# Patient Record
Sex: Female | Born: 1950
Health system: Southern US, Community
[De-identification: ages and names within clinical notes are randomized; demographics above are authoritative.]

## PROBLEM LIST (undated history)

## (undated) DIAGNOSIS — I10 Essential (primary) hypertension: Secondary | ICD-10-CM

## (undated) DIAGNOSIS — E785 Hyperlipidemia, unspecified: Secondary | ICD-10-CM

## (undated) DIAGNOSIS — E119 Type 2 diabetes mellitus without complications: Secondary | ICD-10-CM

## (undated) DIAGNOSIS — K219 Gastro-esophageal reflux disease without esophagitis: Secondary | ICD-10-CM

## (undated) DIAGNOSIS — T7840XA Allergy, unspecified, initial encounter: Secondary | ICD-10-CM

## (undated) DIAGNOSIS — J302 Other seasonal allergic rhinitis: Secondary | ICD-10-CM

## (undated) DIAGNOSIS — M1990 Unspecified osteoarthritis, unspecified site: Secondary | ICD-10-CM

## (undated) DIAGNOSIS — G589 Mononeuropathy, unspecified: Secondary | ICD-10-CM

## (undated) DIAGNOSIS — E669 Obesity, unspecified: Secondary | ICD-10-CM

## (undated) DIAGNOSIS — R29898 Other symptoms and signs involving the musculoskeletal system: Secondary | ICD-10-CM

## (undated) DIAGNOSIS — J189 Pneumonia, unspecified organism: Secondary | ICD-10-CM

## (undated) DIAGNOSIS — G4730 Sleep apnea, unspecified: Secondary | ICD-10-CM

## (undated) HISTORY — PX: BREAST SURGERY: SHX581

## (undated) HISTORY — PX: ABDOMINAL HYSTERECTOMY: SHX81

## (undated) HISTORY — DX: Essential (primary) hypertension: I10

## (undated) HISTORY — DX: Hyperlipidemia, unspecified: E78.5

## (undated) HISTORY — DX: Type 2 diabetes mellitus without complications: E11.9

## (undated) HISTORY — PX: OTHER SURGICAL HISTORY: SHX169

## (undated) HISTORY — DX: Obesity, unspecified: E66.9

## (undated) HISTORY — PX: TUBAL LIGATION: SHX77

## (undated) HISTORY — DX: Allergy, unspecified, initial encounter: T78.40XA

## (undated) HISTORY — PX: GANGLION CYST EXCISION: SHX1691

---

## 1995-12-28 DIAGNOSIS — T7840XA Allergy, unspecified, initial encounter: Secondary | ICD-10-CM

## 1995-12-28 HISTORY — DX: Allergy, unspecified, initial encounter: T78.40XA

## 2000-08-16 ENCOUNTER — Other Ambulatory Visit: Admission: RE | Admit: 2000-08-16 | Discharge: 2000-08-16 | Payer: Self-pay | Admitting: Family Medicine

## 2001-03-14 ENCOUNTER — Ambulatory Visit (HOSPITAL_COMMUNITY): Admission: RE | Admit: 2001-03-14 | Discharge: 2001-03-14 | Payer: Self-pay | Admitting: Cardiology

## 2001-03-14 ENCOUNTER — Encounter: Payer: Self-pay | Admitting: Cardiology

## 2001-11-21 ENCOUNTER — Other Ambulatory Visit: Admission: RE | Admit: 2001-11-21 | Discharge: 2001-11-21 | Payer: Self-pay | Admitting: Family Medicine

## 2003-03-26 ENCOUNTER — Other Ambulatory Visit: Admission: RE | Admit: 2003-03-26 | Discharge: 2003-03-26 | Payer: Self-pay | Admitting: Family Medicine

## 2011-04-20 ENCOUNTER — Other Ambulatory Visit: Payer: Self-pay | Admitting: Otolaryngology

## 2011-04-20 DIAGNOSIS — H547 Unspecified visual loss: Secondary | ICD-10-CM

## 2011-04-20 DIAGNOSIS — H9201 Otalgia, right ear: Secondary | ICD-10-CM

## 2011-04-20 DIAGNOSIS — R42 Dizziness and giddiness: Secondary | ICD-10-CM

## 2011-04-29 ENCOUNTER — Ambulatory Visit
Admission: RE | Admit: 2011-04-29 | Discharge: 2011-04-29 | Disposition: A | Discharge status: Home / Self Care | Payer: PRIVATE HEALTH INSURANCE | Source: Ambulatory Visit | Attending: Otolaryngology | Admitting: Otolaryngology

## 2011-04-29 DIAGNOSIS — H547 Unspecified visual loss: Secondary | ICD-10-CM

## 2011-04-29 DIAGNOSIS — R42 Dizziness and giddiness: Secondary | ICD-10-CM

## 2011-04-29 DIAGNOSIS — H9201 Otalgia, right ear: Secondary | ICD-10-CM

## 2011-04-29 MED ORDER — GADOBENATE DIMEGLUMINE 529 MG/ML IV SOLN
20.0000 mL | Freq: Once | INTRAVENOUS | Status: AC | PRN
  Administered 2011-04-29: 20 mL via INTRAVENOUS

## 2013-03-12 ENCOUNTER — Other Ambulatory Visit: Payer: Self-pay | Admitting: *Deleted

## 2013-03-12 DIAGNOSIS — Z780 Asymptomatic menopausal state: Secondary | ICD-10-CM

## 2013-04-05 ENCOUNTER — Encounter: Payer: Self-pay | Admitting: *Deleted

## 2013-05-04 ENCOUNTER — Ambulatory Visit (INDEPENDENT_AMBULATORY_CARE_PROVIDER_SITE_OTHER): Payer: 59 | Admitting: Nurse Practitioner

## 2013-05-04 ENCOUNTER — Encounter: Payer: Self-pay | Admitting: Nurse Practitioner

## 2013-05-04 VITALS — BP 113/76 | HR 74 | Temp 98.3°F | Ht 65.0 in | Wt 232.0 lb

## 2013-05-04 DIAGNOSIS — I10 Essential (primary) hypertension: Secondary | ICD-10-CM | POA: Insufficient documentation

## 2013-05-04 DIAGNOSIS — E785 Hyperlipidemia, unspecified: Secondary | ICD-10-CM

## 2013-05-04 DIAGNOSIS — L909 Atrophic disorder of skin, unspecified: Secondary | ICD-10-CM

## 2013-05-04 MED ORDER — DICLOFENAC SODIUM 75 MG PO TBEC: 75.0000 mg | DELAYED_RELEASE_TABLET | Freq: Two times a day (BID) | ORAL | Status: DC

## 2013-05-04 NOTE — Progress Notes (Signed)
  Subjective:    Patient ID: Bethany [name], female    DOB: 1951-12-07, 62 y.o.   MRN: [id number]  HPI- Patient here today for skin tag removal.One is in private parts and another on her breast. Patient noticed tags in December- One in groin area has gotten bigger an dgets caught in panty line.     Review of Systems  All other systems reviewed and are negative.       Objective:   Physical Exam  Genitourinary:     Skin tag left groin area   Skin:     Skin tag right aerolla    Procedure LIdo 1% with epi 1cc each area Skin tags shaved off Silver nitrate sticks for cauterization      Assessment & Plan:  removal of skin tag X2-- Right breat and Left groin  Keep areas clean and dry  Watch for signs of infection.   Arthritis  diclofenaxc as Rx  Mary-Margaret Daphine Deutscher, FNP

## 2013-05-23 ENCOUNTER — Other Ambulatory Visit: Payer: Self-pay

## 2013-05-23 ENCOUNTER — Ambulatory Visit: Payer: Self-pay

## 2013-06-09 ENCOUNTER — Other Ambulatory Visit: Payer: Self-pay | Admitting: *Deleted

## 2013-06-09 MED ORDER — MECLIZINE HCL 25 MG PO TABS: 25.0000 mg | ORAL_TABLET | Freq: Four times a day (QID) | ORAL | Status: DC | PRN

## 2013-06-20 ENCOUNTER — Ambulatory Visit (INDEPENDENT_AMBULATORY_CARE_PROVIDER_SITE_OTHER): Payer: 59

## 2013-06-20 ENCOUNTER — Ambulatory Visit (INDEPENDENT_AMBULATORY_CARE_PROVIDER_SITE_OTHER): Payer: 59 | Admitting: Pharmacist

## 2013-06-20 VITALS — Ht 65.0 in | Wt 234.0 lb

## 2013-06-20 DIAGNOSIS — Z13820 Encounter for screening for osteoporosis: Secondary | ICD-10-CM

## 2013-06-20 DIAGNOSIS — Z780 Asymptomatic menopausal state: Secondary | ICD-10-CM

## 2013-06-20 NOTE — Patient Instructions (Addendum)

## 2013-06-20 NOTE — Progress Notes (Signed)
Patient ID: Bethany [name], female   DOB: 15-Aug-1951, 62 y.o.   MRN: [id number] [facility] Current Height: Height: 5\' 5"  (165.1 cm)      Max Lifetime Height:  5\' 5"  Current Weight: Weight: 234 lb (106.142 kg)       Ethnicity:African American     HPI: Does pt already have a diagnosis of:  Osteopenia?  No Osteoporosis?  No  Back Pain?  Yes  - after accident 53.  Currently see chiropractor with good results    Kyphosis?  No Prior fracture?  No Med(s) for Osteoporosis/Osteopenia:  none Med(s) previously tried for Osteoporosis/Osteopenia:  none                                                             PMH: Age at menopause:  21's Hysterectomy?  Yes Oophorectomy?  No HRT? Yes - Former.  Type/duration: premarin - several years Steroid Use?  No Thyroid med?  No History of cancer?  No History of digestive disorders (ie Crohn's)?  No Current or previous eating disorders?  No Last Vitamin D Result:  24 (01/2013) Last GFR Result:  78 (01/2013)   FH/SH: Family history of osteoporosis?  No Parent with history of hip fracture?  No Family history of breast cancer?  No Exercise?  No Smoking?  No Alcohol?  No    Calcium Assessment Calcium Intake  # of servings/day  Calcium mg  Milk (8 oz) QOD  x  300  = 150  Yogurt (4 oz) 0 x  200 = 0  Cheese (1 oz) 1 x  200 = 200  Other Calcium sources   250mg   Ca supplement 0 = 0   Estimated calcium intake per day 600mg     DEXA Results Date of Test T-Score for AP Spine L1-L4 T-Score for Total Left Hip T-Score for Total Right Hip  06/20/2013 1.5 0.8 0.9  05/30/2006 0.6 1.2 1.1              Assessment: Normal BMD   Recommendations: 1.  Start  OTC calcium + vitamin D 600mg /800IU daily and continue current dietary calcium intake 2.  recommend weight bearing exercise - as able or after approved by PCP.  Patient to be evaluated for knee pain/osteoarthritis by Dr Modesto Charon 07/02/2013 4.  Counseled and educated about fall risk and  prevention.  Recheck DEXA:  3 years  Time spent counseling patient:  15 minutes

## 2013-07-02 ENCOUNTER — Encounter: Payer: Self-pay | Admitting: Family Medicine

## 2013-07-02 ENCOUNTER — Other Ambulatory Visit: Payer: 59

## 2013-07-02 ENCOUNTER — Ambulatory Visit (INDEPENDENT_AMBULATORY_CARE_PROVIDER_SITE_OTHER): Payer: 59 | Admitting: Family Medicine

## 2013-07-02 ENCOUNTER — Ambulatory Visit (INDEPENDENT_AMBULATORY_CARE_PROVIDER_SITE_OTHER): Payer: 59

## 2013-07-02 VITALS — BP 132/81 | HR 69 | Temp 98.1°F | Wt 236.4 lb

## 2013-07-02 DIAGNOSIS — M1990 Unspecified osteoarthritis, unspecified site: Secondary | ICD-10-CM

## 2013-07-02 DIAGNOSIS — I10 Essential (primary) hypertension: Secondary | ICD-10-CM

## 2013-07-02 DIAGNOSIS — M21169 Varus deformity, not elsewhere classified, unspecified knee: Secondary | ICD-10-CM

## 2013-07-02 DIAGNOSIS — E559 Vitamin D deficiency, unspecified: Secondary | ICD-10-CM

## 2013-07-02 DIAGNOSIS — M129 Arthropathy, unspecified: Secondary | ICD-10-CM

## 2013-07-02 DIAGNOSIS — E785 Hyperlipidemia, unspecified: Secondary | ICD-10-CM

## 2013-07-02 DIAGNOSIS — R682 Dry mouth, unspecified: Secondary | ICD-10-CM

## 2013-07-02 DIAGNOSIS — K117 Disturbances of salivary secretion: Secondary | ICD-10-CM

## 2013-07-02 DIAGNOSIS — R635 Abnormal weight gain: Secondary | ICD-10-CM

## 2013-07-02 DIAGNOSIS — H8110 Benign paroxysmal vertigo, unspecified ear: Secondary | ICD-10-CM

## 2013-07-02 DIAGNOSIS — E669 Obesity, unspecified: Secondary | ICD-10-CM | POA: Insufficient documentation

## 2013-07-02 LAB — COMPLETE METABOLIC PANEL WITH GFR
ALT: 20 U/L (ref 0–35)
AST: 23 U/L (ref 0–37)
Albumin: 4 g/dL (ref 3.5–5.2)
Alkaline Phosphatase: 73 U/L (ref 39–117)
BUN: 10 mg/dL (ref 6–23)
CO2: 27 mEq/L (ref 19–32)
Calcium: 9.2 mg/dL (ref 8.4–10.5)
Chloride: 108 mEq/L (ref 96–112)
Creat: 0.94 mg/dL (ref 0.50–1.10)
GFR, Est African American: 75 mL/min
GFR, Est Non African American: 65 mL/min
Glucose, Bld: 105 mg/dL — ABNORMAL HIGH (ref 70–99)
Potassium: 4.4 mEq/L (ref 3.5–5.3)
Sodium: 143 mEq/L (ref 135–145)
Total Bilirubin: 0.6 mg/dL (ref 0.3–1.2)
Total Protein: 6.9 g/dL (ref 6.0–8.3)

## 2013-07-02 MED ORDER — CELECOXIB 200 MG PO CAPS: 200.0000 mg | ORAL_CAPSULE | Freq: Every day | ORAL | Status: DC

## 2013-07-02 NOTE — Progress Notes (Signed)
Patient ID: Bethany [name], female   DOB: July 10, 1951, 62 y.o.   MRN: [id number] SUBJECTIVE: CC: Chief Complaint  Patient presents with  . Knee Pain    bilateral knee pain. states has osteoarthritis. can't tolerate diclofencac causes heartburn  . Medication Problem    pravastatitn causes dry mouth and corners in mouth crack open     HPI: Arthritits of the knees has been for years and causes indigestion. Having less smell and taste. More difficult.Dry mouth. Recurrent vertigo can frequentl. Last attack of vertigo was a month ago. No neuro deficit.  Patient is here for follow up of hyperlipidemia: denies Headache;denies Chest Pain;denies weakness;denies Shortness of Breath and orthopnea;denies Visual changes;denies palpitations;denies cough;denies pedal edema;denies symptoms of TIA or stroke;deniesClaudication symptoms. admits to Compliance with medications;  Problems with medications.Thinks the Pravastatin causes dry mouth.  Past Medical History  Diagnosis Date  . Hypertension   . Hyperlipidemia   . Allergy 1997    knees  . Obesity    Past Surgical History  Procedure Laterality Date  . Abdominal hysterectomy    . Tubal ligation    . Phlebectomy     History   Social History  . Marital Status: Married    Spouse Name: N/A    Number of Children: N/A  . Years of Education: N/A   Occupational History  . Not on file.   Social History Main Topics  . Smoking status: Never Smoker   . Smokeless tobacco: Not on file  . Alcohol Use: No  . Drug Use: No  . Sexually Active: Not Currently     Comment: husband not able to have  sex   Other Topics Concern  . Not on file   Social History Narrative  . No narrative on file   Family History  Problem Relation Age of Onset  . Heart disease Mother   . Diabetes Sister   . Arthritis Brother     knees  . Heart disease Sister     tumor in the heart   Current Outpatient Prescriptions on File Prior to Visit  Medication Sig Dispense  Refill  . acyclovir (ZOVIRAX) 200 MG capsule Take 200 mg by mouth daily as needed.      Marland Kitchen lisinopril-hydrochlorothiazide (PRINZIDE,ZESTORETIC) 20-25 MG per tablet Take 1 tablet by mouth daily.      . meclizine (ANTIVERT) 25 MG tablet Take 1 tablet (25 mg total) by mouth 4 (four) times daily as needed.  90 tablet  0  . pravastatin (PRAVACHOL) 20 MG tablet Take 20 mg by mouth daily.      . diclofenac (VOLTAREN) 75 MG EC tablet Take 1 tablet (75 mg total) by mouth 2 (two) times daily.  60 tablet  0   No current facility-administered medications on file prior to visit.   Allergies  Allergen Reactions  . Diclofenac     Causes heartburn     There is no immunization history on file for this patient. Prior to Admission medications   Medication Sig Start Date End Date Taking? Authorizing Provider  acyclovir (ZOVIRAX) 200 MG capsule Take 200 mg by mouth daily as needed.   Yes Historical Provider, MD  lisinopril-hydrochlorothiazide (PRINZIDE,ZESTORETIC) 20-25 MG per tablet Take 1 tablet by mouth daily.   Yes Historical Provider, MD  meclizine (ANTIVERT) 25 MG tablet Take 1 tablet (25 mg total) by mouth 4 (four) times daily as needed. 06/09/13  Yes Mary-Margaret Daphine Deutscher, FNP  pravastatin (PRAVACHOL) 20 MG tablet Take 20 mg by mouth daily.  Yes Historical Provider, MD  diclofenac (VOLTAREN) 75 MG EC tablet Take 1 tablet (75 mg total) by mouth 2 (two) times daily. 05/04/13   Mary-Margaret Daphine Deutscher, FNP     ROS: As above in the HPI. All other systems are stable or negative.  OBJECTIVE: APPEARANCE:  Patient in no acute distress.The patient appeared well nourished and normally developed. Acyanotic. Waist:46 inches VITAL SIGNS:BP 132/81  Pulse 69  Temp(Src) 98.1 F (36.7 C) (Oral)  Wt 236 lb 6.4 oz (107.23 kg)  BMI 39.34 kg/m2 Obese AAF  SKIN: warm and  Dry without overt rashes, tattoos and scars  HEAD and Neck: without JVD, Head and scalp: normal Eyes:No scleral icterus. Fundi normal, eye  movements normal. Ears: Auricle normal, canal normal, Tympanic membranes normal, insufflation normal. Nose: normal Throat: normal Neck & thyroid: normal  CHEST & LUNGS: Chest wall: normal Lungs: Clear  CVS: Reveals the PMI to be normally located. Regular rhythm, First and Second Heart sounds are normal,  absence of murmurs, rubs or gallops. Peripheral vasculature: Radial pulses: normal Dorsal pedis pulses: normal Posterior pulses: normal  ABDOMEN:  Appearance: normal Benign, no organomegaly, no masses, no Abdominal Aortic enlargement. No Guarding , no rebound. No Bruits. Bowel sounds: normal  RECTAL: N/A GU: N/A  EXTREMETIES: nonedematous.   MUSCULOSKELETAL:  Spine: normal Joints:Bilateral crepitus of knees with mild pain on ROM. Pain on weight bearing Genu Varus/Bow legs.  NEUROLOGIC: oriented to time,place and person; nonfocal. Strength is normal Sensory is normal Reflexes are normal Cranial Nerves are normal.  ASSESSMENT: Arthritis - Knees - Plan: Arthritis Panel, celecoxib (CELEBREX) 200 MG capsule, Ambulatory referral to Orthopedic Surgery, DG Knee 1-2 Views Left, DG Knee 1-2 Views Right  Obesity, unspecified  Hypertension - Plan: COMPLETE METABOLIC PANEL WITH GFR  Hyperlipidemia - Plan: COMPLETE METABOLIC PANEL WITH GFR, NMR Lipoprofile with Lipids  Mouth dryness  Genu varus, unspecified laterality  Unspecified vitamin D deficiency - Plan: Vitamin D 25 hydroxy  Benign paroxysmal positional vertigo - Plan: Ambulatory referral to ENT  Suspect the dry mouth is due to the meclizine. Will hold off changing the statin for now. The Genu Varus and the obesity has played a major input on the arthritis.   PLAN:      Dr Woodroe Mode Recommendations  Diet and Exercise discussed with patient.  For nutrition information, I recommend books:  1).Eat to Live by Dr Monico Hoar. 2).Prevent and Reverse Heart Disease by Dr Suzzette Righter. 3) Dr Katherina Right Book:  Program to Reverse Diabetes  Exercise recommendations are:  If unable to walk, then the patient can exercise in a chair 3 times a day. By flapping arms like a bird gently and raising legs outwards to the front.  If ambulatory, the patient can go for walks for 30 minutes 3 times a week. Then increase the intensity and duration as tolerated.  Goal is to try to attain exercise frequency to 5 times a week.  If applicable: Best to perform resistance exercises (machines or weights) 2 days a week and cardio type exercises 3 days per week.   Weight reduction and exercise ina  Chair discussed.   [facility] reading (PRIMARY) by  Dr. Modesto Charon: Bilateral knee Xrays:severe degenerative  Arthritis with loss of the medial compartments bilaterally. Await official overrread.  Orders Placed This Encounter  Procedures  . DG Knee 1-2 Views Left    Standing Status: Future     Number of Occurrences:      Standing Expiration Date: 09/01/2014  Order Specific Question:  Reason for Exam (SYMPTOM  OR DIAGNOSIS REQUIRED)    Answer:  knee pains    Order Specific Question:  Preferred imaging location?    Answer:  Internal  . DG Knee 1-2 Views Right    Standing Status: Future     Number of Occurrences:      Standing Expiration Date: 09/01/2014    Order Specific Question:  Reason for Exam (SYMPTOM  OR DIAGNOSIS REQUIRED)    Answer:  knee pains    Order Specific Question:  Preferred imaging location?    Answer:  Internal  . Arthritis Panel  . COMPLETE METABOLIC PANEL WITH GFR  . NMR Lipoprofile with Lipids  . Vitamin D 25 hydroxy  . Ambulatory referral to Orthopedic Surgery    Referral Priority:  Routine    Referral Type:  Surgical    Referral Reason:  Specialty Services Required    Requested Specialty:  Orthopedic Surgery    Number of Visits Requested:  1  . Ambulatory referral to ENT    Referral Priority:  Routine    Referral Type:  Consultation    Referral Reason:  Specialty Services Required     Requested Specialty:  Otolaryngology    Number of Visits Requested:  1    Meds ordered this encounter  Medications  . celecoxib (CELEBREX) 200 MG capsule    Sig: Take 1 capsule (200 mg total) by mouth daily.    Dispense:  30 capsule    Refill:  2   Return in about 6 weeks (around 08/13/2013) for recheck BP, Recheck medical problems.  Marcellus Pulliam P. Modesto Charon, M.D.

## 2013-07-02 NOTE — Patient Instructions (Signed)
      Dr Julieta Rogalski's Recommendations  Diet and Exercise discussed with patient.  For nutrition information, I recommend books:  1).Eat to Live by Dr Joel Fuhrman. 2).Prevent and Reverse Heart Disease by Dr Caldwell Esselstyn. 3) Dr Neal Barnard's Book:  Program to Reverse Diabetes  Exercise recommendations are:  If unable to walk, then the patient can exercise in a chair 3 times a day. By flapping arms like a bird gently and raising legs outwards to the front.  If ambulatory, the patient can go for walks for 30 minutes 3 times a week. Then increase the intensity and duration as tolerated.  Goal is to try to attain exercise frequency to 5 times a week.  If applicable: Best to perform resistance exercises (machines or weights) 2 days a week and cardio type exercises 3 days per week.  

## 2013-07-03 ENCOUNTER — Other Ambulatory Visit: Payer: Self-pay | Admitting: Family Medicine

## 2013-07-03 DIAGNOSIS — E785 Hyperlipidemia, unspecified: Secondary | ICD-10-CM

## 2013-07-03 LAB — NMR LIPOPROFILE WITH LIPIDS
Cholesterol, Total: 180 mg/dL (ref <200)
HDL Particle Number: 30.9 umol/L (ref >=30.5)
HDL Size: 8.9 nm — ABNORMAL LOW (ref >=9.2)
HDL-C: 40 mg/dL (ref >=40)
LDL (calc): 119 mg/dL — ABNORMAL HIGH (ref <100)
LDL Particle Number: 1601 nmol/L — ABNORMAL HIGH (ref <1000)
LDL Size: 20 nm — ABNORMAL LOW (ref >20.5)
LP-IR Score: 69 — ABNORMAL HIGH (ref <=45)
Large HDL-P: 3 umol/L — ABNORMAL LOW (ref >=4.8)
Large VLDL-P: 3.8 nmol/L — ABNORMAL HIGH (ref <=2.7)
Small LDL Particle Number: 1027 nmol/L — ABNORMAL HIGH (ref <=527)
Triglycerides: 105 mg/dL (ref <150)
VLDL Size: 52.9 nm — ABNORMAL HIGH (ref <=46.6)

## 2013-07-03 LAB — ARTHRITIS PANEL
Anti Nuclear Antibody(ANA): POSITIVE — AB
Rhuematoid fact SerPl-aCnc: <10 IU/mL (ref <=14)
Sed Rate: 4 mm/hr (ref 0–22)
Uric Acid, Serum: 4.6 mg/dL (ref 2.4–6.0)

## 2013-07-03 LAB — ANTI-NUCLEAR AB-TITER (ANA TITER): ANA Titer 1: NEGATIVE

## 2013-07-03 LAB — VITAMIN D 25 HYDROXY (VIT D DEFICIENCY, FRACTURES): Vit D, 25-Hydroxy: 47 ng/mL (ref 30–89)

## 2013-07-03 MED ORDER — PRAVASTATIN SODIUM 40 MG PO TABS: 40.0000 mg | ORAL_TABLET | Freq: Every day | ORAL | Status: DC

## 2013-07-03 NOTE — Progress Notes (Signed)
Quick Note:  Labs abnormal. The Xrays of the knees are showing very advanced arthritis. See the orthopedist as we had planned and the referral was made. Thanks.  ______

## 2013-07-04 ENCOUNTER — Encounter: Payer: Self-pay | Admitting: *Deleted

## 2013-07-25 ENCOUNTER — Ambulatory Visit: Payer: 59 | Admitting: Family Medicine

## 2013-08-15 ENCOUNTER — Ambulatory Visit: Payer: 59 | Admitting: Family Medicine

## 2013-08-17 ENCOUNTER — Ambulatory Visit (INDEPENDENT_AMBULATORY_CARE_PROVIDER_SITE_OTHER): Payer: 59 | Admitting: Family Medicine

## 2013-08-17 ENCOUNTER — Encounter: Payer: Self-pay | Admitting: Family Medicine

## 2013-08-17 VITALS — BP 126/89 | HR 70 | Temp 98.4°F | Wt 236.4 lb

## 2013-08-17 DIAGNOSIS — I10 Essential (primary) hypertension: Secondary | ICD-10-CM

## 2013-08-17 DIAGNOSIS — M1990 Unspecified osteoarthritis, unspecified site: Secondary | ICD-10-CM

## 2013-08-17 DIAGNOSIS — R635 Abnormal weight gain: Secondary | ICD-10-CM

## 2013-08-17 DIAGNOSIS — E785 Hyperlipidemia, unspecified: Secondary | ICD-10-CM

## 2013-08-17 DIAGNOSIS — E669 Obesity, unspecified: Secondary | ICD-10-CM

## 2013-08-17 DIAGNOSIS — M129 Arthropathy, unspecified: Secondary | ICD-10-CM

## 2013-08-17 NOTE — Progress Notes (Signed)
Patient ID: Bethany [name], female   DOB: September 25, 1951, 62 y.o.   MRN: [id number] SUBJECTIVE: CC: Chief Complaint  Patient presents with  . Follow-up    follow up bilateral knee pain to habe surgery bilaterl knee on oct 27. by dr Despina Hick    HPI:  Patient is here for follow up of hyperlipidemia/hypertension/ arthritis of the knees. denies Headache;denies Chest Pain;denies weakness;denies Shortness of Breath and orthopnea;denies Visual changes;denies palpitations;denies cough;denies pedal edema;denies symptoms of TIA or stroke;deniesClaudication symptoms. admits to Compliance with medications; denies Problems with medications.  celebrex helps a lot.   Scheduled for TKRs in October.  Past Medical History  Diagnosis Date  . Hypertension   . Hyperlipidemia   . Allergy 1997    knees  . Obesity    Past Surgical History  Procedure Laterality Date  . Abdominal hysterectomy    . Tubal ligation    . Phlebectomy     History   Social History  . Marital Status: Married    Spouse Name: N/A    Number of Children: N/A  . Years of Education: N/A   Occupational History  . Not on file.   Social History Main Topics  . Smoking status: Never Smoker   . Smokeless tobacco: Not on file  . Alcohol Use: No  . Drug Use: No  . Sexual Activity: Not Currently     Comment: husband not able to have  sex   Other Topics Concern  . Not on file   Social History Narrative  . No narrative on file   Family History  Problem Relation Age of Onset  . Heart disease Mother   . Diabetes Sister   . Arthritis Brother     knees  . Heart disease Sister     tumor in the heart   Current Outpatient Prescriptions on File Prior to Visit  Medication Sig Dispense Refill  . acyclovir (ZOVIRAX) 200 MG capsule Take 200 mg by mouth daily as needed.      . celecoxib (CELEBREX) 200 MG capsule Take 1 capsule (200 mg total) by mouth daily.  30 capsule  2  . lisinopril-hydrochlorothiazide (PRINZIDE,ZESTORETIC) 20-25  MG per tablet Take 1 tablet by mouth daily.      . meclizine (ANTIVERT) 25 MG tablet Take 1 tablet (25 mg total) by mouth 4 (four) times daily as needed.  90 tablet  0  . pravastatin (PRAVACHOL) 40 MG tablet Take 1 tablet (40 mg total) by mouth daily.  90 tablet  3   No current facility-administered medications on file prior to visit.   Allergies  Allergen Reactions  . Diclofenac     Causes heartburn     There is no immunization history on file for this patient. Prior to Admission medications   Medication Sig Start Date End Date Taking? Authorizing Provider  acyclovir (ZOVIRAX) 200 MG capsule Take 200 mg by mouth daily as needed.    Historical Provider, MD  celecoxib (CELEBREX) 200 MG capsule Take 1 capsule (200 mg total) by mouth daily. 07/02/13   Ileana Ladd, MD  lisinopril-hydrochlorothiazide (PRINZIDE,ZESTORETIC) 20-25 MG per tablet Take 1 tablet by mouth daily.    Historical Provider, MD  meclizine (ANTIVERT) 25 MG tablet Take 1 tablet (25 mg total) by mouth 4 (four) times daily as needed. 06/09/13   Mary-Margaret Daphine Deutscher, FNP  pravastatin (PRAVACHOL) 40 MG tablet Take 1 tablet (40 mg total) by mouth daily. 07/03/13   Ileana Ladd, MD  ROS: As above in the HPI. All other systems are stable or negative.  OBJECTIVE: APPEARANCE:  Patient in no acute distress.The patient appeared well nourished and normally developed. Acyanotic. Waist: VITAL SIGNS:BP 126/89  Pulse 70  Temp(Src) 98.4 F (36.9 C) (Oral)  Wt 236 lb 6.4 oz (107.23 kg)  BMI 39.34 kg/m2 AAF obese  SKIN: warm and  Dry without overt rashes, tattoos and scars  HEAD and Neck: without JVD, Head and scalp: normal Eyes:No scleral icterus. Fundi normal, eye movements normal. Ears: Auricle normal, canal normal, Tympanic membranes normal, insufflation normal. Nose: normal Throat: normal Neck & thyroid: normal  CHEST & LUNGS: Chest wall: normal Lungs: Clear  CVS: Reveals the PMI to be normally  located. Regular rhythm, First and Second Heart sounds are normal,  absence of murmurs, rubs or gallops. Peripheral vasculature: Radial pulses: normal Dorsal pedis pulses: normal Posterior pulses: normal  ABDOMEN:  Appearance: Obese Benign, no organomegaly, no masses, no Abdominal Aortic enlargement. No Guarding , no rebound. No Bruits. Bowel sounds: normal  RECTAL: N/A GU: N/A  EXTREMETIES: nonedematous.  MUSCULOSKELETAL:  Spine: normal Joints: bilateral knee crepitus and reduced ROM. Patient limps to walk  NEUROLOGIC: oriented to time,place and person; nonfocal. Strength is normal Sensory is normal Reflexes are normal Cranial Nerves are normal.  Results for orders placed in visit on 07/02/13  ARTHRITIS PANEL      Result Value Range   Sed Rate 4  0 - 22 mm/hr   Uric Acid, Serum 4.6  2.4 - 6.0 mg/dL   Rheumatoid Factor <45  <=14 IU/mL   ANA POS (*) NEGATIVE  COMPLETE METABOLIC PANEL WITH GFR      Result Value Range   Sodium 143  135 - 145 mEq/L   Potassium 4.4  3.5 - 5.3 mEq/L   Chloride 108  96 - 112 mEq/L   CO2 27  19 - 32 mEq/L   Glucose, Bld 105 (*) 70 - 99 mg/dL   BUN 10  6 - 23 mg/dL   Creat 4.09  8.11 - 9.14 mg/dL   Total Bilirubin 0.6  0.3 - 1.2 mg/dL   Alkaline Phosphatase 73  39 - 117 U/L   AST 23  0 - 37 U/L   ALT 20  0 - 35 U/L   Total Protein 6.9  6.0 - 8.3 g/dL   Albumin 4.0  3.5 - 5.2 g/dL   Calcium 9.2  8.4 - 78.2 mg/dL   GFR, Est African American 75     GFR, Est Non African American 65    NMR LIPOPROFILE WITH LIPIDS      Result Value Range   LDL Particle Number [id number] (*) <1000 nmol/L   LDL (calc) 119 (*) <100 mg/dL   HDL-C 40  >=95 mg/dL   Triglycerides 621  <308 mg/dL   Cholesterol, Total 657  <200 mg/dL   HDL Particle Number 84.6  >=96.2 umol/L   Large HDL-P 3.0 (*) >=4.8 umol/L   Large VLDL-P 3.8 (*) <=2.7 nmol/L   Small LDL Particle Number [id number] (*) <=527 nmol/L   LDL Size 20.0 (*) >20.5 nm   HDL Size 8.9 (*) >=9.2 nm   VLDL Size  52.9 (*) <=46.6 nm   LP-IR Score 69 (*) <=45  VITAMIN D 25 HYDROXY      Result Value Range   Vit D, 25-Hydroxy 47  30 - 89 ng/mL  ANTI-NUCLEAR AB-TITER (ANA TITER)      Result Value Range   ANA Titer  1 NEG  <1:40     ANA Pattern 1        ASSESSMENT: Hyperlipidemia  Hypertension - Plan: CMP14+EGFR  Obesity, unspecified  Arthritis - Plan: CMP14+EGFR  Better with celebrex. Tolerating the statin without problems.  PLAN: Orders Placed This Encounter  Procedures  . CMP14+EGFR   Patient doing better.  Patient to start on the Eat to Live program.  Return in about 4 weeks (around 09/14/2013) for pre - op clearance exam.  Anvita Hirata P. Modesto Charon, M.D.

## 2013-08-18 LAB — CMP14+EGFR
ALT: 12 IU/L (ref 0–32)
AST: 18 IU/L (ref 0–40)
Albumin/Globulin Ratio: 1.5 (ref 1.1–2.5)
Albumin: 4.1 g/dL (ref 3.6–4.8)
Alkaline Phosphatase: 76 IU/L (ref 39–117)
BUN/Creatinine Ratio: 11 (ref 11–26)
BUN: 9 mg/dL (ref 8–27)
CO2: 24 mmol/L (ref 18–29)
Calcium: 9.9 mg/dL (ref 8.6–10.2)
Chloride: 104 mmol/L (ref 97–108)
Creatinine, Ser: 0.81 mg/dL (ref 0.57–1.00)
GFR calc Af Amer: 89 mL/min/{1.73_m2} (ref >59)
GFR calc non Af Amer: 78 mL/min/{1.73_m2} (ref >59)
Globulin, Total: 2.7 g/dL (ref 1.5–4.5)
Glucose: 79 mg/dL (ref 65–99)
Potassium: 4.3 mmol/L (ref 3.5–5.2)
Sodium: 141 mmol/L (ref 134–144)
Total Bilirubin: 0.6 mg/dL (ref 0.0–1.2)
Total Protein: 6.8 g/dL (ref 6.0–8.5)

## 2013-08-20 NOTE — Progress Notes (Signed)
Quick Note:  Call patient. Labs normal. No change in plan. ______ 

## 2013-09-11 ENCOUNTER — Other Ambulatory Visit: Payer: Self-pay

## 2013-09-11 NOTE — Telephone Encounter (Signed)
Last seen 08/17/13  [facility]  Diclofenac not on EPIC list

## 2013-09-12 NOTE — Telephone Encounter (Signed)
Was Rx celebrex. Not on our med list in EPIC. Where does she get this? Who Rx it?

## 2013-09-13 NOTE — Telephone Encounter (Signed)
Pt notified and has appt tomorrow  09-14-13

## 2013-09-14 ENCOUNTER — Ambulatory Visit (INDEPENDENT_AMBULATORY_CARE_PROVIDER_SITE_OTHER): Payer: 59 | Admitting: Family Medicine

## 2013-09-14 ENCOUNTER — Ambulatory Visit (INDEPENDENT_AMBULATORY_CARE_PROVIDER_SITE_OTHER): Payer: 59

## 2013-09-14 ENCOUNTER — Encounter: Payer: Self-pay | Admitting: Family Medicine

## 2013-09-14 VITALS — BP 137/85 | HR 71 | Temp 97.6°F | Wt 235.8 lb

## 2013-09-14 DIAGNOSIS — I10 Essential (primary) hypertension: Secondary | ICD-10-CM

## 2013-09-14 DIAGNOSIS — M25579 Pain in unspecified ankle and joints of unspecified foot: Secondary | ICD-10-CM

## 2013-09-14 DIAGNOSIS — M25572 Pain in left ankle and joints of left foot: Secondary | ICD-10-CM

## 2013-09-14 DIAGNOSIS — M1990 Unspecified osteoarthritis, unspecified site: Secondary | ICD-10-CM

## 2013-09-14 DIAGNOSIS — R635 Abnormal weight gain: Secondary | ICD-10-CM

## 2013-09-14 DIAGNOSIS — R42 Dizziness and giddiness: Secondary | ICD-10-CM

## 2013-09-14 DIAGNOSIS — E785 Hyperlipidemia, unspecified: Secondary | ICD-10-CM

## 2013-09-14 DIAGNOSIS — M129 Arthropathy, unspecified: Secondary | ICD-10-CM

## 2013-09-14 DIAGNOSIS — E669 Obesity, unspecified: Secondary | ICD-10-CM

## 2013-09-14 DIAGNOSIS — M21169 Varus deformity, not elsewhere classified, unspecified knee: Secondary | ICD-10-CM

## 2013-09-14 DIAGNOSIS — H8110 Benign paroxysmal vertigo, unspecified ear: Secondary | ICD-10-CM

## 2013-09-14 MED ORDER — MECLIZINE HCL 25 MG PO TABS: 25.0000 mg | ORAL_TABLET | Freq: Four times a day (QID) | ORAL | Status: DC | PRN

## 2013-09-14 NOTE — Progress Notes (Signed)
Patient ID: Bethany [name], female   DOB: [birth year], 62 y.o.   MRN: [id number] SUBJECTIVE: CC: Chief Complaint  Patient presents with  . Follow-up    inner ear trouble . pulling in rt groin area top left foot has knot that is painful  . Medication Refill    can't afford celebrex     HPI: Patient is here for follow up of hyperlipidemia/htn/: denies Headache;denies Chest Pain;denies weakness;denies Shortness of Breath and orthopnea;denies Visual changes;denies palpitations;denies cough;denies pedal edema;denies symptoms of TIA or stroke;deniesClaudication symptoms. admits to Compliance with medications; denies Problems with medications.  Tinnitus and dizziness.flare up of her vertigo  Left Foot flared up of arthritis with a swelling over the fourth Tarso metatarsal joint.  Past Medical History  Diagnosis Date  . Hypertension   . Hyperlipidemia   . Allergy 1997    knees  . Obesity    Past Surgical History  Procedure Laterality Date  . Abdominal hysterectomy    . Tubal ligation    . Phlebectomy     History   Social History  . Marital Status: Married    Spouse Name: N/A    Number of Children: N/A  . Years of Education: N/A   Occupational History  . Not on file.   Social History Main Topics  . Smoking status: Never Smoker   . Smokeless tobacco: Not on file  . Alcohol Use: No  . Drug Use: No  . Sexual Activity: Not Currently     Comment: husband not able to have  sex   Other Topics Concern  . Not on file   Social History Narrative  . No narrative on file   Family History  Problem Relation Age of Onset  . Heart disease Mother   . Diabetes Sister   . Arthritis Brother     knees  . Heart disease Sister     tumor in the heart   Current Outpatient Prescriptions on File Prior to Visit  Medication Sig Dispense Refill  . acyclovir (ZOVIRAX) 200 MG capsule Take 200 mg by mouth daily as needed.      . celecoxib (CELEBREX) 200 MG capsule Take 1 capsule (200 mg  total) by mouth daily.  30 capsule  2  . lisinopril-hydrochlorothiazide (PRINZIDE,ZESTORETIC) 20-25 MG per tablet Take 1 tablet by mouth daily.      . meclizine (ANTIVERT) 25 MG tablet Take 1 tablet (25 mg total) by mouth 4 (four) times daily as needed.  90 tablet  0  . pravastatin (PRAVACHOL) 40 MG tablet Take 1 tablet (40 mg total) by mouth daily.  90 tablet  3   No current facility-administered medications on file prior to visit.   Allergies  Allergen Reactions  . Diclofenac     Causes heartburn     There is no immunization history on file for this patient. Prior to Admission medications   Medication Sig Start Date End Date Taking? Authorizing Provider  acyclovir (ZOVIRAX) 200 MG capsule Take 200 mg by mouth daily as needed.   Yes Historical Provider, MD  celecoxib (CELEBREX) 200 MG capsule Take 1 capsule (200 mg total) by mouth daily. 07/02/13  Yes Ileana Ladd, MD  lisinopril-hydrochlorothiazide (PRINZIDE,ZESTORETIC) 20-25 MG per tablet Take 1 tablet by mouth daily.   Yes Historical Provider, MD  meclizine (ANTIVERT) 25 MG tablet Take 1 tablet (25 mg total) by mouth 4 (four) times daily as needed. 06/09/13  Yes Mary-Margaret Daphine Deutscher, FNP  pravastatin (PRAVACHOL) 40 MG tablet Take 1  tablet (40 mg total) by mouth daily. 07/03/13  Yes Ileana Ladd, MD    ROS: As above in the HPI. All other systems are stable or negative.  OBJECTIVE: APPEARANCE:  Patient in no acute distress.The patient appeared well nourished and normally developed. Acyanotic. Waist: VITAL SIGNS:BP 137/85  Pulse 71  Temp(Src) 97.6 F (36.4 C) (Oral)  Wt 235 lb 12.8 oz (106.958 kg)  BMI 39.24 kg/m2 AAF obese  SKIN: warm and  Dry without overt rashes, tattoos and scars  HEAD and Neck: without JVD, Head and scalp: normal Eyes:No scleral icterus. Fundi normal, eye movements normal. Ears: Auricle normal, canal normal, Tympanic membranes normal, insufflation normal. Nose: normal Throat: normal Neck &  thyroid: normal  CHEST & LUNGS: Chest wall: normal Lungs: Clear  CVS: Reveals the PMI to be normally located. Regular rhythm, First and Second Heart sounds are normal,  absence of murmurs, rubs or gallops. Peripheral vasculature: Radial pulses: normal Dorsal pedis pulses: normal Posterior pulses: normal  ABDOMEN:  Appearance: obese Benign, no organomegaly, no masses, no Abdominal Aortic enlargement. No Guarding , no rebound. No Bruits. Bowel sounds: normal  RECTAL: N/A GU: N/A  EXTREMETIES: nonedematous.  MUSCULOSKELETAL:  Spine: normal Joints: 4th left tarso-metatarsal joint tender and swollen. Knees crepitus and reduced ROM due to arthritis advanced clinically.  NEUROLOGIC: oriented to time,place and person; nonfocal. Strength is normal Sensory is normal Reflexes are normal Cranial Nerves are normal.  ASSESSMENT:  PLAN:  Orders Placed This Encounter  Procedures  . DG Foot Complete Left    Standing Status: Future     Number of Occurrences: 1     Standing Expiration Date: 11/14/2014    Order Specific Question:  Reason for Exam (SYMPTOM  OR DIAGNOSIS REQUIRED)    Answer:  swelling and pain left foot    Order Specific Question:  Preferred imaging location?    Answer:  Internal  . EKG 12-Lead    Standing Status: Standing     Number of Occurrences: 1     Standing Expiration Date:     Order Specific Question:  Reason for Exam    Answer:  hypertension   EKG WNL, no change from previous.  [facility] reading (PRIMARY) by  Dr. Modesto Charon: no acute findings. Chronic changes.                           Meds ordered this encounter  Medications  . meclizine (ANTIVERT) 25 MG tablet    Sig: Take 1 tablet (25 mg total) by mouth 4 (four) times daily as needed.    Dispense:  90 tablet    Refill:  0   Results for orders placed in visit on 08/17/13  CMP14+EGFR      Result Value Range   Glucose 79  65 - 99 mg/dL   BUN 9  8 - 27 mg/dL   Creatinine, Ser 1.61  0.57 - 1.00 mg/dL    GFR calc non Af Amer 78  >59 mL/min/1.73   GFR calc Af Amer 89  >59 mL/min/1.73   BUN/Creatinine Ratio 11  11 - 26   Sodium 141  134 - 144 mmol/L   Potassium 4.3  3.5 - 5.2 mmol/L   Chloride 104  97 - 108 mmol/L   CO2 24  18 - 29 mmol/L   Calcium 9.9  8.6 - 10.2 mg/dL   Total Protein 6.8  6.0 - 8.5 g/dL   Albumin 4.1  3.6 -  4.8 g/dL   Globulin, Total 2.7  1.5 - 4.5 g/dL   Albumin/Globulin Ratio 1.5  1.1 - 2.5   Total Bilirubin 0.6  0.0 - 1.2 mg/dL   Alkaline Phosphatase 76  39 - 117 IU/L   AST 18  0 - 40 IU/L   ALT 12  0 - 32 IU/L   Filled form to clear for surgery. Continue present regimen.  Return in about 3 months (around 12/14/2013) for Recheck medical problems.  Goldia Ligman P. Modesto Charon, M.D.

## 2013-09-27 ENCOUNTER — Other Ambulatory Visit: Payer: Self-pay | Admitting: Orthopedic Surgery

## 2013-10-04 ENCOUNTER — Other Ambulatory Visit: Payer: Self-pay | Admitting: Orthopedic Surgery

## 2013-10-11 ENCOUNTER — Encounter (HOSPITAL_COMMUNITY): Payer: Self-pay | Admitting: Pharmacy Technician

## 2013-10-15 NOTE — Patient Instructions (Signed)
20 Bethany [name]  10/15/2013   Your procedure is scheduled on: 10/22/13  Report to [facility] at 8:35 AM.  Call this number if you have problems the morning of surgery 336-: [phone]   Remember:   Do not eat food or drink liquids After Midnight.     Take these medicines the morning of surgery with A SIP OF WATER: pravastatin   Do not wear jewelry, make-up or nail polish.  Do not wear lotions, powders, or perfumes. You may wear deodorant.  Do not shave 48 hours prior to surgery. Men may shave face and neck.  Do not bring valuables to the hospital.  Contacts, dentures or bridgework may not be worn into surgery.  Leave suitcase in the car. After surgery it may be brought to your room.  For patients admitted to the hospital, checkout time is 11:00 AM the day of discharge.   Please read over the following fact sheets that you were given: MRSA Information, incentive spirometry fact sheet, blood fact sheet Birdie Sons, RN  pre op nurse call if needed [phone]    FAILURE TO FOLLOW THESE INSTRUCTIONS MAY RESULT IN CANCELLATION OF YOUR SURGERY   Patient Signature: ___________________________________________

## 2013-10-15 NOTE — Progress Notes (Signed)
Surgery clearance note 09/14/13 Dr. Modesto Charon on chart, EKG 09/14/13 on chart

## 2013-10-16 ENCOUNTER — Other Ambulatory Visit: Payer: Self-pay | Admitting: *Deleted

## 2013-10-16 ENCOUNTER — Encounter (HOSPITAL_COMMUNITY)
Admission: RE | Admit: 2013-10-16 | Discharge: 2013-10-16 | Disposition: A | Discharge status: Home / Self Care | Payer: PRIVATE HEALTH INSURANCE | Source: Ambulatory Visit | Attending: Orthopedic Surgery | Admitting: Orthopedic Surgery

## 2013-10-16 ENCOUNTER — Ambulatory Visit (HOSPITAL_COMMUNITY)
Admission: RE | Admit: 2013-10-16 | Discharge: 2013-10-16 | Disposition: A | Discharge status: Home / Self Care | Payer: PRIVATE HEALTH INSURANCE | Source: Ambulatory Visit | Attending: Orthopedic Surgery | Admitting: Orthopedic Surgery

## 2013-10-16 ENCOUNTER — Encounter (HOSPITAL_COMMUNITY): Payer: Self-pay

## 2013-10-16 DIAGNOSIS — G4733 Obstructive sleep apnea (adult) (pediatric): Secondary | ICD-10-CM

## 2013-10-16 DIAGNOSIS — Z01810 Encounter for preprocedural cardiovascular examination: Secondary | ICD-10-CM | POA: Insufficient documentation

## 2013-10-16 DIAGNOSIS — R9431 Abnormal electrocardiogram [ECG] [EKG]: Secondary | ICD-10-CM | POA: Insufficient documentation

## 2013-10-16 DIAGNOSIS — M1710 Unilateral primary osteoarthritis, unspecified knee: Secondary | ICD-10-CM | POA: Insufficient documentation

## 2013-10-16 DIAGNOSIS — Z01812 Encounter for preprocedural laboratory examination: Secondary | ICD-10-CM | POA: Insufficient documentation

## 2013-10-16 DIAGNOSIS — I771 Stricture of artery: Secondary | ICD-10-CM | POA: Insufficient documentation

## 2013-10-16 DIAGNOSIS — Z01818 Encounter for other preprocedural examination: Secondary | ICD-10-CM | POA: Insufficient documentation

## 2013-10-16 HISTORY — DX: Unspecified osteoarthritis, unspecified site: M19.90

## 2013-10-16 HISTORY — DX: Other seasonal allergic rhinitis: J30.2

## 2013-10-16 HISTORY — DX: Gastro-esophageal reflux disease without esophagitis: K21.9

## 2013-10-16 HISTORY — DX: Pneumonia, unspecified organism: J18.9

## 2013-10-16 LAB — APTT: aPTT: 38 seconds — ABNORMAL HIGH (ref 24–37)

## 2013-10-16 LAB — COMPREHENSIVE METABOLIC PANEL
AST: 27 U/L (ref 0–37)
Albumin: 3.8 g/dL (ref 3.5–5.2)
Alkaline Phosphatase: 82 U/L (ref 39–117)
BUN: 11 mg/dL (ref 6–23)
Chloride: 102 mEq/L (ref 96–112)
GFR calc Af Amer: 79 mL/min — ABNORMAL LOW (ref >90)
GFR calc non Af Amer: 68 mL/min — ABNORMAL LOW (ref >90)
Potassium: 3.5 mEq/L (ref 3.5–5.1)
Total Bilirubin: 0.9 mg/dL (ref 0.3–1.2)
Total Protein: 7.7 g/dL (ref 6.0–8.3)

## 2013-10-16 LAB — CBC
HCT: 36.1 % (ref 36.0–46.0)
MCH: 28.1 pg (ref 26.0–34.0)
MCHC: 34.1 g/dL (ref 30.0–36.0)
Platelets: 287 10*3/uL (ref 150–400)
RDW: 14.2 % (ref 11.5–15.5)
WBC: 11.2 10*3/uL — ABNORMAL HIGH (ref 4.0–10.5)

## 2013-10-16 LAB — URINALYSIS, ROUTINE W REFLEX MICROSCOPIC
Bilirubin Urine: NEGATIVE
Hgb urine dipstick: NEGATIVE
Ketones, ur: NEGATIVE mg/dL
Leukocytes, UA: NEGATIVE
Nitrite: NEGATIVE
Specific Gravity, Urine: 1.014 (ref 1.005–1.030)
Urobilinogen, UA: 1 mg/dL (ref 0.0–1.0)

## 2013-10-16 LAB — SURGICAL PCR SCREEN
MRSA, PCR: NEGATIVE
Staphylococcus aureus: NEGATIVE

## 2013-10-16 LAB — PROTIME-INR
INR: 0.98 (ref 0.00–1.49)
Prothrombin Time: 12.8 seconds (ref 11.6–15.2)

## 2013-10-16 NOTE — Progress Notes (Signed)
10/16/13 0834  OBSTRUCTIVE SLEEP APNEA  Have you ever been diagnosed with sleep apnea through a sleep study? No  Do you snore loudly (loud enough to be heard through closed doors)?  1  Do you often feel tired, fatigued, or sleepy during the daytime? 1  Has anyone observed you stop breathing during your sleep? 1  Do you have, or are you being treated for high blood pressure? 1  BMI more than 35 kg/m2? 1  Age over 63 years old? 1  Neck circumference greater than 40 cm/18 inches? 0  Gender: 0  Obstructive Sleep Apnea Score 6  Score 4 or greater  Results sent to PCP

## 2013-10-21 ENCOUNTER — Other Ambulatory Visit: Payer: Self-pay | Admitting: Orthopedic Surgery

## 2013-10-21 NOTE — H&P (Signed)
Bethany [name]  DOB: [birth year] Married / Language: Undefined / Race: Refused to Report/Unreported Female  Date of Admission:  10/22/2013  Chief Complaint:  Right Knee Pain  History of Present Illness The patient is a 62 year old female who comes in for a preoperative History and Physical. The patient is scheduled for a right total knee arthroplasty to be performed by Dr. Gus Rankin. Aluisio, MD at [facility] on 10/22/2013. Ms. Bethany [name] is a 62 year old female with long history of pain involving both knees. She states that historically the left knee was worse, but now for the past year or so the right knee has been worse than the left. 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 Pain is now occurring at all times. It is limiting what she can and cannot do. It will frequently keep her awake at night. She is unable to walk or do things that she desires. The knees do not give out on her. The knees do swell on her. She is at a stage now where she feels like she needs to get something done with them. She has had cortisone injections on two occasions in the past. In 2008 she had injections that lasted for about three months. A few years ago she had injections that only lasted a few days and the pain got much worse after that. Even worse than the pain, however, are her functional difficulties now. She is now ready to proceed with surgery on the right knee They have been treated conservatively in the past for the above stated problem and despite conservative measures, they continue to have progressive pain and severe functional limitations and dysfunction. They have failed non-operative management including home exercise, medications, and injections. It is felt that they would benefit from undergoing total joint replacement. Risks and benefits of the procedure have been discussed with the patient and they elect to proceed with surgery. There are no  active contraindications to surgery such as ongoing infection or rapidly progressive neurological disease.  Problem List Primary osteoarthritis of both knees (715.16)   Allergies No Known Drug Allergies    Family History Father. Deceased. age 64, Gunshot Mother. Deceased, Essential hypertension. age 30, Elevated Cholesterol    Social History Tobacco use. Never smoker. Alcohol use. Never consumed alcohol. Children. 4 Current occupation. CNA, Retired Merchant [employer] officer. Living Will Post-Surgical Plans. Home with family    Medication History Acyclovir ( Oral) Specific dose unknown - Active. Lisinopril-HCTZ ( Oral) Active. Pravastatin Sodium ( Oral) Specific dose unknown - Active. CeleBREX ( Oral) Specific dose unknown - Active. Vitamin B Complex ( Oral) Active. Centrum Silver ( Oral) Active.    Past Surgical History Tubal Ligation. Date: 61. Vein Stripping. Date: 68. Right Breast Tumor Excision. Date: 1966. Nonmalignant Left Wrist Tumor Excision. Date: 2001. Hysterectomy (not due to cancer) - Partial. Date: 1997.  Medical History Migraine Headache Vertigo Tinnitus Cataract Hypertension Hypercholesterolemia Varicose veins Menopause   Review of Systems General:Present- Chills and Night Sweats. Not Present- Fever, Fatigue, Weight Gain, Weight Loss and Memory Loss. Skin:Not Present- Hives, Itching, Rash, Eczema and Lesions. HEENT:Present- Tinnitus, Headache and Blurred Vision. Not Present- Double Vision, Visual Loss, Hearing Loss and Dentures. Respiratory:Present- Shortness of breath with exertion. Not Present- Shortness of breath at rest, Allergies, Coughing up blood and Chronic Cough. Cardiovascular:Not Present- Chest Pain, Racing/skipping heartbeats, Difficulty Breathing Lying Down, Murmur, Swelling and Palpitations. Gastrointestinal:Not Present- Bloody Stool, Heartburn, Abdominal Pain, Vomiting, Nausea, Constipation,  Diarrhea, Difficulty Swallowing, Jaundice and Loss of appetitie. Female Genitourinary:Not Present- Blood in Urine, Urinary frequency, Weak urinary stream, Discharge, Flank Pain, Incontinence, Painful Urination, Urgency, Urinary Retention and Urinating at Night. Musculoskeletal:Present- Muscle Weakness, Muscle Pain, Joint Swelling, Joint Pain, Back Pain, Morning Stiffness and Spasms. Neurological:Not Present- Tremor, Dizziness, Blackout spells, Paralysis, Difficulty with balance and Weakness. Psychiatric:Not Present- Insomnia.    Vitals Weight: 235 lb Height: 65 in Body Surface Area: 2.21 m Body Mass Index: 39.11 kg/m Pulse: 84 (Regular) Resp.: 16 (Unlabored) BP: 142/84 (Sitting, Right Arm, Standard)     Physical Exam The physical exam findings are as follows:   General Mental Status - Alert, cooperative and good historian. General Appearance- pleasant. Not in acute distress. Orientation- Oriented X3. Build & Nutrition- Well nourished and Well developed.   Head and Neck Head- normocephalic, atraumatic . Neck Global Assessment- supple. no bruit auscultated on the right and no bruit auscultated on the left.   Eye Vision- Wears corrective lenses. Pupil- Bilateral- Regular and Round. Motion- Bilateral- EOMI.   Chest and Lung Exam Auscultation: Breath sounds:- clear at anterior chest wall and - clear at posterior chest wall. Adventitious sounds:- No Adventitious sounds.   Cardiovascular Auscultation:Rhythm- Regular rate and rhythm. Heart Sounds- S1 WNL and S2 WNL. Murmurs & Other Heart Sounds:Auscultation of the heart reveals - No Murmurs.   Abdomen Inspection:Contour- Generalized mild distention. Palpation/Percussion:Tenderness- Abdomen is non-tender to palpation. Rigidity (guarding)- Abdomen is soft. Auscultation:Auscultation of the abdomen reveals - Bowel sounds normal.   Female Genitourinary Not done, not  pertinent to present illness  Musculoskeletal  On physical exam very pleasant, well developed female. Alert and oriented in no apparent distress. Evaluation of her hips show normal range of motion with no discomfort. The left knee shows slight effusion. She has slight varus deformity. Range 5 to 125 with marked crepitus on range of motion, tenderness medial greater than lateral with no instability. The right knee varus deformity. No effusion. Range 5 to 125. Tender medial greater than lateral. Marked crepitus on range of motion, no instability noted. Pulses sensation motor intact both lower extremities. She is not having any peripheral edema. She walks with a significantly antalgic gait.  RADIOGRAPHS: Radiographs that were taken at the [facility] office demonstrate bone on bone arthritis in the medial and patellofemoral compartments of both knees. The left knee is slightly worse radiographically.   Assessment & Plan Primary osteoarthritis of both knees (715.16) Impression: Right Knee  Note: Plan is for a Right Total Knee Replacement by Dr. Lequita Halt.  Plan is to go home with family.  PCP - Dr. Modesto Charon - Patient has been seen preoperatively and felt to be stable for surgery.  The patient does not have any contraindications and will receive TXA (tranexamic acid) prior to surgery.  Signed electronically by Lauraine Rinne, III PA-C

## 2013-10-22 ENCOUNTER — Encounter (HOSPITAL_COMMUNITY): Payer: PRIVATE HEALTH INSURANCE | Admitting: Anesthesiology

## 2013-10-22 ENCOUNTER — Inpatient Hospital Stay (HOSPITAL_COMMUNITY)
Admission: RE | Admit: 2013-10-22 | Discharge: 2013-10-24 | DRG: 470 | Disposition: A | Discharge status: Home Health | Payer: PRIVATE HEALTH INSURANCE | Source: Ambulatory Visit | Attending: Orthopedic Surgery | Admitting: Orthopedic Surgery

## 2013-10-22 ENCOUNTER — Encounter (HOSPITAL_COMMUNITY)
Admission: RE | Disposition: A | Discharge status: Home Health | Payer: Self-pay | Source: Ambulatory Visit | Attending: Orthopedic Surgery

## 2013-10-22 ENCOUNTER — Inpatient Hospital Stay (HOSPITAL_COMMUNITY): Payer: PRIVATE HEALTH INSURANCE | Admitting: Anesthesiology

## 2013-10-22 ENCOUNTER — Encounter (HOSPITAL_COMMUNITY): Payer: Self-pay | Admitting: *Deleted

## 2013-10-22 DIAGNOSIS — Z96651 Presence of right artificial knee joint: Secondary | ICD-10-CM

## 2013-10-22 DIAGNOSIS — E7800 Pure hypercholesterolemia, unspecified: Secondary | ICD-10-CM | POA: Diagnosis present

## 2013-10-22 DIAGNOSIS — M179 Osteoarthritis of knee, unspecified: Secondary | ICD-10-CM | POA: Diagnosis present

## 2013-10-22 DIAGNOSIS — K219 Gastro-esophageal reflux disease without esophagitis: Secondary | ICD-10-CM | POA: Diagnosis present

## 2013-10-22 DIAGNOSIS — I10 Essential (primary) hypertension: Secondary | ICD-10-CM | POA: Diagnosis present

## 2013-10-22 DIAGNOSIS — E785 Hyperlipidemia, unspecified: Secondary | ICD-10-CM

## 2013-10-22 DIAGNOSIS — Z6839 Body mass index (BMI) 39.0-39.9, adult: Secondary | ICD-10-CM

## 2013-10-22 DIAGNOSIS — M1710 Unilateral primary osteoarthritis, unspecified knee: Principal | ICD-10-CM | POA: Diagnosis present

## 2013-10-22 HISTORY — PX: TOTAL KNEE ARTHROPLASTY: SHX125

## 2013-10-22 LAB — TYPE AND SCREEN: Antibody Screen: NEGATIVE

## 2013-10-22 SURGERY — ARTHROPLASTY, KNEE, TOTAL
Anesthesia: General | Site: Knee | Laterality: Right | Wound class: Clean

## 2013-10-22 MED ORDER — ACETAMINOPHEN 325 MG PO TABS: 650.0000 mg | ORAL_TABLET | Freq: Four times a day (QID) | ORAL | Status: DC | PRN

## 2013-10-22 MED ORDER — PROPOFOL 10 MG/ML IV BOLUS
INTRAVENOUS | Status: DC | PRN
  Administered 2013-10-22: 100 mg via INTRAVENOUS
  Administered 2013-10-22: 40 mg via INTRAVENOUS

## 2013-10-22 MED ORDER — GLYCOPYRROLATE 0.2 MG/ML IJ SOLN
INTRAMUSCULAR | Status: DC | PRN
  Administered 2013-10-22: .5 mg via INTRAVENOUS

## 2013-10-22 MED ORDER — CEFAZOLIN SODIUM-DEXTROSE 2-3 GM-% IV SOLR
2.0000 g | INTRAVENOUS | Status: AC
  Administered 2013-10-22: 2 g via INTRAVENOUS

## 2013-10-22 MED ORDER — DOCUSATE SODIUM 100 MG PO CAPS
100.0000 mg | ORAL_CAPSULE | Freq: Two times a day (BID) | ORAL | Status: DC
  Administered 2013-10-22 – 2013-10-24 (×4): 100 mg via ORAL

## 2013-10-22 MED ORDER — ACETAMINOPHEN 650 MG RE SUPP: 650.0000 mg | Freq: Four times a day (QID) | RECTAL | Status: DC | PRN

## 2013-10-22 MED ORDER — ONDANSETRON HCL 4 MG/2ML IJ SOLN: 4.0000 mg | Freq: Four times a day (QID) | INTRAMUSCULAR | Status: DC | PRN

## 2013-10-22 MED ORDER — KETAMINE HCL 10 MG/ML IJ SOLN
INTRAMUSCULAR | Status: DC | PRN
  Administered 2013-10-22: 20 mg via INTRAVENOUS

## 2013-10-22 MED ORDER — METHOCARBAMOL 500 MG PO TABS
500.0000 mg | ORAL_TABLET | Freq: Four times a day (QID) | ORAL | Status: DC | PRN
  Administered 2013-10-22 – 2013-10-24 (×6): 500 mg via ORAL
  Filled 2013-10-22 (×6): qty 1

## 2013-10-22 MED ORDER — TRAMADOL HCL 50 MG PO TABS
50.0000 mg | ORAL_TABLET | Freq: Four times a day (QID) | ORAL | Status: DC | PRN
  Administered 2013-10-23 – 2013-10-24 (×3): 100 mg via ORAL
  Filled 2013-10-22 (×3): qty 2

## 2013-10-22 MED ORDER — MENTHOL 3 MG MT LOZG: 1.0000 | LOZENGE | OROMUCOSAL | Status: DC | PRN

## 2013-10-22 MED ORDER — DIPHENHYDRAMINE HCL 12.5 MG/5ML PO ELIX: 12.5000 mg | ORAL_SOLUTION | ORAL | Status: DC | PRN

## 2013-10-22 MED ORDER — PROMETHAZINE HCL 25 MG/ML IJ SOLN: 6.2500 mg | INTRAMUSCULAR | Status: DC | PRN

## 2013-10-22 MED ORDER — POLYETHYLENE GLYCOL 3350 17 G PO PACK: 17.0000 g | PACK | Freq: Every day | ORAL | Status: DC | PRN

## 2013-10-22 MED ORDER — METOCLOPRAMIDE HCL 10 MG PO TABS: 5.0000 mg | ORAL_TABLET | Freq: Three times a day (TID) | ORAL | Status: DC | PRN

## 2013-10-22 MED ORDER — CEFAZOLIN SODIUM-DEXTROSE 2-3 GM-% IV SOLR
INTRAVENOUS | Status: AC
  Filled 2013-10-22: qty 50

## 2013-10-22 MED ORDER — LACTATED RINGERS IV SOLN
INTRAVENOUS | Status: DC
  Administered 2013-10-22: 12:00:00 via INTRAVENOUS
  Administered 2013-10-22: 1000 mL via INTRAVENOUS

## 2013-10-22 MED ORDER — RIVAROXABAN 10 MG PO TABS
10.0000 mg | ORAL_TABLET | Freq: Every day | ORAL | Status: DC
  Administered 2013-10-23 – 2013-10-24 (×2): 10 mg via ORAL
  Filled 2013-10-22 (×3): qty 1

## 2013-10-22 MED ORDER — OXYCODONE HCL 5 MG PO TABS
5.0000 mg | ORAL_TABLET | ORAL | Status: DC | PRN
  Administered 2013-10-22 – 2013-10-23 (×5): 10 mg via ORAL
  Administered 2013-10-23 – 2013-10-24 (×3): 5 mg via ORAL
  Filled 2013-10-22: qty 2
  Filled 2013-10-22: qty 1
  Filled 2013-10-22: qty 2
  Filled 2013-10-22: qty 1
  Filled 2013-10-22 (×2): qty 2
  Filled 2013-10-22 (×2): qty 1
  Filled 2013-10-22: qty 2

## 2013-10-22 MED ORDER — SUCCINYLCHOLINE CHLORIDE 20 MG/ML IJ SOLN
INTRAMUSCULAR | Status: DC | PRN
  Administered 2013-10-22: 100 mg via INTRAVENOUS

## 2013-10-22 MED ORDER — HYDROMORPHONE HCL PF 1 MG/ML IJ SOLN
INTRAMUSCULAR | Status: AC
  Filled 2013-10-22: qty 1

## 2013-10-22 MED ORDER — METOCLOPRAMIDE HCL 5 MG/ML IJ SOLN: 5.0000 mg | Freq: Three times a day (TID) | INTRAMUSCULAR | Status: DC | PRN

## 2013-10-22 MED ORDER — SODIUM CHLORIDE 0.9 % IJ SOLN
INTRAMUSCULAR | Status: AC
  Filled 2013-10-22: qty 50

## 2013-10-22 MED ORDER — DEXAMETHASONE 6 MG PO TABS
10.0000 mg | ORAL_TABLET | Freq: Every day | ORAL | Status: AC
  Administered 2013-10-23: 09:00:00 10 mg via ORAL
  Filled 2013-10-22: qty 1

## 2013-10-22 MED ORDER — PHENOL 1.4 % MT LIQD: 1.0000 | OROMUCOSAL | Status: DC | PRN

## 2013-10-22 MED ORDER — TRANEXAMIC ACID 100 MG/ML IV SOLN
1000.0000 mg | INTRAVENOUS | Status: AC
  Administered 2013-10-22: 1000 mg via INTRAVENOUS
  Filled 2013-10-22: qty 10

## 2013-10-22 MED ORDER — DEXAMETHASONE SODIUM PHOSPHATE 10 MG/ML IJ SOLN
10.0000 mg | Freq: Once | INTRAMUSCULAR | Status: AC
  Administered 2013-10-22: 10 mg via INTRAVENOUS

## 2013-10-22 MED ORDER — BUPIVACAINE HCL (PF) 0.25 % IJ SOLN
INTRAMUSCULAR | Status: AC
  Filled 2013-10-22: qty 30

## 2013-10-22 MED ORDER — HYDROMORPHONE HCL PF 1 MG/ML IJ SOLN
0.2500 mg | INTRAMUSCULAR | Status: DC | PRN
  Administered 2013-10-22: 0.5 mg via INTRAVENOUS
  Administered 2013-10-22: 0.25 mg via INTRAVENOUS
  Administered 2013-10-22: 0.5 mg via INTRAVENOUS
  Administered 2013-10-22 (×2): 0.25 mg via INTRAVENOUS

## 2013-10-22 MED ORDER — BISACODYL 10 MG RE SUPP: 10.0000 mg | Freq: Every day | RECTAL | Status: DC | PRN

## 2013-10-22 MED ORDER — DEXAMETHASONE SODIUM PHOSPHATE 10 MG/ML IJ SOLN
10.0000 mg | Freq: Every day | INTRAMUSCULAR | Status: AC
  Filled 2013-10-22: qty 1

## 2013-10-22 MED ORDER — ACYCLOVIR 200 MG PO CAPS
200.0000 mg | ORAL_CAPSULE | Freq: Every day | ORAL | Status: DC | PRN
  Filled 2013-10-22: qty 1

## 2013-10-22 MED ORDER — CEFAZOLIN SODIUM 1-5 GM-% IV SOLN
1.0000 g | Freq: Four times a day (QID) | INTRAVENOUS | Status: AC
  Administered 2013-10-22 (×2): 1 g via INTRAVENOUS
  Filled 2013-10-22 (×3): qty 50

## 2013-10-22 MED ORDER — NEOSTIGMINE METHYLSULFATE 1 MG/ML IJ SOLN
INTRAMUSCULAR | Status: DC | PRN
  Administered 2013-10-22: 3 mg via INTRAVENOUS

## 2013-10-22 MED ORDER — STERILE WATER FOR IRRIGATION IR SOLN
Status: DC | PRN
  Administered 2013-10-22: 3000 mL

## 2013-10-22 MED ORDER — KETOROLAC TROMETHAMINE 15 MG/ML IJ SOLN
7.5000 mg | Freq: Four times a day (QID) | INTRAMUSCULAR | Status: AC | PRN
  Administered 2013-10-22: 16:00:00 7.5 mg via INTRAVENOUS
  Filled 2013-10-22: qty 1

## 2013-10-22 MED ORDER — ONDANSETRON HCL 4 MG PO TABS: 4.0000 mg | ORAL_TABLET | Freq: Four times a day (QID) | ORAL | Status: DC | PRN

## 2013-10-22 MED ORDER — METHOCARBAMOL 100 MG/ML IJ SOLN
500.0000 mg | Freq: Four times a day (QID) | INTRAVENOUS | Status: DC | PRN
  Filled 2013-10-22: qty 5

## 2013-10-22 MED ORDER — ACETAMINOPHEN 500 MG PO TABS
1000.0000 mg | ORAL_TABLET | Freq: Four times a day (QID) | ORAL | Status: AC
  Administered 2013-10-22 – 2013-10-23 (×3): 1000 mg via ORAL
  Filled 2013-10-22 (×4): qty 2

## 2013-10-22 MED ORDER — SODIUM CHLORIDE 0.9 % IR SOLN
Status: DC | PRN
  Administered 2013-10-22: 1000 mL

## 2013-10-22 MED ORDER — FENTANYL CITRATE 0.05 MG/ML IJ SOLN
INTRAMUSCULAR | Status: DC | PRN
  Administered 2013-10-22 (×2): 50 ug via INTRAVENOUS
  Administered 2013-10-22: 100 ug via INTRAVENOUS
  Administered 2013-10-22: 50 ug via INTRAVENOUS

## 2013-10-22 MED ORDER — ACETAMINOPHEN 500 MG PO TABS
1000.0000 mg | ORAL_TABLET | Freq: Once | ORAL | Status: AC
  Administered 2013-10-22: 1000 mg via ORAL
  Filled 2013-10-22: qty 2

## 2013-10-22 MED ORDER — MORPHINE SULFATE 2 MG/ML IJ SOLN: 1.0000 mg | INTRAMUSCULAR | Status: DC | PRN

## 2013-10-22 MED ORDER — 0.9 % SODIUM CHLORIDE (POUR BTL) OPTIME
TOPICAL | Status: DC | PRN
  Administered 2013-10-22: 1000 mL

## 2013-10-22 MED ORDER — BUPIVACAINE LIPOSOME 1.3 % IJ SUSP
20.0000 mL | Freq: Once | INTRAMUSCULAR | Status: DC
  Filled 2013-10-22: qty 20

## 2013-10-22 MED ORDER — ONDANSETRON HCL 4 MG/2ML IJ SOLN
INTRAMUSCULAR | Status: DC | PRN
  Administered 2013-10-22: 4 mg via INTRAVENOUS

## 2013-10-22 MED ORDER — SODIUM CHLORIDE 0.9 % IV SOLN: INTRAVENOUS | Status: DC

## 2013-10-22 MED ORDER — KCL IN DEXTROSE-NACL 20-5-0.9 MEQ/L-%-% IV SOLN
INTRAVENOUS | Status: DC
  Administered 2013-10-22 – 2013-10-23 (×2): via INTRAVENOUS
  Filled 2013-10-22 (×2): qty 1000

## 2013-10-22 MED ORDER — BUPIVACAINE HCL 0.25 % IJ SOLN
INTRAMUSCULAR | Status: DC | PRN
  Administered 2013-10-22: 20 mL

## 2013-10-22 MED ORDER — SODIUM CHLORIDE 0.9 % IJ SOLN
INTRAMUSCULAR | Status: DC | PRN
  Administered 2013-10-22: 13:00:00

## 2013-10-22 MED ORDER — FLEET ENEMA 7-19 GM/118ML RE ENEM: 1.0000 | ENEMA | Freq: Once | RECTAL | Status: AC | PRN

## 2013-10-22 MED ORDER — ROCURONIUM BROMIDE 100 MG/10ML IV SOLN
INTRAVENOUS | Status: DC | PRN
  Administered 2013-10-22: 10 mg via INTRAVENOUS

## 2013-10-22 SURGICAL SUPPLY — 56 items
BAG ZIPLOCK 12X15 (MISCELLANEOUS) ×2 IMPLANT
BANDAGE ELASTIC 6 VELCRO ST LF (GAUZE/BANDAGES/DRESSINGS) ×2 IMPLANT
BANDAGE ESMARK 6X9 LF (GAUZE/BANDAGES/DRESSINGS) ×1 IMPLANT
BLADE SAG 18X100X1.27 (BLADE) ×2 IMPLANT
BLADE SAW SGTL 11.0X1.19X90.0M (BLADE) ×2 IMPLANT
BNDG ESMARK 6X9 LF (GAUZE/BANDAGES/DRESSINGS) ×2
BOWL SMART MIX CTS (DISPOSABLE) ×2 IMPLANT
CAPT RP KNEE ×2 IMPLANT
CEMENT HV SMART SET (Cement) ×4 IMPLANT
CLOSURE STERI-STRIP 1/4X4 (GAUZE/BANDAGES/DRESSINGS) ×2 IMPLANT
CLOTH BEACON ORANGE TIMEOUT ST (SAFETY) ×2 IMPLANT
CUFF TOURN SGL QUICK 34 (TOURNIQUET CUFF) ×1
CUFF TRNQT CYL 34X4X40X1 (TOURNIQUET CUFF) ×1 IMPLANT
DECANTER SPIKE VIAL GLASS SM (MISCELLANEOUS) ×2 IMPLANT
DRAPE EXTREMITY T 121X128X90 (DRAPE) ×2 IMPLANT
DRAPE POUCH INSTRU U-SHP 10X18 (DRAPES) ×2 IMPLANT
DRAPE U-SHAPE 47X51 STRL (DRAPES) ×2 IMPLANT
DRSG ADAPTIC 3X8 NADH LF (GAUZE/BANDAGES/DRESSINGS) ×2 IMPLANT
DRSG PAD ABDOMINAL 8X10 ST (GAUZE/BANDAGES/DRESSINGS) ×2 IMPLANT
DURAPREP 26ML APPLICATOR (WOUND CARE) ×2 IMPLANT
ELECT REM PT RETURN 9FT ADLT (ELECTROSURGICAL) ×2
ELECTRODE REM PT RTRN 9FT ADLT (ELECTROSURGICAL) ×1 IMPLANT
EVACUATOR 1/8 PVC DRAIN (DRAIN) ×2 IMPLANT
FACESHIELD LNG OPTICON STERILE (SAFETY) ×10 IMPLANT
GLOVE BIO SURGEON STRL SZ7.5 (GLOVE) IMPLANT
GLOVE BIO SURGEON STRL SZ8 (GLOVE) ×2 IMPLANT
GLOVE BIOGEL PI IND STRL 8 (GLOVE) ×2 IMPLANT
GLOVE BIOGEL PI INDICATOR 8 (GLOVE) ×2
GLOVE SURG SS PI 6.5 STRL IVOR (GLOVE) IMPLANT
GOWN PREVENTION PLUS LG XLONG (DISPOSABLE) ×2 IMPLANT
GOWN STRL REIN XL XLG (GOWN DISPOSABLE) IMPLANT
HANDPIECE INTERPULSE COAX TIP (DISPOSABLE) ×1
IMMOBILIZER KNEE 20 (SOFTGOODS) ×2
IMMOBILIZER KNEE 20 THIGH 36 (SOFTGOODS) ×1 IMPLANT
KIT BASIN OR (CUSTOM PROCEDURE TRAY) ×2 IMPLANT
MANIFOLD NEPTUNE II (INSTRUMENTS) ×2 IMPLANT
NDL SAFETY ECLIPSE 18X1.5 (NEEDLE) ×2 IMPLANT
NEEDLE HYPO 18GX1.5 SHARP (NEEDLE) ×2
NS IRRIG 1000ML POUR BTL (IV SOLUTION) ×2 IMPLANT
PACK TOTAL JOINT (CUSTOM PROCEDURE TRAY) ×2 IMPLANT
PADDING CAST COTTON 6X4 STRL (CAST SUPPLIES) ×4 IMPLANT
POSITIONER SURGICAL ARM (MISCELLANEOUS) ×2 IMPLANT
SET HNDPC FAN SPRY TIP SCT (DISPOSABLE) ×1 IMPLANT
SPONGE GAUZE 4X4 12PLY (GAUZE/BANDAGES/DRESSINGS) ×2 IMPLANT
STRIP CLOSURE SKIN 1/2X4 (GAUZE/BANDAGES/DRESSINGS) ×4 IMPLANT
SUCTION FRAZIER 12FR DISP (SUCTIONS) ×2 IMPLANT
SUT MNCRL AB 4-0 PS2 18 (SUTURE) ×2 IMPLANT
SUT VIC AB 2-0 CT1 27 (SUTURE) ×3
SUT VIC AB 2-0 CT1 TAPERPNT 27 (SUTURE) ×3 IMPLANT
SUT VLOC 180 0 24IN GS25 (SUTURE) ×2 IMPLANT
SYR 20CC LL (SYRINGE) ×2 IMPLANT
SYR 50ML LL SCALE MARK (SYRINGE) ×2 IMPLANT
TOWEL OR 17X26 10 PK STRL BLUE (TOWEL DISPOSABLE) ×4 IMPLANT
TRAY FOLEY CATH 14FRSI W/METER (CATHETERS) ×2 IMPLANT
WATER STERILE IRR 1500ML POUR (IV SOLUTION) ×2 IMPLANT
WRAP KNEE MAXI GEL POST OP (GAUZE/BANDAGES/DRESSINGS) ×2 IMPLANT

## 2013-10-22 NOTE — H&P (View-Only) (Signed)
Bethany [name]  DOB: 04/06/1951 Married / Language: Undefined / Race: Refused to Report/Unreported Female  Date of Admission:  10/22/2013  Chief Complaint:  Right Knee Pain  History of Present Illness The patient is a 62 year old female who comes in for a preoperative History and Physical. The patient is scheduled for a right total knee arthroplasty to be performed by Dr. Frank V. Aluisio, MD at [facility] on 10/22/2013. Bethany [name] is a 62 year old female with long history of pain involving both knees. She states that historically the left knee was worse, but now for the past year or so the right knee has been worse than the left. 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 Pain is now occurring at all times. It is limiting what she can and cannot do. It will frequently keep her awake at night. She is unable to walk or do things that she desires. The knees do not give out on her. The knees do swell on her. She is at a stage now where she feels like she needs to get something done with them. She has had cortisone injections on two occasions in the past. In 2008 she had injections that lasted for about three months. A few years ago she had injections that only lasted a few days and the pain got much worse after that. Even worse than the pain, however, are her functional difficulties now. She is now ready to proceed with surgery on the right knee They have been treated conservatively in the past for the above stated problem and despite conservative measures, they continue to have progressive pain and severe functional limitations and dysfunction. They have failed non-operative management including home exercise, medications, and injections. It is felt that they would benefit from undergoing total joint replacement. Risks and benefits of the procedure have been discussed with the patient and they elect to proceed with surgery. There are no  active contraindications to surgery such as ongoing infection or rapidly progressive neurological disease.  Problem List Primary osteoarthritis of both knees (715.16)   Allergies No Known Drug Allergies    Family History Father. Deceased. age 45, Gunshot Mother. Deceased, Essential hypertension. age 78, Elevated Cholesterol    Social History Tobacco use. Never smoker. Alcohol use. Never consumed alcohol. Children. 4 Current occupation. CNA, Retired [employer]. Living Will Post-Surgical Plans. Home with family    Medication History Acyclovir ( Oral) Specific dose unknown - Active. Lisinopril-HCTZ ( Oral) Active. Pravastatin Sodium ( Oral) Specific dose unknown - Active. CeleBREX ( Oral) Specific dose unknown - Active. Vitamin B Complex ( Oral) Active. Centrum Silver ( Oral) Active.    Past Surgical History Tubal Ligation. Date: 1972. Vein Stripping. Date: 1976. Right Breast Tumor Excision. Date: 1966. Nonmalignant Left Wrist Tumor Excision. Date: 2001. Hysterectomy (not due to cancer) - Partial. Date: 1997.  Medical History Migraine Headache Vertigo Tinnitus Cataract Hypertension Hypercholesterolemia Varicose veins Menopause   Review of Systems General:Present- Chills and Night Sweats. Not Present- Fever, Fatigue, Weight Gain, Weight Loss and Memory Loss. Skin:Not Present- Hives, Itching, Rash, Eczema and Lesions. HEENT:Present- Tinnitus, Headache and Blurred Vision. Not Present- Double Vision, Visual Loss, Hearing Loss and Dentures. Respiratory:Present- Shortness of breath with exertion. Not Present- Shortness of breath at rest, Allergies, Coughing up blood and Chronic Cough. Cardiovascular:Not Present- Chest Pain, Racing/skipping heartbeats, Difficulty Breathing Lying Down, Murmur, Swelling and Palpitations. Gastrointestinal:Not Present- Bloody Stool, Heartburn, Abdominal Pain, Vomiting, Nausea, Constipation,    Diarrhea, Difficulty Swallowing, Jaundice and Loss of appetitie. Female Genitourinary:Not Present- Blood in Urine, Urinary frequency, Weak urinary stream, Discharge, Flank Pain, Incontinence, Painful Urination, Urgency, Urinary Retention and Urinating at Night. Musculoskeletal:Present- Muscle Weakness, Muscle Pain, Joint Swelling, Joint Pain, Back Pain, Morning Stiffness and Spasms. Neurological:Not Present- Tremor, Dizziness, Blackout spells, Paralysis, Difficulty with balance and Weakness. Psychiatric:Not Present- Insomnia.    Vitals Weight: 235 lb Height: 65 in Body Surface Area: 2.21 m Body Mass Index: 39.11 kg/m Pulse: 84 (Regular) Resp.: 16 (Unlabored) BP: 142/84 (Sitting, Right Arm, Standard)     Physical Exam The physical exam findings are as follows:   General Mental Status - Alert, cooperative and good historian. General Appearance- pleasant. Not in acute distress. Orientation- Oriented X3. Build & Nutrition- Well nourished and Well developed.   Head and Neck Head- normocephalic, atraumatic . Neck Global Assessment- supple. no bruit auscultated on the right and no bruit auscultated on the left.   Eye Vision- Wears corrective lenses. Pupil- Bilateral- Regular and Round. Motion- Bilateral- EOMI.   Chest and Lung Exam Auscultation: Breath sounds:- clear at anterior chest wall and - clear at posterior chest wall. Adventitious sounds:- No Adventitious sounds.   Cardiovascular Auscultation:Rhythm- Regular rate and rhythm. Heart Sounds- S1 WNL and S2 WNL. Murmurs & Other Heart Sounds:Auscultation of the heart reveals - No Murmurs.   Abdomen Inspection:Contour- Generalized mild distention. Palpation/Percussion:Tenderness- Abdomen is non-tender to palpation. Rigidity (guarding)- Abdomen is soft. Auscultation:Auscultation of the abdomen reveals - Bowel sounds normal.   Female Genitourinary Not done, not  pertinent to present illness  Musculoskeletal  On physical exam very pleasant, well developed female. Alert and oriented in no apparent distress. Evaluation of her hips show normal range of motion with no discomfort. The left knee shows slight effusion. She has slight varus deformity. Range 5 to 125 with marked crepitus on range of motion, tenderness medial greater than lateral with no instability. The right knee varus deformity. No effusion. Range 5 to 125. Tender medial greater than lateral. Marked crepitus on range of motion, no instability noted. Pulses sensation motor intact both lower extremities. She is not having any peripheral edema. She walks with a significantly antalgic gait.  RADIOGRAPHS: Radiographs that were taken at the [place] office demonstrate bone on bone arthritis in the medial and patellofemoral compartments of both knees. The left knee is slightly worse radiographically.   Assessment & Plan Primary osteoarthritis of both knees (715.16) Impression: Right Knee  Note: Plan is for a Right Total Knee Replacement by Dr. Aluisio.  Plan is to go home with family.  PCP - Dr. Wong - Patient has been seen preoperatively and felt to be stable for surgery.  The patient does not have any contraindications and will receive TXA (tranexamic acid) prior to surgery.  Signed electronically by Andoni Busch L Kwamaine Cuppett, III PA-C  

## 2013-10-22 NOTE — Transfer of Care (Signed)
Immediate Anesthesia Transfer of Care Note  Patient: Bethany [name]  Procedure(s) Performed: Procedure(s): RIGHT TOTAL KNEE ARTHROPLASTY (Right)  Patient Location: PACU  Anesthesia Type:General  Level of Consciousness: awake, alert  and patient cooperative  Airway & Oxygen Therapy: Patient Spontanous Breathing and Patient connected to face mask oxygen  Post-op Assessment: Report given to PACU RN and Post -op Vital signs reviewed and stable  Post vital signs: Reviewed and stable  Complications: No apparent anesthesia complications

## 2013-10-22 NOTE — Anesthesia Preprocedure Evaluation (Addendum)
Anesthesia Evaluation  Patient identified by MRN, date of birth, ID band Patient awake    Reviewed: Allergy & Precautions, H&P , NPO status , Patient's Chart, lab work & pertinent test results  Airway Mallampati: II TM Distance: >3 FB Neck ROM: Full    Dental no notable dental hx.    Pulmonary pneumonia -, resolved,  breath sounds clear to auscultation  Pulmonary exam normal       Cardiovascular Exercise Tolerance: Good hypertension, Pt. on medications Rhythm:Regular Rate:Normal  CXR and ECG from 10-16-13 reviewed.   Neuro/Psych  Headaches, negative psych ROS   GI/Hepatic Neg liver ROS, GERD-  ,  Endo/Other  Morbid obesity  Renal/GU negative Renal ROS  negative genitourinary   Musculoskeletal negative musculoskeletal ROS (+)   Abdominal (+) + obese,   Peds negative pediatric ROS (+)  Hematology negative hematology ROS (+)   Anesthesia Other Findings   Reproductive/Obstetrics negative OB ROS                           Anesthesia Physical Anesthesia Plan  ASA: III  Anesthesia Plan: General   Post-op Pain Management:    Induction: Intravenous  Airway Management Planned: Oral ETT  Additional Equipment:   Intra-op Plan:   Post-operative Plan: Extubation in OR  Informed Consent: I have reviewed the patients History and Physical, chart, labs and discussed the procedure including the risks, benefits and alternatives for the proposed anesthesia with the patient or authorized representative who has indicated his/her understanding and acceptance.   Dental advisory given  Plan Discussed with: CRNA  Anesthesia Plan Comments: (Discussed r/b general versus spinal. Patient has had a bad experience with a previous spinal and prefers general today.)       Anesthesia Quick Evaluation

## 2013-10-22 NOTE — Op Note (Signed)
Pre-operative diagnosis- Osteoarthritis  Right knee(s)  Post-operative diagnosis- Osteoarthritis Right knee(s)  Procedure-  Right  Total Knee Arthroplasty  Surgeon- Bethany Rankin. Harlene Petralia, MD  Assistant- Dimitri Ped, PA-C   Anesthesia-  General EBL-* No blood loss amount entered *  Drains Hemovac  Tourniquet time- 34 minutes @ 300 mm Hg  Complications- None  Condition-PACU - hemodynamically stable.   Brief Clinical Note  Bethany [name] is a 62 y.o. year old female with end stage OA of her right knee with progressively worsening pain and dysfunction. She has constant pain, with activity and at rest and significant functional deficits with difficulties even with ADLs. She has had extensive non-op management including analgesics, injections of cortisone and viscosupplements, and home exercise program, but remains in significant pain with significant dysfunction.Radiographs show bone on bone arthritis medial and patellofemoral. She presents now for right Total Knee Arthroplasty.    Procedure in detail---   The patient is brought into the operating room and positioned supine on the operating table. After successful administration of  General,   a tourniquet is placed high on the  Right thigh(s) and the lower extremity is prepped and draped in the usual sterile fashion. Time out is performed by the operating team and then the  Right lower extremity is wrapped in Esmarch, knee flexed and the tourniquet inflated to 300 mmHg.       A midline incision is made with a ten blade through the subcutaneous tissue to the level of the extensor mechanism. A fresh blade is used to make a medial parapatellar arthrotomy. Soft tissue over the proximal medial tibia is subperiosteally elevated to the joint line with a knife and into the semimembranosus bursa with a Cobb elevator. Soft tissue over the proximal lateral tibia is elevated with attention being paid to avoiding the patellar tendon on the tibial tubercle.  The patella is everted, knee flexed 90 degrees and the ACL and PCL are removed. Findings are bone on bone medial and patellofemoral with large medial osteophytes.        The drill is used to create a starting hole in the distal femur and the canal is thoroughly irrigated with sterile saline to remove the fatty contents. The 5 degree Right  valgus alignment guide is placed into the femoral canal and the distal femoral cutting block is pinned to remove 10 mm off the distal femur. Resection is made with an oscillating saw.      The tibia is subluxed forward and the menisci are removed. The extramedullary alignment guide is placed referencing proximally at the medial aspect of the tibial tubercle and distally along the second metatarsal axis and tibial crest. The block is pinned to remove 2mm off the more deficient medial  side. Resection is made with an oscillating saw. Size 2.5is the most appropriate size for the tibia and the proximal tibia is prepared with the modular drill and keel punch for that size.      The femoral sizing guide is placed and size 3 is most appropriate. Rotation is marked off the epicondylar axis and confirmed by creating a rectangular flexion gap at 90 degrees. The size 3 cutting block is pinned in this rotation and the anterior, posterior and chamfer cuts are made with the oscillating saw. The intercondylar block is then placed and that cut is made.      Trial size 2.5 tibial component, trial size 3 posterior stabilized femur and a 10  mm posterior stabilized rotating platform insert trial  is placed. Full extension is achieved with excellent varus/valgus and anterior/posterior balance throughout full range of motion. The patella is everted and thickness measured to be 22  mm. Free hand resection is taken to 12 mm, a 38 template is placed, lug holes are drilled, trial patella is placed, and it tracks normally. Osteophytes are removed off the posterior femur with the trial in place. All  trials are removed and the cut bone surfaces prepared with pulsatile lavage. Cement is mixed and once ready for implantation, the size 2.5 tibial implant, size  3 posterior stabilized femoral component, and the size 38 patella are cemented in place and the patella is held with the clamp. The trial insert is placed and the knee held in full extension. The Exparel (20 ml mixed with 30 ml saline) and .25% Bupivicaine, are injected into the extensor mechanism, posterior capsule, medial and lateral gutters and subcutaneous tissues.  All extruded cement is removed and once the cement is hard the permanent 10 mm posterior stabilized rotating platform insert is placed into the tibial tray.      The wound is copiously irrigated with saline solution and the extensor mechanism closed over a hemovac drain with #1 PDS suture. The tourniquet is released for a total tourniquet time of 34  minutes. Flexion against gravity is 140 degrees and the patella tracks normally. Subcutaneous tissue is closed with 2.0 vicryl and subcuticular with running 4.0 Monocryl. The incision is cleaned and dried and steri-strips and a bulky sterile dressing are applied. The limb is placed into a knee immobilizer and the patient is awakened and transported to recovery in stable condition.      Please note that a surgical assistant was a medical necessity for this procedure in order to perform it in a safe and expeditious manner. Surgical assistant was necessary to retract the ligaments and vital neurovascular structures to prevent injury to them and also necessary for proper positioning of the limb to allow for anatomic placement of the prosthesis.   Bethany Rankin Ibn Stief, MD    10/22/2013, 12:46 PM

## 2013-10-22 NOTE — Interval H&P Note (Signed)
History and Physical Interval Note:  10/22/2013 8:38 AM  Bethany [name]  has presented today for surgery, with the diagnosis of Osteoarthritis of the Right Knee  The various methods of treatment have been discussed with the patient and family. After consideration of risks, benefits and other options for treatment, the patient has consented to  Procedure(s): RIGHT TOTAL KNEE ARTHROPLASTY (Right) as a surgical intervention .  The patient's history has been reviewed, patient examined, no change in status, stable for surgery.  I have reviewed the patient's chart and labs.  Questions were answered to the patient's satisfaction.     Loanne Drilling

## 2013-10-22 NOTE — Anesthesia Postprocedure Evaluation (Signed)
  Anesthesia Post-op Note  Patient: Bethany [name]  Procedure(s) Performed: Procedure(s) (LRB): RIGHT TOTAL KNEE ARTHROPLASTY (Right)  Patient Location: PACU  Anesthesia Type: General  Level of Consciousness: awake and alert   Airway and Oxygen Therapy: Patient Spontanous Breathing  Post-op Pain: mild  Post-op Assessment: Post-op Vital signs reviewed, Patient's Cardiovascular Status Stable, Respiratory Function Stable, Patent Airway and No signs of Nausea or vomiting  Last Vitals:  Filed Vitals:   10/22/13 1609  BP: 118/72  Pulse: 52  Temp: 36.7 C  Resp: 16    Post-op Vital Signs: stable   Complications: No apparent anesthesia complications

## 2013-10-22 NOTE — Preoperative (Signed)
Beta Blockers   Reason not to administer Beta Blockers:Not Applicable 

## 2013-10-23 LAB — BASIC METABOLIC PANEL
Calcium: 9.5 mg/dL (ref 8.4–10.5)
GFR calc non Af Amer: 64 mL/min — ABNORMAL LOW (ref >90)
Glucose, Bld: 142 mg/dL — ABNORMAL HIGH (ref 70–99)
Potassium: 3.8 mEq/L (ref 3.5–5.1)
Sodium: 135 mEq/L (ref 135–145)

## 2013-10-23 LAB — CBC
Hemoglobin: 11.1 g/dL — ABNORMAL LOW (ref 12.0–15.0)
MCH: 28.1 pg (ref 26.0–34.0)
MCHC: 34.3 g/dL (ref 30.0–36.0)
RBC: 3.95 MIL/uL (ref 3.87–5.11)

## 2013-10-23 NOTE — Progress Notes (Signed)
[facility]  Patient Status: New  [facility] is providing the following services: PT  If patient discharges after hours, please call [phone].   Lanae Crumbly 10/23/2013, 2:33 PM

## 2013-10-23 NOTE — Evaluation (Signed)
Physical Therapy Evaluation Patient Details Name: Bethany [name] MRN: [id number] DOB: Sep 13, 1951 Today's Date: 10/23/2013 Time: 1191-4782 PT Time Calculation (min): 28 min  PT Assessment / Plan / Recommendation History of Present Illness  s/p R TKA  Clinical Impression  Pt admitted with R TKA. Pt currently with functional limitations due to the deficits listed below (see PT Problem List).  Pt will benefit from skilled PT to increase their independence and safety with mobility to allow discharge to the venue listed below.       PT Assessment  Patient needs continued PT services    Follow Up Recommendations  Home health PT    Does the patient have the potential to tolerate intense rehabilitation      Barriers to Discharge        Equipment Recommendations  None recommended by PT    Recommendations for Other Services     Frequency 7X/week    Precautions / Restrictions Precautions Required Braces or Orthoses: Knee Immobilizer - Right Knee Immobilizer - Right: Discontinue once straight leg raise with < 10 degree lag   Pertinent Vitals/Pain VSS      Mobility  Bed Mobility Bed Mobility: Supine to Sit Supine to Sit: 4: Min guard Details for Bed Mobility Assistance: cues for safety Transfers Transfers: Sit to Stand;Stand to Sit Sit to Stand: 4: Min guard;4: Min assist Stand to Sit: 4: Min assist;4: Min guard Details for Transfer Assistance: cues for hand placement and RLE position Ambulation/Gait Ambulation/Gait Assistance: 4: Min assist;4: Min Government social research officer (Feet): 120 Feet Ambulation/Gait Assistance Details: verbal cues for RW safety Gait Pattern: Step-to pattern    Exercises Total Joint Exercises Ankle Circles/Pumps: AROM;Both;10 reps Quad Sets: AROM;10 reps;Both Heel Slides: AROM;10 reps;Right   PT Diagnosis: Difficulty walking  PT Problem List: Decreased strength;Decreased range of motion;Decreased activity tolerance;Decreased mobility;Decreased  knowledge of use of DME PT Treatment Interventions: DME instruction;Gait training;Functional mobility training;Therapeutic activities;Therapeutic exercise;Patient/family education     PT Goals(Current goals can be found in the care plan section) Acute Rehab PT Goals Patient Stated Goal: home, return to I  PT Goal Formulation: With patient Time For Goal Achievement: 10/30/13  Visit Information  Last PT Received On: 10/23/13 Assistance Needed: +1 History of Present Illness: s/p R TKA       Prior Functioning  Home Living Family/patient expects to be discharged to:: Private residence Living Arrangements: Spouse/significant other Available Help at Discharge: Family;Available PRN/intermittently Type of Home: House Home Layout: One level Home Equipment: Walker - 2 wheels;Bedside commode;Cane - single point Prior Function Level of Independence: Independent Communication Communication: No difficulties    Cognition  Cognition Arousal/Alertness: Awake/alert Behavior During Therapy: WFL for tasks assessed/performed Overall Cognitive Status: Within Functional Limits for tasks assessed    Extremity/Trunk Assessment Lower Extremity Assessment Lower Extremity Assessment: RLE deficits/detail RLE Deficits / Details: ankle WFL; able to perform I SLR; knee flexion~75 degrees actively   Balance    End of Session PT - End of Session Activity Tolerance: Patient tolerated treatment well Patient left: in chair;with call bell/phone within reach  GP     [facility] 10/23/2013, 9:44 AM

## 2013-10-23 NOTE — Progress Notes (Signed)
10/23/13 1249  PT Visit Information  Last PT Received On 10/23/13  Assistance Needed +1  History of Present Illness s/p R TKA  PT Time Calculation  PT Start Time 1231  PT Stop Time 1249  PT Time Calculation (min) 18 min  Subjective Data  Patient Stated Goal home, return to I   Precautions  Precautions Knee  Precaution Comments I SLR, KI not used  Restrictions  Other Position/Activity Restrictions WBAT  Cognition  Arousal/Alertness Awake/alert  Behavior During Therapy WFL for tasks assessed/performed  Overall Cognitive Status Within Functional Limits for tasks assessed  Bed Mobility  Bed Mobility Sit to Supine  Sit to Supine 4: Min guard  Details for Bed Mobility Assistance cues for safety  Transfers  Transfers Sit to Stand;Stand to Sit  Sit to Stand 5: Supervision  Stand to Sit 5: Supervision  Details for Transfer Assistance cues for hand placement and RLE position  Ambulation/Gait  Ambulation/Gait Assistance 5: Supervision  Ambulation Distance (Feet) 140 Feet  Assistive device Rolling walker  Ambulation/Gait Assistance Details cues for step through, RW safety with turns  Gait Pattern Step-to pattern;Step-through pattern  Total Joint Exercises  Ankle Circles/Pumps AROM;Both;10 reps  Quad Sets AROM;10 reps;Both  Straight Leg Raises AROM;Right;10 reps  PT - End of Session  Activity Tolerance Patient tolerated treatment well  Patient left in bed;with call bell/phone within reach;with family/visitor present  Nurse Communication Mobility status  PT - Assessment/Plan  PT Plan Current plan remains appropriate  PT Frequency 7X/week  Follow Up Recommendations Home health PT  PT equipment None recommended by PT  PT Goal Progression  Progress towards PT goals Progressing toward goals  Acute Rehab PT Goals  Time For Goal Achievement 10/30/13  PT General Charges  $$ ACUTE PT VISIT 1 Procedure  PT Treatments  $Gait Training 8-22 mins

## 2013-10-23 NOTE — Evaluation (Signed)
Occupational Therapy Evaluation and Discharge Summary Patient Details Name: Kidada Ging MRN: [id number] DOB: 10-Sep-1951 Today's Date: 10/23/2013 Time: 6578-4696 OT Time Calculation (min): 15 min  OT Assessment / Plan / Recommendation History of present illness s/p R TKA   Clinical Impression   Pt admitted for above diagnosis and overall is min assist to S with all adls.  Husband will be there to assist.  All ADL ed completed.  No further OT needs.  Pt has all needed adaptive equipment.    OT Assessment  Patient does not need any further OT services    Follow Up Recommendations  No OT follow up;Supervision/Assistance - 24 hour    Barriers to Discharge      Equipment Recommendations  None recommended by OT    Recommendations for Other Services    Frequency       Precautions / Restrictions Precautions Precautions: Knee Required Braces or Orthoses: Knee Immobilizer - Right Knee Immobilizer - Right: Discontinue once straight leg raise with < 10 degree lag Restrictions Weight Bearing Restrictions: No   Pertinent Vitals/Pain Pt c/o 3/10 pain.  Nurse gave meds during OT treatment.  Vitals stable.    ADL  Eating/Feeding: Performed;Independent Where Assessed - Eating/Feeding: Chair Grooming: Performed;Wash/dry hands;Supervision/safety Where Assessed - Grooming: Unsupported standing Upper Body Bathing: Simulated;Set up Where Assessed - Upper Body Bathing: Unsupported sitting Lower Body Bathing: Simulated;Minimal assistance Where Assessed - Lower Body Bathing: Supported sit to stand Upper Body Dressing: Performed;Set up Where Assessed - Upper Body Dressing: Unsupported sitting Lower Body Dressing: Performed;Minimal assistance Where Assessed - Lower Body Dressing: Supported sit to stand Toilet Transfer: Research scientist (life sciences) Method: Other (comment) (walked to bathroom) Acupuncturist: Comfort height toilet;Grab bars Toileting - Clothing  Manipulation and Hygiene: Performed;Supervision/safety Where Assessed - Engineer, mining and Hygiene: Sit to stand from 3-in-1 or toilet Equipment Used: Rolling walker Transfers/Ambulation Related to ADLs: Pt walked in room with S and walker ADL Comments: Pt did well with all adls.  husband there to assist as needed. Pt only needed assist with R sock and shoe.    OT Diagnosis:    OT Problem List:   OT Treatment Interventions:     OT Goals(Current goals can be found in the care plan section) Acute Rehab OT Goals Patient Stated Goal: home, return to I   Visit Information  Last OT Received On: 10/23/13 Assistance Needed: +1 History of Present Illness: s/p R TKA       Prior Functioning     Home Living Family/patient expects to be discharged to:: Private residence Living Arrangements: Spouse/significant other Available Help at Discharge: Family;Available PRN/intermittently Type of Home: House Home Access: Level entry Home Layout: One level Home Equipment: Walker - 2 wheels;Bedside commode;Cane - single point Prior Function Level of Independence: Independent Communication Communication: No difficulties Dominant Hand: Right         Vision/Perception Vision - History Baseline Vision: No visual deficits Patient Visual Report: No change from baseline Vision - Assessment Vision Assessment: Vision not tested   Cognition  Cognition Arousal/Alertness: Awake/alert Behavior During Therapy: WFL for tasks assessed/performed Overall Cognitive Status: Within Functional Limits for tasks assessed    Extremity/Trunk Assessment Upper Extremity Assessment Upper Extremity Assessment: Overall WFL for tasks assessed Lower Extremity Assessment Lower Extremity Assessment: Defer to PT evaluation RLE Deficits / Details: ankle WFL; able to perform I SLR; knee flexion~75 degrees actively     Mobility Bed Mobility Bed Mobility: Not assessed Supine to Sit: 4: Min  guard  Details for Bed Mobility Assistance: cues for safety Transfers Transfers: Sit to Stand;Stand to Sit Sit to Stand: 5: Supervision Stand to Sit: 5: Supervision Details for Transfer Assistance: cues for hand placement and RLE position     Exercise Total Joint Exercises Ankle Circles/Pumps: AROM;Both;10 reps Quad Sets: AROM;10 reps;Both Heel Slides: AROM;10 reps;Right   Balance Balance Balance Assessed: Yes Dynamic Standing Balance Dynamic Standing - Balance Support: Bilateral upper extremity supported;During functional activity Dynamic Standing - Level of Assistance: 5: Stand by assistance   End of Session OT - End of Session Equipment Utilized During Treatment: Rolling walker Activity Tolerance: Patient tolerated treatment well Patient left: Other (comment) (w PT getting ready to walk in hallway) Nurse Communication: Mobility status;Patient requests pain meds CPM Right Knee CPM Right Knee: Off  GO     Hope Budds 10/23/2013, 12:37 PM [phone]

## 2013-10-23 NOTE — Progress Notes (Signed)
   Subjective: 1 Day Post-Op Procedure(s) (LRB): RIGHT TOTAL KNEE ARTHROPLASTY (Right) Patient reports pain as mild.   Patient seen in rounds with Dr. Lequita Halt. Patient is well, and has had no acute complaints or problems.  She had a good night. We will start therapy today.  Plan is to go Home after hospital stay.  Objective: Vital signs in last 24 hours: Temp:  [98.1 F (36.7 C)-99.5 F (37.5 C)] 98.7 F (37.1 C) (10/28 0513) Pulse Rate:  [43-83] 47 (10/28 0513) Resp:  [9-18] 16 (10/28 0513) BP: (99-134)/(62-89) 102/68 mmHg (10/28 0513) SpO2:  [93 %-100 %] 100 % (10/28 0513) Weight:  [106.595 kg (235 lb)] 106.595 kg (235 lb) (10/27 1609)  Intake/Output from previous day:  Intake/Output Summary (Last 24 hours) at 10/23/13 0743 Last data filed at 10/23/13 0548  Gross per 24 hour  Intake   2940 ml  Output   2236 ml  Net    704 ml    Intake/Output this shift:    Labs:  Recent Labs  10/23/13 0452  HGB 11.1*    Recent Labs  10/23/13 0452  WBC 19.0*  RBC 3.95  HCT 32.4*  PLT 270    Recent Labs  10/23/13 0452  NA 135  K 3.8  CL 100  CO2 28  BUN 12  CREATININE 0.93  GLUCOSE 142*  CALCIUM 9.5   No results found for this basename: LABPT, INR,  in the last 72 hours  EXAM General - Patient is Alert, Appropriate and Oriented Extremity - Neurovascular intact Sensation intact distally Dorsiflexion/Plantar flexion intact Dressing - dressing C/D/I Motor Function - intact, moving foot and toes well on exam.  Hemovac pulled without difficulty.  Past Medical History  Diagnosis Date  . Hypertension   . Hyperlipidemia   . Allergy 1997    knees  . Obesity   . GERD (gastroesophageal reflux disease)   . Seasonal allergies   . Pneumonia     hx of 10 years ago  . Headache(784.0)   . Arthritis     Assessment/Plan: 1 Day Post-Op Procedure(s) (LRB): RIGHT TOTAL KNEE ARTHROPLASTY (Right) Principal Problem:   OA (osteoarthritis) of knee  Estimated body  mass index is 39.11 kg/(m^2) as calculated from the following:   Height as of this encounter: 5\' 5"  (1.651 m).   Weight as of this encounter: 106.595 kg (235 lb). Advance diet Up with therapy Plan for discharge tomorrow Discharge home with home health  DVT Prophylaxis - Xarelto Weight-Bearing as tolerated to right leg D/C O2 and Pulse OX and try on Room Air  Patrica Duel 10/23/2013, 7:43 AM

## 2013-10-23 NOTE — Care Management Note (Addendum)
    Page 1 of 2   10/24/2013     5:36:07 PM   CARE MANAGEMENT NOTE 10/24/2013  Patient:  Bethany [name], Bethany [name]   Account Number:  [id number]  Date Initiated:  10/23/2013  Documentation initiated by:  Colleen Can  Subjective/Objective Assessment:   DX TOTAL KNEE REPLACEMNT -RT     Action/Plan:   CM spoke with patient and spouse. Plans are for patient to return to her home in [facility] where spouse will be caregiver. She already has RW. Wants network agency for [facility].   Anticipated DC Date:  10/24/2013   Anticipated DC Plan:  HOME W HOME HEALTH SERVICES      DC Planning Services  CM consult      [facility] Choice  HOME HEALTH   Choice offered to / List presented to:  C-1 Patient        HH arranged  HH-2 PT      [facility] agency  [facility]   Status of service:  Completed, signed off Medicare Important Message given?   (If response is "NO", the following Medicare IM given date fields will be blank) Date Medicare IM given:   Date Additional Medicare IM given:    Discharge Disposition:  HOME W HOME HEALTH SERVICES  Per UR Regulation:  Reviewed for med. necessity/level of care/duration of stay  If discussed at Long Length of Stay Meetings, dates discussed:    Comments:  10/24/2013 Colleen Can BSN RN CCM [phone] Pt discharged today. [facility] notified and will start services tomorrow.  10/23/2013 Trish Mancinelli BSN RN CCM [phone] [facility] rep advised that they would be able to service patient with HHpt upon discharge.

## 2013-10-24 LAB — CBC
Hemoglobin: 10.6 g/dL — ABNORMAL LOW (ref 12.0–15.0)
MCH: 28.4 pg (ref 26.0–34.0)
MCHC: 34.6 g/dL (ref 30.0–36.0)
MCV: 82 fL (ref 78.0–100.0)
RBC: 3.73 MIL/uL — ABNORMAL LOW (ref 3.87–5.11)

## 2013-10-24 LAB — BASIC METABOLIC PANEL
CO2: 28 mEq/L (ref 19–32)
Calcium: 9.7 mg/dL (ref 8.4–10.5)
Creatinine, Ser: 0.93 mg/dL (ref 0.50–1.10)
Glucose, Bld: 106 mg/dL — ABNORMAL HIGH (ref 70–99)

## 2013-10-24 MED ORDER — METHOCARBAMOL 500 MG PO TABS: 500.0000 mg | ORAL_TABLET | Freq: Four times a day (QID) | ORAL | Status: DC | PRN

## 2013-10-24 MED ORDER — RIVAROXABAN 10 MG PO TABS: 10.0000 mg | ORAL_TABLET | Freq: Every day | ORAL | Status: DC

## 2013-10-24 MED ORDER — OXYCODONE HCL 5 MG PO TABS: 5.0000 mg | ORAL_TABLET | ORAL | Status: DC | PRN

## 2013-10-24 MED ORDER — TRAMADOL HCL 50 MG PO TABS: 50.0000 mg | ORAL_TABLET | Freq: Four times a day (QID) | ORAL | Status: DC | PRN

## 2013-10-24 NOTE — Progress Notes (Signed)
   Subjective: 2 Days Post-Op Procedure(s) (LRB): RIGHT TOTAL KNEE ARTHROPLASTY (Right) Patient reports pain as mild.   Patient seen in rounds with Dr. Lequita Halt. Patient is well, and has had no acute complaints or problems Patient is ready to go home  Objective: Vital signs in last 24 hours: Temp:  [98.7 F (37.1 C)-99.4 F (37.4 C)] 99.4 F (37.4 C) (10/29 0501) Pulse Rate:  [57-74] 66 (10/29 0501) Resp:  [14-16] 14 (10/29 0501) BP: (123-138)/(67-79) 136/76 mmHg (10/29 0501) SpO2:  [93 %-97 %] 93 % (10/29 0501)  Intake/Output from previous day:  Intake/Output Summary (Last 24 hours) at 10/24/13 1036 Last data filed at 10/23/13 1842  Gross per 24 hour  Intake    240 ml  Output   1300 ml  Net  -1060 ml    Intake/Output this shift:    Labs:  Recent Labs  10/23/13 0452 10/24/13 0530  HGB 11.1* 10.6*    Recent Labs  10/23/13 0452 10/24/13 0530  WBC 19.0* 20.3*  RBC 3.95 3.73*  HCT 32.4* 30.6*  PLT 270 279    Recent Labs  10/23/13 0452 10/24/13 0530  NA 135 138  K 3.8 3.4*  CL 100 104  CO2 28 28  BUN 12 14  CREATININE 0.93 0.93  GLUCOSE 142* 106*  CALCIUM 9.5 9.7   No results found for this basename: LABPT, INR,  in the last 72 hours  EXAM: General - Patient is Alert, Appropriate and Oriented Extremity - Neurovascular intact Sensation intact distally Dorsiflexion/Plantar flexion intact Incision - clean, dry, no drainage, healing Motor Function - intact, moving foot and toes well on exam.   Assessment/Plan: 2 Days Post-Op Procedure(s) (LRB): RIGHT TOTAL KNEE ARTHROPLASTY (Right) Procedure(s) (LRB): RIGHT TOTAL KNEE ARTHROPLASTY (Right) Past Medical History  Diagnosis Date  . Hypertension   . Hyperlipidemia   . Allergy 1997    knees  . Obesity   . GERD (gastroesophageal reflux disease)   . Seasonal allergies   . Pneumonia     hx of 10 years ago  . Headache(784.0)   . Arthritis    Principal Problem:   OA (osteoarthritis) of  knee  Estimated body mass index is 39.11 kg/(m^2) as calculated from the following:   Height as of this encounter: 5\' 5"  (1.651 m).   Weight as of this encounter: 106.595 kg (235 lb). Up with therapy Discharge home with home health Diet - Cardiac diet Follow up - in 2 weeks Activity - WBAT Disposition - Home Condition Upon Discharge - Good D/C Meds - See DC Summary DVT Prophylaxis - Xarelto  PERKINS, ALEXZANDREW 10/24/2013, 10:36 AM

## 2013-10-24 NOTE — Progress Notes (Signed)
Physical Therapy Treatment Patient Details Name: Bethany [name] MRN: [id number] DOB: 02-Mar-1951 Today's Date: 10/24/2013 Time: 1036-1100 PT Time Calculation (min): 24 min  PT Assessment / Plan / Recommendation  History of Present Illness s/p R TKA   PT Comments   Pt doing well this am  Follow Up Recommendations  Home health PT     Does the patient have the potential to tolerate intense rehabilitation     Barriers to Discharge        Equipment Recommendations  None recommended by PT    Recommendations for Other Services    Frequency 7X/week   Progress towards PT Goals Progress towards PT goals: Progressing toward goals  Plan Current plan remains appropriate    Precautions / Restrictions Precautions Precautions: Knee Precaution Comments: I SLR, KI not used Restrictions Weight Bearing Restrictions: No Other Position/Activity Restrictions: WBAT   Pertinent Vitals/Pain VSS    Mobility  Bed Mobility Bed Mobility: Supine to Sit;Sit to Supine Supine to Sit: 4: Min guard Sit to Supine: 4: Min guard Details for Bed Mobility Assistance: cues for safety Transfers Transfers: Sit to Stand;Stand to Sit Sit to Stand: 5: Supervision;From bed;From toilet Stand to Sit: 5: Supervision;To bed;To toilet Details for Transfer Assistance: cues for hand placement and RLE position Ambulation/Gait Ambulation/Gait Assistance: 5: Supervision Ambulation Distance (Feet): 80 Feet Assistive device: Rolling walker Ambulation/Gait Assistance Details: cues for step through Gait Pattern: Step-through pattern    Exercises Total Joint Exercises Ankle Circles/Pumps: AROM;Both;10 reps Quad Sets: AROM;10 reps;Both Heel Slides: AROM;10 reps;Right Hip ABduction/ADduction: AROM;Right;10 reps Straight Leg Raises: AROM;Right;10 reps Goniometric ROM: 78   PT Diagnosis:    PT Problem List:   PT Treatment Interventions:     PT Goals (current goals can now be found in the care plan section) Acute  Rehab PT Goals Patient Stated Goal: home, return to I  Time For Goal Achievement: 10/30/13  Visit Information  Last PT Received On: 10/24/13 Assistance Needed: +1 History of Present Illness: s/p R TKA    Subjective Data  Patient Stated Goal: home, return to I    Cognition  Cognition Arousal/Alertness: Awake/alert Behavior During Therapy: WFL for tasks assessed/performed Overall Cognitive Status: Within Functional Limits for tasks assessed    Balance     End of Session PT - End of Session Activity Tolerance: Patient tolerated treatment well Patient left: in bed;with call bell/phone within reach;with family/visitor present Nurse Communication: Mobility status CPM Right Knee CPM Right Knee: Off   GP     [facility] 10/24/2013, 11:18 AM

## 2013-10-24 NOTE — Discharge Summary (Signed)
Physician Discharge Summary   Patient ID: Bethany [name] MRN: [id number] DOB/AGE: December 30, 1950 62 y.o.  Admit date: 10/22/2013 Discharge date: 10/24/2013  Primary Diagnosis:  Osteoarthritis Right knee(s)  Admission Diagnoses:  Past Medical History  Diagnosis Date  . Hypertension   . Hyperlipidemia   . Allergy 1997    knees  . Obesity   . GERD (gastroesophageal reflux disease)   . Seasonal allergies   . Pneumonia     hx of 10 years ago  . Headache(784.0)   . Arthritis    Discharge Diagnoses:   Principal Problem:   OA (osteoarthritis) of knee  Estimated body mass index is 39.11 kg/(m^2) as calculated from the following:   Height as of this encounter: 5\' 5"  (1.651 m).   Weight as of this encounter: 106.595 kg (235 lb).  Procedure:  Procedure(s) (LRB): RIGHT TOTAL KNEE ARTHROPLASTY (Right)   Consults: None  HPI: Bethany [name] is a 63 y.o. year old female with end stage OA of her right knee with progressively worsening pain and dysfunction. She has constant pain, with activity and at rest and significant functional deficits with difficulties even with ADLs. She has had extensive non-op management including analgesics, injections of cortisone and viscosupplements, and home exercise program, but remains in significant pain with significant dysfunction.Radiographs show bone on bone arthritis medial and patellofemoral. She presents now for right Total Knee Arthroplasty.   Laboratory Data: Admission on 10/22/2013, Discharged on 10/24/2013  Component Date Value Range Status  . WBC 10/23/2013 19.0* 4.0 - 10.5 K/uL Final  . RBC 10/23/2013 3.95  3.87 - 5.11 MIL/uL Final  . Hemoglobin 10/23/2013 11.1* 12.0 - 15.0 g/dL Final  . HCT 40/98/1191 32.4* 36.0 - 46.0 % Final  . MCV 10/23/2013 82.0  78.0 - 100.0 fL Final  . MCH 10/23/2013 28.1  26.0 - 34.0 pg Final  . MCHC 10/23/2013 34.3  30.0 - 36.0 g/dL Final  . RDW 47/82/9562 13.9  11.5 - 15.5 % Final  . Platelets 10/23/2013 270  150  - 400 K/uL Final  . Sodium 10/23/2013 135  135 - 145 mEq/L Final  . Potassium 10/23/2013 3.8  3.5 - 5.1 mEq/L Final  . Chloride 10/23/2013 100  96 - 112 mEq/L Final  . CO2 10/23/2013 28  19 - 32 mEq/L Final  . Glucose, Bld 10/23/2013 142* 70 - 99 mg/dL Final  . BUN 13/07/6577 12  6 - 23 mg/dL Final  . Creatinine, Ser 10/23/2013 0.93  0.50 - 1.10 mg/dL Final  . Calcium 46/96/2952 9.5  8.4 - 10.5 mg/dL Final  . GFR calc non Af Amer 10/23/2013 64* >90 mL/min Final  . GFR calc Af Amer 10/23/2013 74* >90 mL/min Final   Comment: (NOTE)                          The eGFR has been calculated using the CKD EPI equation.                          This calculation has not been validated in all clinical situations.                          eGFR's persistently <90 mL/min signify possible Chronic Kidney                          Disease.  . WBC 10/24/2013  20.3* 4.0 - 10.5 K/uL Final  . RBC 10/24/2013 3.73* 3.87 - 5.11 MIL/uL Final  . Hemoglobin 10/24/2013 10.6* 12.0 - 15.0 g/dL Final  . HCT 16/09/9603 30.6* 36.0 - 46.0 % Final  . MCV 10/24/2013 82.0  78.0 - 100.0 fL Final  . MCH 10/24/2013 28.4  26.0 - 34.0 pg Final  . MCHC 10/24/2013 34.6  30.0 - 36.0 g/dL Final  . RDW 54/08/8118 14.2  11.5 - 15.5 % Final  . Platelets 10/24/2013 279  150 - 400 K/uL Final  . Sodium 10/24/2013 138  135 - 145 mEq/L Final  . Potassium 10/24/2013 3.4* 3.5 - 5.1 mEq/L Final  . Chloride 10/24/2013 104  96 - 112 mEq/L Final  . CO2 10/24/2013 28  19 - 32 mEq/L Final  . Glucose, Bld 10/24/2013 106* 70 - 99 mg/dL Final  . BUN 14/78/2956 14  6 - 23 mg/dL Final  . Creatinine, Ser 10/24/2013 0.93  0.50 - 1.10 mg/dL Final  . Calcium 21/30/8657 9.7  8.4 - 10.5 mg/dL Final  . GFR calc non Af Amer 10/24/2013 64* >90 mL/min Final  . GFR calc Af Amer 10/24/2013 74* >90 mL/min Final   Comment: (NOTE)                          The eGFR has been calculated using the CKD EPI equation.                          This calculation has not  been validated in all clinical situations.                          eGFR's persistently <90 mL/min signify possible Chronic Kidney                          Disease.  Hospital Outpatient Visit on 10/16/2013  Component Date Value Range Status  . MRSA, PCR 10/16/2013 NEGATIVE  NEGATIVE Final  . Staphylococcus aureus 10/16/2013 NEGATIVE  NEGATIVE Final   Comment:                                 The Xpert SA Assay (FDA                          approved for NASAL specimens                          in patients over 21 years of age),                          is one component of                          a comprehensive surveillance                          program.  Test performance has                          been validated by [employer] [facility] for patients greater                          than or equal to 44 year old.                          It is not intended                          to diagnose infection nor to                          guide or monitor treatment.  Marland Kitchen aPTT 10/16/2013 38* 24 - 37 seconds Final   Comment:                                 IF BASELINE aPTT IS ELEVATED,                          SUGGEST PATIENT RISK ASSESSMENT                          BE USED TO DETERMINE APPROPRIATE                          ANTICOAGULANT THERAPY.  . WBC 10/16/2013 11.2* 4.0 - 10.5 K/uL Final  . RBC 10/16/2013 4.38  3.87 - 5.11 MIL/uL Final  . Hemoglobin 10/16/2013 12.3  12.0 - 15.0 g/dL Final  . HCT 78/29/5621 36.1  36.0 - 46.0 % Final  . MCV 10/16/2013 82.4  78.0 - 100.0 fL Final  . MCH 10/16/2013 28.1  26.0 - 34.0 pg Final  . MCHC 10/16/2013 34.1  30.0 - 36.0 g/dL Final  . RDW 30/86/5784 14.2  11.5 - 15.5 % Final  . Platelets 10/16/2013 287  150 - 400 K/uL Final  . Sodium 10/16/2013 139  135 - 145 mEq/L Final  . Potassium 10/16/2013 3.5  3.5 - 5.1 mEq/L Final  . Chloride 10/16/2013 102  96 - 112 mEq/L Final  . CO2 10/16/2013 26  19 - 32 mEq/L Final  . Glucose, Bld  10/16/2013 101* 70 - 99 mg/dL Final  . BUN 69/62/9528 11  6 - 23 mg/dL Final  . Creatinine, Ser 10/16/2013 0.88  0.50 - 1.10 mg/dL Final  . Calcium 41/32/4401 10.2  8.4 - 10.5 mg/dL Final  . Total Protein 10/16/2013 7.7  6.0 - 8.3 g/dL Final  . Albumin 02/72/5366 3.8  3.5 - 5.2 g/dL Final  . AST 44/02/4741 27  0 - 37 U/L Final  . ALT 10/16/2013 14  0 - 35 U/L Final  . Alkaline Phosphatase 10/16/2013 82  39 - 117 U/L Final  . Total Bilirubin 10/16/2013 0.9  0.3 - 1.2 mg/dL Final  . GFR calc non Af Amer 10/16/2013 68* >90 mL/min Final  . GFR calc Af Amer 10/16/2013 79* >90 mL/min Final   Comment: (NOTE)                          The eGFR has been calculated using the CKD EPI equation.  This calculation has not been validated in all clinical situations.                          eGFR's persistently <90 mL/min signify possible Chronic Kidney                          Disease.  Marland Kitchen Prothrombin Time 10/16/2013 12.8  11.6 - 15.2 seconds Final  . INR 10/16/2013 0.98  0.00 - 1.49 Final  . ABO/RH(D) 10/16/2013 O NEG   Final  . Antibody Screen 10/16/2013 NEG   Final  . Sample Expiration 10/16/2013 10/25/2013   Final  . Color, Urine 10/16/2013 YELLOW  YELLOW Final  . APPearance 10/16/2013 CLEAR  CLEAR Final  . Specific Gravity, Urine 10/16/2013 1.014  1.005 - 1.030 Final  . pH 10/16/2013 6.5  5.0 - 8.0 Final  . Glucose, UA 10/16/2013 NEGATIVE  NEGATIVE mg/dL Final  . Hgb urine dipstick 10/16/2013 NEGATIVE  NEGATIVE Final  . Bilirubin Urine 10/16/2013 NEGATIVE  NEGATIVE Final  . Ketones, ur 10/16/2013 NEGATIVE  NEGATIVE mg/dL Final  . Protein, ur 16/09/9603 NEGATIVE  NEGATIVE mg/dL Final  . Urobilinogen, UA 10/16/2013 1.0  0.0 - 1.0 mg/dL Final  . Nitrite 54/08/8118 NEGATIVE  NEGATIVE Final  . Leukocytes, UA 10/16/2013 NEGATIVE  NEGATIVE Final   MICROSCOPIC NOT DONE ON URINES WITH NEGATIVE PROTEIN, BLOOD, LEUKOCYTES, NITRITE, OR GLUCOSE <1000 mg/dL.  . ABO/RH(D) 10/16/2013 O  NEG   Final     X-Rays:Dg Chest 2 View  10/16/2013   CLINICAL DATA:  Right total knee arthroplasty 10/22/2013.  EXAM: CHEST  2 VIEW  COMPARISON:  None.  FINDINGS: Cardiac silhouette is within normal limits. Tortuous thoracic aorta is noted. No focal consolidation or edema. No pleural effusion or pneumothorax. No acute osseous abnormality.  IMPRESSION: No active cardiopulmonary disease.   Electronically Signed   By: Jerene Dilling M.D.   On: 10/16/2013 11:10    EKG: Orders placed during the hospital encounter of 10/22/13  . EKG     Hospital Course: Bethany [name] is a 62 y.o. who was admitted to [facility]. They were brought to the operating room on 10/22/2013 and underwent Procedure(s): RIGHT TOTAL KNEE ARTHROPLASTY.  Patient tolerated the procedure well and was later transferred to the recovery room and then to the orthopaedic floor for postoperative care.  They were given PO and IV analgesics for pain control following their surgery.  They were given 24 hours of postoperative antibiotics of  Anti-infectives   Start     Dose/Rate Route Frequency Ordered Stop   10/22/13 1730  ceFAZolin (ANCEF) IVPB 1 g/50 mL premix     1 g 100 mL/hr over 30 Minutes Intravenous Every 6 hours 10/22/13 1519 10/22/13 2245   10/22/13 1519  acyclovir (ZOVIRAX) 200 MG capsule 200 mg  Status:  Discontinued     200 mg Oral Daily PRN 10/22/13 1519 10/24/13 1614   10/22/13 0800  ceFAZolin (ANCEF) IVPB 2 g/50 mL premix     2 g 100 mL/hr over 30 Minutes Intravenous On call to O.R. 10/22/13 1478 10/22/13 1156     and started on DVT prophylaxis in the form of Xarelto.   PT and OT were ordered for total joint protocol.  Discharge planning consulted to help with postop disposition and equipment needs.  Patient had a good night on the evening of surgery.  They started to get up OOB with  therapy on day one walking over 100 feet. Hemovac drain was pulled without difficulty.  Continued to work with therapy into  day two.  Dressing was changed on day two and the incision was healing well.  Patient was seen in rounds and was ready to go home later that afternoon.   Discharge Medications: Prior to Admission medications   Medication Sig Start Date End Date Taking? Authorizing Provider  acyclovir (ZOVIRAX) 200 MG capsule Take 200 mg by mouth daily as needed (for outbreaks).    Yes Historical Provider, MD  celecoxib (CELEBREX) 200 MG capsule Take 1 capsule (200 mg total) by mouth daily. 07/02/13  Yes Ileana Ladd, MD  lisinopril-hydrochlorothiazide (PRINZIDE,ZESTORETIC) 20-25 MG per tablet Take 1 tablet by mouth every evening.    Yes Historical Provider, MD  pravastatin (PRAVACHOL) 40 MG tablet Take 40 mg by mouth every morning.   Yes Historical Provider, MD  methocarbamol (ROBAXIN) 500 MG tablet Take 1 tablet (500 mg total) by mouth every 6 (six) hours as needed. 10/24/13   Marlyce Mcdougald, PA-C  oxyCODONE (OXY IR/ROXICODONE) 5 MG immediate release tablet Take 1-2 tablets (5-10 mg total) by mouth every 3 (three) hours as needed. 10/24/13   Valari Taylor Julien Girt, PA-C  rivaroxaban (XARELTO) 10 MG TABS tablet Take 1 tablet (10 mg total) by mouth daily with breakfast. Take Xarelto for two and a half more weeks, then discontinue Xarelto. Once the patient has completed the blood thinner regimen, then take a Baby 81 mg Aspirin daily for four more weeks. 10/24/13   Nakiya Rallis, PA-C  traMADol (ULTRAM) 50 MG tablet Take 1-2 tablets (50-100 mg total) by mouth every 6 (six) hours as needed (mild pain). 10/24/13   Monica Zahler Julien Girt, PA-C   Discharge home with home health  Diet - Cardiac diet  Follow up - in 2 weeks  Activity - WBAT  Disposition - Home  Condition Upon Discharge - Good  D/C Meds - See DC Summary  DVT Prophylaxis - Xarelto       Discharge Orders   Future Appointments Provider Department Dept Phone   12/03/2013 11:00 AM Barbaraann Share, MD [facility] [phone]   Future  Orders Complete By Expires   Call MD / Call 911  As directed    Comments:     If you experience chest pain or shortness of breath, CALL 911 and be transported to the hospital emergency room.  If you develope a fever above 101 F, pus (white drainage) or increased drainage or redness at the wound, or calf pain, call your surgeon's office.   Change dressing  As directed    Comments:     Change dressing daily with sterile 4 x 4 inch gauze dressing and apply TED hose. Do not submerge the incision under water.   Constipation Prevention  As directed    Comments:     Drink plenty of fluids.  Prune juice may be helpful.  You may use a stool softener, such as Colace (over the counter) 100 mg twice a day.  Use MiraLax (over the counter) for constipation as needed.   Diet - low sodium heart healthy  As directed    Discharge instructions  As directed    Comments:     Pick up stool softner and laxative for home. Do not submerge incision under water. May shower. Continue to use ice for pain and swelling from surgery.  Take Xarelto for two and a half more weeks, then discontinue Xarelto. Once the  patient has completed the blood thinner regimen, then take a Baby 81 mg Aspirin daily for four more weeks.   Do not put a pillow under the knee. Place it under the heel.  As directed    Do not sit on low chairs, stoools or toilet seats, as it may be difficult to get up from low surfaces  As directed    Driving restrictions  As directed    Comments:     No driving until released by the physician.   Increase activity slowly as tolerated  As directed    Lifting restrictions  As directed    Comments:     No lifting until released by the physician.   Patient may shower  As directed    Comments:     You may shower without a dressing once there is no drainage.  Do not wash over the wound.  If drainage remains, do not shower until drainage stops.   TED hose  As directed    Comments:     Use stockings (TED hose)  for 3 weeks on both leg(s).  You may remove them at night for sleeping.   Weight bearing as tolerated  As directed    Questions:     Laterality:     Extremity:         Medication List    STOP taking these medications       acidophilus Caps capsule     OSTEO BI-FLEX ADV TRIPLE ST PO     vitamin B-12 100 MCG tablet  Commonly known as:  CYANOCOBALAMIN      TAKE these medications       acyclovir 200 MG capsule  Commonly known as:  ZOVIRAX  Take 200 mg by mouth daily as needed (for outbreaks).     celecoxib 200 MG capsule  Commonly known as:  CELEBREX  Take 1 capsule (200 mg total) by mouth daily.     lisinopril-hydrochlorothiazide 20-25 MG per tablet  Commonly known as:  PRINZIDE,ZESTORETIC  Take 1 tablet by mouth every evening.     methocarbamol 500 MG tablet  Commonly known as:  ROBAXIN  Take 1 tablet (500 mg total) by mouth every 6 (six) hours as needed.     oxyCODONE 5 MG immediate release tablet  Commonly known as:  Oxy IR/ROXICODONE  Take 1-2 tablets (5-10 mg total) by mouth every 3 (three) hours as needed.     pravastatin 40 MG tablet  Commonly known as:  PRAVACHOL  Take 40 mg by mouth every morning.     rivaroxaban 10 MG Tabs tablet  Commonly known as:  XARELTO  - Take 1 tablet (10 mg total) by mouth daily with breakfast. Take Xarelto for two and a half more weeks, then discontinue Xarelto.  - Once the patient has completed the blood thinner regimen, then take a Baby 81 mg Aspirin daily for four more weeks.     traMADol 50 MG tablet  Commonly known as:  ULTRAM  Take 1-2 tablets (50-100 mg total) by mouth every 6 (six) hours as needed (mild pain).       Follow-up Information   Follow up with Loanne Drilling, MD. Schedule an appointment as soon as possible for a visit in 2 weeks.   Specialty:  Orthopedic Surgery   Contact information:   [address] [place] [address] [phone]       Signed: Patrica Duel 11/01/2013, 9:48 AM

## 2013-10-25 NOTE — Progress Notes (Signed)
Utilization review completed.  

## 2013-11-19 ENCOUNTER — Ambulatory Visit: Payer: PRIVATE HEALTH INSURANCE | Attending: Orthopedic Surgery | Admitting: Physical Therapy

## 2013-11-19 DIAGNOSIS — M25569 Pain in unspecified knee: Secondary | ICD-10-CM | POA: Insufficient documentation

## 2013-11-19 DIAGNOSIS — R262 Difficulty in walking, not elsewhere classified: Secondary | ICD-10-CM | POA: Insufficient documentation

## 2013-11-19 DIAGNOSIS — R5381 Other malaise: Secondary | ICD-10-CM | POA: Insufficient documentation

## 2013-11-19 DIAGNOSIS — IMO0001 Reserved for inherently not codable concepts without codable children: Secondary | ICD-10-CM | POA: Insufficient documentation

## 2013-11-19 DIAGNOSIS — M25669 Stiffness of unspecified knee, not elsewhere classified: Secondary | ICD-10-CM | POA: Insufficient documentation

## 2013-11-20 ENCOUNTER — Ambulatory Visit: Payer: PRIVATE HEALTH INSURANCE | Admitting: Physical Therapy

## 2013-11-26 ENCOUNTER — Ambulatory Visit: Payer: PRIVATE HEALTH INSURANCE | Attending: Orthopedic Surgery | Admitting: Physical Therapy

## 2013-11-26 DIAGNOSIS — M25569 Pain in unspecified knee: Secondary | ICD-10-CM | POA: Insufficient documentation

## 2013-11-26 DIAGNOSIS — R5381 Other malaise: Secondary | ICD-10-CM | POA: Insufficient documentation

## 2013-11-26 DIAGNOSIS — R262 Difficulty in walking, not elsewhere classified: Secondary | ICD-10-CM | POA: Insufficient documentation

## 2013-11-26 DIAGNOSIS — M25669 Stiffness of unspecified knee, not elsewhere classified: Secondary | ICD-10-CM | POA: Insufficient documentation

## 2013-11-26 DIAGNOSIS — IMO0001 Reserved for inherently not codable concepts without codable children: Secondary | ICD-10-CM | POA: Insufficient documentation

## 2013-11-28 ENCOUNTER — Ambulatory Visit: Payer: PRIVATE HEALTH INSURANCE | Admitting: Physical Therapy

## 2013-11-30 ENCOUNTER — Encounter: Payer: PRIVATE HEALTH INSURANCE | Admitting: *Deleted

## 2013-12-03 ENCOUNTER — Ambulatory Visit: Payer: PRIVATE HEALTH INSURANCE | Admitting: *Deleted

## 2013-12-03 ENCOUNTER — Institutional Professional Consult (permissible substitution): Payer: PRIVATE HEALTH INSURANCE | Admitting: Pulmonary Disease

## 2013-12-05 ENCOUNTER — Ambulatory Visit: Payer: PRIVATE HEALTH INSURANCE | Admitting: Physical Therapy

## 2013-12-07 ENCOUNTER — Ambulatory Visit: Payer: PRIVATE HEALTH INSURANCE | Admitting: Physical Therapy

## 2013-12-10 ENCOUNTER — Ambulatory Visit: Payer: PRIVATE HEALTH INSURANCE | Admitting: Physical Therapy

## 2013-12-12 ENCOUNTER — Ambulatory Visit: Payer: PRIVATE HEALTH INSURANCE | Admitting: Physical Therapy

## 2013-12-14 ENCOUNTER — Ambulatory Visit: Payer: PRIVATE HEALTH INSURANCE | Admitting: Physical Therapy

## 2013-12-18 ENCOUNTER — Ambulatory Visit: Payer: PRIVATE HEALTH INSURANCE | Admitting: Physical Therapy

## 2013-12-19 ENCOUNTER — Ambulatory Visit: Payer: PRIVATE HEALTH INSURANCE | Admitting: Physical Therapy

## 2013-12-24 ENCOUNTER — Ambulatory Visit: Payer: PRIVATE HEALTH INSURANCE | Admitting: Physical Therapy

## 2013-12-26 ENCOUNTER — Ambulatory Visit: Payer: PRIVATE HEALTH INSURANCE | Admitting: Physical Therapy

## 2013-12-28 ENCOUNTER — Ambulatory Visit: Payer: PRIVATE HEALTH INSURANCE | Attending: Orthopedic Surgery | Admitting: Physical Therapy

## 2013-12-28 DIAGNOSIS — IMO0001 Reserved for inherently not codable concepts without codable children: Secondary | ICD-10-CM | POA: Insufficient documentation

## 2013-12-28 DIAGNOSIS — M25669 Stiffness of unspecified knee, not elsewhere classified: Secondary | ICD-10-CM | POA: Insufficient documentation

## 2013-12-28 DIAGNOSIS — R262 Difficulty in walking, not elsewhere classified: Secondary | ICD-10-CM | POA: Insufficient documentation

## 2013-12-28 DIAGNOSIS — R5381 Other malaise: Secondary | ICD-10-CM | POA: Insufficient documentation

## 2013-12-28 DIAGNOSIS — M25569 Pain in unspecified knee: Secondary | ICD-10-CM | POA: Insufficient documentation

## 2013-12-31 ENCOUNTER — Ambulatory Visit: Payer: PRIVATE HEALTH INSURANCE | Admitting: Physical Therapy

## 2014-01-02 ENCOUNTER — Ambulatory Visit: Payer: PRIVATE HEALTH INSURANCE | Admitting: Physical Therapy

## 2014-01-04 ENCOUNTER — Ambulatory Visit: Payer: PRIVATE HEALTH INSURANCE | Admitting: *Deleted

## 2014-01-07 ENCOUNTER — Ambulatory Visit: Payer: PRIVATE HEALTH INSURANCE | Admitting: Physical Therapy

## 2014-01-09 ENCOUNTER — Ambulatory Visit: Payer: PRIVATE HEALTH INSURANCE | Admitting: Physical Therapy

## 2014-01-11 ENCOUNTER — Ambulatory Visit: Payer: PRIVATE HEALTH INSURANCE | Admitting: *Deleted

## 2014-01-14 ENCOUNTER — Ambulatory Visit: Payer: PRIVATE HEALTH INSURANCE | Admitting: Physical Therapy

## 2014-01-16 ENCOUNTER — Encounter: Payer: PRIVATE HEALTH INSURANCE | Admitting: Physical Therapy

## 2014-01-18 ENCOUNTER — Encounter: Payer: PRIVATE HEALTH INSURANCE | Admitting: Physical Therapy

## 2014-01-31 ENCOUNTER — Other Ambulatory Visit: Payer: Self-pay | Admitting: *Deleted

## 2014-01-31 MED ORDER — LISINOPRIL-HYDROCHLOROTHIAZIDE 20-25 MG PO TABS: 1.0000 | ORAL_TABLET | Freq: Every evening | ORAL | Status: DC

## 2014-01-31 MED ORDER — ACYCLOVIR 200 MG PO CAPS: 400.0000 mg | ORAL_CAPSULE | Freq: Three times a day (TID) | ORAL | Status: DC

## 2014-01-31 NOTE — Telephone Encounter (Signed)
Call patient : Prescription refilled & sent to pharmacy in [facility]. 

## 2014-03-12 ENCOUNTER — Emergency Department (HOSPITAL_COMMUNITY)
Admission: EM | Admit: 2014-03-12 | Discharge: 2014-03-12 | Disposition: A | Discharge status: Home / Self Care | Payer: 59 | Attending: Emergency Medicine | Admitting: Emergency Medicine

## 2014-03-12 ENCOUNTER — Encounter (HOSPITAL_COMMUNITY): Payer: Self-pay | Admitting: Emergency Medicine

## 2014-03-12 ENCOUNTER — Ambulatory Visit (INDEPENDENT_AMBULATORY_CARE_PROVIDER_SITE_OTHER): Payer: 59 | Admitting: Family Medicine

## 2014-03-12 ENCOUNTER — Encounter: Payer: Self-pay | Admitting: Family Medicine

## 2014-03-12 ENCOUNTER — Emergency Department (HOSPITAL_COMMUNITY): Payer: 59

## 2014-03-12 VITALS — BP 116/82 | HR 90 | Temp 99.2°F | Ht 65.0 in | Wt 233.8 lb

## 2014-03-12 DIAGNOSIS — E785 Hyperlipidemia, unspecified: Secondary | ICD-10-CM | POA: Insufficient documentation

## 2014-03-12 DIAGNOSIS — R079 Chest pain, unspecified: Secondary | ICD-10-CM

## 2014-03-12 DIAGNOSIS — Z79899 Other long term (current) drug therapy: Secondary | ICD-10-CM | POA: Insufficient documentation

## 2014-03-12 DIAGNOSIS — M129 Arthropathy, unspecified: Secondary | ICD-10-CM | POA: Insufficient documentation

## 2014-03-12 DIAGNOSIS — R0789 Other chest pain: Secondary | ICD-10-CM | POA: Insufficient documentation

## 2014-03-12 DIAGNOSIS — I10 Essential (primary) hypertension: Secondary | ICD-10-CM | POA: Insufficient documentation

## 2014-03-12 DIAGNOSIS — E669 Obesity, unspecified: Secondary | ICD-10-CM | POA: Insufficient documentation

## 2014-03-12 DIAGNOSIS — R9431 Abnormal electrocardiogram [ECG] [EKG]: Secondary | ICD-10-CM | POA: Insufficient documentation

## 2014-03-12 DIAGNOSIS — Z8719 Personal history of other diseases of the digestive system: Secondary | ICD-10-CM | POA: Insufficient documentation

## 2014-03-12 DIAGNOSIS — Z8701 Personal history of pneumonia (recurrent): Secondary | ICD-10-CM | POA: Insufficient documentation

## 2014-03-12 LAB — TROPONIN I: Troponin I: <0.3 ng/mL (ref <0.30)

## 2014-03-12 LAB — COMPREHENSIVE METABOLIC PANEL
ALBUMIN: 3.7 g/dL (ref 3.5–5.2)
ALT: 11 U/L (ref 0–35)
AST: 22 U/L (ref 0–37)
Alkaline Phosphatase: 89 U/L (ref 39–117)
BUN: 10 mg/dL (ref 6–23)
CALCIUM: 9.9 mg/dL (ref 8.4–10.5)
CO2: 27 mEq/L (ref 19–32)
Chloride: 102 mEq/L (ref 96–112)
Creatinine, Ser: 0.83 mg/dL (ref 0.50–1.10)
GFR calc Af Amer: 85 mL/min — ABNORMAL LOW (ref >90)
GFR calc non Af Amer: 73 mL/min — ABNORMAL LOW (ref >90)
Glucose, Bld: 97 mg/dL (ref 70–99)
POTASSIUM: 4.1 meq/L (ref 3.7–5.3)
Sodium: 142 mEq/L (ref 137–147)
TOTAL PROTEIN: 7.3 g/dL (ref 6.0–8.3)
Total Bilirubin: 0.8 mg/dL (ref 0.3–1.2)

## 2014-03-12 LAB — CBC WITH DIFFERENTIAL/PLATELET
BASOS PCT: 0 % (ref 0–1)
Basophils Absolute: 0 10*3/uL (ref 0.0–0.1)
EOS ABS: 0.1 10*3/uL (ref 0.0–0.7)
Eosinophils Relative: 1 % (ref 0–5)
HCT: 35.9 % — ABNORMAL LOW (ref 36.0–46.0)
Hemoglobin: 12.4 g/dL (ref 12.0–15.0)
Lymphocytes Relative: 21 % (ref 12–46)
Lymphs Abs: 2.6 10*3/uL (ref 0.7–4.0)
MCH: 28.7 pg (ref 26.0–34.0)
MCHC: 34.5 g/dL (ref 30.0–36.0)
MCV: 83.1 fL (ref 78.0–100.0)
MONOS PCT: 7 % (ref 3–12)
Monocytes Absolute: 0.9 10*3/uL (ref 0.1–1.0)
NEUTROS PCT: 72 % (ref 43–77)
Neutro Abs: 8.9 10*3/uL — ABNORMAL HIGH (ref 1.7–7.7)
Platelets: 277 10*3/uL (ref 150–400)
RBC: 4.32 MIL/uL (ref 3.87–5.11)
RDW: 14.4 % (ref 11.5–15.5)
WBC: 12.5 10*3/uL — ABNORMAL HIGH (ref 4.0–10.5)

## 2014-03-12 MED ORDER — ASPIRIN 81 MG PO CHEW: 81.0000 mg | CHEWABLE_TABLET | Freq: Every day | ORAL | Status: DC

## 2014-03-12 MED ORDER — OMEPRAZOLE 20 MG PO CPDR: 20.0000 mg | DELAYED_RELEASE_CAPSULE | Freq: Every day | ORAL | Status: DC

## 2014-03-12 MED ORDER — ASPIRIN 81 MG PO CHEW: 324.0000 mg | CHEWABLE_TABLET | Freq: Once | ORAL | Status: DC

## 2014-03-12 NOTE — ED Provider Notes (Signed)
CSN: [id number]     Arrival date & time 03/12/14  1539 History   First MD Initiated Contact with Patient 03/12/14 1542     Chief Complaint  Patient presents with  . Chest Pain     (Consider location/radiation/quality/duration/timing/severity/associated sxs/prior Treatment) HPI Comments: From PCPs office with chest pain past 3 days, the pain is intermittent, left-sided and radiates to her back and jaw area is not improved with Tums or rolaids. She's not had any pain for the past 2 days. She's had this pain on and off for the past several years, but more frequently for the past several days. She denies any shortness of breath, cough, fever, dizziness or lightheadedness. No leg pain or leg swelling. Reports having a stress test many years ago. Denies having an MI. He has a history of hypertension, hyperlipidemia and does not smoke. She has had knee surgery last October.  The history is provided by the patient and the EMS personnel.    Past Medical History  Diagnosis Date  . Hypertension   . Hyperlipidemia   . Allergy 1997    knees  . Obesity   . GERD (gastroesophageal reflux disease)   . Seasonal allergies   . Pneumonia     hx of 10 years ago  . Headache(784.0)   . Arthritis    Past Surgical History  Procedure Laterality Date  . Abdominal hysterectomy    . Tubal ligation    . Phlebectomy    . Total knee arthroplasty Right 10/22/2013    Procedure: RIGHT TOTAL KNEE ARTHROPLASTY;  Surgeon: Gearlean Alf, MD;  Location: [facility];  Service: Orthopedics;  Laterality: Right;   Family History  Problem Relation Age of Onset  . Heart disease Mother   . Diabetes Sister   . Arthritis Brother     knees  . Heart disease Sister     tumor in the heart   History  Substance Use Topics  . Smoking status: Never Smoker   . Smokeless tobacco: Never Used  . Alcohol Use: No   OB History   Grav Para Term Preterm Abortions TAB SAB Ect Mult Living                 Review of Systems   Constitutional: Negative for fever, activity change and appetite change.  HENT: Negative for congestion and rhinorrhea.   Respiratory: Positive for chest tightness. Negative for cough and shortness of breath.   Cardiovascular: Positive for chest pain.  Gastrointestinal: Negative for nausea, vomiting and abdominal pain.  Genitourinary: Negative for dysuria, hematuria, vaginal bleeding and vaginal discharge.  Musculoskeletal: Negative for back pain.  Skin: Negative for rash.  Neurological: Negative for dizziness, weakness and headaches.  A complete 10 system review of systems was obtained and all systems are negative except as noted in the HPI and PMH.      Allergies  Review of patient's allergies indicates no known allergies.  Home Medications   Current Outpatient Rx  Name  Route  Sig  Dispense  Refill  . acyclovir (ZOVIRAX) 200 MG capsule   Oral   Take 200 mg by mouth daily as needed (takes once daily as needed for flareups).         . B Complex-Biotin-FA (B COMPLEX 100 TR) TBCR   Oral   Take 1 tablet by mouth every morning.         . celecoxib (CELEBREX) 200 MG capsule   Oral   Take 200 mg by mouth  at bedtime.         Marland Kitchen lisinopril-hydrochlorothiazide (PRINZIDE,ZESTORETIC) 20-25 MG per tablet   Oral   Take 1 tablet by mouth every evening.   90 tablet   0     Must be seen before next refill   . Olopatadine HCl (PATADAY) 0.2 % SOLN   Right Eye   Place 1 drop into the right eye daily as needed (for wetness).         Marland Kitchen OVER THE COUNTER MEDICATION   Oral   Take 1 each by mouth daily. "it works" supplement         . pravastatin (PRAVACHOL) 40 MG tablet   Oral   Take 40 mg by mouth every morning.          BP 140/94  Pulse 67  Temp(Src) 98.2 F (36.8 C) (Oral)  Resp 16  SpO2 99% Physical Exam  Constitutional: She is oriented to person, place, and time. She appears well-developed and well-nourished. No distress.  HENT:  Head: Normocephalic and  atraumatic.  Mouth/Throat: Oropharynx is clear and moist. No oropharyngeal exudate.  Eyes: Conjunctivae and EOM are normal. Pupils are equal, round, and reactive to light.  Neck: Normal range of motion. Neck supple.  Cardiovascular: Normal rate, regular rhythm and normal heart sounds.   Pulmonary/Chest: Effort normal and breath sounds normal. No respiratory distress. She exhibits no tenderness.  Abdominal: Soft. There is no tenderness. There is no rebound and no guarding.  Musculoskeletal: Normal range of motion. She exhibits no edema and no tenderness.  Neurological: She is alert and oriented to person, place, and time. No cranial nerve deficit. She exhibits normal muscle tone. Coordination normal.  Skin: Skin is warm.    ED Course  Procedures (including critical care time) Labs Review Labs Reviewed  CBC WITH DIFFERENTIAL - Abnormal; Notable for the following:    WBC 12.5 (*)    HCT 35.9 (*)    Neutro Abs 8.9 (*)    All other components within normal limits  COMPREHENSIVE METABOLIC PANEL - Abnormal; Notable for the following:    GFR calc non Af Amer 73 (*)    GFR calc Af Amer 85 (*)    All other components within normal limits  TROPONIN I   Imaging Review Dg Chest 2 View  03/12/2014   CLINICAL DATA:  Chest pain  EXAM: CHEST  2 VIEW  COMPARISON:  10/16/2013  FINDINGS: Lungs are clear. No pleural effusion or pneumothorax.  The heart is normal in size.  Mild degenerative changes of the visualized thoracolumbar spine disease.  IMPRESSION: No evidence of acute cardiopulmonary disease.   Electronically Signed   By: Julian Hy M.D.   On: 03/12/2014 17:30     EKG Interpretation   Date/Time:  Tuesday March 12 2014 15:51:23 EDT Ventricular Rate:  76 PR Interval:  172 QRS Duration: 81 QT Interval:  468 QTC Calculation: 526 R Axis:   16 Text Interpretation:  Sinus rhythm Low voltage, precordial leads  Borderline T abnormalities, diffuse leads Prolonged QT interval Baseline   wander in lead(s) V2 No significant change was found Confirmed by Wyvonnia Dusky   MD, Torion Hulgan [phone]) on 03/12/2014 3:59:45 PM      MDM   Final diagnoses:  Chest pain  Abnormal ECG   episode of chest pain has been ongoing for the past several days radiating to her arm and her back. No chest pain currently.  Her symptoms are concerning for angina but she has some atypical  features as well. Her EKG is unchanged. Troponin is negative.  CXR negative.  Patient's last stress test in 2002 showed area of reversible ischemia that was not thought to be significant.   Patient has been seen by Dr. Haroldine Laws of cardiology. Inpatient stress testing versus catheterization was offered but patient prefers to go home. Cardiology will arrange outpatient followup. She'll return with worsening symptoms. Will start aspirin and PPI.  Ezequiel Essex, MD 03/12/14 [phone]

## 2014-03-12 NOTE — Progress Notes (Signed)
   Subjective:    Patient ID: Bethany [name], female    DOB: November 04, 1951, 63 y.o.   MRN: [id number]  HPI This 63 y.o. female presents for evaluation of has been having substernal chest pain which Radiates up into the bilateral neck.  She has had 3 episodes over the past 2 weeks. She states she has mild substernal chest discomfort at present.   Review of Systems C/o chest pain No  SOB, HA, dizziness, vision change, N/V, diarrhea, constipation, dysuria, urinary urgency or frequency, myalgias, arthralgias or rash.     Objective:   Physical Exam  Vital signs noted  Well developed well nourished obese AA female.  HEENT - Head atraumatic Normocephalic                Eyes - PERRLA, Conjuctiva - clear Sclera- Clear EOMI                Ears - EAC's Wnl TM's Wnl Gross Hearing WNL                Throat - oropharanx wnl Respiratory - Lungs CTA bilateral Cardiac - RRR S1 and S2 without murmur GI - Abdomen soft Nontender and bowel sounds active x 4  Ekg - NSR with flipped ST segments in anterior lateral leads     Assessment & Plan:  Chest pain - Plan: EKG 12-Lead Oxygen 2 liters [place], ASA 81mg  4 po now Call 911 and transport to ED.  Lysbeth Penner FNP

## 2014-03-12 NOTE — H&P (Addendum)
Cardiologist: New PCP: Yaakov Guthrie, MD  HPI:  63 y/o woman with obesity, HTN, HL presents to ER with CP.   Had stress test many years ago which was normal. Has never had a cath. Was at church Sunday and ate a hot dog. Shortly thereafter developed severe central CP which lasted for several hours. Thought it was indigestion but didn't improve with antacids. Pain resolved spontaneously. Pain not worse with exertion. Yesterday had a very slight episode after eating. No exertional sx. Today went to see her PCP and told him about sx. ECG done which showed chronic TW abnormalities. She was sent to ER. Now pain free. Troponin normal.   Denies melena, BRBPR, N/V or known GB problems. Hard to walk due to OA and R TKA in 10/14  ECG showed NSR 76 with diffuse nonspecific TWI no change from previous in 10/16/13.   Review of Systems:     Cardiac Review of Systems: {Y] = yes [ ]  = no  Chest Pain [  y  ]  Resting SOB [   ] Exertional SOB  [  ]  Orthopnea [  ]   Pedal Edema [   ]    Palpitations [  ] Syncope  [  ]   Presyncope [   ]  General Review of Systems: [Y] = yes [  ]=no Constitional: recent weight change [  ]; anorexia [  ]; fatigue [  ]; nausea [  ]; night sweats [  ]; fever [  ]; or chills [  ];                                                                                                                                          Dental: poor dentition[  ];   Eye : blurred vision [  ]; diplopia [   ]; vision changes [  ];  Amaurosis fugax[  ]; Resp: cough [  ];  wheezing[  ];  hemoptysis[  ]; shortness of breath[  ]; paroxysmal nocturnal dyspnea[  ]; dyspnea on exertion[  ]; or orthopnea[  ];  GI:  gallstones[  ], vomiting[  ];  dysphagia[  ]; melena[  ];  hematochezia [  ]; heartburn[  ];   Hx of  Colonoscopy[  ]; GU: kidney stones [  ]; hematuria[  ];   dysuria [  ];  nocturia[  ];  history of     obstruction [  ];                 Skin: rash, swelling[  ];, hair loss[  ];  peripheral  edema[  ];  or itching[  ]; Musculosketetal: myalgias[  ];  joint swelling[y  ];  joint erythema[  ];  joint pain[y  ];  back pain[  ];  Heme/Lymph: bruising[  ];  bleeding[  ];  anemia[  ];  Neuro: TIA[  ];  headaches[  ];  stroke[  ];  vertigo[  ];  seizures[  ];   paresthesias[  ];  difficulty walking[  ];  Psych:depression[  ]; anxiety[  ];  Endocrine: diabetes[  ];  thyroid dysfunction[  ];  Other:  Past Medical History  Diagnosis Date  . Hypertension   . Hyperlipidemia   . Allergy 1997    knees  . Obesity   . GERD (gastroesophageal reflux disease)   . Seasonal allergies   . Pneumonia     hx of 10 years ago  . Headache(784.0)   . Arthritis    No current facility-administered medications on file prior to encounter.   Current Outpatient Prescriptions on File Prior to Encounter  Medication Sig Dispense Refill  . lisinopril-hydrochlorothiazide (PRINZIDE,ZESTORETIC) 20-25 MG per tablet Take 1 tablet by mouth every evening.  90 tablet  0  . pravastatin (PRAVACHOL) 40 MG tablet Take 40 mg by mouth every morning.          (Not in a hospital admission)   No Known Allergies  History   Social History  . Marital Status: Married    Spouse Name: N/A    Number of Children: N/A  . Years of Education: N/A   Occupational History  . Not on file.   Social History Main Topics  . Smoking status: Never Smoker   . Smokeless tobacco: Never Used  . Alcohol Use: No  . Drug Use: No  . Sexual Activity: Not Currently     Comment: husband not able to have  sex   Other Topics Concern  . Not on file   Social History Narrative  . No narrative on file    Family History  Problem Relation Age of Onset  . Heart disease Mother   . Diabetes Sister   . Arthritis Brother     knees  . Heart disease Sister     tumor in the heart    PHYSICAL EXAM: Filed Vitals:   03/12/14 1645  BP:   Pulse: 75  Temp:   Resp: 17  BP 140/90 General:  Well appearing. No respiratory  difficulty HEENT: normal Neck: supple. no JVD. Carotids 2+ bilat; no bruits. No lymphadenopathy or thryomegaly appreciated. Cor: PMI nondisplaced. Regular rate & rhythm. No rubs, gallops or murmurs. Lungs: clear Abdomen: obese, soft, nontender, nondistended. No hepatosplenomegaly. No bruits or masses. Good bowel sounds. Extremities: no cyanosis, clubbing, rash, edema Neuro: alert & oriented x 3, cranial nerves grossly intact. moves all 4 extremities w/o difficulty. Affect pleasant.  ECG: ECG showed NSR 76 with diffuse nonspecific TWI no change from previous in 10/16/13.   Results for orders placed during the hospital encounter of 03/12/14 (from the past 24 hour(s))  CBC WITH DIFFERENTIAL     Status: Abnormal   Collection Time    03/12/14  4:09 PM      Result Value Ref Range   WBC 12.5 (*) 4.0 - 10.5 K/uL   RBC 4.32  3.87 - 5.11 MIL/uL   Hemoglobin 12.4  12.0 - 15.0 g/dL   HCT 35.9 (*) 36.0 - 46.0 %   MCV 83.1  78.0 - 100.0 fL   MCH 28.7  26.0 - 34.0 pg   MCHC 34.5  30.0 - 36.0 g/dL   RDW 14.4  11.5 - 15.5 %   Platelets 277  150 - 400 K/uL   Neutrophils Relative % 72  43 - 77 %   Neutro Abs 8.9 (*)  1.7 - 7.7 K/uL   Lymphocytes Relative 21  12 - 46 %   Lymphs Abs 2.6  0.7 - 4.0 K/uL   Monocytes Relative 7  3 - 12 %   Monocytes Absolute 0.9  0.1 - 1.0 K/uL   Eosinophils Relative 1  0 - 5 %   Eosinophils Absolute 0.1  0.0 - 0.7 K/uL   Basophils Relative 0  0 - 1 %   Basophils Absolute 0.0  0.0 - 0.1 K/uL  COMPREHENSIVE METABOLIC PANEL     Status: Abnormal   Collection Time    03/12/14  4:09 PM      Result Value Ref Range   Sodium 142  137 - 147 mEq/L   Potassium 4.1  3.7 - 5.3 mEq/L   Chloride 102  96 - 112 mEq/L   CO2 27  19 - 32 mEq/L   Glucose, Bld 97  70 - 99 mg/dL   BUN 10  6 - 23 mg/dL   Creatinine, Ser 0.83  0.50 - 1.10 mg/dL   Calcium 9.9  8.4 - 10.5 mg/dL   Total Protein 7.3  6.0 - 8.3 g/dL   Albumin 3.7  3.5 - 5.2 g/dL   AST 22  0 - 37 U/L   ALT 11  0 - 35 U/L    Alkaline Phosphatase 89  39 - 117 U/L   Total Bilirubin 0.8  0.3 - 1.2 mg/dL   GFR calc non Af Amer 73 (*) >90 mL/min   GFR calc Af Amer 85 (*) >90 mL/min  TROPONIN I     Status: None   Collection Time    03/12/14  4:09 PM      Result Value Ref Range   Troponin I <0.30  <0.30 ng/mL   Dg Chest 2 View  03/12/2014   CLINICAL DATA:  Chest pain  EXAM: CHEST  2 VIEW  COMPARISON:  10/16/2013  FINDINGS: Lungs are clear. No pleural effusion or pneumothorax.  The heart is normal in size.  Mild degenerative changes of the visualized thoracolumbar spine disease.  IMPRESSION: No evidence of acute cardiopulmonary disease.   Electronically Signed   By: Julian Hy M.D.   On: 03/12/2014 17:30    ASSESSMENT: 1. CP 2. HTN 3. HL 4. Morbid obesity 5. Abnormal ECG  PLAN/DISCUSSION:  CP with typical and atypical features. No objective evidence ischemia despite prolonged symptoms. I discussed cath vs stress testing with her and her daughters. She prefers to go home and have outpatient stress test. I feel this is safe approach. Knows to return to ER for recurrent sx. Will start ASA 81 and PPI. Left message on office line to contact patient for Lexiscan Myoview this week.   Deshea Pooley,MD 6:20 PM

## 2014-03-12 NOTE — ED Notes (Addendum)
Pt to ED via EMS with c/o epigastric/midsternal chest pain, described as "twenching feeling," onset Sunday. Pt reports at arrival intermittent chest pain, rate pain 1/10, states chest pain increases with activity. Per EMS, EKG unremarkable, BP-168/104,170/103,143/90, HR-72 sinus, O2-99% on 2L [place]. EMS given 325mg  ASA and sublingual nitro x1.

## 2014-03-12 NOTE — Discharge Instructions (Signed)
Chest Pain (Nonspecific) Followup with the cardiologist for stress test. Return to the ED if you develop worsening chest pain, shortness of breath or any other symptoms. It is often hard to give a specific diagnosis for the cause of chest pain. There is always a chance that your pain could be related to something serious, such as a heart attack or a blood clot in the lungs. You need to follow up with your caregiver for further evaluation. CAUSES   Heartburn.  Pneumonia or bronchitis.  Anxiety or stress.  Inflammation around your heart (pericarditis) or lung (pleuritis or pleurisy).  A blood clot in the lung.  A collapsed lung (pneumothorax). It can develop suddenly on its own (spontaneous pneumothorax) or from injury (trauma) to the chest.  Shingles infection (herpes zoster virus). The chest wall is composed of bones, muscles, and cartilage. Any of these can be the source of the pain.  The bones can be bruised by injury.  The muscles or cartilage can be strained by coughing or overwork.  The cartilage can be affected by inflammation and become sore (costochondritis). DIAGNOSIS  Lab tests or other studies, such as X-rays, electrocardiography, stress testing, or cardiac imaging, may be needed to find the cause of your pain.  TREATMENT   Treatment depends on what may be causing your chest pain. Treatment may include:  Acid blockers for heartburn.  Anti-inflammatory medicine.  Pain medicine for inflammatory conditions.  Antibiotics if an infection is present.  You may be advised to change lifestyle habits. This includes stopping smoking and avoiding alcohol, caffeine, and chocolate.  You may be advised to keep your head raised (elevated) when sleeping. This reduces the chance of acid going backward from your stomach into your esophagus.  Most of the time, nonspecific chest pain will improve within 2 to 3 days with rest and mild pain medicine. HOME CARE INSTRUCTIONS   If  antibiotics were prescribed, take your antibiotics as directed. Finish them even if you start to feel better.  For the next few days, avoid physical activities that bring on chest pain. Continue physical activities as directed.  Do not smoke.  Avoid drinking alcohol.  Only take over-the-counter or prescription medicine for pain, discomfort, or fever as directed by your caregiver.  Follow your caregiver's suggestions for further testing if your chest pain does not go away.  Keep any follow-up appointments you made. If you do not go to an appointment, you could develop lasting (chronic) problems with pain. If there is any problem keeping an appointment, you must call to reschedule. SEEK MEDICAL CARE IF:   You think you are having problems from the medicine you are taking. Read your medicine instructions carefully.  Your chest pain does not go away, even after treatment.  You develop a rash with blisters on your chest. SEEK IMMEDIATE MEDICAL CARE IF:   You have increased chest pain or pain that spreads to your arm, neck, jaw, back, or abdomen.  You develop shortness of breath, an increasing cough, or you are coughing up blood.  You have severe back or abdominal pain, feel nauseous, or vomit.  You develop severe weakness, fainting, or chills.  You have a fever. THIS IS AN EMERGENCY. Do not wait to see if the pain will go away. Get medical help at once. Call your local emergency services (911 in [place] Do not drive yourself to the hospital. MAKE SURE YOU:   Understand these instructions.  Will watch your condition.  Will get help right  away if you are not doing well or get worse. Document Released: 09/22/2005 Document Revised: 03/06/2012 Document Reviewed: 07/18/2008 [facility] Patient Information 2014 [place].

## 2014-03-25 ENCOUNTER — Encounter: Payer: Self-pay | Admitting: Pulmonary Disease

## 2014-03-25 ENCOUNTER — Ambulatory Visit (INDEPENDENT_AMBULATORY_CARE_PROVIDER_SITE_OTHER): Payer: Managed Care, Other (non HMO) | Admitting: Pulmonary Disease

## 2014-03-25 VITALS — BP 128/80 | HR 80 | Temp 98.5°F | Ht 65.0 in | Wt 237.4 lb

## 2014-03-25 DIAGNOSIS — G4733 Obstructive sleep apnea (adult) (pediatric): Secondary | ICD-10-CM

## 2014-03-25 NOTE — Patient Instructions (Signed)
Will schedule for home sleep testing, and will arrange followup once the results are available.  Work on weight loss  

## 2014-03-25 NOTE — Assessment & Plan Note (Signed)
The patient's history is very suggestive of clinically significant sleep apnea. I have had a long discussion with her about the pathophysiology of sleep disordered breathing, including its impact to her quality of life and cardiovascular health. I think she needs to have a sleep study for diagnosis, and she is an excellent candidate for home sleep testing. The patient is agreeable to this approach.

## 2014-03-25 NOTE — Progress Notes (Signed)
Subjective:    Patient ID: Bethany [name], female    DOB: 17-Oct-1951, 63 y.o.   MRN: [id number]  HPI The patient is a 63 year old female who I've been asked to see for possible obstructive sleep apnea. She was recently in the hospital for surgery, and was noted to have episodes of bradycardia during sleep. She has been noted to have very loud snoring, and also describes choking arousals during sleep. She has frequent awakenings at night, and complains of early morning headaches. She is not rested in the mornings upon arising. She notes definite sleep pressure during the day with inactivity, and will doze in the evenings watching television. She also gets sleepy driving longer distances. The patient states that her weight is stable over the last 2 years, and her Epworth score today is 10.   Sleep Questionnaire What time do you typically go to bed?( Between what hours) 11pm 11pm at 1543 on 03/25/14 by Virl Cagey, CMA How long does it take you to fall asleep? 5hr 5hr at 1543 on 03/25/14 by Virl Cagey, CMA How many times during the night do you wake up? 4 4 at 1543 on 03/25/14 by Virl Cagey, CMA What time do you get out of bed to start your day? 0700 0700 at 1543 on 03/25/14 by Virl Cagey, CMA Do you drive or operate heavy machinery in your occupation? No No at 1543 on 03/25/14 by Virl Cagey, CMA How much has your weight changed (up or down) over the past two years? (In pounds) 0 oz (0 kg) 0 oz (0 kg) at 1543 on 03/25/14 by Virl Cagey, CMA Have you ever had a sleep study before? No No at 1543 on 03/25/14 by Virl Cagey, CMA Do you currently use CPAP? No No at 1543 on 03/25/14 by Virl Cagey, CMA Do you wear oxygen at any time? No No at 1543 on 03/25/14 by Virl Cagey, CMA   Review of Systems  Constitutional: Negative for fever and unexpected weight change.  HENT: Positive for ear pain. Negative for congestion, dental problem, nosebleeds,  postnasal drip, rhinorrhea, sinus pressure, sneezing, sore throat and trouble swallowing.        Allergies  Eyes: Negative for redness and itching.  Respiratory: Positive for shortness of breath. Negative for cough, chest tightness and wheezing.   Cardiovascular: Positive for chest pain. Negative for palpitations and leg swelling.  Gastrointestinal: Negative for nausea and vomiting.  Genitourinary: Negative for dysuria.  Musculoskeletal: Positive for arthralgias. Negative for joint swelling.  Skin: Negative for rash.  Neurological: Positive for headaches.  Hematological: Does not bruise/bleed easily.  Psychiatric/Behavioral: Negative for dysphoric mood. The patient is not nervous/anxious.        Objective:   Physical Exam Constitutional: obese female, no acute distress  HENT:  Nares patent without discharge, enlarged turbinates.  Oropharynx without exudate, palate and uvula are moderately elongated.  Eyes:  Perrla, eomi, no scleral icterus  Neck:  No JVD, no TMG  Cardiovascular:  Normal rate, regular rhythm, no rubs or gallops.  No murmurs        Intact distal pulses  Pulmonary :  Normal breath sounds, no stridor or respiratory distress   No rales, rhonchi, or wheezing  Abdominal:  Soft, nondistended, bowel sounds present.  No tenderness noted.   Musculoskeletal:  mild lower extremity edema noted.  Lymph Nodes:  No cervical lymphadenopathy noted  Skin:  No cyanosis noted  Neurologic:  Alert, appropriate,  moves all 4 extremities without obvious deficit.         Assessment & Plan:

## 2014-04-01 ENCOUNTER — Encounter: Payer: Self-pay | Admitting: Cardiovascular Disease

## 2014-04-03 ENCOUNTER — Ambulatory Visit (HOSPITAL_COMMUNITY): Payer: Managed Care, Other (non HMO) | Attending: Cardiovascular Disease | Admitting: Radiology

## 2014-04-03 ENCOUNTER — Encounter: Payer: Self-pay | Admitting: Cardiovascular Disease

## 2014-04-03 ENCOUNTER — Encounter (HOSPITAL_COMMUNITY): Payer: Managed Care, Other (non HMO)

## 2014-04-03 ENCOUNTER — Encounter: Payer: Managed Care, Other (non HMO) | Admitting: Cardiovascular Disease

## 2014-04-03 VITALS — BP 135/79 | Ht 65.0 in | Wt 233.0 lb

## 2014-04-03 DIAGNOSIS — E785 Hyperlipidemia, unspecified: Secondary | ICD-10-CM | POA: Insufficient documentation

## 2014-04-03 DIAGNOSIS — I10 Essential (primary) hypertension: Secondary | ICD-10-CM | POA: Insufficient documentation

## 2014-04-03 DIAGNOSIS — R002 Palpitations: Secondary | ICD-10-CM | POA: Insufficient documentation

## 2014-04-03 DIAGNOSIS — R079 Chest pain, unspecified: Secondary | ICD-10-CM | POA: Insufficient documentation

## 2014-04-03 DIAGNOSIS — R9431 Abnormal electrocardiogram [ECG] [EKG]: Secondary | ICD-10-CM

## 2014-04-03 DIAGNOSIS — Z8249 Family history of ischemic heart disease and other diseases of the circulatory system: Secondary | ICD-10-CM | POA: Insufficient documentation

## 2014-04-03 DIAGNOSIS — I4949 Other premature depolarization: Secondary | ICD-10-CM

## 2014-04-03 MED ORDER — TECHNETIUM TC 99M SESTAMIBI GENERIC - CARDIOLITE
30.0000 | Freq: Once | INTRAVENOUS | Status: AC | PRN
  Administered 2014-04-03: 30 via INTRAVENOUS

## 2014-04-03 MED ORDER — REGADENOSON 0.4 MG/5ML IV SOLN
0.4000 mg | Freq: Once | INTRAVENOUS | Status: AC
  Administered 2014-04-03: 0.4 mg via INTRAVENOUS

## 2014-04-03 NOTE — Progress Notes (Signed)
[facility] [address] [place], [place] [address] [phone]    Cardiology Nuclear Med Study  Bethany [name] is a 63 y.o. female     MRN : [id number]     DOB: 06-09-1951  Procedure Date: 04/03/2014  Nuclear Med Background Indication for Stress Test:  Evaluation for Ischemia, and Patient seen in hospital on 03-12-2014 for Chest Pain, enzymes negative History:  MPI: Previous here ?'06  Cardiac Risk Factors: Family History - CAD, Hypertension and Lipids  Symptoms:  Chest Pain and Palpitations   Nuclear Pre-Procedure Caffeine/Decaff Intake:  None > 12 hrs NPO After: 7:30pm   Lungs:  clear O2 Sat: 98% on room air. IV 0.9% NS with Angio Cath:  22g  IV Site: R Antecubital x 1, tolerated well IV Started by:  Irven Baltimore, RN  Chest Size (in):  46 Cup Size: DD  Height: 5\' 5"  (1.651 m)  Weight:  233 lb (105.688 kg)  BMI:  Body mass index is 38.77 kg/(m^2). Tech Comments:  Took Lisinopril this am    Nuclear Med Study 1 or 2 day study: 2 day  Stress Test Type:  Lexiscan  Reading MD: N/A  Order Authorizing Provider:  Jenkins Rouge, MD  Resting Radionuclide: Technetium 68m Sestamibi  Resting Radionuclide Dose: 33.0 mCi on 04/04/14   Stress Radionuclide:  Technetium 56m Sestamibi  Stress Radionuclide Dose: 33.0 mCi on 04/03/14           Stress Protocol Rest HR: 64 Stress HR: 91  Rest BP: 135/79 Stress BP: 142/100  Exercise Time (min): n/a METS: n/a   Predicted Max HR: 156 bpm % Max HR: 58.33 bpm Rate Pressure Product: 12922   Dose of Adenosine (mg):  n/a Dose of Lexiscan: 0.4 mg  Dose of Atropine (mg): n/a Dose of Dobutamine: n/a mcg/kg/min (at max HR)  Stress Test Technologist: Perrin Maltese, EMT-P  Nuclear Technologist:  Charlton Amor, CNMT     Rest Procedure:  Myocardial perfusion imaging was performed at rest 45 minutes following the intravenous administration of Technetium 2m Sestamibi. Rest ECG: NSR PVC nonspecific ST/T wave  changes  Stress Procedure:  The patient received IV Lexiscan 0.4 mg over 15-seconds.  Technetium 97m Sestamibi injected at 30-seconds. This patient had sob, nausea, abdominal pain, headache, and was dizzy with the Lexiscan injection. Quantitative spect images were obtained after a 45 minute delay. Stress ECG: No significant change from baseline ECG  QPS Raw Data Images:  Normal; no motion artifact; normal heart/lung ratio. Stress Images:  Normal homogeneous uptake in all areas of the myocardium. Rest Images:  Normal homogeneous uptake in all areas of the myocardium. Subtraction (SDS):  No evidence of ischemia. Transient Ischemic Dilatation (Normal <1.22):  1.12 Lung/Heart Ratio (Normal <0.45):  0.41  Quantitative Gated Spect Images QGS EDV:  88 ml QGS ESV:  34 ml  Impression Exercise Capacity:  Lexiscan with no exercise. BP Response:  Normal blood pressure response. Clinical Symptoms:  There is dyspnea. ECG Impression:  No significant ST segment change suggestive of ischemia. Comparison with Prior Nuclear Study: No images to compare  Overall Impression:  Normal stress nuclear study.  LV Ejection Fraction: 61%.  LV Wall Motion:  NL LV Function; NL Wall Motion   [name]

## 2014-04-04 ENCOUNTER — Ambulatory Visit (HOSPITAL_COMMUNITY): Payer: Managed Care, Other (non HMO) | Attending: Cardiology

## 2014-04-04 DIAGNOSIS — R0989 Other specified symptoms and signs involving the circulatory and respiratory systems: Secondary | ICD-10-CM

## 2014-04-04 MED ORDER — TECHNETIUM TC 99M SESTAMIBI GENERIC - CARDIOLITE
33.0000 | Freq: Once | INTRAVENOUS | Status: AC | PRN
  Administered 2014-04-04: 33 via INTRAVENOUS

## 2014-04-18 ENCOUNTER — Encounter: Payer: Self-pay | Admitting: Cardiovascular Disease

## 2014-04-18 ENCOUNTER — Ambulatory Visit (INDEPENDENT_AMBULATORY_CARE_PROVIDER_SITE_OTHER): Payer: Managed Care, Other (non HMO) | Admitting: Cardiovascular Disease

## 2014-04-18 VITALS — BP 140/98 | HR 72 | Ht 65.0 in | Wt 236.0 lb

## 2014-04-18 DIAGNOSIS — R079 Chest pain, unspecified: Secondary | ICD-10-CM

## 2014-04-18 DIAGNOSIS — I10 Essential (primary) hypertension: Secondary | ICD-10-CM

## 2014-04-18 DIAGNOSIS — R9431 Abnormal electrocardiogram [ECG] [EKG]: Secondary | ICD-10-CM

## 2014-04-18 MED ORDER — LOSARTAN POTASSIUM-HCTZ 100-25 MG PO TABS: 1.0000 | ORAL_TABLET | Freq: Every day | ORAL | Status: DC

## 2014-04-18 NOTE — Assessment & Plan Note (Signed)
Change to hyzaar 100/25  F/U with primary Low sodium diet

## 2014-04-18 NOTE — Assessment & Plan Note (Signed)
Atypical with normal f/u myovue  Likely GI in nature Continue prilosec

## 2014-04-18 NOTE — Progress Notes (Signed)
Patient ID: Bethany [name], female   DOB: [birth year], 63 y.o.   MRN: [id number]  63 yo new to me.  Seen in ER 3/17 for chest pain.  Had nonspecific ECG changes.  R/O CXR NAD  Pain relieved by GI meds not nitro.  Seen by Dr Jeffie Pollock And told to have outpatient myovue Reviiewed this done 04/05/14 normal with EF 61%.  Pain initially ppt by [place] [employer].  No recurrence since d/c now on prilosec CRF;s HTN and elevated lipids on Rx  BP seems suboptimally controlled.  Salt in diet.  Sedentary. No dyspnea, palpitations or syncope.  Takes BP med at night No excess ETOH  Pain initially epigastric with radiation to jaw.      ROS: Denies fever, malais, weight loss, blurry vision, decreased visual acuity, cough, sputum, SOB, hemoptysis, pleuritic pain, palpitaitons, heartburn, abdominal pain, melena, lower extremity edema, claudication, or rash.  All other systems reviewed and negative   General: Affect appropriate Obese black female  HEENT: normal Neck supple with no adenopathy JVP normal no bruits no thyromegaly Lungs clear with no wheezing and good diaphragmatic motion Heart:  S1/S2 no murmur,rub, gallop or click PMI normal Abdomen: benighn, BS positve, no tenderness, no AAA no bruit.  No HSM or HJR Distal pulses intact with no bruits No edema Neuro non-focal Skin warm and dry No muscular weakness  Medications Current Outpatient Prescriptions  Medication Sig Dispense Refill  . acyclovir (ZOVIRAX) 200 MG capsule Take 200 mg by mouth daily as needed (takes once daily as needed for flareups).      Marland Kitchen aspirin 81 MG chewable tablet Chew 1 tablet (81 mg total) by mouth daily.  30 tablet  0  . B Complex-Biotin-FA (B COMPLEX 100 TR) TBCR Take 1 tablet by mouth every morning.      . celecoxib (CELEBREX) 200 MG capsule Take 200 mg by mouth at bedtime.      Marland Kitchen lisinopril-hydrochlorothiazide (PRINZIDE,ZESTORETIC) 20-25 MG per tablet Take 1 tablet by mouth every evening.  90 tablet  0  . Olopatadine HCl  (PATADAY) 0.2 % SOLN Place 1 drop into the right eye daily as needed (for wetness).      Marland Kitchen omeprazole (PRILOSEC) 20 MG capsule Take 1 capsule (20 mg total) by mouth daily.  30 capsule  0  . OVER THE COUNTER MEDICATION Take 1 each by mouth daily. "it works" supplement      . pravastatin (PRAVACHOL) 40 MG tablet Take 40 mg by mouth every morning.       No current facility-administered medications for this visit.    Allergies Review of patient's allergies indicates no known allergies.  Family History: Family History  Problem Relation Age of Onset  . Heart disease Mother   . Diabetes Sister   . Arthritis Brother     knees  . Heart disease Sister     tumor in the heart    Social History: History   Social History  . Marital Status: Married    Spouse Name: N/A    Number of Children: N/A  . Years of Education: N/A   Occupational History  . house wife    Social History Main Topics  . Smoking status: Never Smoker   . Smokeless tobacco: Never Used  . Alcohol Use: No  . Drug Use: No  . Sexual Activity: Not Currently     Comment: husband not able to have  sex   Other Topics Concern  . Not on file   Social History  Narrative  . No narrative on file    Electrocardiogram:  NSR nonspecific ST/T wave changes   Assessment and Plan

## 2014-04-18 NOTE — Patient Instructions (Addendum)
Medication change:   Stop:  Lisinopril/hctz                                       Start:   Losartan/hctz  100/25 mg daily  Follow-up as needed with Dr. Johnsie Cancel

## 2014-04-18 NOTE — Assessment & Plan Note (Signed)
Nonspecific T wave changes likely related to HTN  Yearly ECG  With normal myovue not likely to represent ischemic changes

## 2014-05-06 DIAGNOSIS — G4733 Obstructive sleep apnea (adult) (pediatric): Secondary | ICD-10-CM

## 2014-05-15 ENCOUNTER — Telehealth: Payer: Self-pay | Admitting: Pulmonary Disease

## 2014-05-15 DIAGNOSIS — G4710 Hypersomnia, unspecified: Secondary | ICD-10-CM

## 2014-05-15 DIAGNOSIS — G4730 Sleep apnea, unspecified: Secondary | ICD-10-CM

## 2014-05-15 NOTE — Telephone Encounter (Signed)
Pt needs ov for review of sleep study

## 2014-05-16 ENCOUNTER — Encounter: Payer: Self-pay | Admitting: Pulmonary Disease

## 2014-05-16 NOTE — Telephone Encounter (Signed)
[email or web address].Myalee Stengel, CMA  

## 2014-05-22 NOTE — Telephone Encounter (Signed)
Appt set for 06-05-14. Maddock Bing, CMA

## 2014-06-05 ENCOUNTER — Encounter: Payer: Self-pay | Admitting: Pulmonary Disease

## 2014-06-05 ENCOUNTER — Ambulatory Visit (INDEPENDENT_AMBULATORY_CARE_PROVIDER_SITE_OTHER): Payer: Managed Care, Other (non HMO) | Admitting: Pulmonary Disease

## 2014-06-05 VITALS — BP 122/80 | HR 81 | Temp 98.0°F | Ht 65.0 in | Wt 238.2 lb

## 2014-06-05 DIAGNOSIS — G4733 Obstructive sleep apnea (adult) (pediatric): Secondary | ICD-10-CM

## 2014-06-05 NOTE — Assessment & Plan Note (Signed)
The patient only has mild obstructive sleep apnea by her recent sleep study, but she feels that her sleep is being significantly impacted as well as her quality of life during the day. She is also concerned about her snoring disrupting her husband sleep. I have outlined a conservative course with a trial of weight loss alone, but also more aggressive therapy with a dental appliance or CPAP. After a long discussion, the patient would like to try CPAP while she is losing weight.

## 2014-06-05 NOTE — Progress Notes (Signed)
   Subjective:    Patient ID: Bethany [name], female    DOB: [birth year], 63 y.o.   MRN: [id number]  HPI Patient comes in today for followup after her recent home sleep testing. She was found to have mild OSA, with an AHI of 7 events per hour and oxygen desaturation as low as 80%. I have reviewed the study with her in detail, and answered all of her questions.   Review of Systems  Constitutional: Negative for fever and unexpected weight change.  HENT: Negative for congestion, dental problem, ear pain, nosebleeds, postnasal drip, rhinorrhea, sinus pressure, sneezing, sore throat and trouble swallowing.   Eyes: Negative for redness and itching.  Respiratory: Negative for cough, chest tightness, shortness of breath and wheezing.   Cardiovascular: Negative for palpitations and leg swelling.  Gastrointestinal: Negative for nausea and vomiting.  Genitourinary: Negative for dysuria.  Musculoskeletal: Negative for joint swelling.  Skin: Negative for rash.  Neurological: Negative for headaches.  Hematological: Does not bruise/bleed easily.  Psychiatric/Behavioral: Negative for dysphoric mood. The patient is not nervous/anxious.        Objective:   Physical Exam Obese female in no acute distress Nose without purulence or discharge noted Neck without lymphadenopathy or thyromegaly Lower extremities with mild edema, no cyanosis Alert and oriented, moves all 4 extremities.       Assessment & Plan:

## 2014-06-05 NOTE — Patient Instructions (Signed)
Will start on cpap at a moderate pressure level.  Please call if you are having issues with tolerance. Work on weight reduction.

## 2014-06-19 ENCOUNTER — Other Ambulatory Visit: Payer: Self-pay | Admitting: *Deleted

## 2014-06-19 MED ORDER — OMEPRAZOLE 20 MG PO CPDR: 20.0000 mg | DELAYED_RELEASE_CAPSULE | Freq: Every day | ORAL | Status: DC

## 2014-07-31 ENCOUNTER — Ambulatory Visit (INDEPENDENT_AMBULATORY_CARE_PROVIDER_SITE_OTHER): Payer: Managed Care, Other (non HMO) | Admitting: Pulmonary Disease

## 2014-07-31 ENCOUNTER — Encounter (INDEPENDENT_AMBULATORY_CARE_PROVIDER_SITE_OTHER): Payer: Self-pay

## 2014-07-31 ENCOUNTER — Encounter: Payer: Self-pay | Admitting: Pulmonary Disease

## 2014-07-31 VITALS — BP 120/78 | HR 64 | Temp 98.1°F | Ht 65.0 in | Wt 234.8 lb

## 2014-07-31 DIAGNOSIS — G4733 Obstructive sleep apnea (adult) (pediatric): Secondary | ICD-10-CM

## 2014-07-31 NOTE — Assessment & Plan Note (Signed)
The patient is doing extremely well with CPAP, and has seen a significant improvement in her sleep, resolution of her headaches, and much improved daytime alertness. I have asked her to continue on her CPAP device, and to keep up with mask changes and supplies. I've also encouraged her to work aggressively on weight loss.

## 2014-07-31 NOTE — Patient Instructions (Signed)
Continue with cpap.  You are doing well Work on weight loss followup with me again in 33mos.

## 2014-07-31 NOTE — Progress Notes (Signed)
   Subjective:    Patient ID: Bethany [name], female    DOB: 1951-01-26, 63 y.o.   MRN: [id number]  HPI The patient comes in today for followup of her known obstructive sleep apnea. She is wearing CPAP compliantly by her download, and has excellent control of her AHI. She feels that her mask fits well, and has no significant leak by the download. She has seen improvement in her sleep and daytime alertness. She is also had resolution of her morning headaches.   Review of Systems  Constitutional: Negative for fever and unexpected weight change.  HENT: Negative for congestion, dental problem, ear pain, nosebleeds, postnasal drip, rhinorrhea, sinus pressure, sneezing, sore throat and trouble swallowing.   Eyes: Negative for redness and itching.  Respiratory: Negative for cough, chest tightness, shortness of breath and wheezing.   Cardiovascular: Negative for palpitations and leg swelling.  Gastrointestinal: Negative for nausea and vomiting.  Genitourinary: Negative for dysuria.  Musculoskeletal: Negative for joint swelling.  Skin: Negative for rash.  Neurological: Negative for headaches.  Hematological: Does not bruise/bleed easily.  Psychiatric/Behavioral: Negative for dysphoric mood. The patient is not nervous/anxious.        Objective:   Physical Exam Obese female in no acute distress Nose without purulence or discharge noted Neck without lymphadenopathy or thyromegaly No skin breakdown or pressure necrosis from the CPAP mask Lower extremities with mild edema, no cyanosis Alert and oriented, moves all 4 extremities.       Assessment & Plan:

## 2014-08-06 ENCOUNTER — Encounter: Payer: Self-pay | Admitting: Family Medicine

## 2014-08-06 ENCOUNTER — Ambulatory Visit (INDEPENDENT_AMBULATORY_CARE_PROVIDER_SITE_OTHER): Payer: 59 | Admitting: Family Medicine

## 2014-08-06 VITALS — BP 113/70 | HR 78 | Temp 98.9°F | Ht 65.0 in | Wt 236.0 lb

## 2014-08-06 DIAGNOSIS — J069 Acute upper respiratory infection, unspecified: Secondary | ICD-10-CM

## 2014-08-06 MED ORDER — METHYLPREDNISOLONE ACETATE 80 MG/ML IJ SUSP
80.0000 mg | Freq: Once | INTRAMUSCULAR | Status: AC
  Administered 2014-08-06: 80 mg via INTRAMUSCULAR

## 2014-08-06 MED ORDER — AZITHROMYCIN 250 MG PO TABS: ORAL_TABLET | ORAL | Status: DC

## 2014-08-06 MED ORDER — MECLIZINE HCL 25 MG PO TABS: 25.0000 mg | ORAL_TABLET | Freq: Three times a day (TID) | ORAL | Status: DC | PRN

## 2014-08-06 NOTE — Progress Notes (Signed)
   Subjective:    Patient ID: Soua Lenk, female    DOB: [birth year], 63 y.o.   MRN: [id number]  HPI  This 63 y.o. female presents for evaluation of URI sx's and vertigo.  Review of Systems C/o uri sx's and dizziness   No chest pain, SOB, HA, vision change, N/V, diarrhea, constipation, dysuria, urinary urgency or frequency, myalgias, arthralgias or rash.  Objective:   Physical Exam  Vital signs noted  Well developed well nourished female.  HEENT - Head atraumatic Normocephalic                Eyes - PERRLA, Conjuctiva - clear Sclera- Clear EOMI                Ears - EAC's Wnl TM's Wnl Gross Hearing WNL                Throat - oropharanx wnl Respiratory - Lungs CTA bilateral Cardiac - RRR S1 and S2 without murmur GI - Abdomen soft Nontender and bowel sounds active x 4 Extremities - No edema. Neuro - Grossly intact.      Assessment & Plan:  URI (upper respiratory infection) - Plan: azithromycin (ZITHROMAX) 250 MG tablet, meclizine (ANTIVERT) 25 MG tablet, methylPREDNISolone acetate (DEPO-MEDROL) injection 80 mg  Push po fluids, rest, tylenol and motrin otc prn as directed for fever, arthralgias, and myalgias.  Follow up prn if sx's continue or persist.  Lysbeth Penner FNP

## 2014-09-16 ENCOUNTER — Other Ambulatory Visit: Payer: Self-pay | Admitting: *Deleted

## 2014-09-16 ENCOUNTER — Telehealth: Payer: Self-pay | Admitting: *Deleted

## 2014-09-16 MED ORDER — PRAVASTATIN SODIUM 40 MG PO TABS: 40.0000 mg | ORAL_TABLET | Freq: Every morning | ORAL | Status: DC

## 2014-09-16 NOTE — Telephone Encounter (Signed)
Patient is leaving Saturday for a trip and she is flying. She is still having a little dizziness from the inner ear do you think she will be ok to fly and is there any other recommendations?

## 2014-09-16 NOTE — Telephone Encounter (Signed)
Last lipids 7/14. ntbs.

## 2014-09-16 NOTE — Telephone Encounter (Signed)
Patient advised. Also advised that it may make her drowsy.

## 2014-09-16 NOTE — Telephone Encounter (Signed)
Tell her to take meclizine or antivert and she should be okay

## 2014-10-10 ENCOUNTER — Other Ambulatory Visit: Payer: Self-pay | Admitting: Orthopedic Surgery

## 2014-11-11 ENCOUNTER — Encounter (HOSPITAL_COMMUNITY): Admission: RE | Payer: Self-pay | Source: Ambulatory Visit

## 2014-11-11 ENCOUNTER — Inpatient Hospital Stay (HOSPITAL_COMMUNITY)
Admission: RE | Admit: 2014-11-11 | Payer: Managed Care, Other (non HMO) | Source: Ambulatory Visit | Admitting: Orthopedic Surgery

## 2014-11-11 SURGERY — ARTHROPLASTY, KNEE, TOTAL
Anesthesia: Choice | Site: Knee | Laterality: Left

## 2014-12-13 ENCOUNTER — Other Ambulatory Visit: Payer: Self-pay | Admitting: Family Medicine

## 2015-01-30 ENCOUNTER — Ambulatory Visit: Payer: Managed Care, Other (non HMO) | Admitting: Pulmonary Disease

## 2015-03-04 ENCOUNTER — Ambulatory Visit (INDEPENDENT_AMBULATORY_CARE_PROVIDER_SITE_OTHER): Payer: Medicare Other | Admitting: Pulmonary Disease

## 2015-03-04 ENCOUNTER — Encounter: Payer: Self-pay | Admitting: Pulmonary Disease

## 2015-03-04 VITALS — BP 122/66 | HR 79 | Temp 98.7°F | Ht 65.0 in | Wt 239.4 lb

## 2015-03-04 DIAGNOSIS — G4733 Obstructive sleep apnea (adult) (pediatric): Secondary | ICD-10-CM | POA: Diagnosis not present

## 2015-03-04 NOTE — Progress Notes (Signed)
   Subjective:    Patient ID: Bethany [name], female    DOB: 08-Jul-1951, 64 y.o.   MRN: [id number]  HPI The patient comes in today for follow-up of her obstructive sleep apnea. She is wearing C Pap compliantly, and is having no issues with her mask fit or pressure. Her download shows excellent compliance with no significant mask leak, and she feels that she is sleeping much better with the device. She is having some dryness issues, and was unaware of how to adjust her heated humidifier.   Review of Systems  Constitutional: Negative for fever and unexpected weight change.  HENT: Negative for congestion, dental problem, ear pain, nosebleeds, postnasal drip, rhinorrhea, sinus pressure, sneezing, sore throat and trouble swallowing.   Eyes: Negative for redness and itching.  Respiratory: Positive for cough. Negative for chest tightness, shortness of breath and wheezing.   Cardiovascular: Negative for palpitations and leg swelling.  Gastrointestinal: Negative for nausea and vomiting.  Genitourinary: Negative for dysuria.  Musculoskeletal: Negative for joint swelling.  Skin: Negative for rash.  Neurological: Negative for headaches.  Hematological: Does not bruise/bleed easily.  Psychiatric/Behavioral: Negative for dysphoric mood. The patient is not nervous/anxious.        Objective:   Physical Exam Obese female in no acute distress Nose without purulence or discharge noted No skin breakdown or pressure necrosis from the C Pap mask Neck without lymphadenopathy or thyromegaly Lower extremities with mild edema, no cyanosis Alert and oriented, moves all 4 extremities.        Assessment & Plan:

## 2015-03-04 NOTE — Patient Instructions (Signed)
Continue with C Pap, and keep up with mask changes and supplies Work on weight reduction Follow-up with me again in one year, but call if having issues with your device.

## 2015-03-04 NOTE — Assessment & Plan Note (Signed)
The patient is doing much better with her C-peptide device, and is having no issues with pressure or mask fit. She is having some dryness issues, and I have instructed her on how to make adjustments to the heated humidification system. I have encouraged her to keep up with her supplies, and to work aggressively on weight loss.

## 2015-03-07 ENCOUNTER — Ambulatory Visit (INDEPENDENT_AMBULATORY_CARE_PROVIDER_SITE_OTHER): Payer: Medicare Other | Admitting: Family Medicine

## 2015-03-07 ENCOUNTER — Ambulatory Visit (INDEPENDENT_AMBULATORY_CARE_PROVIDER_SITE_OTHER): Payer: Medicare Other

## 2015-03-07 ENCOUNTER — Encounter: Payer: Self-pay | Admitting: Family Medicine

## 2015-03-07 VITALS — BP 130/83 | HR 83 | Temp 98.3°F | Ht 65.0 in | Wt 242.0 lb

## 2015-03-07 DIAGNOSIS — J189 Pneumonia, unspecified organism: Secondary | ICD-10-CM

## 2015-03-07 DIAGNOSIS — E785 Hyperlipidemia, unspecified: Secondary | ICD-10-CM | POA: Diagnosis not present

## 2015-03-07 DIAGNOSIS — I10 Essential (primary) hypertension: Secondary | ICD-10-CM

## 2015-03-07 DIAGNOSIS — R05 Cough: Secondary | ICD-10-CM

## 2015-03-07 DIAGNOSIS — R059 Cough, unspecified: Secondary | ICD-10-CM

## 2015-03-07 LAB — POCT CBC
Granulocyte percent: 70.1 %G (ref 37–80)
HCT, POC: 39.5 % (ref 37.7–47.9)
HEMOGLOBIN: 12.4 g/dL (ref 12.2–16.2)
Lymph, poc: 3.1 (ref 0.6–3.4)
MCH: 26.6 pg — AB (ref 27–31.2)
MCHC: 31.4 g/dL — AB (ref 31.8–35.4)
MCV: 84.5 fL (ref 80–97)
MPV: 7.3 fL (ref 0–99.8)
PLATELET COUNT, POC: 327 10*3/uL (ref 142–424)
POC GRANULOCYTE: 9.3 — AB (ref 2–6.9)
POC LYMPH %: 23.4 % (ref 10–50)
RBC: 4.67 M/uL (ref 4.04–5.48)
RDW, POC: 13.8 %
WBC: 13.3 10*3/uL — AB (ref 4.6–10.2)

## 2015-03-07 MED ORDER — HYDROCODONE-HOMATROPINE 5-1.5 MG/5ML PO SYRP: 5.0000 mL | ORAL_SOLUTION | Freq: Three times a day (TID) | ORAL | Status: DC | PRN

## 2015-03-07 MED ORDER — ALBUTEROL SULFATE (2.5 MG/3ML) 0.083% IN NEBU
2.5000 mg | INHALATION_SOLUTION | Freq: Once | RESPIRATORY_TRACT | Status: AC
  Administered 2015-03-07: 2.5 mg via RESPIRATORY_TRACT

## 2015-03-07 MED ORDER — LEVOFLOXACIN 500 MG PO TABS: 500.0000 mg | ORAL_TABLET | Freq: Every day | ORAL | Status: DC

## 2015-03-07 MED ORDER — BETAMETHASONE SOD PHOS & ACET 6 (3-3) MG/ML IJ SUSP
6.0000 mg | Freq: Once | INTRAMUSCULAR | Status: AC
  Administered 2015-03-07: 6 mg via INTRAMUSCULAR

## 2015-03-07 NOTE — Patient Instructions (Signed)

## 2015-03-07 NOTE — Progress Notes (Signed)
Subjective:    Patient ID: Bethany [name], female    DOB: 1951/04/06, 64 y.o.   MRN: [id number]  HPI  Pt here for follow up and management of chronic medical problems which include hypertension and hyperlipidemia. She is also complaining of a cough that started about a week ago. Using Mucinex DM, Tessalon. Patient states that she did get short of breath at couple of days ago to the point where she thought she was to seek immediate assistance. She has had fever and shortness of breath intermittently since that time.    follow-up of hypertension. Patient has no history of headache chest pain or shortness of breath or recent cough. Patient also denies symptoms of TIA such as numbness weakness lateralizing. Patient checks  blood pressure at home and has not had any elevated readings recently. Patient denies side effects from his medication. States taking it regularly.   Patient in for follow-up of elevated cholesterol. Doing well without complaints on current medication. Denies side effects of statin including myalgia and arthralgia and nausea. Also in today for liver function testing. Currently no chest pain, shortness of breath or other cardiovascular related symptoms noted.     No Known Allergies  Outpatient Encounter Prescriptions as of 03/07/2015  Medication Sig  . acyclovir (ZOVIRAX) 200 MG capsule Take 200 mg by mouth daily as needed (takes once daily as needed for flareups).  Marland Kitchen aspirin 81 MG chewable tablet Chew 1 tablet (81 mg total) by mouth daily.  . B Complex-Biotin-FA (B COMPLEX 100 TR) TBCR Take 1 tablet by mouth every morning.  . celecoxib (CELEBREX) 200 MG capsule Take 200 mg by mouth at bedtime.  Marland Kitchen losartan-hydrochlorothiazide (HYZAAR) 100-25 MG per tablet Take 1 tablet by mouth daily.  . meclizine (ANTIVERT) 25 MG tablet Take 1 tablet (25 mg total) by mouth 3 (three) times daily as needed for dizziness.  . Olopatadine HCl (PATADAY) 0.2 % SOLN Place 1 drop into the right eye  daily as needed (for wetness).  Marland Kitchen omeprazole (PRILOSEC) 20 MG capsule Take 1 capsule (20 mg total) by mouth daily.  . pravastatin (PRAVACHOL) 40 MG tablet Take 1 tablet (40 mg total) by mouth every morning.    Past Medical History  Diagnosis Date  . Hypertension   . Hyperlipidemia   . Allergy 1997    knees  . Obesity   . GERD (gastroesophageal reflux disease)   . Seasonal allergies   . Pneumonia     hx of 10 years ago  . Headache(784.0)   . Arthritis     Past Surgical History  Procedure Laterality Date  . Abdominal hysterectomy    . Tubal ligation    . Phlebectomy    . Total knee arthroplasty Right 10/22/2013    Procedure: RIGHT TOTAL KNEE ARTHROPLASTY;  Surgeon: Gearlean Alf, MD;  Location: [facility];  Service: Orthopedics;  Laterality: Right;    History   Social History  . Marital Status: Married    Spouse Name: N/A  . Number of Children: N/A  . Years of Education: N/A   Occupational History  . house wife    Social History Main Topics  . Smoking status: Never Smoker   . Smokeless tobacco: Never Used  . Alcohol Use: No  . Drug Use: No  . Sexual Activity: Not Currently     Comment: husband not able to have  sex   Other Topics Concern  . Not on file   Social History Narrative  Review of Systems  Constitutional: Negative for fever, chills, diaphoresis, appetite change, fatigue and unexpected weight change.  HENT: Negative for congestion, ear pain, hearing loss, postnasal drip, rhinorrhea, sneezing, sore throat and trouble swallowing.   Eyes: Negative for pain.  Respiratory: Negative for cough, chest tightness and shortness of breath.   Cardiovascular: Negative for chest pain and palpitations.  Gastrointestinal: Negative for nausea, vomiting, abdominal pain, diarrhea and constipation.  Genitourinary: Negative for dysuria, frequency and menstrual problem.  Musculoskeletal: Negative for joint swelling and arthralgias.  Skin: Negative for rash.    Neurological: Negative for dizziness, weakness, numbness and headaches.  Psychiatric/Behavioral: Negative for dysphoric mood and agitation.       Objective:   Physical Exam  Constitutional: She is oriented to person, place, and time. She appears well-developed and well-nourished. No distress.  HENT:  Head: Normocephalic and atraumatic.  Right Ear: External ear normal.  Left Ear: External ear normal.  Nose: Nose normal.  Mouth/Throat: Oropharynx is clear and moist.  Eyes: Conjunctivae and EOM are normal. Pupils are equal, round, and reactive to light.  Neck: Normal range of motion. Neck supple. No thyromegaly present.  Cardiovascular: Normal rate, regular rhythm and normal heart sounds.   No murmur heard. Pulmonary/Chest: Effort normal. No respiratory distress. She has wheezes. She has rales.  Abdominal: Soft. Bowel sounds are normal. She exhibits no distension. There is no tenderness.  Lymphadenopathy:    She has no cervical adenopathy.  Neurological: She is alert and oriented to person, place, and time. She has normal reflexes.  Skin: Skin is warm and dry.  Psychiatric: She has a normal mood and affect. Her behavior is normal. Judgment and thought content normal.   BP 130/83 mmHg  Pulse 83  Temp(Src) 98.3 F (36.8 C) (Oral)  Ht 5' 5"  (1.651 m)  Wt 242 lb (109.77 kg)  BMI 40.27 kg/m2        Assessment & Plan:   1. Hyperlipidemia   2. Essential hypertension   3. Cough   4. CAP (community acquired pneumonia)     Meds ordered this encounter  Medications  . betamethasone acetate-betamethasone sodium phosphate (CELESTONE) injection 6 mg    Sig:   . albuterol (PROVENTIL) (2.5 MG/3ML) 0.083% nebulizer solution 2.5 mg    Sig:   . levofloxacin (LEVAQUIN) 500 MG tablet    Sig: Take 1 tablet (500 mg total) by mouth daily.    Dispense:  7 tablet    Refill:  0  . HYDROcodone-homatropine (HYCODAN) 5-1.5 MG/5ML syrup    Sig: Take 5 mLs by mouth every 8 (eight) hours as  needed for cough.    Dispense:  120 mL    Refill:  0    Orders Placed This Encounter  Procedures  . DG Chest 2 View    Standing Status: Future     Number of Occurrences: 1     Standing Expiration Date: 05/06/2016    Order Specific Question:  Reason for Exam (SYMPTOM  OR DIAGNOSIS REQUIRED)    Answer:  cough & rhonchi    Order Specific Question:  Preferred imaging location?    Answer:  Internal  . Lipid panel  . CMP14+EGFR  . POCT CBC   Labs pending  Follow up in 3 mos  Claretta Fraise, MD

## 2015-03-08 LAB — LIPID PANEL
Chol/HDL Ratio: 4.2 ratio units (ref 0.0–4.4)
Cholesterol, Total: 173 mg/dL (ref 100–199)
HDL: 41 mg/dL (ref >39)
LDL Calculated: 109 mg/dL — ABNORMAL HIGH (ref 0–99)
TRIGLYCERIDES: 113 mg/dL (ref 0–149)
VLDL Cholesterol Cal: 23 mg/dL (ref 5–40)

## 2015-03-08 LAB — CMP14+EGFR
ALK PHOS: 95 IU/L (ref 39–117)
ALT: 14 IU/L (ref 0–32)
AST: 22 IU/L (ref 0–40)
Albumin/Globulin Ratio: 1.5 (ref 1.1–2.5)
Albumin: 4.1 g/dL (ref 3.6–4.8)
BUN/Creatinine Ratio: 16 (ref 11–26)
BUN: 14 mg/dL (ref 8–27)
Bilirubin Total: 0.6 mg/dL (ref 0.0–1.2)
CO2: 26 mmol/L (ref 18–29)
CREATININE: 0.87 mg/dL (ref 0.57–1.00)
Calcium: 9.9 mg/dL (ref 8.7–10.3)
Chloride: 102 mmol/L (ref 97–108)
GFR calc Af Amer: 81 mL/min/{1.73_m2} (ref >59)
GFR calc non Af Amer: 71 mL/min/{1.73_m2} (ref >59)
Globulin, Total: 2.8 g/dL (ref 1.5–4.5)
Glucose: 117 mg/dL — ABNORMAL HIGH (ref 65–99)
POTASSIUM: 4 mmol/L (ref 3.5–5.2)
Sodium: 143 mmol/L (ref 134–144)
Total Protein: 6.9 g/dL (ref 6.0–8.5)

## 2015-03-10 ENCOUNTER — Encounter: Payer: Self-pay | Admitting: Family Medicine

## 2015-03-13 ENCOUNTER — Telehealth: Payer: Self-pay | Admitting: Family Medicine

## 2015-03-14 MED ORDER — LEVOFLOXACIN 500 MG PO TABS: 500.0000 mg | ORAL_TABLET | Freq: Every day | ORAL | Status: DC

## 2015-03-14 NOTE — Telephone Encounter (Signed)
Patient aware rx sent to pharmacy.  

## 2015-03-14 NOTE — Telephone Encounter (Signed)
Levaquin scrip sent

## 2015-03-17 ENCOUNTER — Telehealth: Payer: Self-pay | Admitting: Family Medicine

## 2015-03-17 MED ORDER — HYDROCODONE-HOMATROPINE 5-1.5 MG/5ML PO SYRP: 5.0000 mL | ORAL_SOLUTION | Freq: Three times a day (TID) | ORAL | Status: DC | PRN

## 2015-03-17 NOTE — Telephone Encounter (Signed)
Pt notified RX is ready for pick up 

## 2015-03-17 NOTE — Telephone Encounter (Signed)
Prescription printed. Patient will need to pick that up thanks, WS.

## 2015-03-28 ENCOUNTER — Other Ambulatory Visit: Payer: Self-pay | Admitting: *Deleted

## 2015-03-28 DIAGNOSIS — J069 Acute upper respiratory infection, unspecified: Secondary | ICD-10-CM

## 2015-03-28 MED ORDER — OMEPRAZOLE 20 MG PO CPDR: 20.0000 mg | DELAYED_RELEASE_CAPSULE | Freq: Every day | ORAL | Status: DC

## 2015-03-28 MED ORDER — LOSARTAN POTASSIUM-HCTZ 100-25 MG PO TABS: 1.0000 | ORAL_TABLET | Freq: Every day | ORAL | Status: DC

## 2015-03-28 MED ORDER — PRAVASTATIN SODIUM 40 MG PO TABS: 40.0000 mg | ORAL_TABLET | Freq: Every morning | ORAL | Status: DC

## 2015-03-28 MED ORDER — ACYCLOVIR 200 MG PO CAPS: 200.0000 mg | ORAL_CAPSULE | Freq: Every day | ORAL | Status: DC | PRN

## 2015-03-28 MED ORDER — MECLIZINE HCL 25 MG PO TABS
25.0000 mg | ORAL_TABLET | Freq: Three times a day (TID) | ORAL | Status: DC | PRN
Start: 2015-03-28 — End: 2015-03-31

## 2015-03-31 ENCOUNTER — Other Ambulatory Visit: Payer: Self-pay

## 2015-03-31 DIAGNOSIS — J069 Acute upper respiratory infection, unspecified: Secondary | ICD-10-CM

## 2015-03-31 MED ORDER — PRAVASTATIN SODIUM 40 MG PO TABS
40.0000 mg | ORAL_TABLET | Freq: Every morning | ORAL | Status: DC
Start: 2015-03-31 — End: 2015-09-02

## 2015-03-31 MED ORDER — MECLIZINE HCL 25 MG PO TABS: 25.0000 mg | ORAL_TABLET | Freq: Three times a day (TID) | ORAL | Status: DC | PRN

## 2015-04-17 DIAGNOSIS — H16142 Punctate keratitis, left eye: Secondary | ICD-10-CM | POA: Diagnosis not present

## 2015-04-17 DIAGNOSIS — H04121 Dry eye syndrome of right lacrimal gland: Secondary | ICD-10-CM | POA: Diagnosis not present

## 2015-04-17 DIAGNOSIS — H11422 Conjunctival edema, left eye: Secondary | ICD-10-CM | POA: Diagnosis not present

## 2015-04-17 DIAGNOSIS — H2513 Age-related nuclear cataract, bilateral: Secondary | ICD-10-CM | POA: Diagnosis not present

## 2015-04-22 ENCOUNTER — Other Ambulatory Visit: Payer: Self-pay | Admitting: Family Medicine

## 2015-04-22 DIAGNOSIS — H04123 Dry eye syndrome of bilateral lacrimal glands: Secondary | ICD-10-CM | POA: Diagnosis not present

## 2015-04-22 DIAGNOSIS — H11422 Conjunctival edema, left eye: Secondary | ICD-10-CM | POA: Diagnosis not present

## 2015-04-22 DIAGNOSIS — H16142 Punctate keratitis, left eye: Secondary | ICD-10-CM | POA: Diagnosis not present

## 2015-04-22 DIAGNOSIS — H2513 Age-related nuclear cataract, bilateral: Secondary | ICD-10-CM | POA: Diagnosis not present

## 2015-04-23 MED ORDER — ACYCLOVIR 200 MG PO CAPS: 200.0000 mg | ORAL_CAPSULE | Freq: Every day | ORAL | Status: DC | PRN

## 2015-04-23 NOTE — Telephone Encounter (Signed)
done

## 2015-04-29 ENCOUNTER — Ambulatory Visit: Payer: Self-pay | Admitting: Orthopedic Surgery

## 2015-04-29 NOTE — H&P (Signed)
Bethany [name] DOB: [birth year] Married / Language: English / Race: Black or African American Female Date of Admission:  05/19/2015 CC: Left knee pain History of Present Illness The patient is a 64 year old female who comes in for a preoperative History and Physical. The patient is scheduled for a left total knee arthroplasty to be performed by Dr. Dione Plover. Aluisio, MD at [facility] on 05-19-2015. The patient is a 64 year old female and comes in over a year out from right total knee arthroplasty. The patient states that she is doing well at this time. control at this time and describe their pain as mild (having more issues with the "tendons" in the knee). They are currently on no medication for their pain. The patient is currently doing home exercise program. The patient feels that they are progressing well at this time. She is really pleased with how she is doing with her right knee. She said she has minimal discomfort. She is getting around and doing a lot more. The only thing holding her back is her left knee. She has documented bone on bone arthritis, medial and patellofemoral left knee. She has had injections without benefit. She is ready to go ahead and proceed with the left knee replacement as she is very happy and very satisfied with her right knee. They have been treated conservatively in the past for the above stated problem and despite conservative measures, they continue to have progressive pain and severe functional limitations and dysfunction. They have failed non-operative management including home exercise, medications, and injections. It is felt that they would benefit from undergoing total joint replacement. Risks and benefits of the procedure have been discussed with the patient and they elect to proceed with surgery. There are no active contraindications to surgery such as ongoing infection or rapidly progressive neurological disease.  Problem List/Past  Medical Menopause Hypertension Hypercholesterolemia Varicose veins Primary osteoarthritis of knee, right Status post total knee replacement, right Vertigo Tinnitus Cataract Migraine Headache Pneumonia Past Recent History Sleep Apnea uses CPAP Hiatal Hernia  Allergies No Known Drug Allergies  Family History  Father Deceased. age 9, [place] Mother Deceased, Essential hypertension. age 60, Elevated Cholesterol  Social History Alcohol use Never consumed alcohol. Children 4 Current occupation CNA, Retired Tobacco use Never smoker. Advance Directives Living [place] with family  Medication History CeleBREX (200MG  Capsule, Oral) Active. Vitamin B Complex (Oral) Active. Centrum Silver (Oral) Active. Aspirin EC (325MG  Tablet DR, Oral two times daily) Active. Acyclovir (200MG  Tablet, Oral) Active. Lisinopril-HCTZ (100-25 Oral) Specific dose unknown - Active. Pravastatin Sodium (40MG  Tablet, Oral) Active. Meclizine HCl (25MG  Tablet, Oral as needed) Active. Omeprazole (20MG  Capsule DR, Oral as needed) Active.  Past Surgical History  Tubal Ligation Date: 18. Vein Stripping Date: 65. Right Breast Tumor Excision Date: 1966. Nonmalignant Left Wrist Tumor Excision Date: 2001. Hysterectomy (not due to cancer) - Partial Date: 1997. Knee Replacement, Total10/27/2014 Right.   Review of Systems General Not Present- Chills, Fatigue, Fever, Memory Loss, Night Sweats, Weight Gain and Weight Loss. Skin Not Present- Eczema, Hives, Itching, Lesions and Rash. HEENT Present- Tinnitus. Not Present- Dentures, Double Vision, Headache, Hearing Loss and Visual Loss. Respiratory Not Present- Allergies, Chronic Cough, Coughing up blood, Shortness of breath at rest and Shortness of breath with exertion. Cardiovascular Not Present- Chest Pain, Difficulty Breathing Lying Down, Murmur, Palpitations, Racing/skipping heartbeats and  Swelling. Gastrointestinal Not Present- Abdominal Pain, Bloody Stool, Constipation, Diarrhea, Difficulty Swallowing, Heartburn, Jaundice, Loss of appetitie, Nausea  and Vomiting. Female Genitourinary Not Present- Blood in Urine, Discharge, Flank Pain, Incontinence, Painful Urination, Urgency, Urinary frequency, Urinary Retention, Urinating at Night and Weak urinary stream. Musculoskeletal Present- Joint Pain, Joint Swelling and Morning Stiffness. Not Present- Back Pain, Muscle Pain, Muscle Weakness and Spasms. Neurological Not Present- Blackout spells, Difficulty with balance, Dizziness, Paralysis, Tremor and Weakness. Psychiatric Not Present- Insomnia.  Vitals Weight: 235.5 lb Height: 65.5in Body Surface Area: 2.13 m Body Mass Index: 38.59 kg/m  BP: 132/84 (Sitting, Right Arm, Standard)   Physical Exam General Mental Status -Alert, cooperative and good historian. General Appearance-pleasant, Not in acute distress. Orientation-Oriented X3. Build & Nutrition-Well nourished and Well developed.  Head and Neck Head-normocephalic, atraumatic . Neck Global Assessment - supple, no bruit auscultated on the right, no bruit auscultated on the left.  Eye Vision-Wears corrective lenses. Pupil - Bilateral-Regular and Round. Motion - Bilateral-EOMI.  Chest and Lung Exam Auscultation Breath sounds - clear at anterior chest wall and clear at posterior chest wall. Adventitious sounds - No Adventitious sounds.  Cardiovascular Auscultation Rhythm - Regular rate and rhythm. Heart Sounds - S1 WNL and S2 WNL. Murmurs & Other Heart Sounds - Auscultation of the heart reveals - No Murmurs.  Abdomen Inspection Contour - Generalized mild distention. Palpation/Percussion Tenderness - Abdomen is non-tender to palpation. Rigidity (guarding) - Abdomen is soft. Auscultation Auscultation of the abdomen reveals - Bowel sounds normal.  Female Genitourinary Note: Not done, not  pertinent to present illness   Musculoskeletal Note: On physical exam very pleasant, well developed female. Alert and oriented in no apparent distress. Evaluation of her hips show normal range of motion with no discomfort. The right knee looks excellent. No swelling. Range is 0-120 degrees. No tenderness or instability. The left knee shows a varus deformity. Range is 5-120. There is marked crepitus on range of motion. Tenderness medial greater than lateral with no instability.  RADIOGRAPHS: Radiographs that were taken at the [facility] office demonstrate bone on bone arthritis in the medial and patellofemoral compartments of both knees. The left knee is slightly worse radiographically. (Please note that the right knee has been replaced since these radiographs were taken)  Assessment & Plan Status post total knee replacement, right Primary localized osteoarthritis of left knee (M17.12) Note:Surgical Plans: Left Total Knee Replacement  Disposition: Home  PCP: [facility] - Patient has been seen preoperatively and felt to be stable for surgery.  IV TXA  Anesthesia Issues: NONE  Signed electronically by Joelene Millin, III PA-C

## 2015-05-01 DIAGNOSIS — E6609 Other obesity due to excess calories: Secondary | ICD-10-CM | POA: Diagnosis not present

## 2015-05-01 DIAGNOSIS — M9902 Segmental and somatic dysfunction of thoracic region: Secondary | ICD-10-CM | POA: Diagnosis not present

## 2015-05-01 DIAGNOSIS — M9905 Segmental and somatic dysfunction of pelvic region: Secondary | ICD-10-CM | POA: Diagnosis not present

## 2015-05-01 DIAGNOSIS — M9901 Segmental and somatic dysfunction of cervical region: Secondary | ICD-10-CM | POA: Diagnosis not present

## 2015-05-01 DIAGNOSIS — M5030 Other cervical disc degeneration, unspecified cervical region: Secondary | ICD-10-CM | POA: Diagnosis not present

## 2015-05-01 DIAGNOSIS — M9903 Segmental and somatic dysfunction of lumbar region: Secondary | ICD-10-CM | POA: Diagnosis not present

## 2015-05-01 DIAGNOSIS — M9904 Segmental and somatic dysfunction of sacral region: Secondary | ICD-10-CM | POA: Diagnosis not present

## 2015-05-01 DIAGNOSIS — I973 Postprocedural hypertension: Secondary | ICD-10-CM | POA: Diagnosis not present

## 2015-05-06 ENCOUNTER — Encounter: Payer: Self-pay | Admitting: Family Medicine

## 2015-05-06 ENCOUNTER — Encounter (INDEPENDENT_AMBULATORY_CARE_PROVIDER_SITE_OTHER): Payer: Self-pay

## 2015-05-06 ENCOUNTER — Ambulatory Visit (INDEPENDENT_AMBULATORY_CARE_PROVIDER_SITE_OTHER): Payer: Medicare Other | Admitting: Family Medicine

## 2015-05-06 ENCOUNTER — Ambulatory Visit: Payer: Self-pay | Admitting: Orthopedic Surgery

## 2015-05-06 VITALS — BP 132/79 | HR 91 | Temp 98.0°F | Ht 65.0 in | Wt 237.0 lb

## 2015-05-06 DIAGNOSIS — M129 Arthropathy, unspecified: Secondary | ICD-10-CM | POA: Diagnosis not present

## 2015-05-06 DIAGNOSIS — M19041 Primary osteoarthritis, right hand: Secondary | ICD-10-CM | POA: Diagnosis not present

## 2015-05-06 DIAGNOSIS — M1712 Unilateral primary osteoarthritis, left knee: Secondary | ICD-10-CM

## 2015-05-06 DIAGNOSIS — I10 Essential (primary) hypertension: Secondary | ICD-10-CM | POA: Diagnosis not present

## 2015-05-06 DIAGNOSIS — M1811 Unilateral primary osteoarthritis of first carpometacarpal joint, right hand: Secondary | ICD-10-CM

## 2015-05-06 MED ORDER — METHYLPREDNISOLONE 4 MG PO TBPK: ORAL_TABLET | ORAL | Status: DC

## 2015-05-06 NOTE — Progress Notes (Signed)
Subjective:  Patient ID: Bethany [name], female    DOB: 06-26-1951  Age: 64 y.o. MRN: [id number]  CC: surgical clearance   HPI Bethany [name] presents for upcoming LTKA. Pain right thumb. Using heat & purchased a TENS. Not relieving pain. Pain is moderate in severity. Near constant over the last several weeks. It has been increasing in intensity.  History Bethany [name] has a past medical history of Hypertension; Hyperlipidemia; Allergy (1997); Obesity; GERD (gastroesophageal reflux disease); Seasonal allergies; Pneumonia; Headache(784.0); and Arthritis.   She has past surgical history that includes Abdominal hysterectomy; Tubal ligation; phlebectomy; and Total knee arthroplasty (Right, 10/22/2013).   Her family history includes Arthritis in her brother; Diabetes in her sister; Heart disease in her mother and sister.She reports that she has never smoked. She has never used smokeless tobacco. She reports that she does not drink alcohol or use illicit drugs.  Current Outpatient Prescriptions on File Prior to Visit  Medication Sig Dispense Refill  . acyclovir (ZOVIRAX) 200 MG capsule Take 1 capsule (200 mg total) by mouth daily as needed (takes once daily as needed for flareups). 90 capsule 0  . aspirin 81 MG chewable tablet Chew 1 tablet (81 mg total) by mouth daily. 30 tablet 0  . B Complex-Biotin-FA (B COMPLEX 100 TR) TBCR Take 1 tablet by mouth every morning.    . celecoxib (CELEBREX) 200 MG capsule Take 200 mg by mouth at bedtime.    Marland Kitchen losartan-hydrochlorothiazide (HYZAAR) 100-25 MG per tablet Take 1 tablet by mouth daily. 90 tablet 1  . meclizine (ANTIVERT) 25 MG tablet Take 1 tablet (25 mg total) by mouth 3 (three) times daily as needed for dizziness. 90 tablet 0  . omeprazole (PRILOSEC) 20 MG capsule Take 1 capsule (20 mg total) by mouth daily. 90 capsule 1  . pravastatin (PRAVACHOL) 40 MG tablet Take 1 tablet (40 mg total) by mouth every morning. 90 tablet 1   No current  facility-administered medications on file prior to visit.    ROS Review of Systems  Constitutional: Negative for fever, chills, diaphoresis, appetite change, fatigue and unexpected weight change.  HENT: Negative for congestion, ear pain, hearing loss, postnasal drip, rhinorrhea, sneezing, sore throat and trouble swallowing.   Eyes: Negative for pain.  Respiratory: Negative for cough, chest tightness and shortness of breath.   Cardiovascular: Negative for chest pain and palpitations.  Gastrointestinal: Negative for nausea, vomiting, abdominal pain, diarrhea and constipation.  Genitourinary: Negative for dysuria, frequency and menstrual problem.  Musculoskeletal: Negative for joint swelling and arthralgias.  Skin: Negative for rash.  Neurological: Negative for dizziness, weakness, numbness and headaches.  Psychiatric/Behavioral: Negative for dysphoric mood and agitation.    Objective:  BP 132/79 mmHg  Pulse 91  Temp(Src) 98 F (36.7 C) (Oral)  Ht 5\' 5"  (1.651 m)  Wt 237 lb (107.502 kg)  BMI 39.44 kg/m2  BP Readings from Last 3 Encounters:  05/06/15 132/79  03/07/15 130/83  03/04/15 122/66    Wt Readings from Last 3 Encounters:  05/06/15 237 lb (107.502 kg)  03/07/15 242 lb (109.77 kg)  03/04/15 239 lb 6.4 oz (108.591 kg)     Physical Exam  Constitutional: She is oriented to person, place, and time. She appears well-developed and well-nourished. No distress.  HENT:  Head: Normocephalic and atraumatic.  Right Ear: External ear normal.  Left Ear: External ear normal.  Nose: Nose normal.  Mouth/Throat: Oropharynx is clear and moist.  Eyes: Conjunctivae and EOM are normal. Pupils are equal, round, and reactive  to light.  Neck: Normal range of motion. Neck supple. No thyromegaly present.  Cardiovascular: Normal rate, regular rhythm and normal heart sounds.   No murmur heard. Pulmonary/Chest: Effort normal and breath sounds normal. No respiratory distress. She has no  wheezes. She has no rales.  Abdominal: Soft. Bowel sounds are normal. She exhibits no distension. There is no tenderness.  Musculoskeletal: She exhibits tenderness (at base of rightThumb. Full range of motion with moderate tenderness. tHE JOINT IS NEUROVASCULARLY INTACT).  Lymphadenopathy:    She has no cervical adenopathy.  Neurological: She is alert and oriented to person, place, and time. She has normal reflexes.  Skin: Skin is warm and dry.  Psychiatric: She has a normal mood and affect. Her behavior is normal. Judgment and thought content normal.    No results found for: HGBA1C  Lab Results  Component Value Date   WBC 13.3* 03/07/2015   HGB 12.4 03/07/2015   HCT 39.5 03/07/2015   PLT 277 03/12/2014   GLUCOSE 117* 03/07/2015   CHOL 173 03/07/2015   TRIG 113 03/07/2015   HDL 41 03/07/2015   LDLCALC 109* 03/07/2015   ALT 14 03/07/2015   AST 22 03/07/2015   NA 143 03/07/2015   K 4.0 03/07/2015   CL 102 03/07/2015   CREATININE 0.87 03/07/2015   BUN 14 03/07/2015   CO2 26 03/07/2015   INR 0.98 10/16/2013    Dg Chest 2 View  03/12/2014   CLINICAL DATA:  Chest pain  EXAM: CHEST  2 VIEW  COMPARISON:  10/16/2013  FINDINGS: Lungs are clear. No pleural effusion or pneumothorax.  The heart is normal in size.  Mild degenerative changes of the visualized thoracolumbar spine disease.  IMPRESSION: No evidence of acute cardiopulmonary disease.   Electronically Signed   By: Julian Hy M.D.   On: 03/12/2014 17:30    Assessment & Plan:   Reginae was seen today for surgical clearance.  Diagnoses and all orders for this visit:  Arthritis of knee, left  Degenerative arthritis of thumb, right  Essential hypertension  Other orders -     methylPREDNISolone (MEDROL DOSEPAK) 4 MG TBPK tablet; Start with 6 on the first day and take one less each day until finished  I have discontinued Ms. Gores's Olopatadine HCl, levofloxacin, and HYDROcodone-homatropine. I am also having her  start on methylPREDNISolone. Additionally, I am having her maintain her celecoxib, B COMPLEX 100 TR, aspirin, omeprazole, losartan-hydrochlorothiazide, meclizine, pravastatin, and acyclovir.  Meds ordered this encounter  Medications  . methylPREDNISolone (MEDROL DOSEPAK) 4 MG TBPK tablet    Sig: Start with 6 on the first day and take one less each day until finished    Dispense:  21 tablet    Refill:  0     Follow-up: Return in about 3 months (around 08/06/2015).  Claretta Fraise, M.D.

## 2015-05-06 NOTE — Progress Notes (Signed)
Preoperative surgical orders have been place into the Epic hospital system for Bethany [name] on 05/06/2015, 8:33 AM  by Mickel Crow for surgery on 05-19-2015.  Preop Total Knee orders including Experal, IV Tylenol, and IV Decadron as long as there are no contraindications to the above medications. Arlee Muslim, PA-C

## 2015-05-06 NOTE — Patient Instructions (Signed)
Wear a thumb spica splint at all times except you may take it off briefly to shower. Also take it off 4 times daily to do range of motion exercises (as demonstrated) followed by about 10 minutes of ice pack before reapplying the splint

## 2015-05-13 ENCOUNTER — Encounter (HOSPITAL_COMMUNITY): Payer: Self-pay | Admitting: *Deleted

## 2015-05-13 NOTE — Patient Instructions (Addendum)
[place]  05/13/2015   Your procedure is scheduled on:   05-19-2015 Monday  Enter through [facility]  Entrance and follow signs to [facility]. Arrive at       0515 AM .  (Limit 1 person with you).  Call this number if you have problems the morning of surgery: [phone]  Or Presurgical Testing [phone].   For Living Will and/or Health Care Power Attorney Forms: please provide copy for your medical record,may bring AM of surgery(Forms should be already notarized -we do not provide this service).(Yes/ No information preferred today).  For Cpap use: Bring mask and tubing only.   Do not eat food/ or drink: After Midnight.     Take these medicines the morning of surgery with A SIP OF WATER: Pravastatin.Omeprazole.    Do not wear jewelry, make-up or nail polish.  Do not wear deodorant, lotions, powders, or perfumes.   Do not shave legs and under arms- 48 hours(2 days) prior to first CHG shower.(Shaving face and neck okay.)  Do not bring valuables to the hospital.(Hospital is not responsible for lost valuables).  Contacts, dentures or removable bridgework, body piercing, hair pins may not be worn into surgery.  Leave suitcase in the car. After surgery it may be brought to your room.  For patients admitted to the hospital, checkout time is 11:00 AM the day of discharge.(Restricted visitors-Any Persons displaying flu-like symptoms or illness).    Patients discharged the day of surgery will not be allowed to drive home. Must have responsible person with you x 24 hours once discharged.  Name and phone number of your driver:      Please read over the following fact sheets that you were given:  CHG(Chlorhexidine Gluconate 4% Surgical Soap) use, MRSA Information, Blood Transfusion fact sheet, Incentive Spirometry Instruction.  Remember : Type/Screen "Blue armbands" - may not be removed once applied(would result in being retested AM of surgery, if removed).          [place] - Preparing for Surgery Before surgery, you can play an important role.  Because skin is not sterile, your skin needs to be as free of germs as possible.  You can reduce the number of germs on your skin by washing with CHG (chlorahexidine gluconate) soap before surgery.  CHG is an antiseptic cleaner which kills germs and bonds with the skin to continue killing germs even after washing. Please DO NOT use if you have an allergy to CHG or antibacterial soaps.  If your skin becomes reddened/irritated stop using the CHG and inform your nurse when you arrive at Short Stay. Do not shave (including legs and underarms) for at least 48 hours prior to the first CHG shower.  You may shave your face/neck. Please follow these instructions carefully:  1.  Shower with CHG Soap the night before surgery and the  morning of Surgery.  2.  If you choose to wash your hair, wash your hair first as usual with your  normal  shampoo.  3.  After you shampoo, rinse your hair and body thoroughly to remove the  shampoo.                           4.  Use CHG as you would any other liquid soap.  You can apply chg directly  to the skin and wash  Gently with a scrungie or clean washcloth.  5.  Apply the CHG Soap to your body ONLY FROM THE NECK DOWN.   Do not use on face/ open                           Wound or open sores. Avoid contact with eyes, ears mouth and genitals (private parts).                       Wash face,  Genitals (private parts) with your normal soap.             6.  Wash thoroughly, paying special attention to the area where your surgery  will be performed.  7.  Thoroughly rinse your body with warm water from the neck down.  8.  DO NOT shower/wash with your normal soap after using and rinsing off  the CHG Soap.                9.  Pat yourself dry with a clean towel.            10.  Wear clean pajamas.            11.  Place clean sheets on your bed the night of your first shower and  do not  sleep with pets. Day of Surgery : Do not apply any lotions/deodorants the morning of surgery.  Please wear clean clothes to the hospital/surgery center.  FAILURE TO FOLLOW THESE INSTRUCTIONS MAY RESULT IN THE CANCELLATION OF YOUR SURGERY PATIENT SIGNATURE_________________________________  NURSE SIGNATURE__________________________________  ________________________________________________________________________   Adam Phenix  An incentive spirometer is a tool that can help keep your lungs clear and active. This tool measures how well you are filling your lungs with each breath. Taking long deep breaths may help reverse or decrease the chance of developing breathing (pulmonary) problems (especially infection) following:  A long period of time when you are unable to move or be active. BEFORE THE PROCEDURE   If the spirometer includes an indicator to show your best effort, your nurse or respiratory therapist will set it to a desired goal.  If possible, sit up straight or lean slightly forward. Try not to slouch.  Hold the incentive spirometer in an upright position. INSTRUCTIONS FOR USE   Sit on the edge of your bed if possible, or sit up as far as you can in bed or on a chair.  Hold the incentive spirometer in an upright position.  Breathe out normally.  Place the mouthpiece in your mouth and seal your lips tightly around it.  Breathe in slowly and as deeply as possible, raising the piston or the ball toward the top of the column.  Hold your breath for 3-5 seconds or for as long as possible. Allow the piston or ball to fall to the bottom of the column.  Remove the mouthpiece from your mouth and breathe out normally.  Rest for a few seconds and repeat Steps 1 through 7 at least 10 times every 1-2 hours when you are awake. Take your time and take a few normal breaths between deep breaths.  The spirometer may include an indicator to show your best effort. Use the  indicator as a goal to work toward during each repetition.  After each set of 10 deep breaths, practice coughing to be sure your lungs are clear. If you have an incision (the cut made at the time of  surgery), support your incision when coughing by placing a pillow or rolled up towels firmly against it. Once you are able to get out of bed, walk around indoors and cough well. You may stop using the incentive spirometer when instructed by your caregiver.  RISKS AND COMPLICATIONS  Take your time so you do not get dizzy or light-headed.  If you are in pain, you may need to take or ask for pain medication before doing incentive spirometry. It is harder to take a deep breath if you are having pain. AFTER USE  Rest and breathe slowly and easily.  It can be helpful to keep track of a log of your progress. Your caregiver can provide you with a simple table to help with this. If you are using the spirometer at home, follow these instructions: [place] IF:   You are having difficultly using the spirometer.  You have trouble using the spirometer as often as instructed.  Your pain medication is not giving enough relief while using the spirometer.  You develop fever of 100.5 F (38.1 C) or higher. SEEK IMMEDIATE MEDICAL CARE IF:   You cough up bloody sputum that had not been present before.  You develop fever of 102 F (38.9 C) or greater.  You develop worsening pain at or near the incision site. MAKE SURE YOU:   Understand these instructions.  Will watch your condition.  Will get help right away if you are not doing well or get worse. Document Released: 04/25/2007 Document Revised: 03/06/2012 Document Reviewed: 06/26/2007 ExitCare Patient Information 2014 ExitCare, [place].   ________________________________________________________________________  WHAT IS A BLOOD TRANSFUSION? Blood Transfusion Information  A transfusion is the replacement of blood or some of its parts. Blood  is made up of multiple cells which provide different functions.  Red blood cells carry oxygen and are used for blood loss replacement.  White blood cells fight against infection.  Platelets control bleeding.  Plasma helps clot blood.  Other blood products are available for specialized needs, such as hemophilia or other clotting disorders. BEFORE THE TRANSFUSION  Who gives blood for transfusions?   Healthy volunteers who are fully evaluated to make sure their blood is safe. This is blood bank blood. Transfusion therapy is the safest it has ever been in the practice of medicine. Before blood is taken from a donor, a complete history is taken to make sure that person has no history of diseases nor engages in risky social behavior (examples are intravenous drug use or sexual activity with multiple partners). The donor's travel history is screened to minimize risk of transmitting infections, such as malaria. The donated blood is tested for signs of infectious diseases, such as HIV and hepatitis. The blood is then tested to be sure it is compatible with you in order to minimize the chance of a transfusion reaction. If you or a relative donates blood, this is often done in anticipation of surgery and is not appropriate for emergency situations. It takes many days to process the donated blood. RISKS AND COMPLICATIONS Although transfusion therapy is very safe and saves many lives, the main dangers of transfusion include:   Getting an infectious disease.  Developing a transfusion reaction. This is an allergic reaction to something in the blood you were given. Every precaution is taken to prevent this. The decision to have a blood transfusion has been considered carefully by your caregiver before blood is given. Blood is not given unless the benefits outweigh the risks. AFTER THE TRANSFUSION  Right after receiving a blood transfusion, you will usually feel much better and more energetic. This is  especially true if your red blood cells have gotten low (anemic). The transfusion raises the level of the red blood cells which carry oxygen, and this usually causes an energy increase.  The nurse administering the transfusion will monitor you carefully for complications. HOME CARE INSTRUCTIONS  No special instructions are needed after a transfusion. You may find your energy is better. Speak with your caregiver about any limitations on activity for underlying diseases you may have. SEEK MEDICAL CARE IF:   Your condition is not improving after your transfusion.  You develop redness or irritation at the intravenous (IV) site. SEEK IMMEDIATE MEDICAL CARE IF:  Any of the following symptoms occur over the next 12 hours:  Shaking chills.  You have a temperature by mouth above 102 F (38.9 C), not controlled by medicine.  Chest, back, or muscle pain.  People around you feel you are not acting correctly or are confused.  Shortness of breath or difficulty breathing.  Dizziness and fainting.  You get a rash or develop hives.  You have a decrease in urine output.  Your urine turns a dark color or changes to pink, red, or brown. Any of the following symptoms occur over the next 10 days:  You have a temperature by mouth above 102 F (38.9 C), not controlled by medicine.  Shortness of breath.  Weakness after normal activity.  The white part of the eye turns yellow (jaundice).  You have a decrease in the amount of urine or are urinating less often.  Your urine turns a dark color or changes to pink, red, or brown. Document Released: 12/10/2000 Document Revised: 03/06/2012 Document Reviewed: 07/29/2008 [facility] Patient Information 2014 [place], [place].  _______________________________________________________________________

## 2015-05-14 ENCOUNTER — Encounter (HOSPITAL_COMMUNITY)
Admission: RE | Admit: 2015-05-14 | Discharge: 2015-05-14 | Disposition: A | Discharge status: Home / Self Care | Payer: Medicare Other | Source: Ambulatory Visit | Attending: Orthopedic Surgery | Admitting: Orthopedic Surgery

## 2015-05-14 ENCOUNTER — Encounter (HOSPITAL_COMMUNITY): Payer: Self-pay

## 2015-05-14 DIAGNOSIS — I10 Essential (primary) hypertension: Secondary | ICD-10-CM | POA: Insufficient documentation

## 2015-05-14 DIAGNOSIS — Z01812 Encounter for preprocedural laboratory examination: Secondary | ICD-10-CM | POA: Diagnosis not present

## 2015-05-14 DIAGNOSIS — Z01810 Encounter for preprocedural cardiovascular examination: Secondary | ICD-10-CM | POA: Diagnosis not present

## 2015-05-14 LAB — SURGICAL PCR SCREEN
MRSA, PCR: NEGATIVE
STAPHYLOCOCCUS AUREUS: NEGATIVE

## 2015-05-14 LAB — URINALYSIS, ROUTINE W REFLEX MICROSCOPIC
Bilirubin Urine: NEGATIVE
Glucose, UA: NEGATIVE mg/dL
HGB URINE DIPSTICK: NEGATIVE
Ketones, ur: NEGATIVE mg/dL
Leukocytes, UA: NEGATIVE
Nitrite: NEGATIVE
PH: 6 (ref 5.0–8.0)
Protein, ur: NEGATIVE mg/dL
SPECIFIC GRAVITY, URINE: 1.015 (ref 1.005–1.030)
UROBILINOGEN UA: 0.2 mg/dL (ref 0.0–1.0)

## 2015-05-14 LAB — CBC
HCT: 37.4 % (ref 36.0–46.0)
Hemoglobin: 12.5 g/dL (ref 12.0–15.0)
MCH: 28 pg (ref 26.0–34.0)
MCHC: 33.4 g/dL (ref 30.0–36.0)
MCV: 83.9 fL (ref 78.0–100.0)
PLATELETS: 268 10*3/uL (ref 150–400)
RBC: 4.46 MIL/uL (ref 3.87–5.11)
RDW: 14.1 % (ref 11.5–15.5)
WBC: 9.4 10*3/uL (ref 4.0–10.5)

## 2015-05-14 LAB — COMPREHENSIVE METABOLIC PANEL
ALBUMIN: 3.9 g/dL (ref 3.5–5.0)
ALK PHOS: 84 U/L (ref 38–126)
ALT: 19 U/L (ref 14–54)
AST: 28 U/L (ref 15–41)
Anion gap: 10 (ref 5–15)
BUN: 13 mg/dL (ref 6–20)
CO2: 28 mmol/L (ref 22–32)
Calcium: 9.6 mg/dL (ref 8.9–10.3)
Chloride: 103 mmol/L (ref 101–111)
Creatinine, Ser: 0.8 mg/dL (ref 0.44–1.00)
GFR calc Af Amer: >60 mL/min (ref >60)
GFR calc non Af Amer: >60 mL/min (ref >60)
Glucose, Bld: 118 mg/dL — ABNORMAL HIGH (ref 65–99)
POTASSIUM: 3.3 mmol/L — AB (ref 3.5–5.1)
Sodium: 141 mmol/L (ref 135–145)
TOTAL PROTEIN: 7.7 g/dL (ref 6.5–8.1)
Total Bilirubin: 1.4 mg/dL — ABNORMAL HIGH (ref 0.3–1.2)

## 2015-05-14 LAB — PROTIME-INR
INR: 0.99 (ref 0.00–1.49)
Prothrombin Time: 13.2 seconds (ref 11.6–15.2)

## 2015-05-14 LAB — APTT: APTT: 32 s (ref 24–37)

## 2015-05-16 NOTE — Progress Notes (Signed)
Pt notified of surgery day and time change to 06/11/15 / instructed to arrive at short stay at 6:30 am / npo after midnight.

## 2015-05-19 LAB — TYPE AND SCREEN
ABO/RH(D): O NEG
Antibody Screen: NEGATIVE

## 2015-05-27 ENCOUNTER — Ambulatory Visit: Payer: Self-pay | Admitting: Orthopedic Surgery

## 2015-05-27 NOTE — Progress Notes (Signed)
Preoperative surgical orders have been place into the Epic hospital system for Bethany [name] on 05/27/2015, 5:16 PM  by Mickel Crow for surgery on 06-11-2015.  Preop Total Knee orders including Experal, IV Tylenol, and IV Decadron as long as there are no contraindications to the above medications. Arlee Muslim, PA-C

## 2015-06-02 ENCOUNTER — Telehealth: Payer: Self-pay | Admitting: Pulmonary Disease

## 2015-06-02 DIAGNOSIS — G4733 Obstructive sleep apnea (adult) (pediatric): Secondary | ICD-10-CM

## 2015-06-02 NOTE — Telephone Encounter (Signed)
Called spoke with pt. She reports she needs an order sent to [facility] for new supplies/mask. i have placed this. Nothing further needed

## 2015-06-05 NOTE — H&P (Signed)
Bethany [name] DOB: [birth year] Married / Language: English / Race: Black or African American Female Date of Admission: 06/11/2015 CC: Left knee pain History of Present Illness The patient is a 64 year old female who comes in for a preoperative History and Physical. The patient is scheduled for a left total knee arthroplasty to be performed by Dr. Dione Plover. Aluisio, MD at [facility] on 06-11-2015. The patient is a 64 year old female and comes in over a year out from right total knee arthroplasty. The patient states that she is doing well at this time. control at this time and describe their pain as mild (having more issues with the "tendons" in the knee). They are currently on no medication for their pain. The patient is currently doing home exercise program. The patient feels that they are progressing well at this time. She is really pleased with how she is doing with her right knee. She said she has minimal discomfort. She is getting around and doing a lot more. The only thing holding her back is her left knee. She has documented bone on bone arthritis, medial and patellofemoral left knee. She has had injections without benefit. She is ready to go ahead and proceed with the left knee replacement as she is very happy and very satisfied with her right knee. They have been treated conservatively in the past for the above stated problem and despite conservative measures, they continue to have progressive pain and severe functional limitations and dysfunction. They have failed non-operative management including home exercise, medications, and injections. It is felt that they would benefit from undergoing total joint replacement. Risks and benefits of the procedure have been discussed with the patient and they elect to proceed with surgery. There are no active contraindications to surgery such as ongoing infection or rapidly progressive neurological disease.  Problem List/Past  Medical Menopause Hypertension Hypercholesterolemia Varicose veins Primary osteoarthritis of knee, right Status post total knee replacement, right Vertigo Tinnitus Cataract Migraine Headache Pneumonia Past Recent History Sleep Apnea uses CPAP Hiatal Hernia  Allergies No Known Drug Allergies  Family History  Father Deceased. age 69, [place] Mother Deceased, Essential hypertension. age 74, Elevated Cholesterol  Social History Alcohol use Never consumed alcohol. Children 4 Current occupation CNA, Retired Tobacco use Never smoker. Advance Directives Living [place] [employer] with family  Medication History CeleBREX (200MG  Capsule, Oral) Active. Vitamin B Complex (Oral) Active. Centrum Silver (Oral) Active. Aspirin EC (325MG  Tablet DR, Oral two times daily) Active. Acyclovir (200MG  Tablet, Oral) Active. Lisinopril-HCTZ (100-25 Oral) Specific dose unknown - Active. Pravastatin Sodium (40MG  Tablet, Oral) Active. Meclizine HCl (25MG  Tablet, Oral as needed) Active. Omeprazole (20MG  Capsule DR, Oral as needed) Active.  Past Surgical History  Tubal Ligation Date: 84. Vein Stripping Date: 42. Right Breast Tumor Excision Date: 1966. Nonmalignant Left Wrist Tumor Excision Date: 2001. Hysterectomy (not due to cancer) - Partial Date: 1997. Knee Replacement, Total10/27/2014 Right.   Review of Systems General Not Present- Chills, Fatigue, Fever, Memory Loss, Night Sweats, Weight Gain and Weight Loss. Skin Not Present- Eczema, Hives, Itching, Lesions and Rash. HEENT Present- Tinnitus. Not Present- Dentures, Double Vision, Headache, Hearing Loss and Visual Loss. Respiratory Not Present- Allergies, Chronic Cough, Coughing up blood, Shortness of breath at rest and Shortness of breath with exertion. Cardiovascular Not Present- Chest Pain, Difficulty Breathing Lying Down, Murmur, Palpitations, Racing/skipping heartbeats and  Swelling. Gastrointestinal Not Present- Abdominal Pain, Bloody Stool, Constipation, Diarrhea, Difficulty Swallowing, Heartburn, Jaundice, Loss of appetitie, Nausea and  Vomiting. Female Genitourinary Not Present- Blood in Urine, Discharge, Flank Pain, Incontinence, Painful Urination, Urgency, Urinary frequency, Urinary Retention, Urinating at Night and Weak urinary stream. Musculoskeletal Present- Joint Pain, Joint Swelling and Morning Stiffness. Not Present- Back Pain, Muscle Pain, Muscle Weakness and Spasms. Neurological Not Present- Blackout spells, Difficulty with balance, Dizziness, Paralysis, Tremor and Weakness. Psychiatric Not Present- Insomnia.  Vitals Weight: 235.5 lb Height: 65.5in Body Surface Area: 2.13 m Body Mass Index: 38.59 kg/m  BP: 132/84 (Sitting, Right Arm, Standard)   Physical Exam General Mental Status -Alert, cooperative and good historian. General Appearance-pleasant, Not in acute distress. Orientation-Oriented X3. Build & Nutrition-Well nourished and Well developed.  Head and Neck Head-normocephalic, atraumatic . Neck Global Assessment - supple, no bruit auscultated on the right, no bruit auscultated on the left.  Eye Vision-Wears corrective lenses. Pupil - Bilateral-Regular and Round. Motion - Bilateral-EOMI.  Chest and Lung Exam Auscultation Breath sounds - clear at anterior chest wall and clear at posterior chest wall. Adventitious sounds - No Adventitious sounds.  Cardiovascular Auscultation Rhythm - Regular rate and rhythm. Heart Sounds - S1 WNL and S2 WNL. Murmurs & Other Heart Sounds - Auscultation of the heart reveals - No Murmurs.  Abdomen Inspection Contour - Generalized mild distention. Palpation/Percussion Tenderness - Abdomen is non-tender to palpation. Rigidity (guarding) - Abdomen is soft. Auscultation Auscultation of the abdomen reveals - Bowel sounds normal.  Female Genitourinary Note: Not done, not  pertinent to present illness   Musculoskeletal Note: On physical exam very pleasant, well developed female. Alert and oriented in no apparent distress. Evaluation of her hips show normal range of motion with no discomfort. The right knee looks excellent. No swelling. Range is 0-120 degrees. No tenderness or instability. The left knee shows a varus deformity. Range is 5-120. There is marked crepitus on range of motion. Tenderness medial greater than lateral with no instability.  RADIOGRAPHS: Radiographs that were taken at the [facility] office demonstrate bone on bone arthritis in the medial and patellofemoral compartments of both knees. The left knee is slightly worse radiographically. (Please note that the right knee has been replaced since these radiographs were taken)  Assessment & Plan Status post total knee replacement, right Primary localized osteoarthritis of left knee (M17.12) Note:Surgical Plans: Left Total Knee Replacement  Disposition: Home  PCP: [place] - Patient has been seen preoperatively and felt to be stable for surgery.  IV TXA  Anesthesia Issues: NONE  Signed electronically by Joelene Millin, III PA-C

## 2015-06-11 ENCOUNTER — Inpatient Hospital Stay (HOSPITAL_COMMUNITY)
Admission: RE | Admit: 2015-06-11 | Discharge: 2015-06-13 | DRG: 470 | Disposition: A | Discharge status: Home Health | Payer: Medicare Other | Source: Ambulatory Visit | Attending: Orthopedic Surgery | Admitting: Orthopedic Surgery

## 2015-06-11 ENCOUNTER — Encounter (HOSPITAL_COMMUNITY): Payer: Self-pay | Admitting: Certified Registered Nurse Anesthetist

## 2015-06-11 ENCOUNTER — Encounter (HOSPITAL_COMMUNITY)
Admission: RE | Disposition: A | Discharge status: Home Health | Payer: Self-pay | Source: Ambulatory Visit | Attending: Orthopedic Surgery

## 2015-06-11 ENCOUNTER — Inpatient Hospital Stay (HOSPITAL_COMMUNITY): Payer: Medicare Other | Admitting: Certified Registered Nurse Anesthetist

## 2015-06-11 DIAGNOSIS — M179 Osteoarthritis of knee, unspecified: Secondary | ICD-10-CM | POA: Diagnosis present

## 2015-06-11 DIAGNOSIS — Z96651 Presence of right artificial knee joint: Secondary | ICD-10-CM | POA: Diagnosis not present

## 2015-06-11 DIAGNOSIS — Z01812 Encounter for preprocedural laboratory examination: Secondary | ICD-10-CM | POA: Diagnosis not present

## 2015-06-11 DIAGNOSIS — G4730 Sleep apnea, unspecified: Secondary | ICD-10-CM | POA: Diagnosis present

## 2015-06-11 DIAGNOSIS — K219 Gastro-esophageal reflux disease without esophagitis: Secondary | ICD-10-CM | POA: Diagnosis present

## 2015-06-11 DIAGNOSIS — M25562 Pain in left knee: Secondary | ICD-10-CM | POA: Diagnosis not present

## 2015-06-11 DIAGNOSIS — Z6839 Body mass index (BMI) 39.0-39.9, adult: Secondary | ICD-10-CM

## 2015-06-11 DIAGNOSIS — Z7982 Long term (current) use of aspirin: Secondary | ICD-10-CM

## 2015-06-11 DIAGNOSIS — E780 Pure hypercholesterolemia: Secondary | ICD-10-CM | POA: Diagnosis present

## 2015-06-11 DIAGNOSIS — I10 Essential (primary) hypertension: Secondary | ICD-10-CM | POA: Diagnosis present

## 2015-06-11 DIAGNOSIS — E669 Obesity, unspecified: Secondary | ICD-10-CM | POA: Diagnosis present

## 2015-06-11 DIAGNOSIS — M1712 Unilateral primary osteoarthritis, left knee: Secondary | ICD-10-CM | POA: Diagnosis not present

## 2015-06-11 DIAGNOSIS — M1710 Unilateral primary osteoarthritis, unspecified knee: Secondary | ICD-10-CM | POA: Diagnosis present

## 2015-06-11 HISTORY — PX: TOTAL KNEE ARTHROPLASTY: SHX125

## 2015-06-11 HISTORY — DX: Sleep apnea, unspecified: G47.30

## 2015-06-11 LAB — TYPE AND SCREEN
ABO/RH(D): O NEG
ANTIBODY SCREEN: NEGATIVE

## 2015-06-11 LAB — URINALYSIS, ROUTINE W REFLEX MICROSCOPIC
Bilirubin Urine: NEGATIVE
GLUCOSE, UA: NEGATIVE mg/dL
Hgb urine dipstick: NEGATIVE
KETONES UR: NEGATIVE mg/dL
Leukocytes, UA: NEGATIVE
Nitrite: NEGATIVE
PH: 5.5 (ref 5.0–8.0)
Protein, ur: NEGATIVE mg/dL
SPECIFIC GRAVITY, URINE: 1.017 (ref 1.005–1.030)
Urobilinogen, UA: 0.2 mg/dL (ref 0.0–1.0)

## 2015-06-11 SURGERY — ARTHROPLASTY, KNEE, TOTAL
Anesthesia: General | Site: Knee | Laterality: Left

## 2015-06-11 MED ORDER — FENTANYL CITRATE (PF) 250 MCG/5ML IJ SOLN
INTRAMUSCULAR | Status: AC
  Filled 2015-06-11: qty 5

## 2015-06-11 MED ORDER — SODIUM CHLORIDE 0.9 % IV SOLN: INTRAVENOUS | Status: DC

## 2015-06-11 MED ORDER — POTASSIUM CHLORIDE IN NACL 20-0.9 MEQ/L-% IV SOLN
INTRAVENOUS | Status: DC
  Administered 2015-06-11: 16:00:00 via INTRAVENOUS
  Filled 2015-06-11 (×5): qty 1000

## 2015-06-11 MED ORDER — HYDROMORPHONE HCL 1 MG/ML IJ SOLN
INTRAMUSCULAR | Status: DC | PRN
  Administered 2015-06-11 (×2): 1 mg via INTRAVENOUS

## 2015-06-11 MED ORDER — METOCLOPRAMIDE HCL 5 MG/ML IJ SOLN: 5.0000 mg | Freq: Three times a day (TID) | INTRAMUSCULAR | Status: DC | PRN

## 2015-06-11 MED ORDER — CHLORHEXIDINE GLUCONATE 4 % EX LIQD: 60.0000 mL | Freq: Once | CUTANEOUS | Status: DC

## 2015-06-11 MED ORDER — BUPIVACAINE HCL (PF) 0.25 % IJ SOLN
INTRAMUSCULAR | Status: AC
  Filled 2015-06-11: qty 30

## 2015-06-11 MED ORDER — ACETAMINOPHEN 10 MG/ML IV SOLN
INTRAVENOUS | Status: AC
  Filled 2015-06-11: qty 100

## 2015-06-11 MED ORDER — FENTANYL CITRATE (PF) 100 MCG/2ML IJ SOLN
25.0000 ug | INTRAMUSCULAR | Status: AC | PRN
  Administered 2015-06-11 (×2): 25 ug via INTRAVENOUS
  Administered 2015-06-11: 50 ug via INTRAVENOUS
  Administered 2015-06-11: 25 ug via INTRAVENOUS
  Administered 2015-06-11: 50 ug via INTRAVENOUS
  Administered 2015-06-11: 25 ug via INTRAVENOUS

## 2015-06-11 MED ORDER — SODIUM CHLORIDE 0.9 % IR SOLN
Status: DC | PRN
  Administered 2015-06-11: 1000 mL

## 2015-06-11 MED ORDER — MORPHINE SULFATE 2 MG/ML IJ SOLN: 1.0000 mg | INTRAMUSCULAR | Status: DC | PRN

## 2015-06-11 MED ORDER — BISACODYL 10 MG RE SUPP: 10.0000 mg | Freq: Every day | RECTAL | Status: DC | PRN

## 2015-06-11 MED ORDER — EPHEDRINE SULFATE 50 MG/ML IJ SOLN
INTRAMUSCULAR | Status: AC
  Filled 2015-06-11: qty 1

## 2015-06-11 MED ORDER — ROCURONIUM BROMIDE 100 MG/10ML IV SOLN
INTRAVENOUS | Status: DC | PRN
  Administered 2015-06-11: 30 mg via INTRAVENOUS

## 2015-06-11 MED ORDER — METOCLOPRAMIDE HCL 10 MG PO TABS: 5.0000 mg | ORAL_TABLET | Freq: Three times a day (TID) | ORAL | Status: DC | PRN

## 2015-06-11 MED ORDER — LOSARTAN POTASSIUM 50 MG PO TABS
100.0000 mg | ORAL_TABLET | Freq: Every day | ORAL | Status: DC
  Administered 2015-06-13: 100 mg via ORAL
  Filled 2015-06-11 (×2): qty 2

## 2015-06-11 MED ORDER — MECLIZINE HCL 25 MG PO TABS
25.0000 mg | ORAL_TABLET | Freq: Three times a day (TID) | ORAL | Status: DC | PRN
  Filled 2015-06-11: qty 1

## 2015-06-11 MED ORDER — CEFAZOLIN SODIUM-DEXTROSE 2-3 GM-% IV SOLR
INTRAVENOUS | Status: AC
  Filled 2015-06-11: qty 50

## 2015-06-11 MED ORDER — CEFAZOLIN SODIUM-DEXTROSE 2-3 GM-% IV SOLR
2.0000 g | Freq: Four times a day (QID) | INTRAVENOUS | Status: AC
  Administered 2015-06-11 (×2): 2 g via INTRAVENOUS
  Filled 2015-06-11 (×2): qty 50

## 2015-06-11 MED ORDER — MIDAZOLAM HCL 2 MG/2ML IJ SOLN
INTRAMUSCULAR | Status: AC
  Filled 2015-06-11: qty 2

## 2015-06-11 MED ORDER — ONDANSETRON HCL 4 MG/2ML IJ SOLN
INTRAMUSCULAR | Status: AC
  Filled 2015-06-11: qty 2

## 2015-06-11 MED ORDER — SUCCINYLCHOLINE CHLORIDE 20 MG/ML IJ SOLN
INTRAMUSCULAR | Status: DC | PRN
  Administered 2015-06-11: 100 mg via INTRAVENOUS

## 2015-06-11 MED ORDER — CEFAZOLIN SODIUM-DEXTROSE 2-3 GM-% IV SOLR
2.0000 g | INTRAVENOUS | Status: AC
  Administered 2015-06-11: 2 g via INTRAVENOUS

## 2015-06-11 MED ORDER — SODIUM CHLORIDE 0.9 % IJ SOLN
INTRAMUSCULAR | Status: DC | PRN
  Administered 2015-06-11: 30 mL

## 2015-06-11 MED ORDER — LACTATED RINGERS IV SOLN: INTRAVENOUS | Status: DC

## 2015-06-11 MED ORDER — LIDOCAINE HCL (CARDIAC) 20 MG/ML IV SOLN
INTRAVENOUS | Status: AC
  Filled 2015-06-11: qty 5

## 2015-06-11 MED ORDER — SODIUM CHLORIDE 0.9 % IJ SOLN
INTRAMUSCULAR | Status: AC
  Filled 2015-06-11: qty 50

## 2015-06-11 MED ORDER — SODIUM CHLORIDE 0.9 % IV SOLN
1000.0000 mg | INTRAVENOUS | Status: AC
  Administered 2015-06-11: 1000 mg via INTRAVENOUS
  Filled 2015-06-11: qty 10

## 2015-06-11 MED ORDER — METHOCARBAMOL 1000 MG/10ML IJ SOLN
500.0000 mg | Freq: Four times a day (QID) | INTRAVENOUS | Status: DC | PRN
  Administered 2015-06-11: 500 mg via INTRAVENOUS
  Filled 2015-06-11 (×2): qty 5

## 2015-06-11 MED ORDER — LOSARTAN POTASSIUM-HCTZ 100-25 MG PO TABS: 1.0000 | ORAL_TABLET | Freq: Every day | ORAL | Status: DC

## 2015-06-11 MED ORDER — BUPIVACAINE LIPOSOME 1.3 % IJ SUSP
20.0000 mL | Freq: Once | INTRAMUSCULAR | Status: DC
  Filled 2015-06-11: qty 20

## 2015-06-11 MED ORDER — PROPOFOL 10 MG/ML IV BOLUS
INTRAVENOUS | Status: AC
  Filled 2015-06-11: qty 20

## 2015-06-11 MED ORDER — RIVAROXABAN 10 MG PO TABS
10.0000 mg | ORAL_TABLET | Freq: Every day | ORAL | Status: DC
  Administered 2015-06-12 – 2015-06-13 (×2): 10 mg via ORAL
  Filled 2015-06-11 (×3): qty 1

## 2015-06-11 MED ORDER — DIPHENHYDRAMINE HCL 12.5 MG/5ML PO ELIX: 12.5000 mg | ORAL_SOLUTION | ORAL | Status: DC | PRN

## 2015-06-11 MED ORDER — ACETAMINOPHEN 10 MG/ML IV SOLN
1000.0000 mg | Freq: Once | INTRAVENOUS | Status: AC
  Administered 2015-06-11: 1000 mg via INTRAVENOUS
  Filled 2015-06-11: qty 100

## 2015-06-11 MED ORDER — DEXAMETHASONE SODIUM PHOSPHATE 10 MG/ML IJ SOLN
INTRAMUSCULAR | Status: AC
Start: 2015-06-11 — End: 2015-06-11
  Filled 2015-06-11: qty 1

## 2015-06-11 MED ORDER — ACETAMINOPHEN 325 MG PO TABS
650.0000 mg | ORAL_TABLET | Freq: Four times a day (QID) | ORAL | Status: DC | PRN
Start: 2015-06-12 — End: 2015-06-13

## 2015-06-11 MED ORDER — ONDANSETRON HCL 4 MG/2ML IJ SOLN: 4.0000 mg | Freq: Four times a day (QID) | INTRAMUSCULAR | Status: DC | PRN

## 2015-06-11 MED ORDER — ONDANSETRON HCL 4 MG/2ML IJ SOLN
INTRAMUSCULAR | Status: DC | PRN
  Administered 2015-06-11: 4 mg via INTRAVENOUS

## 2015-06-11 MED ORDER — NEOSTIGMINE METHYLSULFATE 10 MG/10ML IV SOLN
INTRAVENOUS | Status: DC | PRN
  Administered 2015-06-11: 3 mg via INTRAVENOUS

## 2015-06-11 MED ORDER — DEXAMETHASONE SODIUM PHOSPHATE 10 MG/ML IJ SOLN
10.0000 mg | Freq: Once | INTRAMUSCULAR | Status: AC
  Administered 2015-06-11: 10 mg via INTRAVENOUS

## 2015-06-11 MED ORDER — BUPIVACAINE LIPOSOME 1.3 % IJ SUSP
INTRAMUSCULAR | Status: DC | PRN
  Administered 2015-06-11: 20 mL

## 2015-06-11 MED ORDER — FENTANYL CITRATE (PF) 100 MCG/2ML IJ SOLN
INTRAMUSCULAR | Status: AC
  Filled 2015-06-11: qty 2

## 2015-06-11 MED ORDER — MENTHOL 3 MG MT LOZG: 1.0000 | LOZENGE | OROMUCOSAL | Status: DC | PRN

## 2015-06-11 MED ORDER — DEXAMETHASONE SODIUM PHOSPHATE 10 MG/ML IJ SOLN
10.0000 mg | Freq: Once | INTRAMUSCULAR | Status: AC
  Administered 2015-06-12: 10 mg via INTRAVENOUS
  Filled 2015-06-11: qty 1

## 2015-06-11 MED ORDER — METHOCARBAMOL 500 MG PO TABS
500.0000 mg | ORAL_TABLET | Freq: Four times a day (QID) | ORAL | Status: DC | PRN
  Administered 2015-06-11 – 2015-06-13 (×5): 500 mg via ORAL
  Filled 2015-06-11 (×5): qty 1

## 2015-06-11 MED ORDER — ONDANSETRON HCL 4 MG/2ML IJ SOLN: 4.0000 mg | Freq: Once | INTRAMUSCULAR | Status: DC | PRN

## 2015-06-11 MED ORDER — ONDANSETRON HCL 4 MG PO TABS: 4.0000 mg | ORAL_TABLET | Freq: Four times a day (QID) | ORAL | Status: DC | PRN

## 2015-06-11 MED ORDER — PROPOFOL 10 MG/ML IV BOLUS
INTRAVENOUS | Status: DC | PRN
  Administered 2015-06-11: 150 mg via INTRAVENOUS

## 2015-06-11 MED ORDER — POLYETHYLENE GLYCOL 3350 17 G PO PACK
17.0000 g | PACK | Freq: Every day | ORAL | Status: DC | PRN
  Administered 2015-06-13: 17 g via ORAL
  Filled 2015-06-11: qty 1

## 2015-06-11 MED ORDER — SODIUM CHLORIDE 0.9 % IJ SOLN
INTRAMUSCULAR | Status: AC
  Filled 2015-06-11: qty 10

## 2015-06-11 MED ORDER — PHENOL 1.4 % MT LIQD: 1.0000 | OROMUCOSAL | Status: DC | PRN

## 2015-06-11 MED ORDER — TRAMADOL HCL 50 MG PO TABS
50.0000 mg | ORAL_TABLET | Freq: Four times a day (QID) | ORAL | Status: DC | PRN
  Administered 2015-06-13: 100 mg via ORAL
  Filled 2015-06-11: qty 2

## 2015-06-11 MED ORDER — ACETAMINOPHEN 500 MG PO TABS
1000.0000 mg | ORAL_TABLET | Freq: Four times a day (QID) | ORAL | Status: AC
  Administered 2015-06-11 – 2015-06-12 (×4): 1000 mg via ORAL
  Filled 2015-06-11 (×4): qty 2

## 2015-06-11 MED ORDER — GLYCOPYRROLATE 0.2 MG/ML IJ SOLN
INTRAMUSCULAR | Status: DC | PRN
  Administered 2015-06-11: 0.4 mg via INTRAVENOUS

## 2015-06-11 MED ORDER — ROCURONIUM BROMIDE 100 MG/10ML IV SOLN
INTRAVENOUS | Status: AC
  Filled 2015-06-11: qty 1

## 2015-06-11 MED ORDER — DOCUSATE SODIUM 100 MG PO CAPS
100.0000 mg | ORAL_CAPSULE | Freq: Two times a day (BID) | ORAL | Status: DC
  Administered 2015-06-11 – 2015-06-13 (×4): 100 mg via ORAL

## 2015-06-11 MED ORDER — GLYCOPYRROLATE 0.2 MG/ML IJ SOLN
INTRAMUSCULAR | Status: AC
  Filled 2015-06-11: qty 2

## 2015-06-11 MED ORDER — MIDAZOLAM HCL 5 MG/5ML IJ SOLN
INTRAMUSCULAR | Status: DC | PRN
  Administered 2015-06-11: 2 mg via INTRAVENOUS

## 2015-06-11 MED ORDER — LACTATED RINGERS IV SOLN
INTRAVENOUS | Status: DC | PRN
  Administered 2015-06-11 (×2): via INTRAVENOUS

## 2015-06-11 MED ORDER — HYDROMORPHONE HCL 2 MG/ML IJ SOLN
INTRAMUSCULAR | Status: AC
  Filled 2015-06-11: qty 1

## 2015-06-11 MED ORDER — KETOROLAC TROMETHAMINE 15 MG/ML IJ SOLN
7.5000 mg | Freq: Four times a day (QID) | INTRAMUSCULAR | Status: AC | PRN
  Administered 2015-06-11 (×2): 7.5 mg via INTRAVENOUS
  Filled 2015-06-11 (×2): qty 1

## 2015-06-11 MED ORDER — ACETAMINOPHEN 650 MG RE SUPP: 650.0000 mg | Freq: Four times a day (QID) | RECTAL | Status: DC | PRN

## 2015-06-11 MED ORDER — HYDROCHLOROTHIAZIDE 25 MG PO TABS
25.0000 mg | ORAL_TABLET | Freq: Every day | ORAL | Status: DC
  Administered 2015-06-13: 25 mg via ORAL
  Filled 2015-06-11 (×2): qty 1

## 2015-06-11 MED ORDER — BUPIVACAINE HCL 0.25 % IJ SOLN
INTRAMUSCULAR | Status: DC | PRN
  Administered 2015-06-11: 20 mL

## 2015-06-11 MED ORDER — LIDOCAINE HCL (PF) 2 % IJ SOLN
INTRAMUSCULAR | Status: DC | PRN
  Administered 2015-06-11: 40 mg via INTRADERMAL

## 2015-06-11 MED ORDER — FLEET ENEMA 7-19 GM/118ML RE ENEM: 1.0000 | ENEMA | Freq: Once | RECTAL | Status: AC | PRN

## 2015-06-11 MED ORDER — FENTANYL CITRATE (PF) 100 MCG/2ML IJ SOLN: 25.0000 ug | INTRAMUSCULAR | Status: DC | PRN

## 2015-06-11 MED ORDER — NEOSTIGMINE METHYLSULFATE 10 MG/10ML IV SOLN
INTRAVENOUS | Status: AC
  Filled 2015-06-11: qty 1

## 2015-06-11 MED ORDER — OXYCODONE HCL 5 MG PO TABS
5.0000 mg | ORAL_TABLET | ORAL | Status: DC | PRN
  Administered 2015-06-11: 5 mg via ORAL
  Administered 2015-06-11: 10 mg via ORAL
  Administered 2015-06-11: 5 mg via ORAL
  Administered 2015-06-12 – 2015-06-13 (×9): 10 mg via ORAL
  Filled 2015-06-11 (×6): qty 2
  Filled 2015-06-11: qty 1
  Filled 2015-06-11 (×2): qty 2
  Filled 2015-06-11: qty 1
  Filled 2015-06-11 (×2): qty 2

## 2015-06-11 MED ORDER — PANTOPRAZOLE SODIUM 40 MG PO TBEC
40.0000 mg | DELAYED_RELEASE_TABLET | Freq: Every day | ORAL | Status: DC
  Administered 2015-06-12 – 2015-06-13 (×2): 40 mg via ORAL
  Filled 2015-06-11 (×2): qty 1

## 2015-06-11 MED ORDER — FENTANYL CITRATE (PF) 100 MCG/2ML IJ SOLN
INTRAMUSCULAR | Status: DC | PRN
Start: 2015-06-11 — End: 2015-06-11
  Administered 2015-06-11: 100 ug via INTRAVENOUS
  Administered 2015-06-11 (×2): 50 ug via INTRAVENOUS

## 2015-06-11 SURGICAL SUPPLY — 61 items
BAG DECANTER FOR FLEXI CONT (MISCELLANEOUS) ×3 IMPLANT
BAG ZIPLOCK 12X15 (MISCELLANEOUS) ×3 IMPLANT
BANDAGE ELASTIC 6 VELCRO ST LF (GAUZE/BANDAGES/DRESSINGS) ×3 IMPLANT
BANDAGE ESMARK 6X9 LF (GAUZE/BANDAGES/DRESSINGS) ×1 IMPLANT
BLADE SAG 18X100X1.27 (BLADE) ×3 IMPLANT
BLADE SAW SGTL 11.0X1.19X90.0M (BLADE) ×3 IMPLANT
BNDG ESMARK 6X9 LF (GAUZE/BANDAGES/DRESSINGS) ×3
BOWL SMART MIX CTS (DISPOSABLE) ×3 IMPLANT
CAP KNEE TOTAL 3 SIGMA ×3 IMPLANT
CEMENT HV SMART SET (Cement) ×6 IMPLANT
CLOSURE WOUND 1/2 X4 (GAUZE/BANDAGES/DRESSINGS) ×1
CUFF TOURN SGL QUICK 34 (TOURNIQUET CUFF) ×2
CUFF TRNQT CYL 34X4X40X1 (TOURNIQUET CUFF) ×1 IMPLANT
DECANTER SPIKE VIAL GLASS SM (MISCELLANEOUS) ×3 IMPLANT
DRAPE EXTREMITY T 121X128X90 (DRAPE) ×3 IMPLANT
DRAPE POUCH INSTRU U-SHP 10X18 (DRAPES) ×3 IMPLANT
DRAPE U-SHAPE 47X51 STRL (DRAPES) ×3 IMPLANT
DRSG ADAPTIC 3X8 NADH LF (GAUZE/BANDAGES/DRESSINGS) ×3 IMPLANT
DRSG PAD ABDOMINAL 8X10 ST (GAUZE/BANDAGES/DRESSINGS) IMPLANT
DURAPREP 26ML APPLICATOR (WOUND CARE) ×3 IMPLANT
ELECT REM PT RETURN 9FT ADLT (ELECTROSURGICAL) ×3
ELECTRODE REM PT RTRN 9FT ADLT (ELECTROSURGICAL) ×1 IMPLANT
EVACUATOR 1/8 PVC DRAIN (DRAIN) ×3 IMPLANT
FACESHIELD WRAPAROUND (MASK) ×15 IMPLANT
GAUZE SPONGE 4X4 12PLY STRL (GAUZE/BANDAGES/DRESSINGS) ×3 IMPLANT
GLOVE BIO SURGEON STRL SZ7.5 (GLOVE) IMPLANT
GLOVE BIO SURGEON STRL SZ8 (GLOVE) ×3 IMPLANT
GLOVE BIOGEL PI IND STRL 6.5 (GLOVE) IMPLANT
GLOVE BIOGEL PI IND STRL 8 (GLOVE) ×1 IMPLANT
GLOVE BIOGEL PI INDICATOR 6.5 (GLOVE)
GLOVE BIOGEL PI INDICATOR 8 (GLOVE) ×2
GLOVE SURG SS PI 6.5 STRL IVOR (GLOVE) IMPLANT
GOWN STRL REUS W/TWL LRG LVL3 (GOWN DISPOSABLE) ×3 IMPLANT
GOWN STRL REUS W/TWL XL LVL3 (GOWN DISPOSABLE) ×3 IMPLANT
HANDPIECE INTERPULSE COAX TIP (DISPOSABLE) ×2
IMMOBILIZER KNEE 20 (SOFTGOODS) ×3 IMPLANT
IMMOBILIZER KNEE 20 THIGH 36 (SOFTGOODS) IMPLANT
KIT BASIN OR (CUSTOM PROCEDURE TRAY) ×3 IMPLANT
MANIFOLD NEPTUNE II (INSTRUMENTS) ×3 IMPLANT
NDL SAFETY ECLIPSE 18X1.5 (NEEDLE) ×2 IMPLANT
NEEDLE HYPO 18GX1.5 SHARP (NEEDLE) ×4
NS IRRIG 1000ML POUR BTL (IV SOLUTION) ×3 IMPLANT
PACK TOTAL JOINT (CUSTOM PROCEDURE TRAY) ×3 IMPLANT
PADDING CAST COTTON 6X4 STRL (CAST SUPPLIES) ×3 IMPLANT
PEN SKIN MARKING BROAD (MISCELLANEOUS) ×3 IMPLANT
POSITIONER SURGICAL ARM (MISCELLANEOUS) ×3 IMPLANT
SET HNDPC FAN SPRY TIP SCT (DISPOSABLE) ×1 IMPLANT
STRIP CLOSURE SKIN 1/2X4 (GAUZE/BANDAGES/DRESSINGS) ×2 IMPLANT
SUCTION FRAZIER 12FR DISP (SUCTIONS) ×3 IMPLANT
SUT MNCRL AB 4-0 PS2 18 (SUTURE) ×3 IMPLANT
SUT VIC AB 2-0 CT1 27 (SUTURE) ×6
SUT VIC AB 2-0 CT1 TAPERPNT 27 (SUTURE) ×3 IMPLANT
SUT VLOC 180 0 24IN GS25 (SUTURE) ×3 IMPLANT
SYR 20CC LL (SYRINGE) ×3 IMPLANT
SYR 50ML LL SCALE MARK (SYRINGE) ×3 IMPLANT
TOWEL OR 17X26 10 PK STRL BLUE (TOWEL DISPOSABLE) ×3 IMPLANT
TOWEL OR NON WOVEN STRL DISP B (DISPOSABLE) ×3 IMPLANT
TRAY FOLEY W/METER SILVER 14FR (SET/KITS/TRAYS/PACK) ×3 IMPLANT
WATER STERILE IRR 1500ML POUR (IV SOLUTION) ×3 IMPLANT
WRAP KNEE MAXI GEL POST OP (GAUZE/BANDAGES/DRESSINGS) ×3 IMPLANT
YANKAUER SUCT BULB TIP 10FT TU (MISCELLANEOUS) ×3 IMPLANT

## 2015-06-11 NOTE — Interval H&P Note (Signed)
History and Physical Interval Note:  06/11/2015 8:38 AM  Bethany [name]  has presented today for surgery, with the diagnosis of OA LEFT KNEE  The various methods of treatment have been discussed with the patient and family. After consideration of risks, benefits and other options for treatment, the patient has consented to  Procedure(s): LEFT TOTAL KNEE ARTHROPLASTY (Left) as a surgical intervention .  The patient's history has been reviewed, patient examined, no change in status, stable for surgery.  I have reviewed the patient's chart and labs.  Questions were answered to the patient's satisfaction.     Gearlean Alf

## 2015-06-11 NOTE — Anesthesia Postprocedure Evaluation (Signed)
  Anesthesia Post-op Note  Patient: Bethany [name]  Procedure(s) Performed: Procedure(s) (LRB): LEFT TOTAL KNEE ARTHROPLASTY (Left)  Patient Location: PACU  Anesthesia Type: General  Level of Consciousness: awake and alert   Airway and Oxygen Therapy: Patient Spontanous Breathing  Post-op Pain: mild  Post-op Assessment: Post-op Vital signs reviewed, Patient's Cardiovascular Status Stable, Respiratory Function Stable, Patent Airway and No signs of Nausea or vomiting  Last Vitals:  Filed Vitals:   06/11/15 1145  BP: 125/72  Pulse: 63  Temp:   Resp: 12    Post-op Vital Signs: stable   Complications: No apparent anesthesia complications

## 2015-06-11 NOTE — Anesthesia Procedure Notes (Signed)
Procedure Name: Intubation Date/Time: 06/11/2015 9:30 AM Performed by: Lajuana Carry E Pre-anesthesia Checklist: Patient identified, Emergency Drugs available, Suction available and Patient being monitored Patient Re-evaluated:Patient Re-evaluated prior to inductionOxygen Delivery Method: Circle System Utilized Preoxygenation: Pre-oxygenation with 100% oxygen Intubation Type: IV induction Ventilation: Mask ventilation without difficulty Laryngoscope Size: Miller and 2 Grade View: Grade III Tube type: Oral Tube size: 7.0 mm Number of attempts: 1 Airway Equipment and Method: Bougie stylet Placement Confirmation: ETT inserted through vocal cords under direct vision,  positive ETCO2 and breath sounds checked- equal and bilateral Secured at: 21 cm Tube secured with: Tape Dental Injury: Teeth and Oropharynx as per pre-operative assessment

## 2015-06-11 NOTE — Plan of Care (Signed)
Problem: Consults Goal: Diagnosis- Total Joint Replacement Left total knee     

## 2015-06-11 NOTE — Op Note (Signed)
Pre-operative diagnosis- Osteoarthritis  Left knee(s)  Post-operative diagnosis- Osteoarthritis Left knee(s)  Procedure-  Left  Total Knee Arthroplasty  Surgeon- Dione Plover. Tammala Weider, MD  Assistant- Molli Barrows, PA-C   Anesthesia-  Spinal  EBL-* No blood loss amount entered *   Drains Hemovac  Tourniquet time-  Total Tourniquet Time Documented: Thigh (Left) - 34 minutes Total: Thigh (Left) - 34 minutes     Complications- None  Condition-PACU - hemodynamically stable.   Brief Clinical Note  Bethany [name] is a 64 y.o. year old female with end stage OA of her right knee with progressively worsening pain and dysfunction. She has constant pain, with activity and at rest and significant functional deficits with difficulties even with ADLs. She has had extensive non-op management including analgesics, injections of cortisone and viscosupplements, and home exercise program, but remains in significant pain with significant dysfunction.Radiographs show bone on bone arthritis medial and patellofemoral. She presents now for right Total Knee Arthroplasty.    Procedure in detail---   The patient is brought into the operating room and positioned supine on the operating table. After successful administration of  Spinal,   a tourniquet is placed high on the Right thigh(s) and the lower extremity is prepped and draped in the usual sterile fashion. Time out is performed by the operating team and then the  Right lower extremity is wrapped in Esmarch, knee flexed and the tourniquet inflated to 300 mmHg.       A midline incision is made with a ten blade through the subcutaneous tissue to the level of the extensor mechanism. A fresh blade is used to make a medial parapatellar arthrotomy. Soft tissue over the proximal medial tibia is subperiosteally elevated to the joint line with a knife and into the semimembranosus bursa with a Cobb elevator. Soft tissue over the proximal lateral tibia is elevated with  attention being paid to avoiding the patellar tendon on the tibial tubercle. The patella is everted, knee flexed 90 degrees and the ACL and PCL are removed. Findings are bone on bone medial and patellofemoral with large medial osteophytes.        The drill is used to create a starting hole in the distal femur and the canal is thoroughly irrigated with sterile saline to remove the fatty contents. The 5 degree Right  valgus alignment guide is placed into the femoral canal and the distal femoral cutting block is pinned to remove 10 mm off the distal femur. Resection is made with an oscillating saw.      The tibia is subluxed forward and the menisci are removed. The extramedullary alignment guide is placed referencing proximally at the medial aspect of the tibial tubercle and distally along the second metatarsal axis and tibial crest. The block is pinned to remove 83mm off the more deficient medial  side. Resection is made with an oscillating saw. Size 2.5is the most appropriate size for the tibia and the proximal tibia is prepared with the modular drill and keel punch for that size.      The femoral sizing guide is placed and size 3 is most appropriate. Rotation is marked off the epicondylar axis and confirmed by creating a rectangular flexion gap at 90 degrees. The size 3 cutting block is pinned in this rotation and the anterior, posterior and chamfer cuts are made with the oscillating saw. The intercondylar block is then placed and that cut is made.      Trial size 2.5 tibial component, trial size 3  posterior stabilized femur and a 10  mm posterior stabilized rotating platform insert trial is placed. Full extension is achieved with excellent varus/valgus and anterior/posterior balance throughout full range of motion. The patella is everted and thickness measured to be 22  mm. Free hand resection is taken to 12 mm, a 38 template is placed, lug holes are drilled, trial patella is placed, and it tracks normally.  Osteophytes are removed off the posterior femur with the trial in place. All trials are removed and the cut bone surfaces prepared with pulsatile lavage. Cement is mixed and once ready for implantation, the size 2.5 tibial implant, size  3 posterior stabilized femoral component, and the size 38 patella are cemented in place and the patella is held with the clamp. The trial insert is placed and the knee held in full extension. The Exparel (20 ml mixed with 30 ml saline) and .25% Bupivicaine, are injected into the extensor mechanism, posterior capsule, medial and lateral gutters and subcutaneous tissues.  All extruded cement is removed and once the cement is hard the permanent 10 mm posterior stabilized rotating platform insert is placed into the tibial tray.      The wound is copiously irrigated with saline solution and the extensor mechanism closed over a hemovac drain with #1 V-loc suture. The tourniquet is released for a total tourniquet time of 34  minutes. Flexion against gravity is 140 degrees and the patella tracks normally. Subcutaneous tissue is closed with 2.0 vicryl and subcuticular with running 4.0 Monocryl. The incision is cleaned and dried and steri-strips and a bulky sterile dressing are applied. The limb is placed into a knee immobilizer and the patient is awakened and transported to recovery in stable condition.      Please note that a surgical assistant was a medical necessity for this procedure in order to perform it in a safe and expeditious manner. Surgical assistant was necessary to retract the ligaments and vital neurovascular structures to prevent injury to them and also necessary for proper positioning of the limb to allow for anatomic placement of the prosthesis.   Dione Plover Bethany Campoverde, MD    06/11/2015, 10:34 AM

## 2015-06-11 NOTE — Anesthesia Preprocedure Evaluation (Addendum)
Anesthesia Evaluation  Patient identified by MRN, date of birth, ID band Patient awake    Reviewed: Allergy & Precautions, NPO status , Patient's Chart, lab work & pertinent test results  History of Anesthesia Complications Negative for: history of anesthetic complications  Airway Mallampati: III       Dental  (+) Dental Advisory Given   Pulmonary sleep apnea ,  breath sounds clear to auscultation  Pulmonary exam normal       Cardiovascular hypertension, Pt. on medications Normal cardiovascular examRhythm:Regular Rate:Normal     Neuro/Psych negative neurological ROS  negative psych ROS   GI/Hepatic Neg liver ROS, GERD-  Medicated and Controlled,  Endo/Other  negative endocrine ROS  Renal/GU negative Renal ROS  negative genitourinary   Musculoskeletal  (+) Arthritis -, Osteoarthritis,    Abdominal   Peds negative pediatric ROS (+)  Hematology negative hematology ROS (+)   Anesthesia Other Findings   Reproductive/Obstetrics negative OB ROS                            Anesthesia Physical Anesthesia Plan  ASA: III  Anesthesia Plan: General   Post-op Pain Management:    Induction: Intravenous  Airway Management Planned: Oral ETT  Additional Equipment:   Intra-op Plan:   Post-operative Plan:   Informed Consent: I have reviewed the patients History and Physical, chart, labs and discussed the procedure including the risks, benefits and alternatives for the proposed anesthesia with the patient or authorized representative who has indicated his/her understanding and acceptance.   Dental advisory given  Plan Discussed with: Anesthesiologist and CRNA  Anesthesia Plan Comments:        Anesthesia Quick Evaluation

## 2015-06-11 NOTE — Evaluation (Signed)
Physical Therapy Evaluation Patient Details Name: Bethany [name] MRN: [id number] DOB: [birth year] Today's Date: 06/11/2015   History of Present Illness  [name]; s/p [name] (10/14)  Clinical Impression  Pt s/p [name] presents with decreased L LE strength/ROM and post op pain limiting functional mobility.  Pt should progress well to dc home with family assist and HHPT follow up.   Follow Up Recommendations Home health PT    Equipment Recommendations  None recommended by PT    Recommendations for Other Services OT consult     Precautions / Restrictions Precautions Precautions: Knee;Fall Required Braces or Orthoses: Knee Immobilizer - Left Knee Immobilizer - Left: Discontinue once straight leg raise with < 10 degree lag Restrictions Weight Bearing Restrictions: No Other Position/Activity Restrictions: WBAT      Mobility  Bed Mobility Overal bed mobility: Needs Assistance Bed Mobility: Supine to Sit     Supine to sit: Min assist     General bed mobility comments: cues for sequence and use of R LE to self assist  Transfers Overall transfer level: Needs assistance Equipment used: Rolling walker (2 wheeled) Transfers: Sit to/from Stand Sit to Stand: Min assist         General transfer comment: cues for LE management and use of UEs to self assist  Ambulation/Gait Ambulation/Gait assistance: Min assist Ambulation Distance (Feet): 46 Feet Assistive device: Rolling walker (2 wheeled) Gait Pattern/deviations: Step-to pattern;Decreased step length - right;Decreased step length - left;Shuffle;Trunk flexed     General Gait Details: cues for sequence, posture and position from [employer]            Wheelchair Mobility    Modified Rankin (Stroke Patients Only)       Balance                                             Pertinent Vitals/Pain Pain Assessment: 0-10 Pain Score: 4  Pain Location: L knee Pain Descriptors / Indicators:  Aching;Sore Pain Intervention(s): Limited activity within patient's tolerance;Monitored during session;Premedicated before session;Ice applied    Home Living Family/patient expects to be discharged to:: Private residence Living Arrangements: Spouse/significant other Available Help at Discharge: Family Type of Home: House Home Access: Stairs to enter Entrance Stairs-Rails: None Entrance Stairs-Number of Steps: 1 Home Layout: One level Home Equipment: Environmental consultant - 2 wheels Additional Comments: Dtr and husband will be assisting    Prior Function Level of Independence: Independent               Hand Dominance        Extremity/Trunk Assessment   Upper Extremity Assessment: Overall WFL for tasks assessed           Lower Extremity Assessment: LLE deficits/detail      Cervical / Trunk Assessment: Normal  Communication   Communication: No difficulties  Cognition Arousal/Alertness: Awake/alert Behavior During Therapy: WFL for tasks assessed/performed Overall Cognitive Status: Within Functional Limits for tasks assessed                      General Comments      Exercises        Assessment/Plan    PT Assessment Patient needs continued PT services  PT Diagnosis Difficulty walking   PT Problem List Decreased strength;Decreased activity tolerance;Decreased range of motion;Decreased mobility;Decreased knowledge of use of DME;Pain  PT Treatment Interventions  DME instruction;Gait training;Stair training;Functional mobility training;Therapeutic activities;Therapeutic exercise;Patient/family education   PT Goals (Current goals can be found in the Care Plan section) Acute Rehab PT Goals Patient Stated Goal: Resume previous lifestyle with decreased pain PT Goal Formulation: With patient Time For Goal Achievement: 06/14/15 Potential to Achieve Goals: Good    Frequency 7X/week   Barriers to discharge        Co-evaluation               End of Session  Equipment Utilized During Treatment: Gait belt;Left knee immobilizer Activity Tolerance: Patient tolerated treatment well Patient left: in bed;with call bell/phone within reach Nurse Communication: Mobility status         Time: 3500-9381 PT Time Calculation (min) (ACUTE ONLY): 30 min   Charges:   PT Evaluation $Initial PT Evaluation Tier I: 1 Procedure PT Treatments $Gait Training: 8-22 mins   PT G Codes:        Bethany [name] June 13, 2015, 5:14 PM

## 2015-06-11 NOTE — Transfer of Care (Signed)
Immediate Anesthesia Transfer of Care Note  Patient: Bethany [name]  Procedure(s) Performed: Procedure(s): LEFT TOTAL KNEE ARTHROPLASTY (Left)  Patient Location: PACU  Anesthesia Type:General  Level of Consciousness:  sedated, patient cooperative and responds to stimulation  Airway & Oxygen Therapy:Patient Spontanous Breathing and Patient connected to face mask oxgen  Post-op Assessment:  Report given to PACU RN and Post -op Vital signs reviewed and stable  Post vital signs:  Reviewed and stable  Last Vitals: There were no vitals filed for this visit.  Complications: No apparent anesthesia complications

## 2015-06-12 ENCOUNTER — Encounter (HOSPITAL_COMMUNITY): Payer: Self-pay | Admitting: Orthopedic Surgery

## 2015-06-12 LAB — CBC
HCT: 31.1 % — ABNORMAL LOW (ref 36.0–46.0)
Hemoglobin: 10.4 g/dL — ABNORMAL LOW (ref 12.0–15.0)
MCH: 28.3 pg (ref 26.0–34.0)
MCHC: 33.4 g/dL (ref 30.0–36.0)
MCV: 84.7 fL (ref 78.0–100.0)
PLATELETS: 264 10*3/uL (ref 150–400)
RBC: 3.67 MIL/uL — ABNORMAL LOW (ref 3.87–5.11)
RDW: 14.4 % (ref 11.5–15.5)
WBC: 16.5 10*3/uL — ABNORMAL HIGH (ref 4.0–10.5)

## 2015-06-12 LAB — BASIC METABOLIC PANEL
Anion gap: 3 — ABNORMAL LOW (ref 5–15)
BUN: 13 mg/dL (ref 6–20)
CALCIUM: 8.5 mg/dL — AB (ref 8.9–10.3)
CO2: 26 mmol/L (ref 22–32)
CREATININE: 0.86 mg/dL (ref 0.44–1.00)
Chloride: 108 mmol/L (ref 101–111)
GFR calc non Af Amer: >60 mL/min (ref >60)
Glucose, Bld: 119 mg/dL — ABNORMAL HIGH (ref 65–99)
Potassium: 3.9 mmol/L (ref 3.5–5.1)
Sodium: 137 mmol/L (ref 135–145)

## 2015-06-12 MED ORDER — METHOCARBAMOL 500 MG PO TABS: 500.0000 mg | ORAL_TABLET | Freq: Four times a day (QID) | ORAL | Status: DC | PRN

## 2015-06-12 MED ORDER — TRAMADOL HCL 50 MG PO TABS: 50.0000 mg | ORAL_TABLET | Freq: Four times a day (QID) | ORAL | Status: DC | PRN

## 2015-06-12 MED ORDER — OXYCODONE HCL 5 MG PO TABS: 5.0000 mg | ORAL_TABLET | ORAL | Status: DC | PRN

## 2015-06-12 MED ORDER — RIVAROXABAN 10 MG PO TABS: 10.0000 mg | ORAL_TABLET | Freq: Every day | ORAL | Status: DC

## 2015-06-12 NOTE — Progress Notes (Signed)
Physical Therapy Treatment Patient Details Name: Bethany [name] MRN: [id number] DOB: [birth year] Today's Date: 06/12/2015    History of Present Illness [name]; s/p [name] (10/14)    PT Comments    POD # 1 am session.  Applied KI and assisted OOB to amb in hallway.  Assisted to BR then to recliner to perform TKR TE's followed by ICE.   Follow Up Recommendations  Home health PT     Equipment Recommendations  None recommended by PT    Recommendations for Other Services       Precautions / Restrictions Precautions Precautions: Knee;Fall Precaution Comments: instructed py on KI use for amb Required Braces or Orthoses: Knee Immobilizer - Left Knee Immobilizer - Left: Discontinue once straight leg raise with < 10 degree lag Restrictions Weight Bearing Restrictions: No Other Position/Activity Restrictions: WBAT    Mobility  Bed Mobility Overal bed mobility: Needs Assistance Bed Mobility: Supine to Sit     Supine to sit: Min assist     General bed mobility comments: cues for sequence and use of R LE to self assist  Transfers Overall transfer level: Needs assistance Equipment used: Rolling walker (2 wheeled) Transfers: Sit to/from Stand Sit to Stand: Min guard;Min assist         General transfer comment: cues for LE management and use of UEs to self assist  Ambulation/Gait Ambulation/Gait assistance: Min guard Ambulation Distance (Feet): 68 Feet Assistive device: Rolling walker (2 wheeled) Gait Pattern/deviations: Step-to pattern;Trunk flexed Gait velocity: decreased   General Gait Details: cues for sequence, posture and position from [employer]            Wheelchair Mobility    Modified Rankin (Stroke Patients Only)       Balance                                    Cognition Arousal/Alertness: Awake/alert Behavior During Therapy: WFL for tasks assessed/performed Overall Cognitive Status: Within Functional Limits for tasks  assessed                      Exercises   Total Knee Replacement TE's 10 reps B LE ankle pumps 10 reps towel squeezes 10 reps knee presses 10 reps heel slides  10 reps SAQ's 10 reps SLR's 10 reps ABD Followed by ICE     General Comments        Pertinent Vitals/Pain Pain Assessment: 0-10 Pain Score: 5  Pain Location: L knee Pain Descriptors / Indicators: Aching;Sore Pain Intervention(s): Monitored during session;Ice applied;Premedicated before session;Repositioned    Home Living                      Prior Function            PT Goals (current goals can now be found in the care plan section) Progress towards PT goals: Progressing toward goals    Frequency  7X/week    PT Plan      Co-evaluation             End of Session Equipment Utilized During Treatment: Gait belt;Left knee immobilizer Activity Tolerance: Patient tolerated treatment well Patient left: in chair;with call bell/phone within reach     Time: 5726-2035 PT Time Calculation (min) (ACUTE ONLY): 40 min  Charges:  $Gait Training: 8-22 mins $Therapeutic Exercise: 8-22 mins $Therapeutic Activity: 8-22 mins  G Codes:      Rica Koyanagi  PTA [facility]  Acute  Rehab Pager      [phone]

## 2015-06-12 NOTE — Discharge Instructions (Signed)
° °Dr. Frank Aluisio °Total Joint Specialist °[facility] °[address] °[place], [place] [address] °[phone] ° °TOTAL KNEE REPLACEMENT POSTOPERATIVE DIRECTIONS ° ° ° °Knee Rehabilitation, Guidelines Following Surgery  °Results after knee surgery are often greatly improved when you follow the exercise, range of motion and muscle strengthening exercises prescribed by your doctor. Safety measures are also important to protect the knee from further injury. Any time any of these exercises cause you to have increased pain or swelling in your knee joint, decrease the amount until you are comfortable again and slowly increase them. If you have problems or questions, call your caregiver or physical therapist for advice.  ° °HOME CARE INSTRUCTIONS  °Remove items at home which could result in a fall. This includes throw rugs or furniture in walking pathways.  °Continue medications as instructed at time of discharge. °You may have some home medications which will be placed on hold until you complete the course of blood thinner medication.  °You may start showering once you are discharged home but do not submerge the incision under water. Just pat the incision dry and apply a dry gauze dressing on daily. °Walk with walker as instructed.  °You may resume a sexual relationship in one month or when given the OK by  your doctor.  °· Use walker as long as suggested by your caregivers. °· Avoid periods of inactivity such as sitting longer than an hour when not asleep. This helps prevent blood clots.  °You may return to work once you are cleared by your doctor.  °Do not drive a car for 6 weeks or until released by you surgeon.  °· Do not drive while taking narcotics.  °Wear the elastic stockings for three weeks following surgery during the day but you may remove then at night. °Make sure you keep all of your appointments after your operation with all of your doctors and caregivers. You should call the  office at the above phone number and make an appointment for approximately two weeks after the date of your surgery. °Change the dressing daily and reapply a dry dressing each time. °Please pick up a stool softener and laxative for home use as long as you are requiring pain medications. °· ICE to the affected knee every three hours for 30 minutes at a time and then as needed for pain and swelling.  Continue to use ice on the knee for pain and swelling from surgery. You may notice swelling that will progress down to the foot and ankle.  This is normal after surgery.  Elevate the leg when you are not up walking on it.   °It is important for you to complete the blood thinner medication as prescribed by your doctor. °· Continue to use the breathing machine which will help keep your temperature down.  It is common for your temperature to cycle up and down following surgery, especially at night when you are not up moving around and exerting yourself.  The breathing machine keeps your lungs expanded and your temperature down. ° °RANGE OF MOTION AND STRENGTHENING EXERCISES  °Rehabilitation of the knee is important following a knee injury or an operation. After just a few days of immobilization, the muscles of the thigh which control the knee become weakened and shrink (atrophy). Knee exercises are designed to build up the tone and strength of the thigh muscles and to improve knee motion. Often times heat used for twenty to thirty minutes before working out will loosen up   your tissues and help with improving the range of motion but do not use heat for the first two weeks following surgery. These exercises can be done on a training (exercise) mat, on the floor, on a table or on a bed. Use what ever works the best and is most comfortable for you Knee exercises include:  Leg Lifts - While your knee is still immobilized in a splint or cast, you can do straight leg raises. Lift the leg to 60 degrees, hold for 3 sec, and slowly  lower the leg. Repeat 10-20 times 2-3 times daily. Perform this exercise against resistance later as your knee gets better.  Quad and Hamstring Sets - Tighten up the muscle on the front of the thigh (Quad) and hold for 5-10 sec. Repeat this 10-20 times hourly. Hamstring sets are done by pushing the foot backward against an object and holding for 5-10 sec. Repeat as with quad sets.  A rehabilitation program following serious knee injuries can speed recovery and prevent re-injury in the future due to weakened muscles. Contact your doctor or a physical therapist for more information on knee rehabilitation.   SKILLED REHAB INSTRUCTIONS: If the patient is transferred to a skilled rehab facility following release from the hospital, a list of the current medications will be sent to the facility for the patient to continue.  When discharged from the skilled rehab facility, please have the facility set up the patient's [place] prior to being released. Also, the skilled facility will be responsible for providing the patient with their medications at time of release from the facility to include their pain medication, the muscle relaxants, and their blood thinner medication. If the patient is still at the rehab facility at time of the two week follow up appointment, the skilled rehab facility will also need to assist the patient in arranging follow up appointment in our office and any transportation needs.  MAKE SURE YOU:  Understand these instructions.  Will watch your condition.  Will get help right away if you are not doing well or get worse.    Pick up stool softner and laxative for home use following surgery while on pain medications. Do not submerge incision under water. Please use good hand washing techniques while changing dressing each day. May shower starting three days after surgery. Please use a clean towel to pat the incision dry following showers. Continue to use ice for pain  and swelling after surgery. Do not use any lotions or creams on the incision until instructed by your surgeon.  Information on my medicine - XARELTO (Rivaroxaban)  This medication education was reviewed with me or my healthcare representative as part of my discharge preparation.  The pharmacist that spoke with me during my hospital stay was:  Minda Ditto, [facility]  Why was Xarelto prescribed for you? Xarelto was prescribed for you to reduce the risk of blood clots forming after orthopedic surgery. The medical term for these abnormal blood clots is venous thromboembolism (VTE).  What do you need to know about xarelto ? Take your Xarelto ONCE DAILY at the same time every day. You may take it either with or without food.  If you have difficulty swallowing the tablet whole, you may crush it and mix in applesauce just prior to taking your dose.  Take Xarelto exactly as prescribed by your doctor and DO NOT stop taking Xarelto without talking to the doctor who prescribed the medication.  Stopping without other VTE prevention medication  to take the place of Xarelto may increase your risk of developing a clot.  After discharge, you should have regular check-up appointments with your healthcare provider that is prescribing your Xarelto.    What do you do if you miss a dose? If you miss a dose, take it as soon as you remember on the same day then continue your regularly scheduled once daily regimen the next day. Do not take two doses of Xarelto on the same day.   Important Safety Information A possible side effect of Xarelto is bleeding. You should call your healthcare provider right away if you experience any of the following: ? Bleeding from an injury or your nose that does not stop. ? Unusual colored urine (red or dark brown) or unusual colored stools (red or black). ? Unusual bruising for unknown reasons. ? A serious fall or if you hit your head (even if there is no bleeding).  Some  medicines may interact with Xarelto and might increase your risk of bleeding while on Xarelto. To help avoid this, consult your healthcare provider or pharmacist prior to using any new prescription or non-prescription medications, including herbals, vitamins, non-steroidal anti-inflammatory drugs (NSAIDs) and supplements.  This website has more information on Xarelto: [email or web address]

## 2015-06-12 NOTE — Progress Notes (Signed)
   Subjective: 1 Day Post-Op Procedure(s) (LRB): LEFT TOTAL KNEE ARTHROPLASTY (Left) Patient reports pain as mild.   We will start therapy today.  Plan is to go Home after hospital stay.  Objective: Vital signs in last 24 hours: Temp:  [97.3 F (36.3 C)-99.4 F (37.4 C)] 98.4 F (36.9 C) (06/16 0552) Pulse Rate:  [41-80] 44 (06/16 0552) Resp:  [9-16] 16 (06/16 0552) BP: (102-136)/(53-83) 119/53 mmHg (06/16 0552) SpO2:  [95 %-100 %] 100 % (06/16 0552) Weight:  [106.595 kg (235 lb)] 106.595 kg (235 lb) (06/15 1230)  Intake/Output from previous day:  Intake/Output Summary (Last 24 hours) at 06/12/15 0657 Last data filed at 06/12/15 0654  Gross per 24 hour  Intake 4116.75 ml  Output   2050 ml  Net 2066.75 ml    Intake/Output this shift: Total I/O In: 1258 [P.O.:530; I.V.:678; IV Piggyback:50] Out: 900 [Urine:900]  Labs:  Recent Labs  06/12/15 0430  HGB 10.4*    Recent Labs  06/12/15 0430  WBC 16.5*  RBC 3.67*  HCT 31.1*  PLT 264    Recent Labs  06/12/15 0430  NA 137  K 3.9  CL 108  CO2 26  BUN 13  CREATININE 0.86  GLUCOSE 119*  CALCIUM 8.5*   No results for input(s): LABPT, INR in the last 72 hours.  EXAM General - Patient is Alert, Appropriate and Oriented Extremity - Neurologically intact Neurovascular intact No cellulitis present Compartment soft Dressing - dressing C/D/I Motor Function - intact, moving foot and toes well on exam.  Hemovac pulled without difficulty.  Past Medical History  Diagnosis Date  . Hypertension   . Hyperlipidemia   . Allergy 1997    knees  . Obesity   . GERD (gastroesophageal reflux disease)   . Seasonal allergies   . Arthritis   . Sleep apnea     cpap  . Pneumonia     1 month ago - rsolved    Assessment/Plan: 1 Day Post-Op Procedure(s) (LRB): LEFT TOTAL KNEE ARTHROPLASTY (Left) Principal Problem:   OA (osteoarthritis) of knee   Advance diet Up with therapy D/C IV fluids Plan for discharge  tomorrow  DVT Prophylaxis - Xarelto Weight-Bearing as tolerated to left leg   Nellene Courtois V 06/12/2015, 6:57 AM

## 2015-06-12 NOTE — Progress Notes (Signed)
Physical Therapy Treatment Patient Details Name: Bethany [name] MRN: [id number] DOB: [birth year] Today's Date: 06/12/2015    History of Present Illness [name]; s/p [name] (10/14)    PT Comments    POD # 1 pm session. Applied KI and assisted with amb in hallway, assisted to BR then back to bed. Pt progressing well and plans to D/C to home tomorrow.   Follow Up Recommendations  Home health PT     Equipment Recommendations  None recommended by PT    Recommendations for Other Services       Precautions / Restrictions Precautions Precautions: Knee;Fall Precaution Comments: instructed py on KI use for amb Required Braces or Orthoses: Knee Immobilizer - Left Knee Immobilizer - Left: Discontinue once straight leg raise with < 10 degree lag Restrictions Weight Bearing Restrictions: No Other Position/Activity Restrictions: WBAT    Mobility  Bed Mobility Overal bed mobility: Needs Assistance Bed Mobility: Sit to Supine     Supine to sit: Min assist     General bed mobility comments: assisted back to bed  Transfers Overall transfer level: Needs assistance Equipment used: Rolling walker (2 wheeled) Transfers: Sit to/from Stand Sit to Stand: Supervision;Min guard         General transfer comment: cues for LE management and use of UEs to self assist  Ambulation/Gait Ambulation/Gait assistance: Supervision;Min guard Ambulation Distance (Feet): 74 Feet Assistive device: Rolling walker (2 wheeled) Gait Pattern/deviations: Step-through pattern;Decreased stride length;Trunk flexed Gait velocity: decreased   General Gait Details: cues for sequence, posture and position from RW.  Increased alternating gait.   Stairs            Wheelchair Mobility    Modified Rankin (Stroke Patients Only)       Balance                                    Cognition Arousal/Alertness: Awake/alert Behavior During Therapy: WFL for tasks assessed/performed Overall  Cognitive Status: Within Functional Limits for tasks assessed                      Exercises      General Comments        Pertinent Vitals/Pain Pain Assessment: 0-10 Pain Score: 5  Pain Location: L knee Pain Descriptors / Indicators: Aching;Sore Pain Intervention(s): Monitored during session;Ice applied;Premedicated before session;Repositioned    Home Living                      Prior Function            PT Goals (current goals can now be found in the care plan section) Progress towards PT goals: Progressing toward goals    Frequency  7X/week    PT Plan      Co-evaluation             End of Session Equipment Utilized During Treatment: Gait belt;Left knee immobilizer Activity Tolerance: Patient tolerated treatment well Patient left: in bed;with call bell/phone within reach     Time: 1340-1405 PT Time Calculation (min) (ACUTE ONLY): 25 min  Charges:  $Gait Training: 8-22 mins $Therapeutic Activity: 8-22 mins                    G Codes:      Rica Koyanagi  PTA [facility]  Acute  Rehab Pager      [phone]

## 2015-06-12 NOTE — Care Management Note (Signed)
Case Management Note  Patient Details  Name: Deasha Clendenin MRN: [id number] Date of Birth: January 24, 1951  Subjective/Objective:                   LEFT TOTAL KNEE ARTHROPLASTY (Left) Action/Plan:  Discharge planning Expected Discharge Date:  06/13/15              Expected Discharge Plan:  [place]  In-House Referral:     Discharge planning Services  CM Consult  Post Acute Care Choice:  Home Health Choice offered to:  Patient  DME Arranged:    DME Agency:     HH Arranged:  PT HH Agency:  [facility]  Status of Service:  Completed, signed off  Medicare Important Message Given:    Date Medicare IM Given:    Medicare IM give by:    Date Additional Medicare IM Given:    Additional Medicare Important Message give by:     If discussed at [place] of Stay Meetings, dates discussed:    Additional Comments: CM met with pt in room to offer choice of home health agency.  Pt chooses [facility] to render HHPT.  Address and contact information verified by pt.  Referral given to [facility] rep, Tim (on unit).  No DME needed as pt has rolling walker, 3n1 and shower stool from previous knee surgery.  No other CM needs were communicated. Dellie Catholic, RN 06/12/2015, 12:30 PM

## 2015-06-12 NOTE — Progress Notes (Signed)
MD informed of pt low BP, pt not symptomatic.  No new orders.

## 2015-06-13 LAB — CBC
HCT: 31.7 % — ABNORMAL LOW (ref 36.0–46.0)
Hemoglobin: 10.5 g/dL — ABNORMAL LOW (ref 12.0–15.0)
MCH: 28.1 pg (ref 26.0–34.0)
MCHC: 33.1 g/dL (ref 30.0–36.0)
MCV: 84.8 fL (ref 78.0–100.0)
PLATELETS: 270 10*3/uL (ref 150–400)
RBC: 3.74 MIL/uL — ABNORMAL LOW (ref 3.87–5.11)
RDW: 14.4 % (ref 11.5–15.5)
WBC: 16.4 10*3/uL — AB (ref 4.0–10.5)

## 2015-06-13 LAB — BASIC METABOLIC PANEL
ANION GAP: 6 (ref 5–15)
BUN: 11 mg/dL (ref 6–20)
CO2: 27 mmol/L (ref 22–32)
Calcium: 9.2 mg/dL (ref 8.9–10.3)
Chloride: 106 mmol/L (ref 101–111)
Creatinine, Ser: 0.7 mg/dL (ref 0.44–1.00)
GFR calc Af Amer: >60 mL/min (ref >60)
GFR calc non Af Amer: >60 mL/min (ref >60)
Glucose, Bld: 110 mg/dL — ABNORMAL HIGH (ref 65–99)
Potassium: 3.6 mmol/L (ref 3.5–5.1)
SODIUM: 139 mmol/L (ref 135–145)

## 2015-06-13 NOTE — Evaluation (Signed)
Occupational Therapy One Time Evaluation Patient Details Name: Bethany [name] MRN: [id number] DOB: [birth year] Today's Date: 06/13/2015    History of Present Illness [name]; s/p [name] (10/14)   Clinical Impression   Pt doing well and supposed to d/c today. All education completed for OT. Family able to assist PRN at d/c including LB dressing.    Follow Up Recommendations  No OT follow up;Supervision/Assistance - 24 hour    Equipment Recommendations  None recommended by OT    Recommendations for Other Services       Precautions / Restrictions Precautions Precautions: Knee;Fall Required Braces or Orthoses: Knee Immobilizer - Left Knee Immobilizer - Left: Discontinue once straight leg raise with < 10 degree lag Restrictions LLE Weight Bearing: Weight bearing as tolerated      Mobility Bed Mobility               General bed mobility comments: in chair.  Transfers Overall transfer level: Needs assistance Equipment used: Rolling walker (2 wheeled) Transfers: Sit to/from Stand Sit to Stand: Min guard              Balance                                            ADL Overall ADL's : Needs assistance/impaired Eating/Feeding: Independent;Sitting   Grooming: Wash/dry hands;Standing;Min guard   Upper Body Bathing: Set up;Sitting   Lower Body Bathing: Minimal assistance;Sit to/from stand   Upper Body Dressing : Set up;Sitting   Lower Body Dressing: Moderate assistance;Sit to/from stand   Toilet Transfer: Min guard;Ambulation;BSC;RW   Toileting- Water quality scientist and Hygiene: Min guard;Sit to/from stand         General ADL Comments: Pt states she did not obtain AE from previous surgery and family will assist with LB self care. Pt donned skirt over her head today and pulled down. Educated pt on proper sequence for LB dressing if she wears underwear and pants/shorts and importance of KI to be on by the time she stands until she is  able to SLR. Pt plans to sponge bathe initially.      Vision     Perception     Praxis      Pertinent Vitals/Pain Pain Assessment: 0-10 Pain Score: 4  Pain Location: L knee Pain Descriptors / Indicators: Aching;Sore Pain Intervention(s): Repositioned;Ice applied     Hand Dominance     Extremity/Trunk Assessment Upper Extremity Assessment Upper Extremity Assessment: Overall WFL for tasks assessed           Communication Communication Communication: No difficulties   Cognition Arousal/Alertness: Awake/alert Behavior During Therapy: WFL for tasks assessed/performed Overall Cognitive Status: Within Functional Limits for tasks assessed                     General Comments       Exercises       Shoulder Instructions      Home Living Family/patient expects to be discharged to:: Private residence Living Arrangements: Spouse/significant other Available Help at Discharge: Family Type of Home: House Home Access: Stairs to enter Technical brewer of Steps: 1 Entrance Stairs-Rails: None Home Layout: One level     Bathroom Shower/Tub: Teacher, early years/pre: Standard     Home Equipment: Environmental consultant - 2 wheels;Shower seat;Bedside commode   Additional Comments: Dtr and husband will be assisting  Prior Functioning/Environment Level of Independence: Independent             OT Diagnosis: Generalized weakness   OT Problem List:     OT Treatment/Interventions:      OT Goals(Current goals can be found in the care plan section) Acute Rehab OT Goals Patient Stated Goal: return to independence OT Goal Formulation: With patient  OT Frequency:     Barriers to D/C:            Co-evaluation              End of Session Equipment Utilized During Treatment: Rolling walker  Activity Tolerance: Patient tolerated treatment well Patient left: in chair;with call bell/phone within reach   Time: 0900-0924 OT Time Calculation  (min): 24 min Charges:  OT General Charges $OT Visit: 1 Procedure OT Evaluation $Initial OT Evaluation Tier I: 1 Procedure OT Treatments $Therapeutic Activity: 8-22 mins G-Codes:    Jules Schick  [phone] 06/13/2015, 9:56 AM

## 2015-06-13 NOTE — Progress Notes (Signed)
RN reviewed discharge instructions with patient and family. All questions answered.  Paperwork and prescriptions given.   NT rolled patient down in wheelchair to family car.  

## 2015-06-13 NOTE — Progress Notes (Signed)
   Subjective: 2 Days Post-Op Procedure(s) (LRB): LEFT TOTAL KNEE ARTHROPLASTY (Left) Patient reports pain as mild.   Patient seen in rounds with Dr. Wynelle Link. Patient is well, and has had no acute complaints or problems. Pain under good control. No issues overnight.   Objective: Vital signs in last 24 hours: Temp:  [98.2 F (36.8 C)-99.6 F (37.6 C)] 99.6 F (37.6 C) (06/17 0516) Pulse Rate:  [48-63] 63 (06/17 0516) Resp:  [16-18] 18 (06/17 0516) BP: (107-151)/(62-82) 151/82 mmHg (06/17 0516) SpO2:  [96 %-100 %] 96 % (06/17 0516)  Intake/Output from previous day:  Intake/Output Summary (Last 24 hours) at 06/13/15 0813 Last data filed at 06/13/15 0654  Gross per 24 hour  Intake    782 ml  Output   2500 ml  Net  -1718 ml     Labs:  Recent Labs  06/12/15 0430 06/13/15 0442  HGB 10.4* 10.5*    Recent Labs  06/12/15 0430 06/13/15 0442  WBC 16.5* 16.4*  RBC 3.67* 3.74*  HCT 31.1* 31.7*  PLT 264 270    Recent Labs  06/12/15 0430 06/13/15 0442  NA 137 139  K 3.9 3.6  CL 108 106  CO2 26 27  BUN 13 11  CREATININE 0.86 0.70  GLUCOSE 119* 110*  CALCIUM 8.5* 9.2    EXAM General - Patient is Alert and Oriented Extremity - Neurologically intact Intact pulses distally Dorsiflexion/Plantar flexion intact Compartment soft Dressing/Incision - clean, dry, no drainage Motor Function - intact, moving foot and toes well on exam.   Past Medical History  Diagnosis Date  . Hypertension   . Hyperlipidemia   . Allergy 1997    knees  . Obesity   . GERD (gastroesophageal reflux disease)   . Seasonal allergies   . Arthritis   . Sleep apnea     cpap  . Pneumonia     1 month ago - rsolved    Assessment/Plan: 2 Days Post-Op Procedure(s) (LRB): LEFT TOTAL KNEE ARTHROPLASTY (Left) Principal Problem:   OA (osteoarthritis) of knee  Estimated body mass index is 39.11 kg/(m^2) as calculated from the following:   Height as of this encounter: 5\' 5"  (1.651 m).  Weight as of this encounter: 106.595 kg (235 lb). Advance diet Up with therapy Discharge home with home health  DVT Prophylaxis - Xarelto Weight-Bearing as tolerated   Continue PT today. Plan for DC home today with HHPT.   Ardeen Jourdain, PA-C Orthopaedic Surgery 06/13/2015, 8:13 AM

## 2015-06-13 NOTE — Progress Notes (Signed)
Physical Therapy Treatment Patient Details Name: Bethany [name] MRN: [id number] DOB: Apr 05, 1951 Today's Date: 06/13/2015    History of Present Illness [name]; s/p [name] (10/14)    PT Comments    POD # 2 assisted with amb to bathroom then in hallway.  Returned to room to perform all supine TKR TE's following handout.  Instructed on proper tech and freq as well as use of ICE.   Follow Up Recommendations  Home health PT     Equipment Recommendations  None recommended by PT    Recommendations for Other Services       Precautions / Restrictions Precautions Precautions: Knee;Fall Precaution Comments: instructed py on KI use for amb Required Braces or Orthoses: Knee Immobilizer - Left Knee Immobilizer - Left: Discontinue once straight leg raise with < 10 degree lag Restrictions Weight Bearing Restrictions: No LLE Weight Bearing: Weight bearing as tolerated    Mobility  Bed Mobility               General bed mobility comments: in chair.  Transfers Overall transfer level: Needs assistance Equipment used: Rolling walker (2 wheeled) Transfers: Sit to/from Stand Sit to Stand: Supervision         General transfer comment: good use of hands to steady self and increased time  Ambulation/Gait Ambulation/Gait assistance: Supervision;Min guard Ambulation Distance (Feet): 82 Feet Assistive device: Rolling walker (2 wheeled) Gait Pattern/deviations: Step-to pattern;Decreased stance time - left Gait velocity: decreased   General Gait Details: good safety cognition   Stairs Stairs:  (no stairs)          Wheelchair Mobility    Modified Rankin (Stroke Patients Only)       Balance                                    Cognition Arousal/Alertness: Awake/alert Behavior During Therapy: WFL for tasks assessed/performed Overall Cognitive Status: Within Functional Limits for tasks assessed                      Exercises   Total Knee  Replacement TE's 10 reps B LE ankle pumps 10 reps towel squeezes 10 reps knee presses 10 reps heel slides  10 reps SAQ's 10 reps SLR's 10 reps ABD Followed by ICE     General Comments        Pertinent Vitals/Pain Pain Assessment: 0-10 Pain Score: 5  Pain Location: L knee Pain Descriptors / Indicators: Aching;Sore Pain Intervention(s): Monitored during session;Premedicated before session;Repositioned;Ice applied    Home Living Family/patient expects to be discharged to:: Private residence Living Arrangements: Spouse/significant other Available Help at Discharge: Family Type of Home: House Home Access: Stairs to enter Entrance Stairs-Rails: None Home Layout: One level Home Equipment: Environmental consultant - 2 wheels;Shower seat;Bedside commode Additional Comments: Dtr and husband will be assisting    Prior Function Level of Independence: Independent          PT Goals (current goals can now be found in the care plan section) Acute Rehab PT Goals Patient Stated Goal: return to independence Progress towards PT goals: Progressing toward goals    Frequency  7X/week    PT Plan      Co-evaluation             End of Session Equipment Utilized During Treatment: Gait belt;Left knee immobilizer Activity Tolerance: Patient tolerated treatment well Patient left: in chair;with call bell/phone within  reach;with family/visitor present     Time: 1040-1105 PT Time Calculation (min) (ACUTE ONLY): 25 min  Charges:  $Gait Training: 8-22 mins $Therapeutic Exercise: 8-22 mins                    G Codes:      Rica Koyanagi  PTA [facility]  Acute  Rehab Pager      [phone]

## 2015-06-14 DIAGNOSIS — Z96653 Presence of artificial knee joint, bilateral: Secondary | ICD-10-CM | POA: Diagnosis not present

## 2015-06-14 DIAGNOSIS — M1990 Unspecified osteoarthritis, unspecified site: Secondary | ICD-10-CM | POA: Diagnosis not present

## 2015-06-14 DIAGNOSIS — Z471 Aftercare following joint replacement surgery: Secondary | ICD-10-CM | POA: Diagnosis not present

## 2015-06-14 DIAGNOSIS — I10 Essential (primary) hypertension: Secondary | ICD-10-CM | POA: Diagnosis not present

## 2015-06-14 DIAGNOSIS — H8110 Benign paroxysmal vertigo, unspecified ear: Secondary | ICD-10-CM | POA: Diagnosis not present

## 2015-06-14 DIAGNOSIS — E669 Obesity, unspecified: Secondary | ICD-10-CM | POA: Diagnosis not present

## 2015-06-16 NOTE — Discharge Summary (Signed)
Physician Discharge Summary   Patient ID: Bethany [name] MRN: [id number] DOB/AGE: 1951-12-19 64 y.o.  Admit date: 06/11/2015 Discharge date: 06/13/2015  Primary Diagnosis: Primary osteoarthritis, left knee  Admission Diagnoses:  Past Medical History  Diagnosis Date  . Hypertension   . Hyperlipidemia   . Allergy 1997    knees  . Obesity   . GERD (gastroesophageal reflux disease)   . Seasonal allergies   . Arthritis   . Sleep apnea     cpap  . Pneumonia     1 month ago - rsolved   Discharge Diagnoses:   Principal Problem:   OA (osteoarthritis) of knee  Estimated body mass index is 39.11 kg/(m^2) as calculated from the following:   Height as of this encounter: 5' 5" (1.651 m).   Weight as of this encounter: 106.595 kg (235 lb).  Procedure:  Procedure(s) (LRB): LEFT TOTAL KNEE ARTHROPLASTY (Left)   Consults: None  HPI: The patient is a 64 year old female and comes in over a year out from right total knee arthroplasty. The patient states that she is doing well at this time. control at this time and describe their pain as mild (having more issues with the "tendons" in the knee). They are currently on no medication for their pain. The patient is currently doing home exercise program. The patient feels that they are progressing well at this time. She is really pleased with how she is doing with her right knee. She said she has minimal discomfort. She is getting around and doing a lot more. The only thing holding her back is her left knee. She has documented bone on bone arthritis, medial and patellofemoral left knee. She has had injections without benefit. She is ready to go ahead and proceed with the left knee replacement as she is very happy and very satisfied with her right knee. They have been treated conservatively in the past for the above stated problem and despite conservative measures, they continue to have progressive pain and severe functional limitations and dysfunction.  They have failed non-operative management including home exercise, medications, and injections. It is felt that they would benefit from undergoing total joint replacement. Risks and benefits of the procedure have been discussed with the patient and they elect to proceed with surgery. There are no active contraindications to surgery such as ongoing infection or rapidly progressive neurological disease.  Laboratory Data: Admission on 06/11/2015, Discharged on 06/13/2015  Component Date Value Ref Range Status  . ABO/RH(D) 06/11/2015 O NEG   Final  . Antibody Screen 06/11/2015 NEG   Final  . Sample Expiration 06/11/2015 06/14/2015   Final  . Color, Urine 06/11/2015 YELLOW  YELLOW Final  . APPearance 06/11/2015 CLEAR  CLEAR Final  . Specific Gravity, Urine 06/11/2015 1.017  1.005 - 1.030 Final  . pH 06/11/2015 5.5  5.0 - 8.0 Final  . Glucose, UA 06/11/2015 NEGATIVE  NEGATIVE mg/dL Final  . Hgb urine dipstick 06/11/2015 NEGATIVE  NEGATIVE Final  . Bilirubin Urine 06/11/2015 NEGATIVE  NEGATIVE Final  . Ketones, ur 06/11/2015 NEGATIVE  NEGATIVE mg/dL Final  . Protein, ur 06/11/2015 NEGATIVE  NEGATIVE mg/dL Final  . Urobilinogen, UA 06/11/2015 0.2  0.0 - 1.0 mg/dL Final  . Nitrite 06/11/2015 NEGATIVE  NEGATIVE Final  . Leukocytes, UA 06/11/2015 NEGATIVE  NEGATIVE Final   MICROSCOPIC NOT DONE ON URINES WITH NEGATIVE PROTEIN, BLOOD, LEUKOCYTES, NITRITE, OR GLUCOSE <1000 mg/dL.  . WBC 06/12/2015 16.5* 4.0 - 10.5 K/uL Final  . RBC 06/12/2015 3.67*  3.87 - 5.11 MIL/uL Final  . Hemoglobin 06/12/2015 10.4* 12.0 - 15.0 g/dL Final  . HCT 06/12/2015 31.1* 36.0 - 46.0 % Final  . MCV 06/12/2015 84.7  78.0 - 100.0 fL Final  . MCH 06/12/2015 28.3  26.0 - 34.0 pg Final  . MCHC 06/12/2015 33.4  30.0 - 36.0 g/dL Final  . RDW 06/12/2015 14.4  11.5 - 15.5 % Final  . Platelets 06/12/2015 264  150 - 400 K/uL Final  . Sodium 06/12/2015 137  135 - 145 mmol/L Final  . Potassium 06/12/2015 3.9  3.5 - 5.1 mmol/L Final    . Chloride 06/12/2015 108  101 - 111 mmol/L Final  . CO2 06/12/2015 26  22 - 32 mmol/L Final  . Glucose, Bld 06/12/2015 119* 65 - 99 mg/dL Final  . BUN 06/12/2015 13  6 - 20 mg/dL Final  . Creatinine, Ser 06/12/2015 0.86  0.44 - 1.00 mg/dL Final  . Calcium 06/12/2015 8.5* 8.9 - 10.3 mg/dL Final  . GFR calc non Af Amer 06/12/2015 >60  >60 mL/min Final  . GFR calc Af Amer 06/12/2015 >60  >60 mL/min Final   Comment: (NOTE) The eGFR has been calculated using the CKD EPI equation. This calculation has not been validated in all clinical situations. eGFR's persistently <60 mL/min signify possible Chronic Kidney Disease.   . Anion gap 06/12/2015 3* 5 - 15 Final  . WBC 06/13/2015 16.4* 4.0 - 10.5 K/uL Final  . RBC 06/13/2015 3.74* 3.87 - 5.11 MIL/uL Final  . Hemoglobin 06/13/2015 10.5* 12.0 - 15.0 g/dL Final  . HCT 06/13/2015 31.7* 36.0 - 46.0 % Final  . MCV 06/13/2015 84.8  78.0 - 100.0 fL Final  . MCH 06/13/2015 28.1  26.0 - 34.0 pg Final  . MCHC 06/13/2015 33.1  30.0 - 36.0 g/dL Final  . RDW 06/13/2015 14.4  11.5 - 15.5 % Final  . Platelets 06/13/2015 270  150 - 400 K/uL Final  . Sodium 06/13/2015 139  135 - 145 mmol/L Final  . Potassium 06/13/2015 3.6  3.5 - 5.1 mmol/L Final  . Chloride 06/13/2015 106  101 - 111 mmol/L Final  . CO2 06/13/2015 27  22 - 32 mmol/L Final  . Glucose, Bld 06/13/2015 110* 65 - 99 mg/dL Final  . BUN 06/13/2015 11  6 - 20 mg/dL Final  . Creatinine, Ser 06/13/2015 0.70  0.44 - 1.00 mg/dL Final  . Calcium 06/13/2015 9.2  8.9 - 10.3 mg/dL Final  . GFR calc non Af Amer 06/13/2015 >60  >60 mL/min Final  . GFR calc Af Amer 06/13/2015 >60  >60 mL/min Final   Comment: (NOTE) The eGFR has been calculated using the CKD EPI equation. This calculation has not been validated in all clinical situations. eGFR's persistently <60 mL/min signify possible Chronic Kidney Disease.   Georgiann Hahn gap 06/13/2015 6  5 - 15 Final  Hospital Outpatient Visit on 05/14/2015  Component  Date Value Ref Range Status  . aPTT 05/14/2015 32  24 - 37 seconds Final   Performed at [facility]  . WBC 05/14/2015 9.4  4.0 - 10.5 K/uL Final  . RBC 05/14/2015 4.46  3.87 - 5.11 MIL/uL Final  . Hemoglobin 05/14/2015 12.5  12.0 - 15.0 g/dL Final  . HCT 05/14/2015 37.4  36.0 - 46.0 % Final  . MCV 05/14/2015 83.9  78.0 - 100.0 fL Final  . MCH 05/14/2015 28.0  26.0 - 34.0 pg Final  . MCHC 05/14/2015 33.4  30.0 - 36.0  g/dL Final  . RDW 05/14/2015 14.1  11.5 - 15.5 % Final  . Platelets 05/14/2015 268  150 - 400 K/uL Final  . Sodium 05/14/2015 141  135 - 145 mmol/L Final  . Potassium 05/14/2015 3.3* 3.5 - 5.1 mmol/L Final  . Chloride 05/14/2015 103  101 - 111 mmol/L Final  . CO2 05/14/2015 28  22 - 32 mmol/L Final  . Glucose, Bld 05/14/2015 118* 65 - 99 mg/dL Final  . BUN 05/14/2015 13  6 - 20 mg/dL Final  . Creatinine, Ser 05/14/2015 0.80  0.44 - 1.00 mg/dL Final  . Calcium 05/14/2015 9.6  8.9 - 10.3 mg/dL Final  . Total Protein 05/14/2015 7.7  6.5 - 8.1 g/dL Final  . Albumin 05/14/2015 3.9  3.5 - 5.0 g/dL Final  . AST 05/14/2015 28  15 - 41 U/L Final  . ALT 05/14/2015 19  14 - 54 U/L Final  . Alkaline Phosphatase 05/14/2015 84  38 - 126 U/L Final  . Total Bilirubin 05/14/2015 1.4* 0.3 - 1.2 mg/dL Final  . GFR calc non Af Amer 05/14/2015 >60  >60 mL/min Final  . GFR calc Af Amer 05/14/2015 >60  >60 mL/min Final   Comment: (NOTE) The eGFR has been calculated using the CKD EPI equation. This calculation has not been validated in all clinical situations. eGFR's persistently <60 mL/min signify possible Chronic Kidney Disease.   . Anion gap 05/14/2015 10  5 - 15 Final  . Prothrombin Time 05/14/2015 13.2  11.6 - 15.2 seconds Final  . INR 05/14/2015 0.99  0.00 - 1.49 Final   Performed at [facility]  . ABO/RH(D) 05/14/2015 O NEG   Final  . Antibody Screen 05/14/2015 NEG   Final  . Sample Expiration 05/14/2015 05/22/2015   Final  . Color, Urine 05/14/2015 YELLOW  YELLOW  Final  . APPearance 05/14/2015 CLOUDY* CLEAR Final  . Specific Gravity, Urine 05/14/2015 1.015  1.005 - 1.030 Final  . pH 05/14/2015 6.0  5.0 - 8.0 Final  . Glucose, UA 05/14/2015 NEGATIVE  NEGATIVE mg/dL Final  . Hgb urine dipstick 05/14/2015 NEGATIVE  NEGATIVE Final  . Bilirubin Urine 05/14/2015 NEGATIVE  NEGATIVE Final  . Ketones, ur 05/14/2015 NEGATIVE  NEGATIVE mg/dL Final  . Protein, ur 05/14/2015 NEGATIVE  NEGATIVE mg/dL Final  . Urobilinogen, UA 05/14/2015 0.2  0.0 - 1.0 mg/dL Final  . Nitrite 05/14/2015 NEGATIVE  NEGATIVE Final  . Leukocytes, UA 05/14/2015 NEGATIVE  NEGATIVE Final   MICROSCOPIC NOT DONE ON URINES WITH NEGATIVE PROTEIN, BLOOD, LEUKOCYTES, NITRITE, OR GLUCOSE <1000 mg/dL.  Marland Kitchen MRSA, PCR 05/14/2015 NEGATIVE  NEGATIVE Final  . Staphylococcus aureus 05/14/2015 NEGATIVE  NEGATIVE Final   Comment:        The Xpert SA Assay (FDA approved for NASAL specimens in patients over 44 years of age), is one component of a comprehensive surveillance program.  Test performance has been validated by [facility] for patients greater than or equal to 59 year old. It is not intended to diagnose infection nor to guide or monitor treatment.       Hospital Course: Bethany [name] is a 64 y.o. who was admitted to [facility]. They were brought to the operating room on 06/11/2015 and underwent Procedure(s): LEFT TOTAL KNEE ARTHROPLASTY.  Patient tolerated the procedure well and was later transferred to the recovery room and then to the orthopaedic floor for postoperative care.  They were given PO and IV analgesics for pain control following their surgery.  They  were given 24 hours of postoperative antibiotics of  Anti-infectives    Start     Dose/Rate Route Frequency Ordered Stop   06/11/15 1600  ceFAZolin (ANCEF) IVPB 2 g/50 mL premix     2 g 100 mL/hr over 30 Minutes Intravenous Every 6 hours 06/11/15 1238 06/11/15 2137   06/11/15 0724  ceFAZolin (ANCEF) IVPB 2 g/50 mL  premix     2 g 100 mL/hr over 30 Minutes Intravenous On call to O.R. 06/11/15 1779 06/11/15 0946     and started on DVT prophylaxis in the form of Xarelto.   PT and OT were ordered for total joint protocol.  Discharge planning consulted to help with postop disposition and equipment needs.  Patient had a fair night on the evening of surgery.  They started to get up OOB with therapy on day one. Hemovac drain was pulled without difficulty.  Continued to work with therapy into day two.  Dressing was changed on day two and the incision was clean and dry.  Incision was healing well.  Patient was seen in rounds and was ready to go home.   Diet: Cardiac diet Activity:WBAT Follow-up:in 2 weeks Disposition - Home Discharged Condition: stable   Discharge Instructions    Call MD / Call 911    Complete by:  As directed   If you experience chest pain or shortness of breath, CALL 911 and be transported to the hospital emergency room.  If you develope a fever above 101 F, pus (white drainage) or increased drainage or redness at the wound, or calf pain, call your surgeon's office.     Constipation Prevention    Complete by:  As directed   Drink plenty of fluids.  Prune juice may be helpful.  You may use a stool softener, such as Colace (over the counter) 100 mg twice a day.  Use MiraLax (over the counter) for constipation as needed.     Diet - low sodium heart healthy    Complete by:  As directed      Increase activity slowly as tolerated    Complete by:  As directed             Medication List    STOP taking these medications        aspirin 81 MG chewable tablet     B COMPLEX 100 TR Tbcr     celecoxib 200 MG capsule  Commonly known as:  CELEBREX     methylPREDNISolone 4 MG Tbpk tablet  Commonly known as:  MEDROL DOSEPAK     multivitamin with minerals Tabs tablet     REFRESH LIQUIGEL OP      TAKE these medications        acyclovir 200 MG capsule  Commonly known as:  ZOVIRAX  Take 1  capsule (200 mg total) by mouth daily as needed (takes once daily as needed for flareups).     losartan-hydrochlorothiazide 100-25 MG per tablet  Commonly known as:  HYZAAR  Take 1 tablet by mouth daily.     meclizine 25 MG tablet  Commonly known as:  ANTIVERT  Take 1 tablet (25 mg total) by mouth 3 (three) times daily as needed for dizziness.     methocarbamol 500 MG tablet  Commonly known as:  ROBAXIN  Take 1 tablet (500 mg total) by mouth every 6 (six) hours as needed for muscle spasms.     omeprazole 20 MG capsule  Commonly known as:  PRILOSEC  Take  1 capsule (20 mg total) by mouth daily.     oxyCODONE 5 MG immediate release tablet  Commonly known as:  Oxy IR/ROXICODONE  Take 1-2 tablets (5-10 mg total) by mouth every 3 (three) hours as needed for breakthrough pain.     pravastatin 40 MG tablet  Commonly known as:  PRAVACHOL  Take 1 tablet (40 mg total) by mouth every morning.     rivaroxaban 10 MG Tabs tablet  Commonly known as:  XARELTO  Take 1 tablet (10 mg total) by mouth daily with breakfast.     traMADol 50 MG tablet  Commonly known as:  ULTRAM  Take 1-2 tablets (50-100 mg total) by mouth every 6 (six) hours as needed for moderate pain.           Follow-up Information    Follow up with Gearlean Alf, MD. Schedule an appointment as soon as possible for a visit on 06/24/2015.   Specialty:  Orthopedic Surgery   Why:  Call [phone] Monday to make the appointment   Contact information:   [address] [place] [address] [phone]       Follow up with [facility].   Why:  home health physical therapy   Contact information:   [address] [place] [address] [phone]       Signed: Ardeen Jourdain, PA-C Orthopaedic Surgery 06/16/2015, 8:50 AM

## 2015-06-17 DIAGNOSIS — M1990 Unspecified osteoarthritis, unspecified site: Secondary | ICD-10-CM | POA: Diagnosis not present

## 2015-06-17 DIAGNOSIS — Z471 Aftercare following joint replacement surgery: Secondary | ICD-10-CM | POA: Diagnosis not present

## 2015-06-17 DIAGNOSIS — H8110 Benign paroxysmal vertigo, unspecified ear: Secondary | ICD-10-CM | POA: Diagnosis not present

## 2015-06-17 DIAGNOSIS — E669 Obesity, unspecified: Secondary | ICD-10-CM | POA: Diagnosis not present

## 2015-06-17 DIAGNOSIS — Z96653 Presence of artificial knee joint, bilateral: Secondary | ICD-10-CM | POA: Diagnosis not present

## 2015-06-17 DIAGNOSIS — I10 Essential (primary) hypertension: Secondary | ICD-10-CM | POA: Diagnosis not present

## 2015-06-18 DIAGNOSIS — M1990 Unspecified osteoarthritis, unspecified site: Secondary | ICD-10-CM | POA: Diagnosis not present

## 2015-06-18 DIAGNOSIS — I10 Essential (primary) hypertension: Secondary | ICD-10-CM | POA: Diagnosis not present

## 2015-06-18 DIAGNOSIS — H8110 Benign paroxysmal vertigo, unspecified ear: Secondary | ICD-10-CM | POA: Diagnosis not present

## 2015-06-18 DIAGNOSIS — E669 Obesity, unspecified: Secondary | ICD-10-CM | POA: Diagnosis not present

## 2015-06-18 DIAGNOSIS — Z96653 Presence of artificial knee joint, bilateral: Secondary | ICD-10-CM | POA: Diagnosis not present

## 2015-06-18 DIAGNOSIS — Z471 Aftercare following joint replacement surgery: Secondary | ICD-10-CM | POA: Diagnosis not present

## 2015-06-19 DIAGNOSIS — M1990 Unspecified osteoarthritis, unspecified site: Secondary | ICD-10-CM | POA: Diagnosis not present

## 2015-06-19 DIAGNOSIS — Z471 Aftercare following joint replacement surgery: Secondary | ICD-10-CM | POA: Diagnosis not present

## 2015-06-19 DIAGNOSIS — I10 Essential (primary) hypertension: Secondary | ICD-10-CM | POA: Diagnosis not present

## 2015-06-19 DIAGNOSIS — Z96653 Presence of artificial knee joint, bilateral: Secondary | ICD-10-CM | POA: Diagnosis not present

## 2015-06-19 DIAGNOSIS — E669 Obesity, unspecified: Secondary | ICD-10-CM | POA: Diagnosis not present

## 2015-06-19 DIAGNOSIS — H8110 Benign paroxysmal vertigo, unspecified ear: Secondary | ICD-10-CM | POA: Diagnosis not present

## 2015-06-21 DIAGNOSIS — I10 Essential (primary) hypertension: Secondary | ICD-10-CM | POA: Diagnosis not present

## 2015-06-21 DIAGNOSIS — Z471 Aftercare following joint replacement surgery: Secondary | ICD-10-CM | POA: Diagnosis not present

## 2015-06-21 DIAGNOSIS — M1990 Unspecified osteoarthritis, unspecified site: Secondary | ICD-10-CM | POA: Diagnosis not present

## 2015-06-21 DIAGNOSIS — Z96653 Presence of artificial knee joint, bilateral: Secondary | ICD-10-CM | POA: Diagnosis not present

## 2015-06-21 DIAGNOSIS — H8110 Benign paroxysmal vertigo, unspecified ear: Secondary | ICD-10-CM | POA: Diagnosis not present

## 2015-06-21 DIAGNOSIS — E669 Obesity, unspecified: Secondary | ICD-10-CM | POA: Diagnosis not present

## 2015-06-24 DIAGNOSIS — Z471 Aftercare following joint replacement surgery: Secondary | ICD-10-CM | POA: Diagnosis not present

## 2015-06-24 DIAGNOSIS — Z96652 Presence of left artificial knee joint: Secondary | ICD-10-CM | POA: Diagnosis not present

## 2015-06-25 ENCOUNTER — Ambulatory Visit: Payer: Medicare Other | Attending: Orthopedic Surgery | Admitting: Physical Therapy

## 2015-06-25 DIAGNOSIS — M25562 Pain in left knee: Secondary | ICD-10-CM

## 2015-06-25 DIAGNOSIS — R262 Difficulty in walking, not elsewhere classified: Secondary | ICD-10-CM

## 2015-06-25 DIAGNOSIS — M25662 Stiffness of left knee, not elsewhere classified: Secondary | ICD-10-CM | POA: Diagnosis not present

## 2015-06-25 DIAGNOSIS — R609 Edema, unspecified: Secondary | ICD-10-CM | POA: Diagnosis not present

## 2015-06-25 NOTE — Therapy (Signed)
[facility] [place], [place], [address] Phone: [phone]   Fax:  [phone]  Physical Therapy Evaluation  Patient Details  Name: Bethany [name] MRN: [id number] Date of Birth: 09-24-1951 Referring Provider:  Gaynelle Arabian, MD  Encounter Date: 06/25/2015      PT End of Session - 06/25/15 0946    Visit Number 1   Number of Visits 12   Date for PT Re-Evaluation 07/23/15   PT Start Time 0946   PT Stop Time 1034   PT Time Calculation (min) 48 min   Activity Tolerance Patient tolerated treatment well   Behavior During Therapy [facility] for tasks assessed/performed      Past Medical History  Diagnosis Date  . Hypertension   . Hyperlipidemia   . Allergy 1997    knees  . Obesity   . GERD (gastroesophageal reflux disease)   . Seasonal allergies   . Arthritis   . Sleep apnea     cpap  . Pneumonia     1 month ago - rsolved    Past Surgical History  Procedure Laterality Date  . Abdominal hysterectomy    . Tubal ligation    . Phlebectomy    . Total knee arthroplasty Right 10/22/2013    Procedure: RIGHT TOTAL KNEE ARTHROPLASTY;  Surgeon: Gearlean Alf, MD;  Location: [facility];  Service: Orthopedics;  Laterality: Right;  . Ganglion cyst excision      left  . Breast surgery      fibroid cysts removed  . Total knee arthroplasty Left 06/11/2015    Procedure: LEFT TOTAL KNEE ARTHROPLASTY;  Surgeon: Gaynelle Arabian, MD;  Location: [facility];  Service: Orthopedics;  Laterality: Left;    There were no vitals filed for this visit.  Visit Diagnosis:  Pain in left knee - Plan: PT plan of care cert/re-cert  Stiffness of left knee - Plan: PT plan of care cert/re-cert  Edema - Plan: PT plan of care cert/re-cert  Difficulty walking - Plan: PT plan of care cert/re-cert      Subjective Assessment - 06/25/15 0948    Subjective Patient is s/p Left TKR 06/11/15. She ambulates with RW. She c/o pain with bending knee mainly.    Patient  Stated Goals to walk and get rid of this pain!   Currently in Pain? Yes  high today, it's been a 2/10   Pain Score 4    Pain Location Knee   Pain Orientation Left   Pain Descriptors / Indicators Aching   Pain Type Surgical pain   Pain Radiating Towards up into thigh   Pain Onset 1 to 4 weeks ago   Pain Frequency Constant   Aggravating Factors  bending   Pain Relieving Factors meds, ice            [facility] PT Assessment - 06/25/15 0001    Assessment   Medical Diagnosis Lt TKR   Onset Date/Surgical Date 06/11/15   Next MD Visit 07/15/15   Prior Therapy HHPT x 5 days   Precautions   Precautions Knee   Restrictions   Weight Bearing Restrictions No   Balance Screen   Has the patient fallen in the past 6 months No   Has the patient had a decrease in activity level because of a fear of falling?  No   Is the patient reluctant to leave their home because of a fear of falling?  No   Home Ecologist residence  Available Help at Discharge --  lives with husband and daughter   Type of [place] to enter   Entrance Stairs-Number of Steps 1   Entrance Stairs-Rails None   Home Layout One level   Prior Function   Level of Independence Independent with household mobility with device   Observation/Other Assessments   Focus on Therapeutic Outcomes (FOTO)  76% limited   Observation/Other Assessments-Edema    Edema --  edema present   ROM / Strength   AROM / PROM / Strength AROM;Strength   AROM   AROM Assessment Site Knee   Right/Left Knee Left   Left Knee Extension -9  passive -6   Left Knee Flexion 65  68   Strength   Overall Strength Comments grossly 4-/5 in left hip, 5/5 Rt hip   Palpation   Palpation comment tender to left ITB and quad and med/lat knee                   [facility] Adult PT Treatment/Exercise - 06/25/15 0001    Exercises   Exercises Knee/Hip   Knee/Hip Exercises: Aerobic   Nustep L4 x 10 min for  ROM   Modalities   Modalities Cryotherapy;Electrical Stimulation   Cryotherapy   Number Minutes Cryotherapy 15 Minutes   Cryotherapy Location Knee   Type of Cryotherapy Ice pack   Electrical Stimulation   Electrical Stimulation Location Lt knee   Electrical Stimulation Action Premod x 15   Electrical Stimulation Parameters 80-150 hz   Electrical Stimulation Goals Pain                PT Education - 06/25/15 1008    Education provided Yes   Education Details continue with HHPT HEP, add s/l hip ABD and  prone ext   Person(s) Educated Patient   Methods Explanation   Comprehension Verbalized understanding;Returned demonstration          PT Short Term Goals - 06/25/15 1200    PT SHORT TERM GOAL #1   Title I with initial HEP   Time 2   Period Weeks   Status New   PT SHORT TERM GOAL #2   Title able to ambulate safely with SPC (07/09/15)   Time 2   Period Weeks   Status New   PT SHORT TERM GOAL #3   Title improved left knee ROM >=  -3 to 110 deg (07/23/15)   Time 4   Period Weeks   Status New           PT Long Term Goals - 06/25/15 1201    PT LONG TERM GOAL #1   Title I with advanced hep   Time 8   Period Weeks   Status New   PT LONG TERM GOAL #2   Title improved left knee ROM >=  -1 to 125 deg   Time 8   Period Weeks   Status New   PT LONG TERM GOAL #3   Title able to ambulate safely without AD   Time 8   Period Weeks   Status New   PT LONG TERM GOAL #4   Title demo 4+/5 or better LLE strength to improve function   Time 8   Period Weeks   Status New   PT LONG TERM GOAL #5   Title improve FOTO to CK   Time 8   Period Weeks   Status New  Plan - 07/12/2015 1154    Clinical Impression Statement Patient is 64 year old female s/p Left TKR 6/15/6. She had her right knee replaced in 2013/04/12. She has pain, decreased ROM and strength affecting gait. She uses a RW at this time. Incision is healing well and edema is present.    Pt will  benefit from skilled therapeutic intervention in order to improve on the following deficits Abnormal gait;Pain;Decreased activity tolerance;Decreased range of motion;Decreased strength;Increased edema   Rehab Potential Excellent   PT Frequency 3x / week   PT Duration 8 weeks   PT Treatment/Interventions ADLs/Self Care Home Management;Cryotherapy;Occupational psychologist;Therapeutic exercise;Balance training;Neuromuscular re-education;Manual techniques;Patient/family education;Passive range of motion;Vasopneumatic Device;Taping   PT Next Visit Plan ROM, strengthening, modalities for pain/edema   Consulted and Agree with Plan of Care Patient          G-Codes - 2015-07-12 Apr 13, 1207    Functional Assessment Tool Used FOTO =  76% limited   Functional Limitation Mobility: Walking and moving around   Mobility: Walking and Moving Around Current Status [phone]) At least 60 percent but less than 80 percent impaired, limited or restricted   Mobility: Walking and Moving Around Goal Status [phone]) At least 40 percent but less than 60 percent impaired, limited or restricted       Problem List Patient Active Problem List   Diagnosis Date Noted  . OSA (obstructive sleep apnea) 03/25/2014  . Chest pain 03/12/2014  . Abnormal ECG 03/12/2014  . OA (osteoarthritis) of knee 10/22/2013  . Obesity, unspecified 07/02/2013  . Arthritis 07/02/2013  . Genu varus 07/02/2013  . Mouth dryness 07/02/2013  . Benign paroxysmal positional vertigo 07/02/2013  . Unspecified vitamin D deficiency 07/02/2013  . Hypertension 05/04/2013  . Hyperlipidemia 05/04/2013    Madelyn Flavors PT  2015/07/12, 12:10 PM  [facility] [address] [place], [place], [address] Phone: [phone]   Fax:  [phone]

## 2015-06-25 NOTE — Patient Instructions (Signed)
ABDUCTION: Side-Lying (Active)   Lie on left side, top leg straight. Raise top leg as far as possible. Use ___ lbs. Complete 1-3__ sets of _10__ repetitions. Perform 1-2___ sessions per day.  [email or web address].[email or web address]   (Home) Extension: Hip   With support under abdomen, tighten stomach. Lift right leg in line with body. Do not hyperextend. Alternate legs. Repeat _10___ times per set. Do _1-3___ sets per session. Do _1-2___ sessions per day.  Madelyn Flavors, PT 06/25/2015 10:10 AM [facility] [place], [place], [address] Phone: [phone]   Fax:  [phone]

## 2015-06-27 ENCOUNTER — Encounter: Payer: Self-pay | Admitting: Physical Therapy

## 2015-06-27 ENCOUNTER — Ambulatory Visit: Payer: Medicare Other | Attending: Orthopedic Surgery | Admitting: Physical Therapy

## 2015-06-27 DIAGNOSIS — M25662 Stiffness of left knee, not elsewhere classified: Secondary | ICD-10-CM | POA: Insufficient documentation

## 2015-06-27 DIAGNOSIS — M25562 Pain in left knee: Secondary | ICD-10-CM | POA: Insufficient documentation

## 2015-06-27 DIAGNOSIS — R262 Difficulty in walking, not elsewhere classified: Secondary | ICD-10-CM | POA: Insufficient documentation

## 2015-06-27 DIAGNOSIS — R609 Edema, unspecified: Secondary | ICD-10-CM | POA: Diagnosis not present

## 2015-06-27 NOTE — Therapy (Signed)
[facility] [place], [place], [address] Phone: [phone]   Fax:  [phone]  Physical Therapy Treatment  Patient Details  Name: Bethany [name] MRN: [id number] Date of Birth: 1951/05/05 Referring Provider:  Claretta Fraise, MD  Encounter Date: 06/27/2015      PT End of Session - 06/27/15 0913    Visit Number 2   Number of Visits 12   Date for PT Re-Evaluation 07/23/15   PT Start Time 0820   PT Stop Time 0925   PT Time Calculation (min) 65 min      Past Medical History  Diagnosis Date  . Hypertension   . Hyperlipidemia   . Allergy 1997    knees  . Obesity   . GERD (gastroesophageal reflux disease)   . Seasonal allergies   . Arthritis   . Sleep apnea     cpap  . Pneumonia     1 month ago - rsolved    Past Surgical History  Procedure Laterality Date  . Abdominal hysterectomy    . Tubal ligation    . Phlebectomy    . Total knee arthroplasty Right 10/22/2013    Procedure: RIGHT TOTAL KNEE ARTHROPLASTY;  Surgeon: Gearlean Alf, MD;  Location: [facility];  Service: Orthopedics;  Laterality: Right;  . Ganglion cyst excision      left  . Breast surgery      fibroid cysts removed  . Total knee arthroplasty Left 06/11/2015    Procedure: LEFT TOTAL KNEE ARTHROPLASTY;  Surgeon: Gaynelle Arabian, MD;  Location: [facility];  Service: Orthopedics;  Laterality: Left;    There were no vitals filed for this visit.  Visit Diagnosis:  Pain in left knee      Subjective Assessment - 06/27/15 0829    Subjective Pt. states left knee is a little more painful today than yesterday.  Pt. states she has been performing home exercise program.   Patient is accompained by: Family member   Limitations Walking;House hold activities   How long can you sit comfortably? Pt. states she does not have significant difficulty sitting for prolonged periods of time.   How long can you stand comfortably? Pt. states she can stand for prolonged  periods of time.   How long can you walk comfortably? Pt. is not currently having difficulty with ambulation but is using rolling walker.   Patient Stated Goals Pt. would like to resume all functional activities without limitations in left knee.   Currently in Pain? Yes   Pain Score 5    Pain Location Knee   Pain Orientation Left   Pain Descriptors / Indicators Aching   Pain Type Surgical pain   Pain Onset 1 to 4 weeks ago   Pain Frequency Constant   Aggravating Factors  Left knee flexion; pain wakes her at night when pain meds expire.   Pain Relieving Factors medication   Effect of Pain on Daily Activities Pt. has limited functional activity   Multiple Pain Sites No                         [facility] Adult PT Treatment/Exercise - 06/27/15 0001    Exercises   Exercises Knee/Hip   Knee/Hip Exercises: Aerobic   Nustep L4 x 10 min for ROM   Knee/Hip Exercises: Supine   Quad Sets Left;2 sets;10 reps   Heel Slides Left;3 sets;15 reps   Straight Leg Raises Left;2 sets;10 reps   Patellar Mobs  left knee and scar mobilization   Knee Flexion PROM;Left;20 reps   Knee/Hip Exercises: Sidelying   Hip ABduction Left;2 sets;10 reps   Modalities   Modalities Electrical Stimulation;Vasopneumatic   Acupuncturist Location Lt knee   Electrical Stimulation Action premod 15 min   Electrical Stimulation Parameters 80-150 hz   Electrical Stimulation Goals Pain   Vasopneumatic   Number Minutes Vasopneumatic  15 minutes   Vasopnuematic Location  Knee   Vasopneumatic Pressure Low   Vasopneumatic Temperature  55/13   Manual Therapy   Manual Therapy Joint mobilization                  PT Short Term Goals - 06/25/15 1200    PT SHORT TERM GOAL #1   Title I with initial HEP   Time 2   Period Weeks   Status New   PT SHORT TERM GOAL #2   Title able to ambulate safely with SPC (07/09/15)   Time 2   Period Weeks   Status New   PT SHORT TERM  GOAL #3   Title improved left knee ROM >=  -3 to 110 deg (07/23/15)   Time 4   Period Weeks   Status New           PT Long Term Goals - 06/25/15 1201    PT LONG TERM GOAL #1   Title I with advanced hep   Time 8   Period Weeks   Status New   PT LONG TERM GOAL #2   Title improved left knee ROM >=  -1 to 125 deg   Time 8   Period Weeks   Status New   PT LONG TERM GOAL #3   Title able to ambulate safely without AD   Time 8   Period Weeks   Status New   PT LONG TERM GOAL #4   Title demo 4+/5 or better LLE strength to improve function   Time 8   Period Weeks   Status New   PT LONG TERM GOAL #5   Title improve FOTO to CK   Time 8   Period Weeks   Status New               Plan - 06/27/15 9326    Clinical Impression Statement Pt. tolerated therapy well today.  She will continue current home exercise plan.   Pt will benefit from skilled therapeutic intervention in order to improve on the following deficits Abnormal gait;Pain;Decreased activity tolerance;Decreased range of motion;Decreased strength;Increased edema   Rehab Potential Excellent   PT Frequency 3x / week   PT Duration 8 weeks   PT Treatment/Interventions ADLs/Self Care Home Management;Cryotherapy;Occupational psychologist;Therapeutic exercise;Balance training;Neuromuscular re-education;Manual techniques;Patient/family education;Passive range of motion;Vasopneumatic Device;Taping   PT Next Visit Plan ROM, strengthening, modalities for pain/edema; surgical date was 06/11/15   Consulted and Agree with Plan of Care Patient        Problem List Patient Active Problem List   Diagnosis Date Noted  . OSA (obstructive sleep apnea) 03/25/2014  . Chest pain 03/12/2014  . Abnormal ECG 03/12/2014  . OA (osteoarthritis) of knee 10/22/2013  . Obesity, unspecified 07/02/2013  . Arthritis 07/02/2013  . Genu varus 07/02/2013  . Mouth dryness 07/02/2013  . Benign paroxysmal positional  vertigo 07/02/2013  . Unspecified vitamin D deficiency 07/02/2013  . Hypertension 05/04/2013  . Hyperlipidemia 05/04/2013    Cherlyn Cushing PT 06/27/2015, 9:38 AM  [facility] [place], [place], [address] Phone: [phone]   Fax:  [phone]

## 2015-07-01 ENCOUNTER — Encounter: Payer: Self-pay | Admitting: Physical Therapy

## 2015-07-01 ENCOUNTER — Ambulatory Visit: Payer: Medicare Other | Admitting: Physical Therapy

## 2015-07-01 DIAGNOSIS — R262 Difficulty in walking, not elsewhere classified: Secondary | ICD-10-CM

## 2015-07-01 DIAGNOSIS — M25662 Stiffness of left knee, not elsewhere classified: Secondary | ICD-10-CM | POA: Diagnosis not present

## 2015-07-01 DIAGNOSIS — M25562 Pain in left knee: Secondary | ICD-10-CM

## 2015-07-01 DIAGNOSIS — R609 Edema, unspecified: Secondary | ICD-10-CM | POA: Diagnosis not present

## 2015-07-01 NOTE — Therapy (Signed)
[facility] [place], [place], [address] Phone: [phone]   Fax:  [phone]  Physical Therapy Treatment  Patient Details  Name: Bethany [name] MRN: [id number] Date of Birth: 1951/11/06 Referring Provider:  Claretta Fraise, MD  Encounter Date: 07/01/2015      PT End of Session - 07/01/15 0816    Visit Number 3   Number of Visits 12   Date for PT Re-Evaluation 07/23/15   PT Start Time 0728   PT Stop Time 0828   PT Time Calculation (min) 60 min   Activity Tolerance Patient tolerated treatment well   Behavior During Therapy [facility] for tasks assessed/performed      Past Medical History  Diagnosis Date  . Hypertension   . Hyperlipidemia   . Allergy 1997    knees  . Obesity   . GERD (gastroesophageal reflux disease)   . Seasonal allergies   . Arthritis   . Sleep apnea     cpap  . Pneumonia     1 month ago - rsolved    Past Surgical History  Procedure Laterality Date  . Abdominal hysterectomy    . Tubal ligation    . Phlebectomy    . Total knee arthroplasty Right 10/22/2013    Procedure: RIGHT TOTAL KNEE ARTHROPLASTY;  Surgeon: Gearlean Alf, MD;  Location: [facility];  Service: Orthopedics;  Laterality: Right;  . Ganglion cyst excision      left  . Breast surgery      fibroid cysts removed  . Total knee arthroplasty Left 06/11/2015    Procedure: LEFT TOTAL KNEE ARTHROPLASTY;  Surgeon: Gaynelle Arabian, MD;  Location: [facility];  Service: Orthopedics;  Laterality: Left;    There were no vitals filed for this visit.  Visit Diagnosis:  Pain in left knee  Stiffness of left knee  Edema  Difficulty walking      Subjective Assessment - 07/01/15 0747    Subjective had soreness after last treatment yet did very well   Limitations Walking;House hold activities   How long can you sit comfortably? Pt. states she does not have significant difficulty sitting for prolonged periods of time.   How long can you stand  comfortably? Pt. states she can stand for prolonged periods of time.   How long can you walk comfortably? Pt. is not currently having difficulty with ambulation but is using rolling walker.   Patient Stated Goals Pt. would like to resume all functional activities without limitations in left knee.   Currently in Pain? Yes   Pain Score 2    Pain Location Knee   Pain Orientation Left   Pain Descriptors / Indicators Aching;Sore   Pain Type Surgical pain   Pain Onset 1 to 4 weeks ago   Aggravating Factors  ROM and increased activity   Pain Relieving Factors meds and rest            [name] PT Assessment - 07/01/15 0001    ROM / Strength   AROM / PROM / Strength PROM   AROM   AROM Assessment Site Knee   Right/Left Knee Left   Left Knee Extension -8   Left Knee Flexion 83   PROM   Overall PROM  Deficits   PROM Assessment Site Knee   Right/Left Knee Left   Left Knee Extension -5   Left Knee Flexion 94  [facility] Adult PT Treatment/Exercise - 07/01/15 0001    Knee/Hip Exercises: Stretches   Knee: Self-Stretch to increase Flexion Left;3 reps;30 seconds   Knee/Hip Exercises: Aerobic   Nustep L4 x23mn   Knee/Hip Exercises: Standing   Rocker Board 3 minutes   Electrical Stimulation   Electrical Stimulation Location Lt knee   Electrical Stimulation Action premod   Electrical Stimulation Parameters 1-10hz    Electrical Stimulation Goals Pain   Vasopneumatic   Number Minutes Vasopneumatic  15 minutes   Vasopnuematic Location  Knee   Vasopneumatic Pressure Medium   Vasopneumatic Temperature  57   Manual Therapy   Manual Therapy Passive ROM   Passive ROM Low load holds for flexion and ext                PT Education - 07/01/15 0823    Education Details HEP- ROM self stretch   Person(s) Educated Patient   Methods Explanation;Demonstration;Handout   Comprehension Verbalized understanding;Returned demonstration          PT Short Term  Goals - 07/01/15 0820    PT SHORT TERM GOAL #1   Title I with initial HEP   Time 2   Period Weeks   Status Achieved   PT SHORT TERM GOAL #2   Title able to ambulate safely with SPC (07/09/15)   Time 2   Period Weeks   Status On-going   PT SHORT TERM GOAL #3   Title improved left knee ROM >=  -3 to 110 deg (07/23/15)   Time 4   Period Weeks   Status On-going           PT Long Term Goals - 07/01/15 05916   PT LONG TERM GOAL #1   Title I with advanced hep   Time 8   Period Weeks   Status On-going   PT LONG TERM GOAL #2   Title improved left knee ROM >=  -1 to 125 deg   Time 8   Period Weeks   Status On-going   PT LONG TERM GOAL #3   Title able to ambulate safely without AD   Time 8   Status On-going   PT LONG TERM GOAL #4   Title demo 4+/5 or better LLE strength to improve function   Time 8   Period Weeks   Status On-going   PT LONG TERM GOAL #5   Title improve FOTO to CK   Time 8   Period Weeks   Status On-going               Plan - 07/01/15 0817    Clinical Impression Statement Patient progressing with all activities. Patient presents with stiff left knee yet has improved with ROM today with both flexion and ext. Patient reported doing stretches at home several times a day. Patient also elevated and ice's knee after activity. STG #1 met others goals ongoing due to ROM, swelling and strength deficits.   Pt will benefit from skilled therapeutic intervention in order to improve on the following deficits Abnormal gait;Pain;Decreased activity tolerance;Decreased range of motion;Decreased strength;Increased edema   Rehab Potential Excellent   PT Frequency 3x / week   PT Duration 8 weeks   PT Treatment/Interventions ADLs/Self Care Home Management;Cryotherapy;EOccupational psychologistTherapeutic exercise;Balance training;Neuromuscular re-education;Manual techniques;Patient/family education;Passive range of motion;Vasopneumatic  Device;Taping   PT Next Visit Plan cont with POC (MD. AMaureen Ralphs7-19-16)   Consulted and Agree with Plan of Care Patient  Problem List Patient Active Problem List   Diagnosis Date Noted  . OSA (obstructive sleep apnea) 03/25/2014  . Chest pain 03/12/2014  . Abnormal ECG 03/12/2014  . OA (osteoarthritis) of knee 10/22/2013  . Obesity, unspecified 07/02/2013  . Arthritis 07/02/2013  . Genu varus 07/02/2013  . Mouth dryness 07/02/2013  . Benign paroxysmal positional vertigo 07/02/2013  . Unspecified vitamin D deficiency 07/02/2013  . Hypertension 05/04/2013  . Hyperlipidemia 05/04/2013    Zelpha Messing P, PTA 07/01/2015, 8:32 AM  [facility] [address] [place], [place], [address] Phone: [phone]   Fax:  [phone]

## 2015-07-01 NOTE — Patient Instructions (Signed)
Knee Extension Mobilization: Towel Prop   With rolled towel under right ankle, place _1-5___ pound weight across knee. Hold __5+__ minutes. Repeat __2-3__ times per set. Do __2__ sets per session. Do __2-4__ sessions per day.    Knee Flexion Stretch on Step  Place foot on step and lean forward until you feel a good stretch in front of knee.   hold 30 sec x 5-10 perform 2-4 x daily

## 2015-07-04 ENCOUNTER — Encounter: Payer: Self-pay | Admitting: Physical Therapy

## 2015-07-04 ENCOUNTER — Ambulatory Visit: Payer: Medicare Other | Admitting: Physical Therapy

## 2015-07-04 DIAGNOSIS — M25562 Pain in left knee: Secondary | ICD-10-CM

## 2015-07-04 DIAGNOSIS — M25662 Stiffness of left knee, not elsewhere classified: Secondary | ICD-10-CM

## 2015-07-04 DIAGNOSIS — R609 Edema, unspecified: Secondary | ICD-10-CM

## 2015-07-04 DIAGNOSIS — R262 Difficulty in walking, not elsewhere classified: Secondary | ICD-10-CM | POA: Diagnosis not present

## 2015-07-04 NOTE — Therapy (Signed)
[facility] [place], [place], [address] Phone: [phone]   Fax:  [phone]  Physical Therapy Treatment  Patient Details  Name: Bethany [name] MRN: [id number] Date of Birth: Jun 22, 1951 Referring Provider:  Claretta Fraise, MD  Encounter Date: 07/04/2015      PT End of Session - 07/04/15 0805    Visit Number 4   Number of Visits 12   Date for PT Re-Evaluation 07/23/15   PT Start Time 0729   PT Stop Time 0821   PT Time Calculation (min) 52 min   Activity Tolerance Patient tolerated treatment well;Patient limited by pain   Behavior During Therapy [facility] for tasks assessed/performed      Past Medical History  Diagnosis Date  . Hypertension   . Hyperlipidemia   . Allergy 1997    knees  . Obesity   . GERD (gastroesophageal reflux disease)   . Seasonal allergies   . Arthritis   . Sleep apnea     cpap  . Pneumonia     1 month ago - rsolved    Past Surgical History  Procedure Laterality Date  . Abdominal hysterectomy    . Tubal ligation    . Phlebectomy    . Total knee arthroplasty Right 10/22/2013    Procedure: RIGHT TOTAL KNEE ARTHROPLASTY;  Surgeon: Gearlean Alf, MD;  Location: [facility];  Service: Orthopedics;  Laterality: Right;  . Ganglion cyst excision      left  . Breast surgery      fibroid cysts removed  . Total knee arthroplasty Left 06/11/2015    Procedure: LEFT TOTAL KNEE ARTHROPLASTY;  Surgeon: Gaynelle Arabian, MD;  Location: [facility];  Service: Orthopedics;  Laterality: Left;    There were no vitals filed for this visit.  Visit Diagnosis:  Pain in left knee  Stiffness of left knee  Edema  Difficulty walking      Subjective Assessment - 07/04/15 0734    Subjective stiff sore and tender today in left knee today. Has a good day and stretched knee a lot yet very tender today.   Limitations Walking;House hold activities   How long can you sit comfortably? Pt. states she does not have  significant difficulty sitting for prolonged periods of time.   How long can you stand comfortably? Pt. states she can stand for prolonged periods of time.   How long can you walk comfortably? Patient is using [facility] today   Patient Stated Goals Pt. would like to resume all functional activities without limitations in left knee.   Currently in Pain? Yes   Pain Score 3    Pain Location Knee   Pain Orientation Left   Pain Type Surgical pain   Pain Onset 1 to 4 weeks ago   Pain Frequency Constant   Aggravating Factors  ROM and prolong activity   Pain Relieving Factors meds and rest            [facility] PT Assessment - 07/04/15 0001    ROM / Strength   AROM / PROM / Strength AROM;PROM   AROM   AROM Assessment Site Knee   Right/Left Knee Left   Left Knee Extension -8   Left Knee Flexion 80   PROM   Overall PROM  Deficits   PROM Assessment Site Knee   Right/Left Knee Left   Left Knee Extension -5   Left Knee Flexion 88  [facility] Adult PT Treatment/Exercise - 07/04/15 0001    Knee/Hip Exercises: Aerobic   Nustep L4 x68mn   EAcupuncturistLocation Lt knee   Electrical Stimulation Action premod   Electrical Stimulation Parameters 1-10HZ    Electrical Stimulation Goals Pain   Vasopneumatic   Number Minutes Vasopneumatic  15 minutes   Vasopnuematic Location  Knee   Vasopneumatic Pressure Medium   Manual Therapy   Manual Therapy Passive ROM   Passive ROM Low load holds for flexion and ext                  PT Short Term Goals - 07/01/15 0820    PT SHORT TERM GOAL #1   Title I with initial HEP   Time 2   Period Weeks   Status Achieved   PT SHORT TERM GOAL #2   Title able to ambulate safely with SPC (07/09/15)   Time 2   Period Weeks   Status On-going   PT SHORT TERM GOAL #3   Title improved left knee ROM >=  -3 to 110 deg (07/23/15)   Time 4   Period Weeks   Status On-going           PT Long  Term Goals - 07/01/15 08280   PT LONG TERM GOAL #1   Title I with advanced hep   Time 8   Period Weeks   Status On-going   PT LONG TERM GOAL #2   Title improved left knee ROM >=  -1 to 125 deg   Time 8   Period Weeks   Status On-going   PT LONG TERM GOAL #3   Title able to ambulate safely without AD   Time 8   Status On-going   PT LONG TERM GOAL #4   Title demo 4+/5 or better LLE strength to improve function   Time 8   Period Weeks   Status On-going   PT LONG TERM GOAL #5   Title improve FOTO to CK   Time 8   Period Weeks   Status On-going               Plan - 07/04/15 0808    Clinical Impression Statement Patient slowly progressing today due to increased swelling and pain in knee esp with ROM. Was unable to increase ROM and has a lot of tenderness in knee. Patient [facility]. No further goals met today due to edema, pain and ROM deficits.   Pt will benefit from skilled therapeutic intervention in order to improve on the following deficits Abnormal gait;Pain;Decreased activity tolerance;Decreased range of motion;Decreased strength;Increased edema   Rehab Potential Excellent   PT Frequency 3x / week   PT Duration 8 weeks   PT Treatment/Interventions ADLs/Self Care Home Management;Cryotherapy;EOccupational psychologistTherapeutic exercise;Balance training;Neuromuscular re-education;Manual techniques;Patient/family education;Passive range of motion;Vasopneumatic Device;Taping   PT Next Visit Plan cont with POC (MD. AMaureen Ralphs7-19-16)   Consulted and Agree with Plan of Care Patient        Problem List Patient Active Problem List   Diagnosis Date Noted  . OSA (obstructive sleep apnea) 03/25/2014  . Chest pain 03/12/2014  . Abnormal ECG 03/12/2014  . OA (osteoarthritis) of knee 10/22/2013  . Obesity, unspecified 07/02/2013  . Arthritis 07/02/2013  . Genu varus 07/02/2013  . Mouth  dryness 07/02/2013  . Benign paroxysmal positional vertigo 07/02/2013  . Unspecified vitamin D deficiency  07/02/2013  . Hypertension 05/04/2013  . Hyperlipidemia 05/04/2013    Kenyada Dosch P, PTA 07/04/2015, 8:23 AM  [facility] [address] [place], [place], [address] Phone: [phone]   Fax:  [phone]

## 2015-07-07 ENCOUNTER — Ambulatory Visit: Payer: Medicare Other | Admitting: Physical Therapy

## 2015-07-07 ENCOUNTER — Encounter: Payer: Self-pay | Admitting: Physical Therapy

## 2015-07-07 DIAGNOSIS — R609 Edema, unspecified: Secondary | ICD-10-CM

## 2015-07-07 DIAGNOSIS — R262 Difficulty in walking, not elsewhere classified: Secondary | ICD-10-CM

## 2015-07-07 DIAGNOSIS — M25562 Pain in left knee: Secondary | ICD-10-CM | POA: Diagnosis not present

## 2015-07-07 DIAGNOSIS — M25662 Stiffness of left knee, not elsewhere classified: Secondary | ICD-10-CM | POA: Diagnosis not present

## 2015-07-07 NOTE — Therapy (Signed)
[facility] [place], [place], [address] Phone: [phone]   Fax:  [phone]  Physical Therapy Treatment  Patient Details  Name: Bethany [name] MRN: [id number] Date of Birth: [birth year] Referring Provider:  Claretta Fraise, MD  Encounter Date: 07/07/2015      PT End of Session - 07/07/15 0811    Visit Number 5   Number of Visits 12   Date for PT Re-Evaluation 07/23/15   PT Start Time 0732   PT Stop Time 0827   PT Time Calculation (min) 55 min   Activity Tolerance Patient tolerated treatment well;Patient limited by pain   Behavior During Therapy [facility] for tasks assessed/performed      Past Medical History  Diagnosis Date  . Hypertension   . Hyperlipidemia   . Allergy 1997    knees  . Obesity   . GERD (gastroesophageal reflux disease)   . Seasonal allergies   . Arthritis   . Sleep apnea     cpap  . Pneumonia     1 month ago - rsolved    Past Surgical History  Procedure Laterality Date  . Abdominal hysterectomy    . Tubal ligation    . Phlebectomy    . Total knee arthroplasty Right 10/22/2013    Procedure: RIGHT TOTAL KNEE ARTHROPLASTY;  Surgeon: Gearlean Alf, MD;  Location: [facility];  Service: Orthopedics;  Laterality: Right;  . Ganglion cyst excision      left  . Breast surgery      fibroid cysts removed  . Total knee arthroplasty Left 06/11/2015    Procedure: LEFT TOTAL KNEE ARTHROPLASTY;  Surgeon: Gaynelle Arabian, MD;  Location: [facility];  Service: Orthopedics;  Laterality: Left;    There were no vitals filed for this visit.  Visit Diagnosis:  Stiffness of left knee  Pain in left knee  Edema  Difficulty walking      Subjective Assessment - 07/07/15 0736    Subjective Having a difficult time with knee due to soreness and stiffness   Limitations Walking;House hold activities   How long can you sit comfortably? Pt. states she does not have significant difficulty sitting for prolonged periods of  time.   How long can you stand comfortably? Pt. states she can stand for prolonged periods of time.   How long can you walk comfortably? Patient is using [facility] today   Patient Stated Goals Pt. would like to resume all functional activities without limitations in left knee.   Currently in Pain? Yes   Pain Score 3    Pain Location Knee   Pain Orientation Left   Pain Descriptors / Indicators Aching   Pain Type Surgical pain   Pain Onset 1 to 4 weeks ago   Pain Frequency Constant   Aggravating Factors  ROM   Pain Relieving Factors meds and rest            [facility] PT Assessment - 07/07/15 0001    AROM   Overall AROM  Deficits   AROM Assessment Site Knee   Right/Left Knee Left   Left Knee Extension -9   Left Knee Flexion 85   PROM   Overall PROM  Deficits   PROM Assessment Site Knee   Right/Left Knee Left   Left Knee Extension -5   Left Knee Flexion 95                     [facility] Adult PT Treatment/Exercise - 07/07/15 0001  Knee/Hip Exercises: Stretches   Knee: Self-Stretch to increase Flexion Left;3 reps;30 seconds   Knee/Hip Exercises: Aerobic   Nustep L4 x12min   Knee/Hip Exercises: Standing   Rocker Board 3 minutes   Electrical Stimulation   Electrical Stimulation Location Lt knee   Electrical Stimulation Action premod   Electrical Stimulation Parameters 1-10hz    Electrical Stimulation Goals Pain   Vasopneumatic   Number Minutes Vasopneumatic  15 minutes   Vasopnuematic Location  Knee   Vasopneumatic Pressure Medium   Manual Therapy   Manual Therapy Passive ROM   Passive ROM Low load holds for flexion and ext                  PT Short Term Goals - 07/01/15 0820    PT SHORT TERM GOAL #1   Title I with initial HEP   Time 2   Period Weeks   Status Achieved   PT SHORT TERM GOAL #2   Title able to ambulate safely with SPC (07/09/15)   Time 2   Period Weeks   Status On-going   PT SHORT TERM GOAL #3   Title improved left knee ROM >=  -3  to 110 deg (07/23/15)   Time 4   Period Weeks   Status On-going           PT Long Term Goals - 07/01/15 4142    PT LONG TERM GOAL #1   Title I with advanced hep   Time 8   Period Weeks   Status On-going   PT LONG TERM GOAL #2   Title improved left knee ROM >=  -1 to 125 deg   Time 8   Period Weeks   Status On-going   PT LONG TERM GOAL #3   Title able to ambulate safely without AD   Time 8   Status On-going   PT LONG TERM GOAL #4   Title demo 4+/5 or better LLE strength to improve function   Time 8   Period Weeks   Status On-going   PT LONG TERM GOAL #5   Title improve FOTO to CK   Time 8   Period Weeks   Status On-going               Plan - 07/07/15 3953    Clinical Impression Statement Patient is progressing today with improved active and passive ROM. Continues to report stiff and sore knee. Patient has edema and is wearing her compression stockings. Unable to meet any further goals due to edema, ROM and pain.   Pt will benefit from skilled therapeutic intervention in order to improve on the following deficits Abnormal gait;Pain;Decreased activity tolerance;Decreased range of motion;Decreased strength;Increased edema   Rehab Potential Excellent   PT Frequency 3x / week   PT Duration 8 weeks   PT Treatment/Interventions ADLs/Self Care Home Management;Cryotherapy;Occupational psychologist;Therapeutic exercise;Balance training;Neuromuscular re-education;Manual techniques;Patient/family education;Passive range of motion;Vasopneumatic Device;Taping   PT Next Visit Plan cont with POC (MD. Maureen Ralphs 07-15-15)   Consulted and Agree with Plan of Care Patient        Problem List Patient Active Problem List   Diagnosis Date Noted  . OSA (obstructive sleep apnea) 03/25/2014  . Chest pain 03/12/2014  . Abnormal ECG 03/12/2014  . OA (osteoarthritis) of knee 10/22/2013  . Obesity, unspecified 07/02/2013  . Arthritis 07/02/2013  . Genu varus  07/02/2013  . Mouth dryness 07/02/2013  . Benign paroxysmal positional vertigo 07/02/2013  . Unspecified vitamin D deficiency  07/02/2013  . Hypertension 05/04/2013  . Hyperlipidemia 05/04/2013    Phillips Climes, PTA 07/07/2015, 8:40 AM  [facility] [address] [place], [place], [address] Phone: [phone]   Fax:  [phone]

## 2015-07-09 ENCOUNTER — Ambulatory Visit: Payer: Medicare Other | Admitting: Physical Therapy

## 2015-07-09 ENCOUNTER — Encounter: Payer: Self-pay | Admitting: Physical Therapy

## 2015-07-09 DIAGNOSIS — M25662 Stiffness of left knee, not elsewhere classified: Secondary | ICD-10-CM | POA: Diagnosis not present

## 2015-07-09 DIAGNOSIS — R262 Difficulty in walking, not elsewhere classified: Secondary | ICD-10-CM

## 2015-07-09 DIAGNOSIS — R609 Edema, unspecified: Secondary | ICD-10-CM

## 2015-07-09 DIAGNOSIS — M25562 Pain in left knee: Secondary | ICD-10-CM | POA: Diagnosis not present

## 2015-07-09 NOTE — Therapy (Signed)
[facility] [place], [place], [address] Phone: [phone]   Fax:  [phone]  Physical Therapy Treatment  Patient Details  Name: Bethany [name] MRN: [id number] Date of Birth: 09-13-1951 Referring Provider:  Claretta Fraise, MD  Encounter Date: 07/09/2015      PT End of Session - 07/09/15 0943    Visit Number 6   Number of Visits 12   Date for PT Re-Evaluation 07/23/15   PT Start Time 0859   PT Stop Time 0958   PT Time Calculation (min) 59 min   Activity Tolerance Patient tolerated treatment well;Patient limited by pain   Behavior During Therapy [facility] for tasks assessed/performed      Past Medical History  Diagnosis Date  . Hypertension   . Hyperlipidemia   . Allergy 1997    knees  . Obesity   . GERD (gastroesophageal reflux disease)   . Seasonal allergies   . Arthritis   . Sleep apnea     cpap  . Pneumonia     1 month ago - rsolved    Past Surgical History  Procedure Laterality Date  . Abdominal hysterectomy    . Tubal ligation    . Phlebectomy    . Total knee arthroplasty Right 10/22/2013    Procedure: RIGHT TOTAL KNEE ARTHROPLASTY;  Surgeon: Gearlean Alf, MD;  Location: [facility];  Service: Orthopedics;  Laterality: Right;  . Ganglion cyst excision      left  . Breast surgery      fibroid cysts removed  . Total knee arthroplasty Left 06/11/2015    Procedure: LEFT TOTAL KNEE ARTHROPLASTY;  Surgeon: Gaynelle Arabian, MD;  Location: [facility];  Service: Orthopedics;  Laterality: Left;    There were no vitals filed for this visit.  Visit Diagnosis:  Stiffness of left knee  Pain in left knee  Edema  Difficulty walking      Subjective Assessment - 07/09/15 0907    Subjective feeling less pain yet stiff    Patient is accompained by: Family member   Limitations Walking;House hold activities   How long can you sit comfortably? Pt. states she does not have significant difficulty sitting for prolonged  periods of time.   How long can you stand comfortably? Pt. states she can stand for prolonged periods of time.   How long can you walk comfortably? Patient is using [facility] today   Patient Stated Goals Pt. would like to resume all functional activities without limitations in left knee.   Currently in Pain? Yes   Pain Score 3    Pain Location Knee   Pain Orientation Left   Pain Descriptors / Indicators Aching   Pain Type Surgical pain   Pain Onset 1 to 4 weeks ago   Pain Frequency Constant   Aggravating Factors  ROM   Pain Relieving Factors meds and rest            [facility] PT Assessment - 07/09/15 0001    AROM   Overall AROM  Deficits   AROM Assessment Site Knee   Right/Left Knee Left   Left Knee Extension -6   Left Knee Flexion 90   PROM   Overall PROM  Deficits   PROM Assessment Site Knee   Right/Left Knee Left   Left Knee Extension -4   Left Knee Flexion 100                     [facility] Adult PT Treatment/Exercise -  07/09/15 0001    Knee/Hip Exercises: Stretches   Knee: Self-Stretch to increase Flexion Left;3 reps;30 seconds   Knee/Hip Exercises: Aerobic   Nustep L4 x75min   Knee/Hip Exercises: Standing   Forward Step Up Left;2 sets;10 reps;Step Height: 6"   Rocker Board 3 minutes   Acupuncturist Location Lt knee   Electrical Stimulation Action premod   Electrical Stimulation Parameters 1-10HZ    Electrical Stimulation Goals Pain   Vasopneumatic   Number Minutes Vasopneumatic  15 minutes   Vasopnuematic Location  Knee   Vasopneumatic Pressure Medium   Manual Therapy   Manual Therapy Passive ROM   Passive ROM Low load holds for flexion and ext                  PT Short Term Goals - 07/01/15 0820    PT SHORT TERM GOAL #1   Title I with initial HEP   Time 2   Period Weeks   Status Achieved   PT SHORT TERM GOAL #2   Title able to ambulate safely with SPC (07/09/15)   Time 2   Period Weeks   Status  On-going   PT SHORT TERM GOAL #3   Title improved left knee ROM >=  -3 to 110 deg (07/23/15)   Time 4   Period Weeks   Status On-going           PT Long Term Goals - 07/01/15 8546    PT LONG TERM GOAL #1   Title I with advanced hep   Time 8   Period Weeks   Status On-going   PT LONG TERM GOAL #2   Title improved left knee ROM >=  -1 to 125 deg   Time 8   Period Weeks   Status On-going   PT LONG TERM GOAL #3   Title able to ambulate safely without AD   Time 8   Status On-going   PT LONG TERM GOAL #4   Title demo 4+/5 or better LLE strength to improve function   Time 8   Period Weeks   Status On-going   PT LONG TERM GOAL #5   Title improve FOTO to CK   Time 8   Period Weeks   Status On-going               Plan - 07/09/15 0943    Clinical Impression Statement Patient continues to progress with al activities. Improved ROM and increased endurance today. Patient continues to have stiffness from edema. Goals ongoing due to ROM and strength limitations.   Pt will benefit from skilled therapeutic intervention in order to improve on the following deficits Abnormal gait;Pain;Decreased activity tolerance;Decreased range of motion;Decreased strength;Increased edema   Rehab Potential Excellent   PT Frequency 3x / week   PT Duration 8 weeks   PT Treatment/Interventions ADLs/Self Care Home Management;Cryotherapy;Occupational psychologist;Therapeutic exercise;Balance training;Neuromuscular re-education;Manual techniques;Patient/family education;Passive range of motion;Vasopneumatic Device;Taping   PT Next Visit Plan cont with POC (MD. Maureen Ralphs 07-15-15)   Consulted and Agree with Plan of Care Patient        Problem List Patient Active Problem List   Diagnosis Date Noted  . OSA (obstructive sleep apnea) 03/25/2014  . Chest pain 03/12/2014  . Abnormal ECG 03/12/2014  . OA (osteoarthritis) of knee 10/22/2013  . Obesity, unspecified 07/02/2013  .  Arthritis 07/02/2013  . Genu varus 07/02/2013  . Mouth dryness 07/02/2013  . Benign paroxysmal positional vertigo 07/02/2013  .  Unspecified vitamin D deficiency 07/02/2013  . Hypertension 05/04/2013  . Hyperlipidemia 05/04/2013    Phillips Climes, PTA 07/09/2015, 10:15 AM  [facility] [place], [place], [address] Phone: [phone]   Fax:  [phone]

## 2015-07-11 ENCOUNTER — Encounter: Payer: Self-pay | Admitting: *Deleted

## 2015-07-11 ENCOUNTER — Ambulatory Visit: Payer: Medicare Other | Admitting: *Deleted

## 2015-07-11 DIAGNOSIS — R609 Edema, unspecified: Secondary | ICD-10-CM

## 2015-07-11 DIAGNOSIS — M25562 Pain in left knee: Secondary | ICD-10-CM | POA: Diagnosis not present

## 2015-07-11 DIAGNOSIS — R262 Difficulty in walking, not elsewhere classified: Secondary | ICD-10-CM

## 2015-07-11 DIAGNOSIS — M25662 Stiffness of left knee, not elsewhere classified: Secondary | ICD-10-CM | POA: Diagnosis not present

## 2015-07-11 NOTE — Therapy (Signed)
[facility] [place], [place], [address] Phone: [phone]   Fax:  [phone]  Physical Therapy Treatment  Patient Details  Name: Bethany [name] MRN: [id number] Date of Birth: 14-Apr-1951 Referring Provider:  Claretta Fraise, MD  Encounter Date: 07/11/2015      PT End of Session - 07/11/15 0832    Visit Number 7   Number of Visits 12   Date for PT Re-Evaluation 07/23/15   PT Start Time 0813   PT Stop Time 0913   PT Time Calculation (min) 60 min      Past Medical History  Diagnosis Date  . Hypertension   . Hyperlipidemia   . Allergy 1997    knees  . Obesity   . GERD (gastroesophageal reflux disease)   . Seasonal allergies   . Arthritis   . Sleep apnea     cpap  . Pneumonia     1 month ago - rsolved    Past Surgical History  Procedure Laterality Date  . Abdominal hysterectomy    . Tubal ligation    . Phlebectomy    . Total knee arthroplasty Right 10/22/2013    Procedure: RIGHT TOTAL KNEE ARTHROPLASTY;  Surgeon: Gearlean Alf, MD;  Location: [facility];  Service: Orthopedics;  Laterality: Right;  . Ganglion cyst excision      left  . Breast surgery      fibroid cysts removed  . Total knee arthroplasty Left 06/11/2015    Procedure: LEFT TOTAL KNEE ARTHROPLASTY;  Surgeon: Gaynelle Arabian, MD;  Location: [facility];  Service: Orthopedics;  Laterality: Left;    There were no vitals filed for this visit.  Visit Diagnosis:  Stiffness of left knee  Pain in left knee  Edema  Difficulty walking      Subjective Assessment - 07/11/15 0815    Subjective LT knee mainly feels sore. To MD on 07-15-15   Patient is accompained by: Family member   Limitations Walking;House hold activities   How long can you sit comfortably? Pt. states she does not have significant difficulty sitting for prolonged periods of time.   How long can you stand comfortably? Pt. states she can stand for prolonged periods of time.   How long  can you walk comfortably? Patient is using [facility] today   Patient Stated Goals Pt. would like to resume all functional activities without limitations in left knee.   Currently in Pain? Yes   Pain Score 2    Pain Location Knee   Pain Orientation Left   Pain Descriptors / Indicators Aching;Sore   Pain Type Surgical pain   Pain Radiating Towards up into thigh   Pain Onset 1 to 4 weeks ago   Pain Frequency Constant   Aggravating Factors  ROM   Pain Relieving Factors meds, rest, ice                         [facility] Adult PT Treatment/Exercise - 07/11/15 0001    Exercises   Exercises Knee/Hip   Knee/Hip Exercises: Aerobic   Nustep L4 x67min, seat progression 10,9,8 for ROM   Knee/Hip Exercises: Standing   Hip Flexion AROM;2 sets;10 reps   Forward Lunges Left;2 sets;10 reps   Forward Step Up Left;2 sets;10 reps;Step Height: 6"   Rocker Board 3 minutes   Rocker Board Limitations calf stretching/balance   Acupuncturist Location Lt knee   Electrical Stimulation Action premod1-10 hz x15  mins   Electrical Stimulation Goals Pain   Vasopneumatic   Number Minutes Vasopneumatic  15 minutes   Vasopnuematic Location  Knee   Vasopneumatic Pressure Medium   Vasopneumatic Temperature  57   Manual Therapy   Manual Therapy Passive ROM;Soft tissue mobilization   Soft tissue mobilization patella and scar mobs   Passive ROM Low load holds for flexion and ext with Pt supine                  PT Short Term Goals - 07/01/15 0820    PT SHORT TERM GOAL #1   Title I with initial HEP   Time 2   Period Weeks   Status Achieved   PT SHORT TERM GOAL #2   Title able to ambulate safely with SPC (07/09/15)   Time 2   Period Weeks   Status On-going   PT SHORT TERM GOAL #3   Title improved left knee ROM >=  -3 to 110 deg (07/23/15)   Time 4   Period Weeks   Status On-going           PT Long Term Goals - 07/01/15 6010    PT LONG TERM GOAL #1    Title I with advanced hep   Time 8   Period Weeks   Status On-going   PT LONG TERM GOAL #2   Title improved left knee ROM >=  -1 to 125 deg   Time 8   Period Weeks   Status On-going   PT LONG TERM GOAL #3   Title able to ambulate safely without AD   Time 8   Status On-going   PT LONG TERM GOAL #4   Title demo 4+/5 or better LLE strength to improve function   Time 8   Period Weeks   Status On-going   PT LONG TERM GOAL #5   Title improve FOTO to CK   Time 8   Period Weeks   Status On-going               Plan - 07/11/15 9323    Clinical Impression Statement Pt continues to do great and progress with ROM. SHE had 2-100 degrees of PROM today. She is unable to meet ROM goals yet due to tightness and others are ongoing   Pt will benefit from skilled therapeutic intervention in order to improve on the following deficits Abnormal gait;Pain;Decreased activity tolerance;Decreased range of motion;Decreased strength;Increased edema   Rehab Potential Excellent   PT Frequency 3x / week   PT Duration 8 weeks   PT Treatment/Interventions ADLs/Self Care Home Management;Cryotherapy;Occupational psychologist;Therapeutic exercise;Balance training;Neuromuscular re-education;Manual techniques;Patient/family education;Passive range of motion;Vasopneumatic Device;Taping   PT Next Visit Plan cont with POC (MD. Maureen Ralphs 07-15-15)        Problem List Patient Active Problem List   Diagnosis Date Noted  . OSA (obstructive sleep apnea) 03/25/2014  . Chest pain 03/12/2014  . Abnormal ECG 03/12/2014  . OA (osteoarthritis) of knee 10/22/2013  . Obesity, unspecified 07/02/2013  . Arthritis 07/02/2013  . Genu varus 07/02/2013  . Mouth dryness 07/02/2013  . Benign paroxysmal positional vertigo 07/02/2013  . Unspecified vitamin D deficiency 07/02/2013  . Hypertension 05/04/2013  . Hyperlipidemia 05/04/2013    RAMSEUR,CHRIS, PTA 07/11/2015, 9:48 AM  [facility] [address] [place], [place], [address] Phone: [phone]   Fax:  [phone]

## 2015-07-14 ENCOUNTER — Ambulatory Visit: Payer: Medicare Other | Admitting: Physical Therapy

## 2015-07-14 ENCOUNTER — Encounter: Payer: Self-pay | Admitting: Physical Therapy

## 2015-07-14 DIAGNOSIS — M25562 Pain in left knee: Secondary | ICD-10-CM

## 2015-07-14 DIAGNOSIS — R262 Difficulty in walking, not elsewhere classified: Secondary | ICD-10-CM | POA: Diagnosis not present

## 2015-07-14 DIAGNOSIS — R609 Edema, unspecified: Secondary | ICD-10-CM

## 2015-07-14 DIAGNOSIS — M25662 Stiffness of left knee, not elsewhere classified: Secondary | ICD-10-CM

## 2015-07-14 NOTE — Therapy (Signed)
[facility] [place], [place], [address] Phone: [phone]   Fax:  [phone]  Physical Therapy Treatment  Patient Details  Name: Bethany [name] MRN: [id number] Date of Birth: 1951/09/12 Referring Provider:  Claretta Fraise, MD  Encounter Date: 07/14/2015      PT End of Session - 07/14/15 1436    Visit Number 8   Number of Visits 12   Date for PT Re-Evaluation 07/23/15   PT Start Time 1430   PT Stop Time 1521   PT Time Calculation (min) 51 min   Activity Tolerance Patient tolerated treatment well;Patient limited by pain   Behavior During Therapy [facility] for tasks assessed/performed      Past Medical History  Diagnosis Date  . Hypertension   . Hyperlipidemia   . Allergy 1997    knees  . Obesity   . GERD (gastroesophageal reflux disease)   . Seasonal allergies   . Arthritis   . Sleep apnea     cpap  . Pneumonia     1 month ago - rsolved    Past Surgical History  Procedure Laterality Date  . Abdominal hysterectomy    . Tubal ligation    . Phlebectomy    . Total knee arthroplasty Right 10/22/2013    Procedure: RIGHT TOTAL KNEE ARTHROPLASTY;  Surgeon: Gearlean Alf, MD;  Location: [facility];  Service: Orthopedics;  Laterality: Right;  . Ganglion cyst excision      left  . Breast surgery      fibroid cysts removed  . Total knee arthroplasty Left 06/11/2015    Procedure: LEFT TOTAL KNEE ARTHROPLASTY;  Surgeon: Gaynelle Arabian, MD;  Location: [facility];  Service: Orthopedics;  Laterality: Left;    There were no vitals filed for this visit.  Visit Diagnosis:  Stiffness of left knee  Pain in left knee  Edema  Difficulty walking      Subjective Assessment - 07/14/15 1435    Subjective Reports more tenderness than pain today. Sees Dr. Maureen Ralphs tomorrow.   Limitations Walking;House hold activities   How long can you sit comfortably? Pt. states she does not have significant difficulty sitting for prolonged periods  of time.   How long can you stand comfortably? Pt. states she can stand for prolonged periods of time.   How long can you walk comfortably? Patient is using [facility] today   Patient Stated Goals Pt. would like to resume all functional activities without limitations in left knee.   Currently in Pain? Yes   Pain Score 1    Pain Location Knee   Pain Orientation Left;Lateral   Pain Descriptors / Indicators Tender   Pain Type Surgical pain   Pain Onset 1 to 4 weeks ago   Pain Frequency Constant            [facility] PT Assessment - 07/14/15 0001    Assessment   Medical Diagnosis Lt TKR   Onset Date/Surgical Date 06/11/15   Next MD Visit 07/15/15   Prior Therapy HHPT x 5 days   ROM / Strength   AROM / PROM / Strength AROM;PROM   AROM   Overall AROM  Deficits   AROM Assessment Site Knee   Right/Left Knee Left   Left Knee Extension -1   Left Knee Flexion 100   PROM   Overall PROM  Deficits   PROM Assessment Site Knee   Right/Left Knee Left   Left Knee Extension 0   Left Knee Flexion 106  [place] Adult PT Treatment/Exercise - 07/14/15 0001    Knee/Hip Exercises: Aerobic   Nustep x12 min, seat progression from seat 11 to 9   Knee/Hip Exercises: Standing   Forward Lunges Left;2 sets;10 reps;3 seconds  on 14" step   Forward Step Up Left;2 sets;10 reps;Step Height: 6"   Rocker Board 3 minutes   Modalities   Modalities Designer, multimedia Location L knee   Electrical Stimulation Action Pre-Mod   Electrical Stimulation Parameters 1-10 Hz x15 min   Electrical Stimulation Goals Pain   Vasopneumatic   Number Minutes Vasopneumatic  15 minutes   Vasopnuematic Location  Knee   Vasopneumatic Pressure Medium   Manual Therapy   Manual Therapy Passive ROM;Soft tissue mobilization   Soft tissue mobilization L patellar mobilzations in L/R, sup/inf, tilts   Passive ROM L knee into flex/ext with  gentle holds at end range                  PT Short Term Goals - 07/01/15 0820    PT SHORT TERM GOAL #1   Title I with initial HEP   Time 2   Period Weeks   Status Achieved   PT SHORT TERM GOAL #2   Title able to ambulate safely with SPC (07/09/15)   Time 2   Period Weeks   Status On-going   PT SHORT TERM GOAL #3   Title improved left knee ROM >=  -3 to 110 deg (07/23/15)   Time 4   Period Weeks   Status On-going           PT Long Term Goals - 07/01/15 9794    PT LONG TERM GOAL #1   Title I with advanced hep   Time 8   Period Weeks   Status On-going   PT LONG TERM GOAL #2   Title improved left knee ROM >=  -1 to 125 deg   Time 8   Period Weeks   Status On-going   PT LONG TERM GOAL #3   Title able to ambulate safely without AD   Time 8   Status On-going   PT LONG TERM GOAL #4   Title demo 4+/5 or better LLE strength to improve function   Time 8   Period Weeks   Status On-going   PT LONG TERM GOAL #5   Title improve FOTO to CK   Time 8   Period Weeks   Status On-going               Plan - 07/14/15 1507    Clinical Impression Statement Patient tolerated treatment very well today without complaint of increased pain. Completed exercises with moderate amount of verbal cueing and demonstration for correct technique and form. AROM and PROM of the L knee have improved as evidenced in today's note. Firm end feel noted during PROM of the L knee into flexion. Demonstrates good L patellar mobility in all directions. Normal modalites response noted following removal of the modalities. Denied pain following treatment.   Pt will benefit from skilled therapeutic intervention in order to improve on the following deficits Abnormal gait;Pain;Decreased activity tolerance;Decreased range of motion;Decreased strength;Increased edema   Rehab Potential Excellent   PT Frequency 3x / week   PT Duration 8 weeks   PT Treatment/Interventions ADLs/Self Care Home  Management;Cryotherapy;Occupational psychologist;Therapeutic exercise;Balance training;Neuromuscular re-education;Manual techniques;Patient/family education;Passive range of motion;Vasopneumatic Device;Taping   PT Next Visit Plan Continue per MPT  POC/ MD POC/ TKR protocol. Sees Dr. Maureen Ralphs 07/15/2015   Consulted and Agree with Plan of Care Patient        Problem List Patient Active Problem List   Diagnosis Date Noted  . OSA (obstructive sleep apnea) 03/25/2014  . Chest pain 03/12/2014  . Abnormal ECG 03/12/2014  . OA (osteoarthritis) of knee 10/22/2013  . Obesity, unspecified 07/02/2013  . Arthritis 07/02/2013  . Genu varus 07/02/2013  . Mouth dryness 07/02/2013  . Benign paroxysmal positional vertigo 07/02/2013  . Unspecified vitamin D deficiency 07/02/2013  . Hypertension 05/04/2013  . Hyperlipidemia 05/04/2013    Ahmed Prima, PTA 07/14/2015 3:49 PM  Mali Applegate MPT [facility] [address] [place], [place], [address] Phone: [phone]   Fax:  [phone]

## 2015-07-15 ENCOUNTER — Encounter: Payer: Medicare Other | Admitting: Physical Therapy

## 2015-07-15 DIAGNOSIS — Z471 Aftercare following joint replacement surgery: Secondary | ICD-10-CM | POA: Diagnosis not present

## 2015-07-15 DIAGNOSIS — Z96652 Presence of left artificial knee joint: Secondary | ICD-10-CM | POA: Diagnosis not present

## 2015-07-16 ENCOUNTER — Encounter: Payer: Self-pay | Admitting: Physical Therapy

## 2015-07-16 ENCOUNTER — Ambulatory Visit: Payer: Medicare Other | Admitting: Physical Therapy

## 2015-07-16 DIAGNOSIS — R262 Difficulty in walking, not elsewhere classified: Secondary | ICD-10-CM

## 2015-07-16 DIAGNOSIS — M25562 Pain in left knee: Secondary | ICD-10-CM | POA: Diagnosis not present

## 2015-07-16 DIAGNOSIS — R609 Edema, unspecified: Secondary | ICD-10-CM

## 2015-07-16 DIAGNOSIS — M25662 Stiffness of left knee, not elsewhere classified: Secondary | ICD-10-CM

## 2015-07-16 NOTE — Therapy (Signed)
[facility] [place], [place], [address] Phone: [phone]   Fax:  [phone]  Physical Therapy Treatment  Patient Details  Name: Bethany [name] MRN: [id number] Date of Birth: Sep 26, 1951 Referring Provider:  Claretta Fraise, MD  Encounter Date: 07/16/2015      PT End of Session - 07/16/15 0732    Visit Number 9   Number of Visits 12   Date for PT Re-Evaluation 07/23/15   PT Start Time 0729   PT Stop Time 0816   PT Time Calculation (min) 47 min   Activity Tolerance Patient tolerated treatment well   Behavior During Therapy [facility] for tasks assessed/performed      Past Medical History  Diagnosis Date  . Hypertension   . Hyperlipidemia   . Allergy 1997    knees  . Obesity   . GERD (gastroesophageal reflux disease)   . Seasonal allergies   . Arthritis   . Sleep apnea     cpap  . Pneumonia     1 month ago - rsolved    Past Surgical History  Procedure Laterality Date  . Abdominal hysterectomy    . Tubal ligation    . Phlebectomy    . Total knee arthroplasty Right 10/22/2013    Procedure: RIGHT TOTAL KNEE ARTHROPLASTY;  Surgeon: Gearlean Alf, MD;  Location: [facility];  Service: Orthopedics;  Laterality: Right;  . Ganglion cyst excision      left  . Breast surgery      fibroid cysts removed  . Total knee arthroplasty Left 06/11/2015    Procedure: LEFT TOTAL KNEE ARTHROPLASTY;  Surgeon: Gaynelle Arabian, MD;  Location: [facility];  Service: Orthopedics;  Laterality: Left;    There were no vitals filed for this visit.  Visit Diagnosis:  Stiffness of left knee  Pain in left knee  Edema  Difficulty walking      Subjective Assessment - 07/16/15 0742    Subjective Reports that Dr. Maureen Ralphs was pleased with progress and he wanted her to come to therapy for 4 more weeks. Continues to report lateral tenderness.   Limitations Walking;House hold activities   How long can you sit comfortably? Pt. states she does not have  significant difficulty sitting for prolonged periods of time.   How long can you stand comfortably? Pt. states she can stand for prolonged periods of time.   How long can you walk comfortably? Patient is using [facility] today   Patient Stated Goals Pt. would like to resume all functional activities without limitations in left knee.   Currently in Pain? Yes   Pain Score 1    Pain Orientation Left;Lateral   Pain Descriptors / Indicators Tender   Pain Type Surgical pain   Pain Onset 1 to 4 weeks ago            [facility] PT Assessment - 07/16/15 0001    Assessment   Medical Diagnosis Lt TKR   Onset Date/Surgical Date 06/11/15                     [facility] Adult PT Treatment/Exercise - 07/16/15 0001    Knee/Hip Exercises: Aerobic   Nustep L4, seat 9 x 12 min   Knee/Hip Exercises: Standing   Forward Lunges Left;2 sets;10 reps;3 seconds;Other (comment)  on 14" step   Lateral Step Up Left;2 sets;10 reps;Hand Hold: 2;Step Height: 6"   Forward Step Up Left;2 sets;10 reps;Hand Hold: 2;Step Height: 6"   Rocker Board 3  minutes   Modalities   Modalities Designer, multimedia Location L knee   Electrical Stimulation Action Pre-Mod   Electrical Stimulation Parameters 80-150 Hz x15 min   Electrical Stimulation Goals Pain   Vasopneumatic   Number Minutes Vasopneumatic  15 minutes   Vasopnuematic Location  Knee   Vasopneumatic Pressure Medium   Manual Therapy   Manual Therapy Passive ROM;Soft tissue mobilization   Soft tissue mobilization L patellar mobilzations in L/R, sup/inf, tilts   Passive ROM L knee into flex/ext with gentle holds at end range                  PT Short Term Goals - 07/01/15 0820    PT SHORT TERM GOAL #1   Title I with initial HEP   Time 2   Period Weeks   Status Achieved   PT SHORT TERM GOAL #2   Title able to ambulate safely with SPC (07/09/15)   Time 2   Period Weeks   Status  On-going   PT SHORT TERM GOAL #3   Title improved left knee ROM >=  -3 to 110 deg (07/23/15)   Time 4   Period Weeks   Status On-going           PT Long Term Goals - 07/01/15 7829    PT LONG TERM GOAL #1   Title I with advanced hep   Time 8   Period Weeks   Status On-going   PT LONG TERM GOAL #2   Title improved left knee ROM >=  -1 to 125 deg   Time 8   Period Weeks   Status On-going   PT LONG TERM GOAL #3   Title able to ambulate safely without AD   Time 8   Status On-going   PT LONG TERM GOAL #4   Title demo 4+/5 or better LLE strength to improve function   Time 8   Period Weeks   Status On-going   PT LONG TERM GOAL #5   Title improve FOTO to CK   Time 8   Period Weeks   Status On-going               Plan - 07/16/15 0805    Clinical Impression Statement Patient tolerated treatment well today without complaint of increased pain. Ambulated into therapy gym with [facility] and did not have TED hose donned. Completed exercises with minimal amount of verbal cueing and demonstration for correct technique and form. Firm end feel noted during PROM into flexion. Normal modalites response noted following removal of the modalites. Denied pain following treatment.   Pt will benefit from skilled therapeutic intervention in order to improve on the following deficits Abnormal gait;Pain;Decreased activity tolerance;Decreased range of motion;Decreased strength;Increased edema   Rehab Potential Excellent   PT Frequency 3x / week   PT Duration 8 weeks   PT Treatment/Interventions ADLs/Self Care Home Management;Cryotherapy;Occupational psychologist;Therapeutic exercise;Balance training;Neuromuscular re-education;Manual techniques;Patient/family education;Passive range of motion;Vasopneumatic Device;Taping   PT Next Visit Plan Continue per MPT POC/ MD POC/ TKR protocol.    Consulted and Agree with Plan of Care Patient        Problem List Patient Active  Problem List   Diagnosis Date Noted  . OSA (obstructive sleep apnea) 03/25/2014  . Chest pain 03/12/2014  . Abnormal ECG 03/12/2014  . OA (osteoarthritis) of knee 10/22/2013  . Obesity, unspecified 07/02/2013  . Arthritis 07/02/2013  . Genu varus  07/02/2013  . Mouth dryness 07/02/2013  . Benign paroxysmal positional vertigo 07/02/2013  . Unspecified vitamin D deficiency 07/02/2013  . Hypertension 05/04/2013  . Hyperlipidemia 05/04/2013    Wynelle Fanny, PTA 07/16/2015, 8:32 AM  [facility] [address] [place], [place], [address] Phone: [phone]   Fax:  [phone]

## 2015-07-18 ENCOUNTER — Encounter: Payer: Self-pay | Admitting: Physical Therapy

## 2015-07-18 ENCOUNTER — Ambulatory Visit: Payer: Medicare Other | Admitting: Physical Therapy

## 2015-07-18 DIAGNOSIS — R609 Edema, unspecified: Secondary | ICD-10-CM | POA: Diagnosis not present

## 2015-07-18 DIAGNOSIS — R262 Difficulty in walking, not elsewhere classified: Secondary | ICD-10-CM

## 2015-07-18 DIAGNOSIS — M25562 Pain in left knee: Secondary | ICD-10-CM

## 2015-07-18 DIAGNOSIS — M25662 Stiffness of left knee, not elsewhere classified: Secondary | ICD-10-CM | POA: Diagnosis not present

## 2015-07-18 NOTE — Therapy (Signed)
[facility] [place], [place], [address] Phone: [phone]   Fax:  [phone]  Physical Therapy Treatment  Patient Details  Name: Bethany [name] MRN: [id number] Date of Birth: 30-Aug-1951 Referring Provider:  Claretta Fraise, MD  Encounter Date: 07/18/2015      PT End of Session - 07/18/15 0834    Visit Number 10   Number of Visits 12   Date for PT Re-Evaluation 07/23/15   PT Start Time 0817   PT Stop Time 0910   PT Time Calculation (min) 53 min   Activity Tolerance Patient tolerated treatment well   Behavior During Therapy [facility] for tasks assessed/performed      Past Medical History  Diagnosis Date  . Hypertension   . Hyperlipidemia   . Allergy 1997    knees  . Obesity   . GERD (gastroesophageal reflux disease)   . Seasonal allergies   . Arthritis   . Sleep apnea     cpap  . Pneumonia     1 month ago - rsolved    Past Surgical History  Procedure Laterality Date  . Abdominal hysterectomy    . Tubal ligation    . Phlebectomy    . Total knee arthroplasty Right 10/22/2013    Procedure: RIGHT TOTAL KNEE ARTHROPLASTY;  Surgeon: Gearlean Alf, MD;  Location: [facility];  Service: Orthopedics;  Laterality: Right;  . Ganglion cyst excision      left  . Breast surgery      fibroid cysts removed  . Total knee arthroplasty Left 06/11/2015    Procedure: LEFT TOTAL KNEE ARTHROPLASTY;  Surgeon: Gaynelle Arabian, MD;  Location: [facility];  Service: Orthopedics;  Laterality: Left;    There were no vitals filed for this visit.  Visit Diagnosis:  Stiffness of left knee  Pain in left knee  Edema  Difficulty walking      Subjective Assessment - 07/18/15 0830    Subjective Reports that she felt good yesterday but woke up this morning with her knee swollen.   Limitations Walking;House hold activities   How long can you sit comfortably? Pt. states she does not have significant difficulty sitting for prolonged periods of  time.   How long can you stand comfortably? Pt. states she can stand for prolonged periods of time.   How long can you walk comfortably? Patient is using [facility] today   Patient Stated Goals Pt. would like to resume all functional activities without limitations in left knee.   Currently in Pain? Yes   Pain Score 2    Pain Location Knee   Pain Orientation Left   Pain Descriptors / Indicators Tender   Pain Type Surgical pain   Pain Onset 1 to 4 weeks ago            [facility] PT Assessment - 07/18/15 0001    Assessment   Medical Diagnosis Lt TKR   Onset Date/Surgical Date 06/11/15   Next MD Visit 07/2015   Prior Therapy HHPT x 5 days                     [facility] Adult PT Treatment/Exercise - 07/18/15 0001    Knee/Hip Exercises: Aerobic   Nustep L4 x12 min   Knee/Hip Exercises: Standing   Forward Lunges Left;2 sets;10 reps;3 seconds;Other (comment)  on 14" step   Forward Step Up Left;2 sets;10 reps;Hand Hold: 2;Step Height: 6"   Rocker Board 3 minutes   Modalities  Modalities Designer, multimedia Location L knee   Electrical Stimulation Action Pre-Mod   Electrical Stimulation Parameters 80-150 Hz x15 min   Electrical Stimulation Goals Pain   Vasopneumatic   Number Minutes Vasopneumatic  15 minutes   Vasopnuematic Location  Knee   Vasopneumatic Pressure Medium   Vasopneumatic Temperature  51   Manual Therapy   Manual Therapy Passive ROM   Passive ROM L knee into flex/ext with gentle holds at end range                  PT Short Term Goals - 07/01/15 0820    PT SHORT TERM GOAL #1   Title I with initial HEP   Time 2   Period Weeks   Status Achieved   PT SHORT TERM GOAL #2   Title able to ambulate safely with SPC (07/09/15)   Time 2   Period Weeks   Status On-going   PT SHORT TERM GOAL #3   Title improved left knee ROM >=  -3 to 110 deg (07/23/15)   Time 4   Period Weeks   Status  On-going           PT Long Term Goals - 07/01/15 1610    PT LONG TERM GOAL #1   Title I with advanced hep   Time 8   Period Weeks   Status On-going   PT LONG TERM GOAL #2   Title improved left knee ROM >=  -1 to 125 deg   Time 8   Period Weeks   Status On-going   PT LONG TERM GOAL #3   Title able to ambulate safely without AD   Time 8   Status On-going   PT LONG TERM GOAL #4   Title demo 4+/5 or better LLE strength to improve function   Time 8   Period Weeks   Status On-going   PT LONG TERM GOAL #5   Title improve FOTO to CK   Time 8   Period Weeks   Status On-going               Plan - 07/18/15 9604    Clinical Impression Statement Patient tolerated treatment well today although she had the tenderness and swelling. Had minimally increased swelling in the L knee today. Continues to ambulate into appointments with [facility] and no TED hose. Able to complete exercises with minimal verbal cueing and demonstration for technique. Firm end feels noted today during PROM of L knee. Normal modalities response noted following removal of the modalties.  Experienced 1-2/10 pain following treatment.   Pt will benefit from skilled therapeutic intervention in order to improve on the following deficits Abnormal gait;Pain;Decreased activity tolerance;Decreased range of motion;Decreased strength;Increased edema   Rehab Potential Excellent   PT Frequency 3x / week   PT Duration 8 weeks   PT Treatment/Interventions ADLs/Self Care Home Management;Cryotherapy;Occupational psychologist;Therapeutic exercise;Balance training;Neuromuscular re-education;Manual techniques;Patient/family education;Passive range of motion;Vasopneumatic Device;Taping   PT Next Visit Plan Continue per MPT POC/ MD POC/ TKR protocol.    Consulted and Agree with Plan of Care Patient        Problem List Patient Active Problem List   Diagnosis Date Noted  . OSA (obstructive sleep apnea)  03/25/2014  . Chest pain 03/12/2014  . Abnormal ECG 03/12/2014  . OA (osteoarthritis) of knee 10/22/2013  . Obesity, unspecified 07/02/2013  . Arthritis 07/02/2013  . Genu varus 07/02/2013  . Mouth  dryness 07/02/2013  . Benign paroxysmal positional vertigo 07/02/2013  . Unspecified vitamin D deficiency 07/02/2013  . Hypertension 05/04/2013  . Hyperlipidemia 05/04/2013    Ahmed Prima, PTA 07/18/2015 9:44 AM  [facility] [address] [place], [place], [address] Phone: [phone]   Fax:  [phone]

## 2015-07-21 ENCOUNTER — Encounter: Payer: Self-pay | Admitting: Physical Therapy

## 2015-07-21 ENCOUNTER — Ambulatory Visit: Payer: Medicare Other | Admitting: Physical Therapy

## 2015-07-21 DIAGNOSIS — R609 Edema, unspecified: Secondary | ICD-10-CM | POA: Diagnosis not present

## 2015-07-21 DIAGNOSIS — R262 Difficulty in walking, not elsewhere classified: Secondary | ICD-10-CM | POA: Diagnosis not present

## 2015-07-21 DIAGNOSIS — M25562 Pain in left knee: Secondary | ICD-10-CM

## 2015-07-21 DIAGNOSIS — M25662 Stiffness of left knee, not elsewhere classified: Secondary | ICD-10-CM

## 2015-07-21 NOTE — Therapy (Signed)
[facility] [place], [place], [address] Phone: [phone]   Fax:  [phone]  Physical Therapy Treatment  Patient Details  Name: Bethany [name] MRN: [id number] Date of Birth: [birth year] Referring Provider:  Claretta Fraise, MD  Encounter Date: 07/21/2015      PT End of Session - 07/21/15 0941    Visit Number 11   Number of Visits 12   Date for PT Re-Evaluation 07/23/15   PT Start Time 0937   PT Stop Time 0955   PT Time Calculation (min) 18 min   Activity Tolerance Patient tolerated treatment well   Behavior During Therapy [facility] for tasks assessed/performed      Past Medical History  Diagnosis Date  . Hypertension   . Hyperlipidemia   . Allergy 1997    knees  . Obesity   . GERD (gastroesophageal reflux disease)   . Seasonal allergies   . Arthritis   . Sleep apnea     cpap  . Pneumonia     1 month ago - rsolved    Past Surgical History  Procedure Laterality Date  . Abdominal hysterectomy    . Tubal ligation    . Phlebectomy    . Total knee arthroplasty Right 10/22/2013    Procedure: RIGHT TOTAL KNEE ARTHROPLASTY;  Surgeon: Gearlean Alf, MD;  Location: [facility];  Service: Orthopedics;  Laterality: Right;  . Ganglion cyst excision      left  . Breast surgery      fibroid cysts removed  . Total knee arthroplasty Left 06/11/2015    Procedure: LEFT TOTAL KNEE ARTHROPLASTY;  Surgeon: Gaynelle Arabian, MD;  Location: [facility];  Service: Orthopedics;  Laterality: Left;    There were no vitals filed for this visit.  Visit Diagnosis:  Stiffness of left knee  Pain in left knee  Edema  Difficulty walking      Subjective Assessment - 07/21/15 0940    Subjective Reports she has tenderness around midpatella to both sides.   Limitations Walking;House hold activities   How long can you sit comfortably? Pt. states she does not have significant difficulty sitting for prolonged periods of time.   How long can you  stand comfortably? Pt. states she can stand for prolonged periods of time.   How long can you walk comfortably? Patient is using [facility] today   Patient Stated Goals Pt. would like to resume all functional activities without limitations in left knee.   Currently in Pain? Yes   Pain Score 2    Pain Location Knee   Pain Orientation Left   Pain Descriptors / Indicators Tender   Pain Type Surgical pain   Pain Onset 1 to 4 weeks ago            [facility] PT Assessment - 07/21/15 0001    Assessment   Medical Diagnosis Lt TKR   Onset Date/Surgical Date 06/11/15   Next MD Visit 07/2015   Prior Therapy HHPT x 5 days                     [facility] Adult PT Treatment/Exercise - 07/21/15 0001    Knee/Hip Exercises: Aerobic   Nustep L5 x18 min                  PT Short Term Goals - 07/01/15 0820    PT SHORT TERM GOAL #1   Title I with initial HEP   Time 2   Period Weeks  Status Achieved   PT SHORT TERM GOAL #2   Title able to ambulate safely with SPC (07/09/15)   Time 2   Period Weeks   Status On-going   PT SHORT TERM GOAL #3   Title improved left knee ROM >=  -3 to 110 deg (07/23/15)   Time 4   Period Weeks   Status On-going           PT Long Term Goals - 07/01/15 3810    PT LONG TERM GOAL #1   Title I with advanced hep   Time 8   Period Weeks   Status On-going   PT LONG TERM GOAL #2   Title improved left knee ROM >=  -1 to 125 deg   Time 8   Period Weeks   Status On-going   PT LONG TERM GOAL #3   Title able to ambulate safely without AD   Time 8   Status On-going   PT LONG TERM GOAL #4   Title demo 4+/5 or better LLE strength to improve function   Time 8   Period Weeks   Status On-going   PT LONG TERM GOAL #5   Title improve FOTO to CK   Time 8   Period Weeks   Status On-going               Plan - 07/21/15 1751    Clinical Impression Statement Patient was 37 minutes late for treatment today but let facility know she would be late.  Tolerated NuStep okay but due to time that was all that was completed. Continues to ambulate with [facility] into appointments and no TED hose. Reported "feeling good" following NuStep.    Pt will benefit from skilled therapeutic intervention in order to improve on the following deficits Abnormal gait;Pain;Decreased activity tolerance;Decreased range of motion;Decreased strength;Increased edema   Rehab Potential Excellent   PT Frequency 3x / week   PT Duration 8 weeks   PT Treatment/Interventions ADLs/Self Care Home Management;Cryotherapy;Occupational psychologist;Therapeutic exercise;Balance training;Neuromuscular re-education;Manual techniques;Patient/family education;Passive range of motion;Vasopneumatic Device;Taping   PT Next Visit Plan Continue per MPT POC/ MD POC/ TKR protocol.    Consulted and Agree with Plan of Care Patient        Problem List Patient Active Problem List   Diagnosis Date Noted  . OSA (obstructive sleep apnea) 03/25/2014  . Chest pain 03/12/2014  . Abnormal ECG 03/12/2014  . OA (osteoarthritis) of knee 10/22/2013  . Obesity, unspecified 07/02/2013  . Arthritis 07/02/2013  . Genu varus 07/02/2013  . Mouth dryness 07/02/2013  . Benign paroxysmal positional vertigo 07/02/2013  . Unspecified vitamin D deficiency 07/02/2013  . Hypertension 05/04/2013  . Hyperlipidemia 05/04/2013    Wynelle Fanny, PTA 07/21/2015, 9:58 AM  [facility] [address] [place], [place], [address] Phone: [phone]   Fax:  [phone]

## 2015-07-23 ENCOUNTER — Encounter: Payer: Self-pay | Admitting: Physical Therapy

## 2015-07-23 ENCOUNTER — Ambulatory Visit: Payer: Medicare Other | Admitting: Physical Therapy

## 2015-07-23 DIAGNOSIS — R609 Edema, unspecified: Secondary | ICD-10-CM

## 2015-07-23 DIAGNOSIS — M25662 Stiffness of left knee, not elsewhere classified: Secondary | ICD-10-CM | POA: Diagnosis not present

## 2015-07-23 DIAGNOSIS — R262 Difficulty in walking, not elsewhere classified: Secondary | ICD-10-CM

## 2015-07-23 DIAGNOSIS — M25562 Pain in left knee: Secondary | ICD-10-CM | POA: Diagnosis not present

## 2015-07-23 NOTE — Therapy (Signed)
[facility] [place], [place], [address] Phone: [phone]   Fax:  [phone]  Physical Therapy Treatment  Patient Details  Name: Bethany [name] MRN: [id number] Date of Birth: 07-Sep-1951 Referring Provider:  Claretta Fraise, MD  Encounter Date: 07/23/2015      PT End of Session - 07/23/15 0814    Visit Number 12   Number of Visits 12   Date for PT Re-Evaluation 07/23/15   PT Start Time 0728   PT Stop Time 0827   PT Time Calculation (min) 59 min   Activity Tolerance Patient tolerated treatment well   Behavior During Therapy [facility] for tasks assessed/performed      Past Medical History  Diagnosis Date  . Hypertension   . Hyperlipidemia   . Allergy 1997    knees  . Obesity   . GERD (gastroesophageal reflux disease)   . Seasonal allergies   . Arthritis   . Sleep apnea     cpap  . Pneumonia     1 month ago - rsolved    Past Surgical History  Procedure Laterality Date  . Abdominal hysterectomy    . Tubal ligation    . Phlebectomy    . Total knee arthroplasty Right 10/22/2013    Procedure: RIGHT TOTAL KNEE ARTHROPLASTY;  Surgeon: Gearlean Alf, MD;  [facility]: [facility];  Service: Orthopedics;  Laterality: Right;  . Ganglion cyst excision      left  . Breast surgery      fibroid cysts removed  . Total knee arthroplasty Left 06/11/2015    Procedure: LEFT TOTAL KNEE ARTHROPLASTY;  Surgeon: Gaynelle Arabian, MD;  [facility];  Service: Orthopedics;  Laterality: Left;    There were no vitals filed for this visit.  Visit Diagnosis:  Stiffness of left knee  Pain in left knee  Edema  Difficulty walking      Subjective Assessment - 07/23/15 0735    Subjective stiff knee today, MD got 106 degrees and wants increase of knee flexion and to cont therapy    Limitations Walking;House hold activities   How long can you sit comfortably? Pt. states she does not have significant difficulty sitting for prolonged  periods of time.   How long can you stand comfortably? Pt. states she can stand for prolonged periods of time.   How long can you walk comfortably? Patient is using [facility] today   Currently in Pain? Yes   Pain Score 3    Pain Location Knee   Pain Orientation Left   Pain Descriptors / Indicators Tender   Pain Type Surgical pain   Pain Onset More than a month ago   Pain Frequency Constant   Aggravating Factors  ROM activities   Pain Relieving Factors meds/ ice/ rest            [facility] PT Assessment - 07/23/15 0001    ROM / Strength   AROM / PROM / Strength AROM;PROM   AROM   Overall AROM  Deficits   AROM Assessment Site Knee   Right/Left Knee Left   Left Knee Extension -6   Left Knee Flexion 90   PROM   Overall PROM  Deficits   PROM Assessment Site Knee   Right/Left Knee Left   Left Knee Extension -3   Left Knee Flexion 105                     [facility] Adult PT Treatment/Exercise - 07/23/15 0001  Knee/Hip Exercises: Stretches   Knee: Self-Stretch to increase Flexion Left;3 reps;30 seconds   Knee/Hip Exercises: Aerobic   Nustep L5 x15 min   Knee/Hip Exercises: Standing   Rocker Board 3 minutes   Electrical Stimulation   Electrical Stimulation Location left knee   Electrical Stimulation Action premod   Electrical Stimulation Parameters 1-10HZ    Electrical Stimulation Goals Pain   Vasopneumatic   Number Minutes Vasopneumatic  15 minutes   Vasopnuematic Location  Knee   Vasopneumatic Pressure Medium   Manual Therapy   Manual Therapy Passive ROM   Passive ROM low load stretch for flexion and ext                  PT Short Term Goals - 07/01/15 0820    PT SHORT TERM GOAL #1   Title I with initial HEP   Time 2   Period Weeks   Status Achieved   PT SHORT TERM GOAL #2   Title able to ambulate safely with SPC (07/09/15)   Time 2   Period Weeks   Status On-going   PT SHORT TERM GOAL #3   Title improved left knee ROM >=  -3 to 110 deg (07/23/15)    Time 4   Period Weeks   Status On-going           PT Long Term Goals - 07/01/15 8756    PT LONG TERM GOAL #1   Title I with advanced hep   Time 8   Period Weeks   Status On-going   PT LONG TERM GOAL #2   Title improved left knee ROM >=  -1 to 125 deg   Time 8   Period Weeks   Status On-going   PT LONG TERM GOAL #3   Title able to ambulate safely without AD   Time 8   Status On-going   PT LONG TERM GOAL #4   Title demo 4+/5 or better LLE strength to improve function   Time 8   Period Weeks   Status On-going   PT LONG TERM GOAL #5   Title improve FOTO to CK   Time 8   Period Weeks   Status On-going               Plan - 07/23/15 4332    Clinical Impression Statement Patient progressing with flexion PROM today, less range with ext. Patient went to MD and is to cont therapy for ROM. Waiting for re-cert to be signed by MD. Unable to meet any further goals due to pain, strength and ROM deficits.   Pt will benefit from skilled therapeutic intervention in order to improve on the following deficits Abnormal gait;Pain;Decreased activity tolerance;Decreased range of motion;Decreased strength;Increased edema   Rehab Potential Excellent   PT Frequency 3x / week   PT Duration 8 weeks   PT Treatment/Interventions ADLs/Self Care Home Management;Cryotherapy;Occupational psychologist;Therapeutic exercise;Balance training;Neuromuscular re-education;Manual techniques;Patient/family education;Passive range of motion;Vasopneumatic Device;Taping   PT Next Visit Plan Continue per MPT POC/ MD POC/ TKR protocol.    Consulted and Agree with Plan of Care Patient        Problem List Patient Active Problem List   Diagnosis Date Noted  . OSA (obstructive sleep apnea) 03/25/2014  . Chest pain 03/12/2014  . Abnormal ECG 03/12/2014  . OA (osteoarthritis) of knee 10/22/2013  . Obesity, unspecified 07/02/2013  . Arthritis 07/02/2013  . Genu varus 07/02/2013   . Mouth dryness 07/02/2013  . Benign paroxysmal positional  vertigo 07/02/2013  . Unspecified vitamin D deficiency 07/02/2013  . Hypertension 05/04/2013  . Hyperlipidemia 05/04/2013    Phillips Climes, PTA 07/23/2015, 8:36 AM  [facility] [address] [place], [place], [address] Phone: [phone]   Fax:  [phone]

## 2015-07-25 ENCOUNTER — Ambulatory Visit: Payer: Medicare Other | Admitting: Physical Therapy

## 2015-07-25 ENCOUNTER — Encounter: Payer: Self-pay | Admitting: Physical Therapy

## 2015-07-25 DIAGNOSIS — R262 Difficulty in walking, not elsewhere classified: Secondary | ICD-10-CM | POA: Diagnosis not present

## 2015-07-25 DIAGNOSIS — M25662 Stiffness of left knee, not elsewhere classified: Secondary | ICD-10-CM

## 2015-07-25 DIAGNOSIS — R609 Edema, unspecified: Secondary | ICD-10-CM

## 2015-07-25 DIAGNOSIS — M25562 Pain in left knee: Secondary | ICD-10-CM

## 2015-07-25 NOTE — Therapy (Signed)
[facility] [place], [place], [address] Phone: [phone]   Fax:  [phone]  Physical Therapy Treatment  Patient Details  Name: Bethany [name] MRN: [id number] Date of Birth: 1951/09/28 Referring Provider:  Claretta Fraise, MD  Encounter Date: 07/25/2015      PT End of Session - 07/25/15 0736    Visit Number 13   Number of Visits 12   Date for PT Re-Evaluation 07/23/15  Currently waiting on new order from MD.   PT Start Time 0734   PT Stop Time 0823   PT Time Calculation (min) 49 min   Activity Tolerance Patient tolerated treatment well   Behavior During Therapy [facility] for tasks assessed/performed      Past Medical History  Diagnosis Date  . Hypertension   . Hyperlipidemia   . Allergy 1997    knees  . Obesity   . GERD (gastroesophageal reflux disease)   . Seasonal allergies   . Arthritis   . Sleep apnea     cpap  . Pneumonia     1 month ago - rsolved    Past Surgical History  Procedure Laterality Date  . Abdominal hysterectomy    . Tubal ligation    . Phlebectomy    . Total knee arthroplasty Right 10/22/2013    Procedure: RIGHT TOTAL KNEE ARTHROPLASTY;  Surgeon: Gearlean Alf, MD;  Location: [facility];  Service: Orthopedics;  Laterality: Right;  . Ganglion cyst excision      left  . Breast surgery      fibroid cysts removed  . Total knee arthroplasty Left 06/11/2015    Procedure: LEFT TOTAL KNEE ARTHROPLASTY;  Surgeon: Gaynelle Arabian, MD;  [facility];  Service: Orthopedics;  Laterality: Left;    There were no vitals filed for this visit.  Visit Diagnosis:  Stiffness of left knee  Pain in left knee  Edema  Difficulty walking      Subjective Assessment - 07/25/15 0736    Subjective States that her knee is just sore today. Reports that she had increased pain last night.   Limitations Walking;House hold activities   How long can you sit comfortably? Pt. states she does not have significant  difficulty sitting for prolonged periods of time.   How long can you stand comfortably? Pt. states she can stand for prolonged periods of time.   How long can you walk comfortably? Patient is using [facility] today   Patient Stated Goals Pt. would like to resume all functional activities without limitations in left knee.   Currently in Pain? No/denies            [facility] PT Assessment - 07/25/15 0001    Assessment   Medical Diagnosis Lt TKR   Onset Date/Surgical Date 06/11/15   Next MD Visit 07/2015   Prior Therapy HHPT x 5 days   ROM / Strength   AROM / PROM / Strength AROM   AROM   Overall AROM  Deficits   AROM Assessment Site Knee   Right/Left Knee Left   Left Knee Extension 0   Left Knee Flexion 101                     [facility] Adult PT Treatment/Exercise - 07/25/15 0001    Knee/Hip Exercises: Aerobic   Stationary Bike x10 min   Knee/Hip Exercises: Standing   Forward Lunges Left;2 sets;10 reps;3 seconds;Other (comment)  on 14" step   Forward Step Up Left;3 sets;10  reps;Hand Hold: 2;Step Height: 6"   Rocker Board 3 minutes   Modalities   Modalities Designer, multimedia Location L knee   Electrical Stimulation Action Pre-Mod   Electrical Stimulation Parameters 80-150 Hz x15 min   Electrical Stimulation Goals Pain   Vasopneumatic   Number Minutes Vasopneumatic  15 minutes   Vasopnuematic Location  Knee   Vasopneumatic Pressure Medium   Vasopneumatic Temperature  34   Manual Therapy   Manual Therapy Passive ROM;Soft tissue mobilization   Soft tissue mobilization L patellar mobilzations in L/R, sup/inf, tilts   Passive ROM L knee into flex/ext with gentle holds at end range                  PT Short Term Goals - 07/01/15 0820    PT SHORT TERM GOAL #1   Title I with initial HEP   Time 2   Period Weeks   Status Achieved   PT SHORT TERM GOAL #2   Title able to ambulate safely with SPC  (07/09/15)   Time 2   Period Weeks   Status On-going   PT SHORT TERM GOAL #3   Title improved left knee ROM >=  -3 to 110 deg (07/23/15)   Time 4   Period Weeks   Status On-going           PT Long Term Goals - 07/01/15 7628    PT LONG TERM GOAL #1   Title I with advanced hep   Time 8   Period Weeks   Status On-going   PT LONG TERM GOAL #2   Title improved left knee ROM >=  -1 to 125 deg   Time 8   Period Weeks   Status On-going   PT LONG TERM GOAL #3   Title able to ambulate safely without AD   Time 8   Status On-going   PT LONG TERM GOAL #4   Title demo 4+/5 or better LLE strength to improve function   Time 8   Period Weeks   Status On-going   PT LONG TERM GOAL #5   Title improve FOTO to CK   Time 8   Period Weeks   Status On-going               Plan - 07/25/15 0810    Clinical Impression Statement Patient tolerated treatment good today without complaint of increased pain. Tolerated stationary bike well without complaint of pain verbalized but no full revolutions were completed today. Continues to lack in L knee flexion. AROM L knee 0-101 deg in clinic today. Normal modalities response noted following removal of the modalities. Denied pain following treatment.   Pt will benefit from skilled therapeutic intervention in order to improve on the following deficits Abnormal gait;Pain;Decreased activity tolerance;Decreased range of motion;Decreased strength;Increased edema   Rehab Potential Excellent   PT Frequency 3x / week   PT Duration 8 weeks   PT Treatment/Interventions ADLs/Self Care Home Management;Cryotherapy;Occupational psychologist;Therapeutic exercise;Balance training;Neuromuscular re-education;Manual techniques;Patient/family education;Passive range of motion;Vasopneumatic Device;Taping   PT Next Visit Plan Continue per MPT POC/ MD POC/ TKR protocol.    Consulted and Agree with Plan of Care Patient        Problem  List Patient Active Problem List   Diagnosis Date Noted  . OSA (obstructive sleep apnea) 03/25/2014  . Chest pain 03/12/2014  . Abnormal ECG 03/12/2014  . OA (osteoarthritis) of knee 10/22/2013  .  Obesity, unspecified 07/02/2013  . Arthritis 07/02/2013  . Genu varus 07/02/2013  . Mouth dryness 07/02/2013  . Benign paroxysmal positional vertigo 07/02/2013  . Unspecified vitamin D deficiency 07/02/2013  . Hypertension 05/04/2013  . Hyperlipidemia 05/04/2013    Wynelle Fanny, PTA 07/25/2015, 8:33 AM  [facility] [address] [place], [place], [address] Phone: [phone]   Fax:  [phone]

## 2015-07-28 ENCOUNTER — Ambulatory Visit: Payer: Medicare Other | Attending: Orthopedic Surgery | Admitting: *Deleted

## 2015-07-28 ENCOUNTER — Encounter: Payer: Self-pay | Admitting: *Deleted

## 2015-07-28 DIAGNOSIS — R262 Difficulty in walking, not elsewhere classified: Secondary | ICD-10-CM | POA: Diagnosis not present

## 2015-07-28 DIAGNOSIS — M25662 Stiffness of left knee, not elsewhere classified: Secondary | ICD-10-CM | POA: Insufficient documentation

## 2015-07-28 DIAGNOSIS — R609 Edema, unspecified: Secondary | ICD-10-CM | POA: Insufficient documentation

## 2015-07-28 DIAGNOSIS — M25562 Pain in left knee: Secondary | ICD-10-CM | POA: Insufficient documentation

## 2015-07-28 NOTE — Therapy (Signed)
[facility] [place], [place], [address] Phone: [phone]   Fax:  [phone]  Physical Therapy Treatment  Patient Details  Name: Bethany [name] MRN: [id number] Date of Birth: 15-Dec-1951 Referring Provider:  Claretta Fraise, MD  Encounter Date: 07/28/2015      PT End of Session - 07/28/15 0830    Visit Number 14   Number of Visits 12  PT TO CALL MD OFFICE   Date for PT Re-Evaluation 07/23/15   PT Start Time 0815   PT Stop Time 0914   PT Time Calculation (min) 59 min      Past Medical History  Diagnosis Date  . Hypertension   . Hyperlipidemia   . Allergy 1997    knees  . Obesity   . GERD (gastroesophageal reflux disease)   . Seasonal allergies   . Arthritis   . Sleep apnea     cpap  . Pneumonia     1 month ago - rsolved    Past Surgical History  Procedure Laterality Date  . Abdominal hysterectomy    . Tubal ligation    . Phlebectomy    . Total knee arthroplasty Right 10/22/2013    Procedure: RIGHT TOTAL KNEE ARTHROPLASTY;  Surgeon: Gearlean Alf, MD;  Location: [facility];  Service: Orthopedics;  Laterality: Right;  . Ganglion cyst excision      left  . Breast surgery      fibroid cysts removed  . Total knee arthroplasty Left 06/11/2015    Procedure: LEFT TOTAL KNEE ARTHROPLASTY;  Surgeon: Gaynelle Arabian, MD;  Location: [facility];  Service: Orthopedics;  Laterality: Left;    There were no vitals filed for this visit.  Visit Diagnosis:  Stiffness of left knee  Pain in left knee      Subjective Assessment - 07/28/15 0826    Subjective States that her knee is just sore today. Reports that she has increased pain at times.   Patient is accompained by: Family member   How long can you sit comfortably? Pt. states she does not have significant difficulty sitting for prolonged periods of time.   How long can you stand comfortably? Pt. states she can stand for prolonged periods of time.   How long can you walk  comfortably? Patient is using [facility] today   Patient Stated Goals Pt. would like to resume all functional activities without limitations in left knee.   Currently in Pain? Yes   Pain Score 2    Pain Location Knee   Pain Orientation Left   Pain Descriptors / Indicators Tender   Pain Type Surgical pain   Pain Onset More than a month ago   Pain Frequency Intermittent   Aggravating Factors  stretching   Pain Relieving Factors meds, ice ,rest                         [facility] Adult PT Treatment/Exercise - 07/28/15 0001    Knee/Hip Exercises: Aerobic   Nustep L5 x15 min   Knee/Hip Exercises: Standing   Forward Step Up Left;3 sets;10 reps;Hand Hold: 2;Step Height: 6"   Rocker Board 3 minutes  calf stretching   Other Standing Knee Exercises 14 inch flexion stretching LT knee x 15   Electrical Stimulation   Electrical Stimulation Location LT knee premod 80-150hz  x15 min   Electrical Stimulation Goals Pain   Vasopneumatic   Number Minutes Vasopneumatic  15 minutes   Vasopnuematic Location  Knee  Vasopneumatic Pressure Medium   Vasopneumatic Temperature  34   Manual Therapy   Manual Therapy Passive ROM;Soft tissue mobilization   Soft tissue mobilization L patellar mobilzations in L/R, sup/inf, tilts   Passive ROM L knee into flex/ext with long holds at end range                  PT Short Term Goals - 07/01/15 0820    PT SHORT TERM GOAL #1   Title I with initial HEP   Time 2   Period Weeks   Status Achieved   PT SHORT TERM GOAL #2   Title able to ambulate safely with SPC (07/09/15)   Time 2   Period Weeks   Status On-going   PT SHORT TERM GOAL #3   Title improved left knee ROM >=  -3 to 110 deg (07/23/15)   Time 4   Period Weeks   Status On-going           PT Long Term Goals - 07/01/15 4944    PT LONG TERM GOAL #1   Title I with advanced hep   Time 8   Period Weeks   Status On-going   PT LONG TERM GOAL #2   Title improved left knee ROM >=  -1  to 125 deg   Time 8   Period Weeks   Status On-going   PT LONG TERM GOAL #3   Title able to ambulate safely without AD   Time 8   Status On-going   PT LONG TERM GOAL #4   Title demo 4+/5 or better LLE strength to improve function   Time 8   Period Weeks   Status On-going   PT LONG TERM GOAL #5   Title improve FOTO to CK   Time 8   Period Weeks   Status On-going               Plan - 07/28/15 0834    Clinical Impression Statement Pt continues to do well with LT TKR rehab. She continues to use [facility] for ambulation outside the home and no AD inside. Her ROM is about the same 0-102 degrees and still with low pain levels. She is stepping up easier now on steps, but uses minimal UE assist. Goals are ongoing.   Pt will benefit from skilled therapeutic intervention in order to improve on the following deficits Abnormal gait;Pain;Decreased activity tolerance;Decreased range of motion;Decreased strength;Increased edema   Rehab Potential Excellent   PT Frequency 3x / week   PT Duration 8 weeks   PT Treatment/Interventions ADLs/Self Care Home Management;Cryotherapy;Occupational psychologist;Therapeutic exercise;Balance training;Neuromuscular re-education;Manual techniques;Patient/family education;Passive range of motion;Vasopneumatic Device;Taping   PT Next Visit Plan Continue per MPT POC/ MD POC/ TKR protocol.    Consulted and Agree with Plan of Care Patient        Problem List Patient Active Problem List   Diagnosis Date Noted  . OSA (obstructive sleep apnea) 03/25/2014  . Chest pain 03/12/2014  . Abnormal ECG 03/12/2014  . OA (osteoarthritis) of knee 10/22/2013  . Obesity, unspecified 07/02/2013  . Arthritis 07/02/2013  . Genu varus 07/02/2013  . Mouth dryness 07/02/2013  . Benign paroxysmal positional vertigo 07/02/2013  . Unspecified vitamin D deficiency 07/02/2013  . Hypertension 05/04/2013  . Hyperlipidemia 05/04/2013    Sundai Probert,CHRIS,  PTA 07/28/2015, 9:19 AM  [facility] [address] [place], [place], [address] Phone: [phone]   Fax:  [phone]

## 2015-07-30 ENCOUNTER — Ambulatory Visit: Payer: Medicare Other | Admitting: *Deleted

## 2015-07-30 ENCOUNTER — Encounter: Payer: Self-pay | Admitting: *Deleted

## 2015-07-30 DIAGNOSIS — M25662 Stiffness of left knee, not elsewhere classified: Secondary | ICD-10-CM | POA: Diagnosis not present

## 2015-07-30 DIAGNOSIS — M25562 Pain in left knee: Secondary | ICD-10-CM

## 2015-07-30 DIAGNOSIS — R262 Difficulty in walking, not elsewhere classified: Secondary | ICD-10-CM | POA: Diagnosis not present

## 2015-07-30 DIAGNOSIS — R609 Edema, unspecified: Secondary | ICD-10-CM | POA: Diagnosis not present

## 2015-07-30 NOTE — Therapy (Signed)
[facility] [place], [place], [address] Phone: [phone]   Fax:  [phone]  Physical Therapy Treatment  Patient Details  Name: Bethany [name] MRN: [id number] Date of Birth: [birth year] Referring Provider:  Claretta Fraise, MD  Encounter Date: 07/30/2015      PT End of Session - 07/30/15 0924    Visit Number 15   Number of Visits 12   Date for PT Re-Evaluation 07/23/15   PT Start Time 0900   PT Stop Time 0950   PT Time Calculation (min) 50 min      Past Medical History  Diagnosis Date  . Hypertension   . Hyperlipidemia   . Allergy 1997    knees  . Obesity   . GERD (gastroesophageal reflux disease)   . Seasonal allergies   . Arthritis   . Sleep apnea     cpap  . Pneumonia     1 month ago - rsolved    Past Surgical History  Procedure Laterality Date  . Abdominal hysterectomy    . Tubal ligation    . Phlebectomy    . Total knee arthroplasty Right 10/22/2013    Procedure: RIGHT TOTAL KNEE ARTHROPLASTY;  Surgeon: Gearlean Alf, MD;  Location: [facility];  Service: Orthopedics;  Laterality: Right;  . Ganglion cyst excision      left  . Breast surgery      fibroid cysts removed  . Total knee arthroplasty Left 06/11/2015    Procedure: LEFT TOTAL KNEE ARTHROPLASTY;  Surgeon: Gaynelle Arabian, MD;  Location: [facility];  Service: Orthopedics;  Laterality: Left;    There were no vitals filed for this visit.  Visit Diagnosis:  Stiffness of left knee  Pain in left knee      Subjective Assessment - 07/30/15 0906    Subjective States that her knee is just sore today. Reports that she has increased pain at times.   Patient is accompained by: Family member   Limitations Walking;House hold activities   How long can you sit comfortably? Pt. states she does not have significant difficulty sitting for prolonged periods of time.   How long can you stand comfortably? Pt. states she can stand for prolonged periods of time.   How long can you walk comfortably? Patient is using [facility] today   Patient Stated Goals Pt. would like to resume all functional activities without limitations in left knee.   Currently in Pain? Yes   Pain Score 3    Pain Location Knee   Pain Orientation Left   Pain Descriptors / Indicators Tender   Pain Type Surgical pain   Pain Onset More than a month ago   Pain Frequency Intermittent   Aggravating Factors  stretching   Pain Relieving Factors meds, ice, rest                         [facility] Adult PT Treatment/Exercise - 07/30/15 0001    Exercises   Exercises Knee/Hip   Knee/Hip Exercises: Aerobic   Nustep L5 x15 min, seat progression for ROM   Knee/Hip Exercises: Standing   Forward Lunges Left;2 sets;10 reps;3 seconds;Other (comment)  on 14" step   Forward Step Up Left;3 sets;10 reps;Hand Hold: 2;Step Height: 6"   Rocker Board 3 minutes  calf stretching   SLS on LT LE   Electrical Stimulation   Electrical Stimulation Location LT knee premod 80-150hz  x15 min   Electrical Stimulation Goals Pain  Vasopneumatic   Number Minutes Vasopneumatic  15 minutes   Vasopnuematic Location  Knee   Vasopneumatic Pressure Medium   Vasopneumatic Temperature  34   Manual Therapy   Manual Therapy Passive ROM;Soft tissue mobilization   Soft tissue mobilization L patellar mobilzations in L/R, sup/inf, tilts   Passive ROM L knee into flex/ext with long holds at end range                  PT Short Term Goals - 07/01/15 0820    PT SHORT TERM GOAL #1   Title I with initial HEP   Time 2   Period Weeks   Status Achieved   PT SHORT TERM GOAL #2   Title able to ambulate safely with SPC (07/09/15)   Time 2   Period Weeks   Status On-going   PT SHORT TERM GOAL #3   Title improved left knee ROM >=  -3 to 110 deg (07/23/15)   Time 4   Period Weeks   Status On-going           PT Long Term Goals - 07/01/15 1572    PT LONG TERM GOAL #1   Title I with advanced hep    Time 8   Period Weeks   Status On-going   PT LONG TERM GOAL #2   Title improved left knee ROM >=  -1 to 125 deg   Time 8   Period Weeks   Status On-going   PT LONG TERM GOAL #3   Title able to ambulate safely without AD   Time 8   Status On-going   PT LONG TERM GOAL #4   Title demo 4+/5 or better LLE strength to improve function   Time 8   Period Weeks   Status On-going   PT LONG TERM GOAL #5   Title improve FOTO to CK   Time 8   Period Weeks   Status On-going               Plan - 07/30/15 6203    Clinical Impression Statement Pt did great again today and feels that she is progressing well with LT knee. Her ROM is about the same today 0-105 degrees in supine. Goals are ongoing   Pt will benefit from skilled therapeutic intervention in order to improve on the following deficits Abnormal gait;Pain;Decreased activity tolerance;Decreased range of motion;Decreased strength;Increased edema   Rehab Potential Excellent   PT Frequency 3x / week   PT Duration 8 weeks   PT Treatment/Interventions ADLs/Self Care Home Management;Cryotherapy;Occupational psychologist;Therapeutic exercise;Balance training;Neuromuscular re-education;Manual techniques;Patient/family education;Passive range of motion;Vasopneumatic Device;Taping   Consulted and Agree with Plan of Care Patient        Problem List Patient Active Problem List   Diagnosis Date Noted  . OSA (obstructive sleep apnea) 03/25/2014  . Chest pain 03/12/2014  . Abnormal ECG 03/12/2014  . OA (osteoarthritis) of knee 10/22/2013  . Obesity, unspecified 07/02/2013  . Arthritis 07/02/2013  . Genu varus 07/02/2013  . Mouth dryness 07/02/2013  . Benign paroxysmal positional vertigo 07/02/2013  . Unspecified vitamin D deficiency 07/02/2013  . Hypertension 05/04/2013  . Hyperlipidemia 05/04/2013    Arbie Blankley,CHRIS, PTA 07/30/2015, 12:35 PM  [facility] [address] [place], [place], [address] Phone: [phone]   Fax:  [phone]

## 2015-08-01 ENCOUNTER — Encounter: Payer: Self-pay | Admitting: Physical Therapy

## 2015-08-01 ENCOUNTER — Ambulatory Visit: Payer: Medicare Other | Admitting: Physical Therapy

## 2015-08-01 DIAGNOSIS — R262 Difficulty in walking, not elsewhere classified: Secondary | ICD-10-CM | POA: Diagnosis not present

## 2015-08-01 DIAGNOSIS — M25662 Stiffness of left knee, not elsewhere classified: Secondary | ICD-10-CM

## 2015-08-01 DIAGNOSIS — R609 Edema, unspecified: Secondary | ICD-10-CM

## 2015-08-01 DIAGNOSIS — M25562 Pain in left knee: Secondary | ICD-10-CM | POA: Diagnosis not present

## 2015-08-01 NOTE — Therapy (Signed)
[facility] [place], [place], [address] Phone: [phone]   Fax:  [phone]  Physical Therapy Treatment  Patient Details  Name: Bethany [name] MRN: [id number] Date of Birth: 11/28/1951 Referring Provider:  Claretta Fraise, MD  Encounter Date: 08/01/2015      PT End of Session - 08/01/15 0740    Visit Number 16   Number of Visits 12   Date for PT Re-Evaluation 07/23/15   PT Start Time 0737   PT Stop Time 0832   PT Time Calculation (min) 55 min   Equipment Utilized During Treatment Other (comment)  Brought in QC but did not use for ambulation   Activity Tolerance Patient tolerated treatment well   Behavior During Therapy [facility] for tasks assessed/performed      Past Medical History  Diagnosis Date  . Hypertension   . Hyperlipidemia   . Allergy 1997    knees  . Obesity   . GERD (gastroesophageal reflux disease)   . Seasonal allergies   . Arthritis   . Sleep apnea     cpap  . Pneumonia     1 month ago - rsolved    Past Surgical History  Procedure Laterality Date  . Abdominal hysterectomy    . Tubal ligation    . Phlebectomy    . Total knee arthroplasty Right 10/22/2013    Procedure: RIGHT TOTAL KNEE ARTHROPLASTY;  Surgeon: Gearlean Alf, MD;  Location: [facility];  Service: Orthopedics;  Laterality: Right;  . Ganglion cyst excision      left  . Breast surgery      fibroid cysts removed  . Total knee arthroplasty Left 06/11/2015    Procedure: LEFT TOTAL KNEE ARTHROPLASTY;  Surgeon: Gaynelle Arabian, MD;  Location: [facility];  Service: Orthopedics;  Laterality: Left;    There were no vitals filed for this visit.  Visit Diagnosis:  Stiffness of left knee  Pain in left knee  Edema  Difficulty walking      Subjective Assessment - 08/01/15 0739    Subjective Reports that she has some pain and tightness in the inferior aspect of L knee/incision.   Limitations Walking;House hold activities   How long can you  sit comfortably? Pt. states she does not have significant difficulty sitting for prolonged periods of time.   How long can you stand comfortably? Pt. states she can stand for prolonged periods of time.   How long can you walk comfortably? Patient is using [facility] today            [facility] PT Assessment - 08/01/15 0001    Assessment   Medical Diagnosis Lt TKR   Onset Date/Surgical Date 06/11/15   Next MD Visit 07/2015   Prior Therapy HHPT x 5 days   ROM / Strength   AROM / PROM / Strength AROM   AROM   Overall AROM  Deficits   AROM Assessment Site Knee   Right/Left Knee Left   Left Knee Extension 0   Left Knee Flexion 110                     [facility] Adult PT Treatment/Exercise - 08/01/15 0001    Ambulation/Gait   Ambulation/Gait Yes   Ambulation/Gait Assistance 6: Modified independent (Device/Increase time)   Ambulation Distance (Feet) 120 Feet  120 ft with SPC; 90 ft without AD   Assistive device Straight cane;None   Gait Pattern Within Functional Limits   Ambulation Surface Level;Indoor  Gait Comments Reports that she doesn't use QC at home only when she comes to therapy secondary to L ITB cramping intermittantly   Knee/Hip Exercises: Aerobic   Nustep L6 x12 min   Knee/Hip Exercises: Standing   Forward Lunges Left;2 sets;10 reps;3 seconds;Other (comment)  on 14" step   Lateral Step Up Left;3 sets;10 reps;Hand Hold: 2;Step Height: 6"   Forward Step Up Left;3 sets;10 reps;Hand Hold: 2;Step Height: 6"   Rocker Board 4 minutes   Modalities   Modalities Designer, multimedia Location L knee   Electrical Stimulation Action Pre-Mod   Electrical Stimulation Parameters 80-150 Hz x15 min   Electrical Stimulation Goals Pain   Vasopneumatic   Number Minutes Vasopneumatic  15 minutes   Vasopnuematic Location  Knee   Vasopneumatic Pressure Medium   Manual Therapy   Manual Therapy Passive ROM;Soft tissue  mobilization;Myofascial release   Soft tissue mobilization L patellar mobilzations in L/R, sup/inf, tilts; L knee scar mobilizations especially in the inferior incision   Myofascial Release STW/TPR to L ITB to decrease tightness and pain   Passive ROM L knee into flex/ext with long holds at end range                  PT Short Term Goals - 08/01/15 6834    PT SHORT TERM GOAL #1   Title I with initial HEP   Time 2   Period Weeks   Status Achieved   PT SHORT TERM GOAL #2   Title able to ambulate safely with SPC (07/09/15)   Time 2   Period Weeks   Status Achieved   PT SHORT TERM GOAL #3   Title improved left knee ROM >=  -3 to 110 deg (07/23/15)   Time 4   Period Weeks   Status Achieved  0-110 deg L knee 08/01/2015           PT Long Term Goals - 08/01/15 0820    PT LONG TERM GOAL #1   Title I with advanced hep   Time 8   Period Weeks   Status On-going   PT LONG TERM GOAL #2   Title improved left knee ROM >=  -1 to 125 deg   Time 8   Period Weeks   Status On-going   PT LONG TERM GOAL #3   Title able to ambulate safely without AD   Time 8   Status Achieved   PT LONG TERM GOAL #4   Title demo 4+/5 or better LLE strength to improve function   Time 8   Period Weeks   Status On-going   PT LONG TERM GOAL #5   Title improve FOTO to CK   Time 8   Period Weeks   Status On-going               Plan - 08/01/15 1962    Clinical Impression Statement Patient continues to progress with treatments. Requires minimal verbal cueing for technique of exercises. AROM of L knee measured at 0-110 deg today. Achieved all ST goals set at evaluation. Remaining goals are on-going secondary to decreased L knee strength, and L knee flexion has not achieved 125 deg as of 08/01/2015. Able to ambulate without AD safely but patient states that she keeps AD around if L ITB begins cramping or hurting. Notable tightness and TPs noted throughout manual therapy. Continues to have some  restriction in L patellar mobility into sup/inf direction and  incision tightness in the inferior most region of the incision. Normal modalities response noted following removal of the modalities. Denied L knee pain following treatment and reported soreness just inferior to the L greater trochanter where a TP was present.   Pt will benefit from skilled therapeutic intervention in order to improve on the following deficits Abnormal gait;Pain;Decreased activity tolerance;Decreased range of motion;Decreased strength;Increased edema   Rehab Potential Excellent   PT Frequency 3x / week   PT Duration 8 weeks   PT Treatment/Interventions ADLs/Self Care Home Management;Cryotherapy;Occupational psychologist;Therapeutic exercise;Balance training;Neuromuscular re-education;Manual techniques;Patient/family education;Passive range of motion;Vasopneumatic Device;Taping   PT Next Visit Plan Continue per MPT POC/ MD POC/ TKR protocol.    Consulted and Agree with Plan of Care Patient        Problem List Patient Active Problem List   Diagnosis Date Noted  . OSA (obstructive sleep apnea) 03/25/2014  . Chest pain 03/12/2014  . Abnormal ECG 03/12/2014  . OA (osteoarthritis) of knee 10/22/2013  . Obesity, unspecified 07/02/2013  . Arthritis 07/02/2013  . Genu varus 07/02/2013  . Mouth dryness 07/02/2013  . Benign paroxysmal positional vertigo 07/02/2013  . Unspecified vitamin D deficiency 07/02/2013  . Hypertension 05/04/2013  . Hyperlipidemia 05/04/2013    Wynelle Fanny, PTA 08/01/2015, 8:39 AM  [facility] [address] [place], [place], [address] Phone: [phone]   Fax:  [phone]

## 2015-08-04 ENCOUNTER — Encounter: Payer: Self-pay | Admitting: Physical Therapy

## 2015-08-04 ENCOUNTER — Ambulatory Visit: Payer: Medicare Other | Admitting: Physical Therapy

## 2015-08-04 DIAGNOSIS — M25662 Stiffness of left knee, not elsewhere classified: Secondary | ICD-10-CM | POA: Diagnosis not present

## 2015-08-04 DIAGNOSIS — R609 Edema, unspecified: Secondary | ICD-10-CM

## 2015-08-04 DIAGNOSIS — M25562 Pain in left knee: Secondary | ICD-10-CM

## 2015-08-04 DIAGNOSIS — R262 Difficulty in walking, not elsewhere classified: Secondary | ICD-10-CM

## 2015-08-04 NOTE — Therapy (Signed)
[facility] [place], [place], [address] Phone: [phone]   Fax:  [phone]  Physical Therapy Treatment  Patient Details  Name: Bethany [name] MRN: [id number] Date of Birth: 1951-09-04 Referring Provider:  Claretta Fraise, MD  Encounter Date: 08/04/2015      PT End of Session - 08/04/15 0746    Visit Number 17   Number of Visits 12   Date for PT Re-Evaluation 07/23/15   PT Start Time 0735   PT Stop Time 0826   PT Time Calculation (min) 51 min   Activity Tolerance Patient tolerated treatment well   Behavior During Therapy [facility] for tasks assessed/performed      Past Medical History  Diagnosis Date  . Hypertension   . Hyperlipidemia   . Allergy 1997    knees  . Obesity   . GERD (gastroesophageal reflux disease)   . Seasonal allergies   . Arthritis   . Sleep apnea     cpap  . Pneumonia     1 month ago - rsolved    Past Surgical History  Procedure Laterality Date  . Abdominal hysterectomy    . Tubal ligation    . Phlebectomy    . Total knee arthroplasty Right 10/22/2013    Procedure: RIGHT TOTAL KNEE ARTHROPLASTY;  Surgeon: Gearlean Alf, MD;  Location: [facility];  Service: Orthopedics;  Laterality: Right;  . Ganglion cyst excision      left  . Breast surgery      fibroid cysts removed  . Total knee arthroplasty Left 06/11/2015    Procedure: LEFT TOTAL KNEE ARTHROPLASTY;  Surgeon: Gaynelle Arabian, MD;  Location: [facility];  Service: Orthopedics;  Laterality: Left;    There were no vitals filed for this visit.  Visit Diagnosis:  Stiffness of left knee  Pain in left knee  Edema  Difficulty walking      Subjective Assessment - 08/04/15 0745    Subjective Reports stiffness in affected knee today.   Limitations Walking;House hold activities   How long can you sit comfortably? Pt. states she does not have significant difficulty sitting for prolonged periods of time.   How long can you stand comfortably?  Pt. states she can stand for prolonged periods of time.   How long can you walk comfortably? Patient is using [facility] today   Patient Stated Goals Pt. would like to resume all functional activities without limitations in left knee.   Currently in Pain? Yes   Pain Score 1    Pain Location Knee   Pain Orientation Left   Pain Descriptors / Indicators Other (Comment)  Stiffness   Pain Type Surgical pain   Pain Onset More than a month ago            [facility] PT Assessment - 08/04/15 0001    Assessment   Medical Diagnosis Lt TKR   Onset Date/Surgical Date 06/11/15   Next MD Visit 07/2015   Prior Therapy HHPT x 5 days                     [facility] Adult PT Treatment/Exercise - 08/04/15 0001    Knee/Hip Exercises: Stretches   Other Knee/Hip Stretches L ITB stretch 3 x 30 sec   Knee/Hip Exercises: Aerobic   Nustep L6 x12 min   Knee/Hip Exercises: Machines for Strengthening   Cybex Knee Extension 10# 3x10 reps   Cybex Knee Flexion 20# 3 x10 reps   Knee/Hip Exercises: Standing  Forward Lunges Left;2 sets;10 reps;3 seconds;Other (comment)  on 14" step   Rocker Board 3 minutes   Modalities   Modalities Designer, multimedia Location L knee   Electrical Stimulation Action Pre-Mod   Electrical Stimulation Parameters 80-150 Hz x15 min   Electrical Stimulation Goals Pain   Vasopneumatic   Number Minutes Vasopneumatic  15 minutes   Vasopnuematic Location  Knee   Vasopneumatic Pressure Medium                PT Education - 08/04/15 0817    Education provided Yes   Education Details HEP- ITB stretch; 20 minutes max on heating pad for ITB tightness and watch affected skin for burns   Person(s) Educated Patient   Methods Explanation;Demonstration;Verbal cues;Handout   Comprehension Verbalized understanding;Returned demonstration;Verbal cues required          PT Short Term Goals - 08/01/15 0819    PT  SHORT TERM GOAL #1   Title I with initial HEP   Time 2   Period Weeks   Status Achieved   PT SHORT TERM GOAL #2   Title able to ambulate safely with SPC (07/09/15)   Time 2   Period Weeks   Status Achieved   PT SHORT TERM GOAL #3   Title improved left knee ROM >=  -3 to 110 deg (07/23/15)   Time 4   Period Weeks   Status Achieved  0-110 deg L knee 08/01/2015           PT Long Term Goals - 08/01/15 0820    PT LONG TERM GOAL #1   Title I with advanced hep   Time 8   Period Weeks   Status On-going   PT LONG TERM GOAL #2   Title improved left knee ROM >=  -1 to 125 deg   Time 8   Period Weeks   Status On-going   PT LONG TERM GOAL #3   Title able to ambulate safely without AD   Time 8   Status Achieved   PT LONG TERM GOAL #4   Title demo 4+/5 or better LLE strength to improve function   Time 8   Period Weeks   Status On-going   PT LONG TERM GOAL #5   Title improve FOTO to CK   Time 8   Period Weeks   Status On-going               Plan - 08/04/15 0813    Clinical Impression Statement Patient continues to progress steadily in PT and tolerated machine strengthening well today without complaint of increased pain. Continues to have notable tightness in L IT Band today in clinic. Accepted new ITB stretching HEP to decrease tightness and pain without questions. Remaining goals considered on-going secondary to decreased L knee ROM, strength. Able to ambulate in PT gym without AD. Normal modalities response noted following removal of the modalities. Denied pain following treatment.   Pt will benefit from skilled therapeutic intervention in order to improve on the following deficits Abnormal gait;Pain;Decreased activity tolerance;Decreased range of motion;Decreased strength;Increased edema   Rehab Potential Excellent   PT Frequency 3x / week   PT Duration 8 weeks   PT Treatment/Interventions ADLs/Self Care Home Management;Cryotherapy;Psychologist, clinical;Therapeutic exercise;Balance training;Neuromuscular re-education;Manual techniques;Patient/family education;Passive range of motion;Vasopneumatic Device;Taping   PT Next Visit Plan Continue per MPT POC/ MD POC/ TKR protocol.    Consulted and Agree with Plan of  Care Patient        Problem List Patient Active Problem List   Diagnosis Date Noted  . OSA (obstructive sleep apnea) 03/25/2014  . Chest pain 03/12/2014  . Abnormal ECG 03/12/2014  . OA (osteoarthritis) of knee 10/22/2013  . Obesity, unspecified 07/02/2013  . Arthritis 07/02/2013  . Genu varus 07/02/2013  . Mouth dryness 07/02/2013  . Benign paroxysmal positional vertigo 07/02/2013  . Unspecified vitamin D deficiency 07/02/2013  . Hypertension 05/04/2013  . Hyperlipidemia 05/04/2013    Wynelle Fanny, PTA 08/04/2015, 8:31 AM  [facility] [address] [place], [place], [address] Phone: [phone]   Fax:  [phone]

## 2015-08-04 NOTE — Patient Instructions (Signed)
Stretching: Iliotibial Band   Lay on your back with a flat sheet/ dog leash around your foot. Lift your left leg straight up in the air and then let it fall over the right leg until you feel a stretch in the side of the left leg. Repeat _3___ times per set.  Hold 30 seconds. Do __1__ sets per session. Do _2-3___ sessions per day.  http://orth.exer.[email or web address]   Copyright  VHI. All rights reserved.

## 2015-08-06 ENCOUNTER — Ambulatory Visit: Payer: Medicare Other | Admitting: Physical Therapy

## 2015-08-06 ENCOUNTER — Encounter: Payer: Self-pay | Admitting: Physical Therapy

## 2015-08-06 DIAGNOSIS — R262 Difficulty in walking, not elsewhere classified: Secondary | ICD-10-CM

## 2015-08-06 DIAGNOSIS — R609 Edema, unspecified: Secondary | ICD-10-CM | POA: Diagnosis not present

## 2015-08-06 DIAGNOSIS — M25662 Stiffness of left knee, not elsewhere classified: Secondary | ICD-10-CM

## 2015-08-06 DIAGNOSIS — M25562 Pain in left knee: Secondary | ICD-10-CM

## 2015-08-06 NOTE — Therapy (Addendum)
[facility] [place], [place], [address] Phone: [phone]   Fax:  [phone]  Physical Therapy Treatment  Patient Details  Name: Bethany [name] MRN: [id number] Date of Birth: 02-14-1951 Referring Provider:  Claretta Fraise, MD  Encounter Date: 08/06/2015      PT End of Session - 08/06/15 0904    Visit Number 18   Number of Visits 24   Date for PT Re-Evaluation 09/18/2015   PT Start Time 0901   PT Stop Time 0955   PT Time Calculation (min) 54 min   Activity Tolerance Patient tolerated treatment well   Behavior During Therapy [facility] for tasks assessed/performed      Past Medical History  Diagnosis Date  . Hypertension   . Hyperlipidemia   . Allergy 1997    knees  . Obesity   . GERD (gastroesophageal reflux disease)   . Seasonal allergies   . Arthritis   . Sleep apnea     cpap  . Pneumonia     1 month ago - rsolved    Past Surgical History  Procedure Laterality Date  . Abdominal hysterectomy    . Tubal ligation    . Phlebectomy    . Total knee arthroplasty Right 10/22/2013    Procedure: RIGHT TOTAL KNEE ARTHROPLASTY;  Surgeon: Gearlean Alf, MD;  Location: [facility];  Service: Orthopedics;  Laterality: Right;  . Ganglion cyst excision      left  . Breast surgery      fibroid cysts removed  . Total knee arthroplasty Left 06/11/2015    Procedure: LEFT TOTAL KNEE ARTHROPLASTY;  Surgeon: Gaynelle Arabian, MD;  Location: [facility];  Service: Orthopedics;  Laterality: Left;    There were no vitals filed for this visit.  Visit Diagnosis:  Stiffness of left knee  Pain in left knee  Edema  Difficulty walking      Subjective Assessment - 08/06/15 0902    Subjective Reports knee "feeling good" prior to therapy. Reports not having cane with her today.   Limitations Walking;House hold activities   How long can you sit comfortably? Pt. states she does not have significant difficulty sitting for prolonged periods of  time.   How long can you stand comfortably? Pt. states she can stand for prolonged periods of time.   How long can you walk comfortably? Walked 5 min total comfortably   Patient Stated Goals Pt. would like to resume all functional activities without limitations in left knee.   Currently in Pain? No/denies            [facility] PT Assessment - 08/06/15 0001    Assessment   Medical Diagnosis Lt TKR   Onset Date/Surgical Date 06/11/15   Next MD Visit 08/19/2015   Prior Therapy [facility] x 5 days   ROM / Strength   AROM / PROM / Strength AROM   AROM   Overall AROM  Deficits   AROM Assessment Site Knee   Right/Left Knee Left   Left Knee Flexion 112                     [facility] Adult PT Treatment/Exercise - 08/06/15 0001    Knee/Hip Exercises: Aerobic   Stationary Bike x10 min, seat    Knee/Hip Exercises: Machines for Strengthening   Cybex Knee Extension 10# 3x10 reps   Cybex Knee Flexion 20# 3 x10 reps   Knee/Hip Exercises: Standing   Rocker Board 4 minutes   Modalities  Modalities Designer, multimedia Location L knee   Electrical Stimulation Action Pre-Mod   Electrical Stimulation Parameters 80-150 Hz x15 min   Electrical Stimulation Goals Pain   Vasopneumatic   Number Minutes Vasopneumatic  15 minutes   Vasopnuematic Location  Knee   Vasopneumatic Pressure Medium   Vasopneumatic Temperature  48   Manual Therapy   Manual Therapy Passive ROM;Soft tissue mobilization;Myofascial release   Soft tissue mobilization L patellar mobilzations in L/R, sup/inf, tilts;    Passive ROM L knee into flex/ext with long holds at end range                  PT Short Term Goals - 08/01/15 3338    PT SHORT TERM GOAL #1   Title I with initial HEP   Time 2   Period Weeks   Status Achieved   PT SHORT TERM GOAL #2   Title able to ambulate safely with SPC (07/09/15)   Time 2   Period Weeks   Status Achieved    PT SHORT TERM GOAL #3   Title improved left knee ROM >=  -3 to 110 deg (07/23/15)   Time 4   Period Weeks   Status Achieved  0-110 deg L knee 08/01/2015           PT Long Term Goals - 08/01/15 0820    PT LONG TERM GOAL #1   Title I with advanced hep   Time 8   Period Weeks   Status On-going   PT LONG TERM GOAL #2   Title improved left knee ROM >=  -1 to 125 deg   Time 8   Period Weeks   Status On-going   PT LONG TERM GOAL #3   Title able to ambulate safely without AD   Time 8   Status Achieved   PT LONG TERM GOAL #4   Title demo 4+/5 or better LLE strength to improve function   Time 8   Period Weeks   Status On-going   PT LONG TERM GOAL #5   Title improve FOTO to CK   Time 8   Period Weeks   Status On-going               Plan - 08/06/15 0943    Clinical Impression Statement Patient continues to do well in PT and was able to complete full revolutions on the stationary bike. Tolerated machine strengthening well again today in clinic. AROM of the R knee measured as 112 degrees today. Rpeorts HEP compliance with ITB stretching. Remaining goals considered to be on-going secondary to decreased ROM, strength. Able to ambulate safely without assistive device in clinic. Normal modalities response noted following removal of the modalties. Denied pain following treatment.   Pt will benefit from skilled therapeutic intervention in order to improve on the following deficits Abnormal gait;Pain;Decreased activity tolerance;Decreased range of motion;Decreased strength;Increased edema   Rehab Potential Excellent   PT Frequency 3x / week   PT Duration 8 weeks   PT Treatment/Interventions ADLs/Self Care Home Management;Cryotherapy;Occupational psychologist;Therapeutic exercise;Balance training;Neuromuscular re-education;Manual techniques;Patient/family education;Passive range of motion;Vasopneumatic Device;Taping   PT Next Visit Plan Continue per MPT POC/ MD  POC/ TKR protocol.    Consulted and Agree with Plan of Care Patient        Problem List Patient Active Problem List   Diagnosis Date Noted  . OSA (obstructive sleep apnea) 03/25/2014  . Chest pain 03/12/2014  .  Abnormal ECG 03/12/2014  . OA (osteoarthritis) of knee 10/22/2013  . Obesity, unspecified 07/02/2013  . Arthritis 07/02/2013  . Genu varus 07/02/2013  . Mouth dryness 07/02/2013  . Benign paroxysmal positional vertigo 07/02/2013  . Unspecified vitamin D deficiency 07/02/2013  . Hypertension 05/04/2013  . Hyperlipidemia 05/04/2013    Wynelle Fanny, PTA 08/06/2015, 9:59 AM  [facility] [address] [place], [place], [address] Phone: [phone]   Fax:  [phone]

## 2015-08-08 ENCOUNTER — Encounter: Payer: Medicare Other | Admitting: Physical Therapy

## 2015-08-11 ENCOUNTER — Ambulatory Visit: Payer: Medicare Other | Admitting: Physical Therapy

## 2015-08-11 ENCOUNTER — Encounter: Payer: Self-pay | Admitting: Physical Therapy

## 2015-08-11 DIAGNOSIS — R609 Edema, unspecified: Secondary | ICD-10-CM | POA: Diagnosis not present

## 2015-08-11 DIAGNOSIS — M25662 Stiffness of left knee, not elsewhere classified: Secondary | ICD-10-CM

## 2015-08-11 DIAGNOSIS — M25562 Pain in left knee: Secondary | ICD-10-CM | POA: Diagnosis not present

## 2015-08-11 DIAGNOSIS — R262 Difficulty in walking, not elsewhere classified: Secondary | ICD-10-CM

## 2015-08-11 NOTE — Therapy (Signed)
[facility] [place], [place], [address] Phone: [phone]   Fax:  [phone]  Physical Therapy Treatment  Patient Details  Name: Bethany [name] MRN: [id number] Date of Birth: 08-19-1951 Referring Provider:  Claretta Fraise, MD  Encounter Date: 08/11/2015      PT End of Session - 08/11/15 0844    Visit Number 19   Number of Visits 24   Date for PT Re-Evaluation 09/18/15   PT Start Time 0813   PT Stop Time 0911   PT Time Calculation (min) 58 min   Activity Tolerance Patient tolerated treatment well   Behavior During Therapy [facility] for tasks assessed/performed      Past Medical History  Diagnosis Date  . Hypertension   . Hyperlipidemia   . Allergy 1997    knees  . Obesity   . GERD (gastroesophageal reflux disease)   . Seasonal allergies   . Arthritis   . Sleep apnea     cpap  . Pneumonia     1 month ago - rsolved    Past Surgical History  Procedure Laterality Date  . Abdominal hysterectomy    . Tubal ligation    . Phlebectomy    . Total knee arthroplasty Right 10/22/2013    Procedure: RIGHT TOTAL KNEE ARTHROPLASTY;  Surgeon: Gearlean Alf, MD;  Location: [facility];  Service: Orthopedics;  Laterality: Right;  . Ganglion cyst excision      left  . Breast surgery      fibroid cysts removed  . Total knee arthroplasty Left 06/11/2015    Procedure: LEFT TOTAL KNEE ARTHROPLASTY;  Surgeon: Gaynelle Arabian, MD;  [facility];  Service: Orthopedics;  Laterality: Left;    There were no vitals filed for this visit.  Visit Diagnosis:  Stiffness of left knee  Pain in left knee  Edema  Difficulty walking      Subjective Assessment - 08/11/15 0832    Subjective knee feels about the same, no assistive device needed   Limitations Walking;House hold activities   How long can you sit comfortably? Pt. states she does not have significant difficulty sitting for prolonged periods of time.   How long can you stand  comfortably? Pt. states she can stand for prolonged periods of time.   How long can you walk comfortably? Walked 5 min total comfortably   Patient Stated Goals Pt. would like to resume all functional activities without limitations in left knee.   Currently in Pain? Yes   Pain Score 1    Pain Location Knee   Pain Orientation Left   Pain Descriptors / Indicators Sore   Pain Type Surgical pain   Pain Onset More than a month ago   Aggravating Factors  ROM activities   Pain Relieving Factors rest            [facility] PT Assessment - 08/11/15 0001    ROM / Strength   AROM / PROM / Strength AROM   AROM   Overall AROM  Deficits;Within functional limits for tasks performed   AROM Assessment Site Knee   Right/Left Knee Left   Left Knee Extension 0   Left Knee Flexion 107   PROM   Overall PROM  Deficits   PROM Assessment Site Knee   Right/Left Knee Left   Left Knee Extension 0   Left Knee Flexion 117  [facility] Adult PT Treatment/Exercise - 08/11/15 0001    Knee/Hip Exercises: Stretches   Knee: Self-Stretch to increase Flexion Left;3 reps;30 seconds   Knee/Hip Exercises: Aerobic   Stationary Bike 81min   Nustep L5 x33min   Knee/Hip Exercises: Machines for Strengthening   Cybex Knee Extension 10# 3x10 reps   Cybex Knee Flexion 20# 3 x10 reps   Knee/Hip Exercises: Standing   Rocker Board 3 minutes   Electrical Stimulation   Electrical Stimulation Location L knee   Electrical Stimulation Action premod   Electrical Stimulation Parameters 1-10hz    Electrical Stimulation Goals Pain   Vasopneumatic   Number Minutes Vasopneumatic  15 minutes   Vasopnuematic Location  Knee   Vasopneumatic Pressure Medium   Manual Therapy   Manual Therapy Passive ROM   Passive ROM L knee into flex/ext with long holds at end range                  PT Short Term Goals - 08/01/15 5573    PT SHORT TERM GOAL #1   Title I with initial HEP   Time 2   Period Weeks    Status Achieved   PT SHORT TERM GOAL #2   Title able to ambulate safely with SPC (07/09/15)   Time 2   Period Weeks   Status Achieved   PT SHORT TERM GOAL #3   Title improved left knee ROM >=  -3 to 110 deg (07/23/15)   Time 4   Period Weeks   Status Achieved  0-110 deg L knee 08/01/2015           PT Long Term Goals - 08/01/15 0820    PT LONG TERM GOAL #1   Title I with advanced hep   Time 8   Period Weeks   Status On-going   PT LONG TERM GOAL #2   Title improved left knee ROM >=  -1 to 125 deg   Time 8   Period Weeks   Status On-going   PT LONG TERM GOAL #3   Title able to ambulate safely without AD   Time 8   Status Achieved   PT LONG TERM GOAL #4   Title demo 4+/5 or better LLE strength to improve function   Time 8   Period Weeks   Status On-going   PT LONG TERM GOAL #5   Title improve FOTO to CK   Time 8   Period Weeks   Status On-going               Plan - 08/11/15 0845    Clinical Impression Statement Patient continues to progress with all activities today. No assistive device needed, no pain increase and improved ROM in left knee. Patient has MD appt next week. Goals ongoing with LTG's due to strength and ROM deficits.   Pt will benefit from skilled therapeutic intervention in order to improve on the following deficits Abnormal gait;Pain;Decreased activity tolerance;Decreased range of motion;Decreased strength;Increased edema   Rehab Potential Excellent   PT Frequency 3x / week   PT Duration 8 weeks   PT Treatment/Interventions ADLs/Self Care Home Management;Cryotherapy;Occupational psychologist;Therapeutic exercise;Balance training;Neuromuscular re-education;Manual techniques;Patient/family education;Passive range of motion;Vasopneumatic Device;Taping   PT Next Visit Plan Continue per MPT POC/ MD POC/ TKR protocol. (MD.Alusio 08/19/15)   Consulted and Agree with Plan of Care Patient        Problem List Patient Active  Problem List   Diagnosis Date Noted  . OSA (obstructive sleep  apnea) 03/25/2014  . Chest pain 03/12/2014  . Abnormal ECG 03/12/2014  . OA (osteoarthritis) of knee 10/22/2013  . Obesity, unspecified 07/02/2013  . Arthritis 07/02/2013  . Genu varus 07/02/2013  . Mouth dryness 07/02/2013  . Benign paroxysmal positional vertigo 07/02/2013  . Unspecified vitamin D deficiency 07/02/2013  . Hypertension 05/04/2013  . Hyperlipidemia 05/04/2013    Phillips Climes, PTA 08/11/2015, 9:16 AM  [facility] [address] [place], [place], [address] Phone: [phone]   Fax:  [phone]

## 2015-08-13 ENCOUNTER — Ambulatory Visit: Payer: Medicare Other | Admitting: Physical Therapy

## 2015-08-13 ENCOUNTER — Encounter: Payer: Self-pay | Admitting: Physical Therapy

## 2015-08-13 DIAGNOSIS — R262 Difficulty in walking, not elsewhere classified: Secondary | ICD-10-CM

## 2015-08-13 DIAGNOSIS — M25562 Pain in left knee: Secondary | ICD-10-CM | POA: Diagnosis not present

## 2015-08-13 DIAGNOSIS — M25662 Stiffness of left knee, not elsewhere classified: Secondary | ICD-10-CM

## 2015-08-13 DIAGNOSIS — R609 Edema, unspecified: Secondary | ICD-10-CM

## 2015-08-13 NOTE — Patient Instructions (Signed)
  Step-Down / Step-Up   Stand on stair step or __6__ inch stool. Slowly bend left leg, lowering other foot to floor. Return by straightening front leg. Repeat __10__ times per set. Do __2-3__ sets per session. Do __2-3__ sessions per day.   Strengthening: Hip Abduction (Side-Lying)  Strengthening: Straight Leg Raise (Phase 1)  Repeat _10___ times per set. Do __2__ sets per session. Do __2__ sessions per day.    Straight Leg Raise   Tighten stomach and slowly raise locked right leg __4__ inches from floor. Repeat __10-30__ times per set. Do __2__ sets per session. Do __2__ sessions per day.

## 2015-08-13 NOTE — Therapy (Signed)
[facility] [place], [place], [address] Phone: [phone]   Fax:  [phone]  Physical Therapy Treatment  Patient Details  Name: Bethany [name] MRN: [id number] Date of Birth: 1951-10-15 Referring Provider:  Claretta Fraise, MD  Encounter Date: 08/13/2015      PT End of Session - 08/13/15 0806    Visit Number 20   Number of Visits 24   Date for PT Re-Evaluation 09/18/15   PT Start Time 0728   PT Stop Time 0826   PT Time Calculation (min) 58 min   Activity Tolerance Patient tolerated treatment well   Behavior During Therapy [facility] for tasks assessed/performed      Past Medical History  Diagnosis Date  . Hypertension   . Hyperlipidemia   . Allergy 1997    knees  . Obesity   . GERD (gastroesophageal reflux disease)   . Seasonal allergies   . Arthritis   . Sleep apnea     cpap  . Pneumonia     1 month ago - rsolved    Past Surgical History  Procedure Laterality Date  . Abdominal hysterectomy    . Tubal ligation    . Phlebectomy    . Total knee arthroplasty Right 10/22/2013    Procedure: RIGHT TOTAL KNEE ARTHROPLASTY;  Surgeon: Gearlean Alf, MD;  Location: [facility];  Service: Orthopedics;  Laterality: Right;  . Ganglion cyst excision      left  . Breast surgery      fibroid cysts removed  . Total knee arthroplasty Left 06/11/2015    Procedure: LEFT TOTAL KNEE ARTHROPLASTY;  Surgeon: Gaynelle Arabian, MD;  Location: [facility];  Service: Orthopedics;  Laterality: Left;    There were no vitals filed for this visit.  Visit Diagnosis:  Stiffness of left knee  Pain in left knee  Edema  Difficulty walking      Subjective Assessment - 08/13/15 0730    Subjective knee feels about the same, no assistive device needed   Limitations Walking;House hold activities   How long can you sit comfortably? Pt. states she does not have significant difficulty sitting for prolonged periods of time.   How long can you stand  comfortably? Pt. states she can stand for prolonged periods of time.   How long can you walk comfortably? Walked 5 min total comfortably   Patient Stated Goals Pt. would like to resume all functional activities without limitations in left knee.   Currently in Pain? Yes   Pain Score 1    Pain Location Knee   Pain Orientation Left   Pain Descriptors / Indicators Sore   Pain Type Surgical pain   Pain Onset More than a month ago   Pain Frequency Intermittent   Aggravating Factors  prolon activity or ROM in knee   Pain Relieving Factors rest            [facility] PT Assessment - 08/13/15 0001    AROM   Overall AROM  Deficits;Within functional limits for tasks performed   AROM Assessment Site Knee   Right/Left Knee Left   Left Knee Extension 0   Left Knee Flexion 107   PROM   Overall PROM  Deficits;Within functional limits for tasks performed   PROM Assessment Site Knee   Right/Left Knee Left   Left Knee Extension 0   Left Knee Flexion 117  [facility] Adult PT Treatment/Exercise - 08/13/15 0001    Knee/Hip Exercises: Stretches   Knee: Self-Stretch to increase Flexion Left;3 reps;30 seconds   Knee/Hip Exercises: Aerobic   Stationary Bike 23min   Nustep L5 x45min   Knee/Hip Exercises: Machines for Strengthening   Cybex Knee Extension 10# 3x10 reps   Cybex Knee Flexion 30# 3 x10 reps   Knee/Hip Exercises: Standing   Rocker Board 3 minutes   Electrical Stimulation   Electrical Stimulation Location L knee   Electrical Stimulation Action premod   Electrical Stimulation Parameters 1-10hz    Electrical Stimulation Goals Pain   Vasopneumatic   Number Minutes Vasopneumatic  15 minutes   Vasopnuematic Location  Knee   Vasopneumatic Pressure Medium   Manual Therapy   Manual Therapy Passive ROM   Passive ROM L knee into flex/ext with long holds at end range                PT Education - 08/13/15 0815    Education Details HEP strength   Person(s)  Educated Patient   Methods Explanation;Demonstration;Handout   Comprehension Verbalized understanding;Returned demonstration          PT Short Term Goals - 08/01/15 0819    PT SHORT TERM GOAL #1   Title I with initial HEP   Time 2   Period Weeks   Status Achieved   PT SHORT TERM GOAL #2   Title able to ambulate safely with SPC (07/09/15)   Time 2   Period Weeks   Status Achieved   PT SHORT TERM GOAL #3   Title improved left knee ROM >=  -3 to 110 deg (07/23/15)   Time 4   Period Weeks   Status Achieved  0-110 deg L knee 08/01/2015           PT Long Term Goals - 08/13/15 0809    PT LONG TERM GOAL #1   Title I with advanced hep   Time 8   Period Weeks   Status Achieved   PT LONG TERM GOAL #2   Title improved left knee ROM >=  -1 to 125 deg   Time 8   Period Weeks   Status On-going   PT LONG TERM GOAL #3   Title able to ambulate safely without AD   Time 8   Period Weeks   Status Achieved   PT LONG TERM GOAL #4   Title demo 4+/5 or better LLE strength to improve function   Period Weeks   Status On-going   PT LONG TERM GOAL #5   Title improve FOTO to CK   Time 8   Period Weeks   Status Achieved               Plan - 08/13/15 0807    Clinical Impression Statement Patient progressing with all activities yet still has some difficulty with flexion ROM. Patient was given HEP for continued strength. Patient has met all current goals except strength and ROM due to limitations. 20th visit FOTO 51% limitation (initial 76%)   Pt will benefit from skilled therapeutic intervention in order to improve on the following deficits Abnormal gait;Pain;Decreased activity tolerance;Decreased range of motion;Decreased strength;Increased edema   Rehab Potential Excellent   PT Frequency 3x / week   PT Duration 8 weeks   PT Treatment/Interventions ADLs/Self Care Home Management;Cryotherapy;Occupational psychologist;Therapeutic exercise;Balance  training;Neuromuscular re-education;Manual techniques;Patient/family education;Passive range of motion;Vasopneumatic Device;Taping   PT Next Visit Plan Continue per MPT POC/ MD POC/  TKR protocol. (MD.Alusio 08/19/15)   Consulted and Agree with Plan of Care Patient        Problem List Patient Active Problem List   Diagnosis Date Noted  . OSA (obstructive sleep apnea) 03/25/2014  . Chest pain 03/12/2014  . Abnormal ECG 03/12/2014  . OA (osteoarthritis) of knee 10/22/2013  . Obesity, unspecified 07/02/2013  . Arthritis 07/02/2013  . Genu varus 07/02/2013  . Mouth dryness 07/02/2013  . Benign paroxysmal positional vertigo 07/02/2013  . Unspecified vitamin D deficiency 07/02/2013  . Hypertension 05/04/2013  . Hyperlipidemia 05/04/2013    Phillips Climes, PTA 08/13/2015, 9:01 AM  [facility] [address] [place], [place], [address] Phone: [phone]   Fax:  [phone]

## 2015-08-15 ENCOUNTER — Encounter: Payer: Self-pay | Admitting: Physical Therapy

## 2015-08-15 ENCOUNTER — Ambulatory Visit: Payer: Medicare Other | Admitting: Physical Therapy

## 2015-08-15 DIAGNOSIS — R262 Difficulty in walking, not elsewhere classified: Secondary | ICD-10-CM | POA: Diagnosis not present

## 2015-08-15 DIAGNOSIS — R609 Edema, unspecified: Secondary | ICD-10-CM | POA: Diagnosis not present

## 2015-08-15 DIAGNOSIS — M25562 Pain in left knee: Secondary | ICD-10-CM

## 2015-08-15 DIAGNOSIS — M25662 Stiffness of left knee, not elsewhere classified: Secondary | ICD-10-CM

## 2015-08-15 NOTE — Therapy (Signed)
[facility] [place], [place], [address] Phone: [phone]   Fax:  [phone]  Physical Therapy Treatment  Patient Details  Name: Bethany [name] MRN: [id number] Date of Birth: [birth year] Referring Provider:  Claretta Fraise, MD  Encounter Date: 08/15/2015    Past Medical History  Diagnosis Date  . Hypertension   . Hyperlipidemia   . Allergy 1997    knees  . Obesity   . GERD (gastroesophageal reflux disease)   . Seasonal allergies   . Arthritis   . Sleep apnea     cpap  . Pneumonia     1 month ago - rsolved    Past Surgical History  Procedure Laterality Date  . Abdominal hysterectomy    . Tubal ligation    . Phlebectomy    . Total knee arthroplasty Right 10/22/2013    Procedure: RIGHT TOTAL KNEE ARTHROPLASTY;  Surgeon: Gearlean Alf, MD;  Location: [facility];  Service: Orthopedics;  Laterality: Right;  . Ganglion cyst excision      left  . Breast surgery      fibroid cysts removed  . Total knee arthroplasty Left 06/11/2015    Procedure: LEFT TOTAL KNEE ARTHROPLASTY;  Surgeon: Gaynelle Arabian, MD;  Location: [facility];  Service: Orthopedics;  Laterality: Left;    There were no vitals filed for this visit.  Visit Diagnosis:  Stiffness of left knee  Pain in left knee  Edema  Difficulty walking                                 PT Short Term Goals - 08/01/15 0350    PT SHORT TERM GOAL #1   Title I with initial HEP   Time 2   Period Weeks   Status Achieved   PT SHORT TERM GOAL #2   Title able to ambulate safely with SPC (07/09/15)   Time 2   Period Weeks   Status Achieved   PT SHORT TERM GOAL #3   Title improved left knee ROM >=  -3 to 110 deg (07/23/15)   Time 4   Period Weeks   Status Achieved  0-110 deg L knee 08/01/2015           PT Long Term Goals - 08/15/15 0810    PT LONG TERM GOAL #1   Title I with advanced hep   Time 8   Period Weeks   Status Achieved   PT  LONG TERM GOAL #2   Title improved left knee ROM >=  -1 to 125 deg   Time 8   Period Weeks   Status On-going  0-110   PT LONG TERM GOAL #3   Title able to ambulate safely without AD   Time 8   Period Weeks   Status Achieved   PT LONG TERM GOAL #4   Title demo 4+/5 or better LLE strength to improve function   Time 8   Period Weeks   Status Achieved   PT LONG TERM GOAL #5   Title improve FOTO to CK   Time 8   Period Weeks   Status Achieved               Problem List Patient Active Problem List   Diagnosis Date Noted  . OSA (obstructive sleep apnea) 03/25/2014  . Chest pain 03/12/2014  . Abnormal ECG 03/12/2014  . OA (osteoarthritis) of knee 10/22/2013  .  Obesity, unspecified 07/02/2013  . Arthritis 07/02/2013  . Genu varus 07/02/2013  . Mouth dryness 07/02/2013  . Benign paroxysmal positional vertigo 07/02/2013  . Unspecified vitamin D deficiency 07/02/2013  . Hypertension 05/04/2013  . Hyperlipidemia 05/04/2013   Ladean Raya, PTA 08/18/2015 10:20 AM  APPLEGATE, [place], PTA 08/18/2015, 10:20 AM  Mali Applegate MPT [facility] [address] [place], [place], [address] Phone: [phone]   Fax:  [phone]

## 2015-08-19 DIAGNOSIS — Z471 Aftercare following joint replacement surgery: Secondary | ICD-10-CM | POA: Diagnosis not present

## 2015-08-19 DIAGNOSIS — Z96652 Presence of left artificial knee joint: Secondary | ICD-10-CM | POA: Diagnosis not present

## 2015-08-25 DIAGNOSIS — M9904 Segmental and somatic dysfunction of sacral region: Secondary | ICD-10-CM | POA: Diagnosis not present

## 2015-08-25 DIAGNOSIS — E6609 Other obesity due to excess calories: Secondary | ICD-10-CM | POA: Diagnosis not present

## 2015-08-25 DIAGNOSIS — M9905 Segmental and somatic dysfunction of pelvic region: Secondary | ICD-10-CM | POA: Diagnosis not present

## 2015-08-25 DIAGNOSIS — M5030 Other cervical disc degeneration, unspecified cervical region: Secondary | ICD-10-CM | POA: Diagnosis not present

## 2015-08-25 DIAGNOSIS — M9903 Segmental and somatic dysfunction of lumbar region: Secondary | ICD-10-CM | POA: Diagnosis not present

## 2015-08-25 DIAGNOSIS — M9901 Segmental and somatic dysfunction of cervical region: Secondary | ICD-10-CM | POA: Diagnosis not present

## 2015-08-25 DIAGNOSIS — I973 Postprocedural hypertension: Secondary | ICD-10-CM | POA: Diagnosis not present

## 2015-08-25 DIAGNOSIS — M9902 Segmental and somatic dysfunction of thoracic region: Secondary | ICD-10-CM | POA: Diagnosis not present

## 2015-08-27 DIAGNOSIS — I973 Postprocedural hypertension: Secondary | ICD-10-CM | POA: Diagnosis not present

## 2015-08-27 DIAGNOSIS — E6609 Other obesity due to excess calories: Secondary | ICD-10-CM | POA: Diagnosis not present

## 2015-08-27 DIAGNOSIS — M9903 Segmental and somatic dysfunction of lumbar region: Secondary | ICD-10-CM | POA: Diagnosis not present

## 2015-08-27 DIAGNOSIS — M9905 Segmental and somatic dysfunction of pelvic region: Secondary | ICD-10-CM | POA: Diagnosis not present

## 2015-08-27 DIAGNOSIS — M9901 Segmental and somatic dysfunction of cervical region: Secondary | ICD-10-CM | POA: Diagnosis not present

## 2015-08-27 DIAGNOSIS — M5030 Other cervical disc degeneration, unspecified cervical region: Secondary | ICD-10-CM | POA: Diagnosis not present

## 2015-08-27 DIAGNOSIS — M9902 Segmental and somatic dysfunction of thoracic region: Secondary | ICD-10-CM | POA: Diagnosis not present

## 2015-08-27 DIAGNOSIS — M9904 Segmental and somatic dysfunction of sacral region: Secondary | ICD-10-CM | POA: Diagnosis not present

## 2015-09-02 ENCOUNTER — Other Ambulatory Visit: Payer: Self-pay | Admitting: Family Medicine

## 2015-09-02 DIAGNOSIS — E6609 Other obesity due to excess calories: Secondary | ICD-10-CM | POA: Diagnosis not present

## 2015-09-02 DIAGNOSIS — M9903 Segmental and somatic dysfunction of lumbar region: Secondary | ICD-10-CM | POA: Diagnosis not present

## 2015-09-02 DIAGNOSIS — M9902 Segmental and somatic dysfunction of thoracic region: Secondary | ICD-10-CM | POA: Diagnosis not present

## 2015-09-02 DIAGNOSIS — M5030 Other cervical disc degeneration, unspecified cervical region: Secondary | ICD-10-CM | POA: Diagnosis not present

## 2015-09-02 DIAGNOSIS — M9904 Segmental and somatic dysfunction of sacral region: Secondary | ICD-10-CM | POA: Diagnosis not present

## 2015-09-02 DIAGNOSIS — M9905 Segmental and somatic dysfunction of pelvic region: Secondary | ICD-10-CM | POA: Diagnosis not present

## 2015-09-02 DIAGNOSIS — J069 Acute upper respiratory infection, unspecified: Secondary | ICD-10-CM

## 2015-09-02 DIAGNOSIS — I973 Postprocedural hypertension: Secondary | ICD-10-CM | POA: Diagnosis not present

## 2015-09-02 DIAGNOSIS — M9901 Segmental and somatic dysfunction of cervical region: Secondary | ICD-10-CM | POA: Diagnosis not present

## 2015-09-02 MED ORDER — OMEPRAZOLE 20 MG PO CPDR: 20.0000 mg | DELAYED_RELEASE_CAPSULE | Freq: Every day | ORAL | Status: DC

## 2015-09-02 MED ORDER — PRAVASTATIN SODIUM 40 MG PO TABS
40.0000 mg | ORAL_TABLET | Freq: Every morning | ORAL | Status: DC
Start: 2015-09-02 — End: 2015-10-30

## 2015-09-02 MED ORDER — ACYCLOVIR 200 MG PO CAPS: 200.0000 mg | ORAL_CAPSULE | Freq: Every day | ORAL | Status: DC | PRN

## 2015-09-02 MED ORDER — LOSARTAN POTASSIUM-HCTZ 100-25 MG PO TABS: 1.0000 | ORAL_TABLET | Freq: Every day | ORAL | Status: DC

## 2015-09-02 MED ORDER — MECLIZINE HCL 25 MG PO TABS: 25.0000 mg | ORAL_TABLET | Freq: Three times a day (TID) | ORAL | Status: DC | PRN

## 2015-09-02 NOTE — Telephone Encounter (Signed)
done

## 2015-09-09 DIAGNOSIS — H2513 Age-related nuclear cataract, bilateral: Secondary | ICD-10-CM | POA: Diagnosis not present

## 2015-09-09 DIAGNOSIS — H16142 Punctate keratitis, left eye: Secondary | ICD-10-CM | POA: Diagnosis not present

## 2015-09-09 DIAGNOSIS — H04123 Dry eye syndrome of bilateral lacrimal glands: Secondary | ICD-10-CM | POA: Diagnosis not present

## 2015-09-12 DIAGNOSIS — H2513 Age-related nuclear cataract, bilateral: Secondary | ICD-10-CM | POA: Diagnosis not present

## 2015-09-12 DIAGNOSIS — H16142 Punctate keratitis, left eye: Secondary | ICD-10-CM | POA: Diagnosis not present

## 2015-09-12 DIAGNOSIS — H04123 Dry eye syndrome of bilateral lacrimal glands: Secondary | ICD-10-CM | POA: Diagnosis not present

## 2015-10-01 ENCOUNTER — Ambulatory Visit (INDEPENDENT_AMBULATORY_CARE_PROVIDER_SITE_OTHER): Payer: Medicare Other

## 2015-10-01 DIAGNOSIS — Z23 Encounter for immunization: Secondary | ICD-10-CM

## 2015-10-01 NOTE — Progress Notes (Signed)
Pt here today for flu vaccine and Prevnar 13. [facility] is currently down so we are unable to verify if pt has had pneumonia vaccine before, pt states she is 100% sure she has never had a pneumonia vaccine but is aware if she has and insurance doesn't cover she will be responsible for the bill which is roughly $265. Pt given Flu and Prevnar 13 and tolerated well.

## 2015-10-01 NOTE — Addendum Note (Signed)
Addended by: Marylin Crosby on: 10/01/2015 11:19 AM   Modules accepted: Orders

## 2015-10-14 ENCOUNTER — Encounter: Payer: Self-pay | Admitting: Family Medicine

## 2015-10-14 DIAGNOSIS — H43811 Vitreous degeneration, right eye: Secondary | ICD-10-CM | POA: Diagnosis not present

## 2015-10-14 DIAGNOSIS — H04123 Dry eye syndrome of bilateral lacrimal glands: Secondary | ICD-10-CM | POA: Diagnosis not present

## 2015-10-14 DIAGNOSIS — H43393 Other vitreous opacities, bilateral: Secondary | ICD-10-CM | POA: Diagnosis not present

## 2015-10-14 DIAGNOSIS — H25813 Combined forms of age-related cataract, bilateral: Secondary | ICD-10-CM | POA: Diagnosis not present

## 2015-10-30 ENCOUNTER — Ambulatory Visit (INDEPENDENT_AMBULATORY_CARE_PROVIDER_SITE_OTHER): Payer: Medicare Other | Admitting: Family Medicine

## 2015-10-30 ENCOUNTER — Encounter: Payer: Self-pay | Admitting: Family Medicine

## 2015-10-30 ENCOUNTER — Ambulatory Visit (INDEPENDENT_AMBULATORY_CARE_PROVIDER_SITE_OTHER): Payer: Medicare Other

## 2015-10-30 VITALS — BP 144/89 | HR 68 | Temp 98.2°F | Ht 65.0 in

## 2015-10-30 DIAGNOSIS — E785 Hyperlipidemia, unspecified: Secondary | ICD-10-CM

## 2015-10-30 DIAGNOSIS — E669 Obesity, unspecified: Secondary | ICD-10-CM

## 2015-10-30 DIAGNOSIS — I10 Essential (primary) hypertension: Secondary | ICD-10-CM | POA: Diagnosis not present

## 2015-10-30 DIAGNOSIS — Z780 Asymptomatic menopausal state: Secondary | ICD-10-CM | POA: Diagnosis not present

## 2015-10-30 MED ORDER — VALSARTAN-HYDROCHLOROTHIAZIDE 160-25 MG PO TABS: 1.0000 | ORAL_TABLET | Freq: Every day | ORAL | Status: DC

## 2015-10-30 MED ORDER — PRAVASTATIN SODIUM 40 MG PO TABS: 40.0000 mg | ORAL_TABLET | Freq: Every morning | ORAL | Status: DC

## 2015-10-30 NOTE — Progress Notes (Signed)
Subjective:  Patient ID: Bethany [name], female    DOB: 11/25/1951  Age: 64 y.o. MRN: [id number]  CC: Hypertension and Hyperlipidemia   HPI Bethany [name] presents for Patient in for follow-up of elevated cholesterol. Doing well without complaints on current medication. Denies side effects of statin including myalgia and arthralgia and nausea. Also in today for liver function testing. Currently no chest pain, shortness of breath or other cardiovascular related symptoms noted.   follow-up of hypertension. Patient has no history of headache chest pain or shortness of breath or recent cough. Patient also denies symptoms of TIA such as numbness weakness lateralizing. Patient checks  blood pressure at home and has not had any elevated readings recently. Patient denies side effects from his medication. States taking it regularly.   History Bethany [name] has a past medical history of Hypertension; Hyperlipidemia; Allergy (1997); Obesity; GERD (gastroesophageal reflux disease); Seasonal allergies; Arthritis; Sleep apnea; and Pneumonia.   She has past surgical history that includes Abdominal hysterectomy; Tubal ligation; phlebectomy; Total knee arthroplasty (Right, 10/22/2013); Ganglion cyst excision; Breast surgery; and Total knee arthroplasty (Left, 06/11/2015).   Her family history includes Arthritis in her brother; Diabetes in her sister; Heart disease in her mother and sister.She reports that she has never smoked. She has never used smokeless tobacco. She reports that she does not drink alcohol or use illicit drugs.  Outpatient Prescriptions Prior to Visit  Medication Sig Dispense Refill  . acyclovir (ZOVIRAX) 200 MG capsule Take 1 capsule (200 mg total) by mouth daily as needed (takes once daily as needed for flareups). 90 capsule 0  . meclizine (ANTIVERT) 25 MG tablet Take 1 tablet (25 mg total) by mouth 3 (three) times daily as needed for dizziness. 270 tablet 0  . omeprazole (PRILOSEC) 20 MG  capsule Take 1 capsule (20 mg total) by mouth daily. 90 capsule 1  . losartan-hydrochlorothiazide (HYZAAR) 100-25 MG per tablet Take 1 tablet by mouth daily. 90 tablet 0  . pravastatin (PRAVACHOL) 40 MG tablet Take 1 tablet (40 mg total) by mouth every morning. 90 tablet 0  . methocarbamol (ROBAXIN) 500 MG tablet Take 1 tablet (500 mg total) by mouth every 6 (six) hours as needed for muscle spasms. (Patient not taking: Reported on 10/30/2015) 40 tablet 1  . oxyCODONE (OXY IR/ROXICODONE) 5 MG immediate release tablet Take 1-2 tablets (5-10 mg total) by mouth every 3 (three) hours as needed for breakthrough pain. (Patient not taking: Reported on 10/30/2015) 60 tablet 0  . rivaroxaban (XARELTO) 10 MG TABS tablet Take 1 tablet (10 mg total) by mouth daily with breakfast. (Patient not taking: Reported on 10/30/2015) 20 tablet 0  . traMADol (ULTRAM) 50 MG tablet Take 1-2 tablets (50-100 mg total) by mouth every 6 (six) hours as needed for moderate pain. (Patient not taking: Reported on 10/30/2015) 60 tablet 0   No facility-administered medications prior to visit.    ROS Review of Systems  Constitutional: Negative for fever, chills, diaphoresis, appetite change, fatigue and unexpected weight change.  HENT: Negative for congestion, ear pain, hearing loss, postnasal drip, rhinorrhea, sneezing, sore throat and trouble swallowing.   Eyes: Negative for pain.  Respiratory: Negative for cough, chest tightness and shortness of breath.   Cardiovascular: Negative for chest pain and palpitations.  Gastrointestinal: Negative for nausea, vomiting, abdominal pain, diarrhea and constipation.  Genitourinary: Negative for dysuria, frequency and menstrual problem.  Musculoskeletal: Negative for joint swelling and arthralgias.  Skin: Negative for rash.  Neurological: Negative for dizziness, weakness, numbness and  headaches.  Psychiatric/Behavioral: Negative for dysphoric mood and agitation.    Objective:  BP 144/89  mmHg  Pulse 68  Temp(Src) 98.2 F (36.8 C) (Oral)  Ht _0  (1.651 m)  SpO2 98%  BP Readings from Last 3 Encounters:  10/30/15 144/89  06/13/15 151/82  05/14/15 114/75    Wt Readings from Last 3 Encounters:  06/11/15 235 lb (106.595 kg)  05/14/15 235 lb (106.595 kg)  05/06/15 237 lb (107.502 kg)     Physical Exam  Constitutional: She is oriented to person, place, and time. She appears well-developed and well-nourished. No distress.  HENT:  Head: Normocephalic and atraumatic.  Right Ear: External ear normal.  Left Ear: External ear normal.  Nose: Nose normal.  Mouth/Throat: Oropharynx is clear and moist.  Eyes: Conjunctivae and EOM are normal. Pupils are equal, round, and reactive to light.  Neck: Normal range of motion. Neck supple. No thyromegaly present.  Cardiovascular: Normal rate, regular rhythm and normal heart sounds.   No murmur heard. Pulmonary/Chest: Effort normal and breath sounds normal. No respiratory distress. She has no wheezes. She has no rales.  Abdominal: Soft. Bowel sounds are normal. She exhibits no distension. There is no tenderness.  Lymphadenopathy:    She has no cervical adenopathy.  Neurological: She is alert and oriented to person, place, and time. She has normal reflexes.  Skin: Skin is warm and dry.  Psychiatric: She has a normal mood and affect. Her behavior is normal. Judgment and thought content normal.    No results found for: HGBA1C  Lab Results  Component Value Date   WBC 16.4* 06/13/2015   HGB 10.5* 06/13/2015   HCT 31.7* 06/13/2015   PLT 270 06/13/2015   GLUCOSE 87 10/30/2015   CHOL 170 10/30/2015   TRIG 95 10/30/2015   HDL 50 10/30/2015   LDLCALC 101* 10/30/2015   ALT 12 10/30/2015   AST 20 10/30/2015   NA 143 10/30/2015   K 4.3 10/30/2015   CL 104 10/30/2015   CREATININE 0.69 10/30/2015   BUN 6* 10/30/2015   CO2 26 10/30/2015   INR 0.99 05/14/2015    No results found.  Assessment & Plan:   Bethany [name] was seen  today for hypertension and hyperlipidemia.  Diagnoses and all orders for this visit:  Essential hypertension -     CMP14+EGFR  Hyperlipidemia -     CMP14+EGFR -     Lipid panel  Obesity, unspecified -     CMP14+EGFR  Postmenopausal -     CMP14+EGFR -     DG Bone Density; Future  Other orders -     valsartan-hydrochlorothiazide (DIOVAN HCT) 160-25 MG tablet; Take 1 tablet by mouth daily. -     pravastatin (PRAVACHOL) 40 MG tablet; Take 1 tablet (40 mg total) by mouth every morning.  I have discontinued Ms. Pallett's oxyCODONE, traMADol, rivaroxaban, methocarbamol, and losartan-hydrochlorothiazide. I am also having her start on valsartan-hydrochlorothiazide. Additionally, I am having her maintain her acyclovir, meclizine, omeprazole, and pravastatin.  Meds ordered this encounter  Medications  . valsartan-hydrochlorothiazide (DIOVAN HCT) 160-25 MG tablet    Sig: Take 1 tablet by mouth daily.    Dispense:  30 tablet    Refill:  5  . pravastatin (PRAVACHOL) 40 MG tablet    Sig: Take 1 tablet (40 mg total) by mouth every morning.    Dispense:  90 tablet    Refill:  1     Follow-up: Return in about 6 months (around 04/28/2016) for CPE,  hypertension, cholesterol.  Claretta Fraise, M.D.

## 2015-10-31 LAB — CMP14+EGFR
A/G RATIO: 1.6 (ref 1.1–2.5)
ALBUMIN: 4.3 g/dL (ref 3.6–4.8)
ALT: 12 IU/L (ref 0–32)
AST: 20 IU/L (ref 0–40)
Alkaline Phosphatase: 100 IU/L (ref 39–117)
BUN / CREAT RATIO: 9 — AB (ref 11–26)
BUN: 6 mg/dL — ABNORMAL LOW (ref 8–27)
Bilirubin Total: 0.7 mg/dL (ref 0.0–1.2)
CO2: 26 mmol/L (ref 18–29)
Calcium: 9.5 mg/dL (ref 8.7–10.3)
Chloride: 104 mmol/L (ref 97–106)
Creatinine, Ser: 0.69 mg/dL (ref 0.57–1.00)
GFR calc non Af Amer: 92 mL/min/{1.73_m2} (ref >59)
GFR, EST AFRICAN AMERICAN: 106 mL/min/{1.73_m2} (ref >59)
Globulin, Total: 2.7 g/dL (ref 1.5–4.5)
Glucose: 87 mg/dL (ref 65–99)
POTASSIUM: 4.3 mmol/L (ref 3.5–5.2)
Sodium: 143 mmol/L (ref 136–144)
Total Protein: 7 g/dL (ref 6.0–8.5)

## 2015-10-31 LAB — LIPID PANEL
CHOLESTEROL TOTAL: 170 mg/dL (ref 100–199)
Chol/HDL Ratio: 3.4 ratio units (ref 0.0–4.4)
HDL: 50 mg/dL (ref >39)
LDL Calculated: 101 mg/dL — ABNORMAL HIGH (ref 0–99)
Triglycerides: 95 mg/dL (ref 0–149)
VLDL Cholesterol Cal: 19 mg/dL (ref 5–40)

## 2015-11-03 ENCOUNTER — Other Ambulatory Visit: Payer: Self-pay | Admitting: Family Medicine

## 2015-11-11 ENCOUNTER — Other Ambulatory Visit: Payer: Self-pay | Admitting: Family Medicine

## 2015-11-11 DIAGNOSIS — Z471 Aftercare following joint replacement surgery: Secondary | ICD-10-CM | POA: Diagnosis not present

## 2015-11-11 DIAGNOSIS — Z96652 Presence of left artificial knee joint: Secondary | ICD-10-CM | POA: Diagnosis not present

## 2015-11-11 MED ORDER — VALSARTAN-HYDROCHLOROTHIAZIDE 160-25 MG PO TABS: 1.0000 | ORAL_TABLET | Freq: Every day | ORAL | Status: DC

## 2015-12-25 DIAGNOSIS — I973 Postprocedural hypertension: Secondary | ICD-10-CM | POA: Diagnosis not present

## 2015-12-25 DIAGNOSIS — M9904 Segmental and somatic dysfunction of sacral region: Secondary | ICD-10-CM | POA: Diagnosis not present

## 2015-12-25 DIAGNOSIS — M9905 Segmental and somatic dysfunction of pelvic region: Secondary | ICD-10-CM | POA: Diagnosis not present

## 2015-12-25 DIAGNOSIS — M9901 Segmental and somatic dysfunction of cervical region: Secondary | ICD-10-CM | POA: Diagnosis not present

## 2015-12-25 DIAGNOSIS — M9903 Segmental and somatic dysfunction of lumbar region: Secondary | ICD-10-CM | POA: Diagnosis not present

## 2015-12-25 DIAGNOSIS — M62838 Other muscle spasm: Secondary | ICD-10-CM | POA: Diagnosis not present

## 2015-12-25 DIAGNOSIS — E6609 Other obesity due to excess calories: Secondary | ICD-10-CM | POA: Diagnosis not present

## 2015-12-25 DIAGNOSIS — M9902 Segmental and somatic dysfunction of thoracic region: Secondary | ICD-10-CM | POA: Diagnosis not present

## 2015-12-30 DIAGNOSIS — M62838 Other muscle spasm: Secondary | ICD-10-CM | POA: Diagnosis not present

## 2015-12-30 DIAGNOSIS — M9901 Segmental and somatic dysfunction of cervical region: Secondary | ICD-10-CM | POA: Diagnosis not present

## 2015-12-30 DIAGNOSIS — M9903 Segmental and somatic dysfunction of lumbar region: Secondary | ICD-10-CM | POA: Diagnosis not present

## 2015-12-30 DIAGNOSIS — M9905 Segmental and somatic dysfunction of pelvic region: Secondary | ICD-10-CM | POA: Diagnosis not present

## 2015-12-30 DIAGNOSIS — E6609 Other obesity due to excess calories: Secondary | ICD-10-CM | POA: Diagnosis not present

## 2015-12-30 DIAGNOSIS — I973 Postprocedural hypertension: Secondary | ICD-10-CM | POA: Diagnosis not present

## 2015-12-30 DIAGNOSIS — M9904 Segmental and somatic dysfunction of sacral region: Secondary | ICD-10-CM | POA: Diagnosis not present

## 2015-12-30 DIAGNOSIS — M9902 Segmental and somatic dysfunction of thoracic region: Secondary | ICD-10-CM | POA: Diagnosis not present

## 2016-01-05 ENCOUNTER — Other Ambulatory Visit: Payer: Self-pay | Admitting: Family Medicine

## 2016-01-13 DIAGNOSIS — M9904 Segmental and somatic dysfunction of sacral region: Secondary | ICD-10-CM | POA: Diagnosis not present

## 2016-01-13 DIAGNOSIS — M9905 Segmental and somatic dysfunction of pelvic region: Secondary | ICD-10-CM | POA: Diagnosis not present

## 2016-01-13 DIAGNOSIS — M9903 Segmental and somatic dysfunction of lumbar region: Secondary | ICD-10-CM | POA: Diagnosis not present

## 2016-01-13 DIAGNOSIS — E6609 Other obesity due to excess calories: Secondary | ICD-10-CM | POA: Diagnosis not present

## 2016-01-13 DIAGNOSIS — M5137 Other intervertebral disc degeneration, lumbosacral region: Secondary | ICD-10-CM | POA: Diagnosis not present

## 2016-01-13 DIAGNOSIS — M9901 Segmental and somatic dysfunction of cervical region: Secondary | ICD-10-CM | POA: Diagnosis not present

## 2016-01-13 DIAGNOSIS — M9902 Segmental and somatic dysfunction of thoracic region: Secondary | ICD-10-CM | POA: Diagnosis not present

## 2016-01-13 DIAGNOSIS — I973 Postprocedural hypertension: Secondary | ICD-10-CM | POA: Diagnosis not present

## 2016-01-26 NOTE — Therapy (Signed)
[facility] [place], [place], [address] Phone: [phone]   Fax:  [phone]  Physical Therapy Treatment  Patient Details  Name: Bethany [name] MRN: [id number] Date of Birth: 06-28-1951 No Data Recorded  Encounter Date: 08/15/2015    Past Medical History  Diagnosis Date  . Hypertension   . Hyperlipidemia   . Allergy 1997    knees  . Obesity   . GERD (gastroesophageal reflux disease)   . Seasonal allergies   . Arthritis   . Sleep apnea     cpap  . Pneumonia     1 month ago - rsolved    Past Surgical History  Procedure Laterality Date  . Abdominal hysterectomy    . Tubal ligation    . Phlebectomy    . Total knee arthroplasty Right 10/22/2013    Procedure: RIGHT TOTAL KNEE ARTHROPLASTY;  Surgeon: Gearlean Alf, MD;  Location: [facility];  Service: Orthopedics;  Laterality: Right;  . Ganglion cyst excision      left  . Breast surgery      fibroid cysts removed  . Total knee arthroplasty Left 06/11/2015    Procedure: LEFT TOTAL KNEE ARTHROPLASTY;  Surgeon: Gaynelle Arabian, MD;  Location: [facility];  Service: Orthopedics;  Laterality: Left;    There were no vitals filed for this visit.  Visit Diagnosis:  Stiffness of left knee  Pain in left knee  Edema  Difficulty walking                                 PT Short Term Goals - 08/01/15 5631    PT SHORT TERM GOAL #1   Title I with initial HEP   Time 2   Period Weeks   Status Achieved   PT SHORT TERM GOAL #2   Title able to ambulate safely with SPC (07/09/15)   Time 2   Period Weeks   Status Achieved   PT SHORT TERM GOAL #3   Title improved left knee ROM >=  -3 to 110 deg (07/23/15)   Time 4   Period Weeks   Status Achieved  0-110 deg L knee 08/01/2015           PT Long Term Goals - 08/15/15 0810    PT LONG TERM GOAL #1   Title I with advanced hep   Time 8   Period Weeks   Status Achieved   PT LONG TERM GOAL #2   Title improved left knee ROM >=  -1 to 125 deg   Time 8   Period Weeks   Status On-going  0-110   PT LONG TERM GOAL #3   Title able to ambulate safely without AD   Time 8   Period Weeks   Status Achieved   PT LONG TERM GOAL #4   Title demo 4+/5 or better LLE strength to improve function   Time 8   Period Weeks   Status Achieved   PT LONG TERM GOAL #5   Title improve FOTO to CK   Time 8   Period Weeks   Status Achieved               Problem List Patient Active Problem List   Diagnosis Date Noted  . OSA (obstructive sleep apnea) 03/25/2014  . OA (osteoarthritis) of knee 10/22/2013  . Obesity, unspecified 07/02/2013  . Arthritis 07/02/2013  . Genu varus 07/02/2013  .  Mouth dryness 07/02/2013  . Benign paroxysmal positional vertigo 07/02/2013  . Unspecified vitamin D deficiency 07/02/2013  . Hypertension 05/04/2013  . Hyperlipidemia 05/04/2013   PHYSICAL THERAPY DISCHARGE SUMMARY  Visits from Start of Care: 21  Current functional level related to goals / functional outcomes: Please see above.   Remaining deficits: ROM 0 to 110 degrees.   Education / Equipment: HEP Plan: Patient agrees to discharge.  Patient goals were partially met. Patient is being discharged due to meeting the stated rehab goals.  ?????      Zoanne Newill, [place] MPT 01/26/2016, 5:36 PM  [facility] [address] [place], [place], [address] Phone: [phone]   Fax:  [phone]  Name: Lace Chenevert MRN: [id number] Date of Birth: March 11, 1951

## 2016-01-27 DIAGNOSIS — I973 Postprocedural hypertension: Secondary | ICD-10-CM | POA: Diagnosis not present

## 2016-01-27 DIAGNOSIS — M9905 Segmental and somatic dysfunction of pelvic region: Secondary | ICD-10-CM | POA: Diagnosis not present

## 2016-01-27 DIAGNOSIS — M9902 Segmental and somatic dysfunction of thoracic region: Secondary | ICD-10-CM | POA: Diagnosis not present

## 2016-01-27 DIAGNOSIS — M9903 Segmental and somatic dysfunction of lumbar region: Secondary | ICD-10-CM | POA: Diagnosis not present

## 2016-01-27 DIAGNOSIS — M5137 Other intervertebral disc degeneration, lumbosacral region: Secondary | ICD-10-CM | POA: Diagnosis not present

## 2016-01-27 DIAGNOSIS — M9904 Segmental and somatic dysfunction of sacral region: Secondary | ICD-10-CM | POA: Diagnosis not present

## 2016-01-27 DIAGNOSIS — E6609 Other obesity due to excess calories: Secondary | ICD-10-CM | POA: Diagnosis not present

## 2016-01-27 DIAGNOSIS — M9901 Segmental and somatic dysfunction of cervical region: Secondary | ICD-10-CM | POA: Diagnosis not present

## 2016-02-03 ENCOUNTER — Other Ambulatory Visit (INDEPENDENT_AMBULATORY_CARE_PROVIDER_SITE_OTHER): Payer: Medicare Other

## 2016-02-03 DIAGNOSIS — M1990 Unspecified osteoarthritis, unspecified site: Secondary | ICD-10-CM

## 2016-02-03 DIAGNOSIS — M79672 Pain in left foot: Secondary | ICD-10-CM | POA: Diagnosis not present

## 2016-02-03 DIAGNOSIS — M10079 Idiopathic gout, unspecified ankle and foot: Secondary | ICD-10-CM | POA: Diagnosis not present

## 2016-02-03 NOTE — Progress Notes (Signed)
Lab only 

## 2016-02-04 LAB — URIC ACID: Uric Acid: 4.1 mg/dL (ref 2.5–7.1)

## 2016-03-03 ENCOUNTER — Ambulatory Visit: Payer: Medicare Other | Admitting: Pulmonary Disease

## 2016-03-25 DIAGNOSIS — E6609 Other obesity due to excess calories: Secondary | ICD-10-CM | POA: Diagnosis not present

## 2016-03-25 DIAGNOSIS — M62838 Other muscle spasm: Secondary | ICD-10-CM | POA: Diagnosis not present

## 2016-03-25 DIAGNOSIS — M9904 Segmental and somatic dysfunction of sacral region: Secondary | ICD-10-CM | POA: Diagnosis not present

## 2016-03-25 DIAGNOSIS — I973 Postprocedural hypertension: Secondary | ICD-10-CM | POA: Diagnosis not present

## 2016-03-25 DIAGNOSIS — M9902 Segmental and somatic dysfunction of thoracic region: Secondary | ICD-10-CM | POA: Diagnosis not present

## 2016-03-25 DIAGNOSIS — M9903 Segmental and somatic dysfunction of lumbar region: Secondary | ICD-10-CM | POA: Diagnosis not present

## 2016-03-25 DIAGNOSIS — M9905 Segmental and somatic dysfunction of pelvic region: Secondary | ICD-10-CM | POA: Diagnosis not present

## 2016-03-25 DIAGNOSIS — M9901 Segmental and somatic dysfunction of cervical region: Secondary | ICD-10-CM | POA: Diagnosis not present

## 2016-03-29 DIAGNOSIS — M62838 Other muscle spasm: Secondary | ICD-10-CM | POA: Diagnosis not present

## 2016-03-29 DIAGNOSIS — M9902 Segmental and somatic dysfunction of thoracic region: Secondary | ICD-10-CM | POA: Diagnosis not present

## 2016-03-29 DIAGNOSIS — M9904 Segmental and somatic dysfunction of sacral region: Secondary | ICD-10-CM | POA: Diagnosis not present

## 2016-03-29 DIAGNOSIS — M9905 Segmental and somatic dysfunction of pelvic region: Secondary | ICD-10-CM | POA: Diagnosis not present

## 2016-03-29 DIAGNOSIS — M9903 Segmental and somatic dysfunction of lumbar region: Secondary | ICD-10-CM | POA: Diagnosis not present

## 2016-03-29 DIAGNOSIS — E6609 Other obesity due to excess calories: Secondary | ICD-10-CM | POA: Diagnosis not present

## 2016-03-29 DIAGNOSIS — M9901 Segmental and somatic dysfunction of cervical region: Secondary | ICD-10-CM | POA: Diagnosis not present

## 2016-03-29 DIAGNOSIS — I973 Postprocedural hypertension: Secondary | ICD-10-CM | POA: Diagnosis not present

## 2016-03-31 DIAGNOSIS — M9903 Segmental and somatic dysfunction of lumbar region: Secondary | ICD-10-CM | POA: Diagnosis not present

## 2016-03-31 DIAGNOSIS — M9904 Segmental and somatic dysfunction of sacral region: Secondary | ICD-10-CM | POA: Diagnosis not present

## 2016-03-31 DIAGNOSIS — M62838 Other muscle spasm: Secondary | ICD-10-CM | POA: Diagnosis not present

## 2016-03-31 DIAGNOSIS — I973 Postprocedural hypertension: Secondary | ICD-10-CM | POA: Diagnosis not present

## 2016-03-31 DIAGNOSIS — M9902 Segmental and somatic dysfunction of thoracic region: Secondary | ICD-10-CM | POA: Diagnosis not present

## 2016-03-31 DIAGNOSIS — E6609 Other obesity due to excess calories: Secondary | ICD-10-CM | POA: Diagnosis not present

## 2016-03-31 DIAGNOSIS — M9901 Segmental and somatic dysfunction of cervical region: Secondary | ICD-10-CM | POA: Diagnosis not present

## 2016-03-31 DIAGNOSIS — M9905 Segmental and somatic dysfunction of pelvic region: Secondary | ICD-10-CM | POA: Diagnosis not present

## 2016-04-05 DIAGNOSIS — M9905 Segmental and somatic dysfunction of pelvic region: Secondary | ICD-10-CM | POA: Diagnosis not present

## 2016-04-05 DIAGNOSIS — I973 Postprocedural hypertension: Secondary | ICD-10-CM | POA: Diagnosis not present

## 2016-04-05 DIAGNOSIS — E6609 Other obesity due to excess calories: Secondary | ICD-10-CM | POA: Diagnosis not present

## 2016-04-05 DIAGNOSIS — M9903 Segmental and somatic dysfunction of lumbar region: Secondary | ICD-10-CM | POA: Diagnosis not present

## 2016-04-05 DIAGNOSIS — M9902 Segmental and somatic dysfunction of thoracic region: Secondary | ICD-10-CM | POA: Diagnosis not present

## 2016-04-05 DIAGNOSIS — M9901 Segmental and somatic dysfunction of cervical region: Secondary | ICD-10-CM | POA: Diagnosis not present

## 2016-04-05 DIAGNOSIS — M9904 Segmental and somatic dysfunction of sacral region: Secondary | ICD-10-CM | POA: Diagnosis not present

## 2016-04-05 DIAGNOSIS — M62838 Other muscle spasm: Secondary | ICD-10-CM | POA: Diagnosis not present

## 2016-04-19 ENCOUNTER — Encounter: Payer: Medicare Other | Admitting: *Deleted

## 2016-04-19 DIAGNOSIS — M9905 Segmental and somatic dysfunction of pelvic region: Secondary | ICD-10-CM | POA: Diagnosis not present

## 2016-04-19 DIAGNOSIS — M9904 Segmental and somatic dysfunction of sacral region: Secondary | ICD-10-CM | POA: Diagnosis not present

## 2016-04-19 DIAGNOSIS — M9901 Segmental and somatic dysfunction of cervical region: Secondary | ICD-10-CM | POA: Diagnosis not present

## 2016-04-19 DIAGNOSIS — E6609 Other obesity due to excess calories: Secondary | ICD-10-CM | POA: Diagnosis not present

## 2016-04-19 DIAGNOSIS — I973 Postprocedural hypertension: Secondary | ICD-10-CM | POA: Diagnosis not present

## 2016-04-19 DIAGNOSIS — Z1231 Encounter for screening mammogram for malignant neoplasm of breast: Secondary | ICD-10-CM | POA: Diagnosis not present

## 2016-04-19 DIAGNOSIS — M62838 Other muscle spasm: Secondary | ICD-10-CM | POA: Diagnosis not present

## 2016-04-19 DIAGNOSIS — M9903 Segmental and somatic dysfunction of lumbar region: Secondary | ICD-10-CM | POA: Diagnosis not present

## 2016-04-19 DIAGNOSIS — M9902 Segmental and somatic dysfunction of thoracic region: Secondary | ICD-10-CM | POA: Diagnosis not present

## 2016-04-19 LAB — HM MAMMOGRAPHY

## 2016-04-21 ENCOUNTER — Encounter: Payer: Self-pay | Admitting: Pulmonary Disease

## 2016-04-21 ENCOUNTER — Ambulatory Visit (INDEPENDENT_AMBULATORY_CARE_PROVIDER_SITE_OTHER): Payer: Medicare Other | Admitting: Pulmonary Disease

## 2016-04-21 VITALS — BP 118/78 | HR 63 | Ht 65.0 in | Wt 230.0 lb

## 2016-04-21 DIAGNOSIS — R05 Cough: Secondary | ICD-10-CM | POA: Diagnosis not present

## 2016-04-21 DIAGNOSIS — M9905 Segmental and somatic dysfunction of pelvic region: Secondary | ICD-10-CM | POA: Diagnosis not present

## 2016-04-21 DIAGNOSIS — M62838 Other muscle spasm: Secondary | ICD-10-CM | POA: Diagnosis not present

## 2016-04-21 DIAGNOSIS — M9903 Segmental and somatic dysfunction of lumbar region: Secondary | ICD-10-CM | POA: Diagnosis not present

## 2016-04-21 DIAGNOSIS — R059 Cough, unspecified: Secondary | ICD-10-CM | POA: Insufficient documentation

## 2016-04-21 DIAGNOSIS — G4733 Obstructive sleep apnea (adult) (pediatric): Secondary | ICD-10-CM | POA: Diagnosis not present

## 2016-04-21 DIAGNOSIS — E6609 Other obesity due to excess calories: Secondary | ICD-10-CM | POA: Diagnosis not present

## 2016-04-21 DIAGNOSIS — I973 Postprocedural hypertension: Secondary | ICD-10-CM | POA: Diagnosis not present

## 2016-04-21 DIAGNOSIS — M9902 Segmental and somatic dysfunction of thoracic region: Secondary | ICD-10-CM | POA: Diagnosis not present

## 2016-04-21 DIAGNOSIS — M9901 Segmental and somatic dysfunction of cervical region: Secondary | ICD-10-CM | POA: Diagnosis not present

## 2016-04-21 DIAGNOSIS — M9904 Segmental and somatic dysfunction of sacral region: Secondary | ICD-10-CM | POA: Diagnosis not present

## 2016-04-21 NOTE — Progress Notes (Signed)
Subjective:    Patient ID: Bethany [name], female    DOB: 1951-08-19, 65 y.o.   MRN: [id number]  HPI Patient returns to the office as f/u on her OSA.   ROV (04/21/16) Pt has not been using cpap 2/2 sinus issues. DL x 3 mos > 14%, AHI 1 (until 12/2015).  Had L knee surgery in 05/2015 > went OK. Has not received supplies in a yr as insurance did not cover.    Review of Systems  Constitutional: Negative.   HENT: Negative.   Eyes: Negative.   Respiratory: Positive for cough.   Cardiovascular: Negative.   Gastrointestinal: Negative.   Endocrine: Negative.   Genitourinary: Negative.   Musculoskeletal: Negative.   Allergic/Immunologic: Negative.   Neurological: Negative.   Hematological: Negative.   Psychiatric/Behavioral: Negative.   All other systems reviewed and are negative.  Past Medical History  Diagnosis Date  . Hypertension   . Hyperlipidemia   . Allergy 1997    knees  . Obesity   . GERD (gastroesophageal reflux disease)   . Seasonal allergies   . Arthritis   . Sleep apnea     cpap  . Pneumonia     1 month ago - rsolved     Family History  Problem Relation Age of Onset  . Heart disease Mother   . Diabetes Sister   . Arthritis Brother     knees  . Heart disease Sister     tumor in the heart     Past Surgical History  Procedure Laterality Date  . Abdominal hysterectomy    . Tubal ligation    . Phlebectomy    . Total knee arthroplasty Right 10/22/2013    Procedure: RIGHT TOTAL KNEE ARTHROPLASTY;  Surgeon: Gearlean Alf, MD;  Location: [facility];  Service: Orthopedics;  Laterality: Right;  . Ganglion cyst excision      left  . Breast surgery      fibroid cysts removed  . Total knee arthroplasty Left 06/11/2015    Procedure: LEFT TOTAL KNEE ARTHROPLASTY;  Surgeon: Gaynelle Arabian, MD;  Location: [facility];  Service: Orthopedics;  Laterality: Left;    Social History   Social History  . Marital Status: Married    Spouse Name: N/A  . Number of Children: N/A    . Years of Education: N/A   Occupational History  . house wife    Social History Main Topics  . Smoking status: Never Smoker   . Smokeless tobacco: Never Used  . Alcohol Use: No  . Drug Use: No  . Sexual Activity: Not Currently     Comment: husband not able to have  sex   Other Topics Concern  . Not on file   Social History Narrative     No Known Allergies   Outpatient Prescriptions Prior to Visit  Medication Sig Dispense Refill  . acyclovir (ZOVIRAX) 200 MG capsule TAKE 1 CAPSULE EVERY DAY AS NEEDED FOR  FLAREUPS 90 capsule 0  . meclizine (ANTIVERT) 25 MG tablet TAKE 1 TABLET THREE TIMES DAILY AS NEEDED  FOR  DIZZINESS 270 tablet 0  . omeprazole (PRILOSEC) 20 MG capsule Take 1 capsule (20 mg total) by mouth daily. 90 capsule 1  . pravastatin (PRAVACHOL) 40 MG tablet Take 1 tablet (40 mg total) by mouth every morning. 90 tablet 1  . valsartan-hydrochlorothiazide (DIOVAN HCT) 160-25 MG tablet Take 1 tablet by mouth daily. 90 tablet 3   No facility-administered medications prior to visit.   No  orders of the defined types were placed in this encounter.          Objective:   Physical Exam   Vitals:  Filed Vitals:   04/21/16 0934  BP: 118/78  Pulse: 63  Height: 5\' 5"  (1.651 m)  Weight: 230 lb (104.327 kg)  SpO2: 96%    Constitutional/General:  Pleasant, well-nourished, well-developed, not in any distress,  Comfortably seating.  Well kempt  Body mass index is 38.27 kg/(m^2). Wt Readings from Last 3 Encounters:  04/21/16 230 lb (104.327 kg)  06/11/15 235 lb (106.595 kg)  05/14/15 235 lb (106.595 kg)      HEENT: Pupils equal and reactive to light and accommodation. Anicteric sclerae. Normal nasal mucosa.   No oral  lesions,  mouth clear,  oropharynx clear, no postnasal drip. (-) Oral thrush. No dental caries.  Airway - Mallampati class III  Neck: No masses. Midline trachea. No JVD, (-) LAD. (-) bruits appreciated.  Respiratory/Chest: Grossly normal  chest. (-) deformity. (-) Accessory muscle use.  Symmetric expansion. (-) Tenderness on palpation.  Resonant on percussion.  Diminished BS on both lower lung zones. (-) wheezing, crackles, rhonchi (-) egophony  Cardiovascular: Regular rate and  rhythm, heart sounds normal, no murmur or gallops, no peripheral edema  Gastrointestinal:  Normal bowel sounds. Soft, non-tender. No hepatosplenomegaly.  (-) masses.   Musculoskeletal:  Normal muscle tone. Normal gait.   Extremities: Grossly normal. (-) clubbing, cyanosis.  (-) edema  Skin: (-) rash,lesions seen.   Neurological/Psychiatric : alert, oriented to time, place, person. Normal mood and affect           Assessment & Plan:  OSA (obstructive sleep apnea) HST 04/2014:  AHI 7/hr Pt is here for follow-up on her sleep apnea. Recent sinus issues. Not generally compliant but she tries to use it. No significant comorbidities. Only has mild sleep apnea. Plan : 1. We'll order supplies. Told patient to give [place] a call if she does received supplies in 2-3 mos. Not sure what the issue was. She was switched to Medicare a year ago and had the machine 2 years ago. 2. Encouraged to use CPAP machine.   Cough Recent sinus issues. Cont OTC meds. Holding off on flonase.    Return to clinic in 1 yr  J. Shirl Harris, MD 04/21/2016, 9:50 AM [facility] Pager [phone] After 3 pm or if no answer, call [phone]

## 2016-04-21 NOTE — Patient Instructions (Signed)
1. Continue using cpap. 2. We will order you cpap supplies. Call us if you dont get supplies in 2-3 weeks.  Return to clinic in 1 yr

## 2016-04-21 NOTE — Assessment & Plan Note (Signed)
HST 04/2014:  AHI 7/hr Pt is here for follow-up on her sleep apnea. Recent sinus issues. Not generally compliant but she tries to use it. No significant comorbidities. Only has mild sleep apnea. Plan : 1. We'll order supplies. Told patient to give [place] a call if she does received supplies in 2-3 mos. Not sure what the issue was. She was switched to Medicare a year ago and had the machine 2 years ago. 2. Encouraged to use CPAP machine.

## 2016-04-21 NOTE — Assessment & Plan Note (Signed)
Recent sinus issues. Cont OTC meds. Holding off on flonase.

## 2016-04-22 ENCOUNTER — Telehealth: Payer: Self-pay | Admitting: Pulmonary Disease

## 2016-04-22 NOTE — Telephone Encounter (Signed)
LMTCB for Bethany [name]  

## 2016-04-26 ENCOUNTER — Telehealth: Payer: Self-pay | Admitting: Pulmonary Disease

## 2016-04-26 DIAGNOSIS — M9902 Segmental and somatic dysfunction of thoracic region: Secondary | ICD-10-CM | POA: Diagnosis not present

## 2016-04-26 DIAGNOSIS — E6609 Other obesity due to excess calories: Secondary | ICD-10-CM | POA: Diagnosis not present

## 2016-04-26 DIAGNOSIS — M9903 Segmental and somatic dysfunction of lumbar region: Secondary | ICD-10-CM | POA: Diagnosis not present

## 2016-04-26 DIAGNOSIS — M9901 Segmental and somatic dysfunction of cervical region: Secondary | ICD-10-CM | POA: Diagnosis not present

## 2016-04-26 DIAGNOSIS — M62838 Other muscle spasm: Secondary | ICD-10-CM | POA: Diagnosis not present

## 2016-04-26 DIAGNOSIS — M9904 Segmental and somatic dysfunction of sacral region: Secondary | ICD-10-CM | POA: Diagnosis not present

## 2016-04-26 DIAGNOSIS — I973 Postprocedural hypertension: Secondary | ICD-10-CM | POA: Diagnosis not present

## 2016-04-26 DIAGNOSIS — M9905 Segmental and somatic dysfunction of pelvic region: Secondary | ICD-10-CM | POA: Diagnosis not present

## 2016-04-26 NOTE — Telephone Encounter (Signed)
Spoke with Melissa at [facility], states that because there is documentation of noncompliance in her last office note she cannot change DME providers at this time, and will have to stay with [facility] until she has some documented compliance.   Forwarding to AD to make aware.  Please advise on further recs.  Thanks!

## 2016-04-26 NOTE — Telephone Encounter (Signed)
lmtcb X2 for Melissa with [facility].

## 2016-04-26 NOTE — Telephone Encounter (Signed)
Duplicate message. See previous telephone encounter.

## 2016-04-27 NOTE — Telephone Encounter (Signed)
LMTCB

## 2016-04-27 NOTE — Telephone Encounter (Signed)
pls advise pt to use her cpap. Tell her we can not change dmes since she was not compliant based on recent download. tnx  AD

## 2016-04-27 NOTE — Telephone Encounter (Signed)
Spoke with pt and explained why we are unable to switch DME's at this time. Pt advised to use CPAP continuously for at least 30 days. Pt is to call us if she would still like to change DME's after that time. Nothing further needed.

## 2016-04-27 NOTE — Telephone Encounter (Signed)
Patient returning our call-prm  °

## 2016-04-28 DIAGNOSIS — I973 Postprocedural hypertension: Secondary | ICD-10-CM | POA: Diagnosis not present

## 2016-04-28 DIAGNOSIS — M9904 Segmental and somatic dysfunction of sacral region: Secondary | ICD-10-CM | POA: Diagnosis not present

## 2016-04-28 DIAGNOSIS — M9901 Segmental and somatic dysfunction of cervical region: Secondary | ICD-10-CM | POA: Diagnosis not present

## 2016-04-28 DIAGNOSIS — M9905 Segmental and somatic dysfunction of pelvic region: Secondary | ICD-10-CM | POA: Diagnosis not present

## 2016-04-28 DIAGNOSIS — M9903 Segmental and somatic dysfunction of lumbar region: Secondary | ICD-10-CM | POA: Diagnosis not present

## 2016-04-28 DIAGNOSIS — E6609 Other obesity due to excess calories: Secondary | ICD-10-CM | POA: Diagnosis not present

## 2016-04-28 DIAGNOSIS — M9902 Segmental and somatic dysfunction of thoracic region: Secondary | ICD-10-CM | POA: Diagnosis not present

## 2016-04-28 DIAGNOSIS — M62838 Other muscle spasm: Secondary | ICD-10-CM | POA: Diagnosis not present

## 2016-04-29 ENCOUNTER — Encounter: Payer: Self-pay | Admitting: *Deleted

## 2016-04-29 ENCOUNTER — Telehealth: Payer: Self-pay | Admitting: Family Medicine

## 2016-04-29 ENCOUNTER — Ambulatory Visit (INDEPENDENT_AMBULATORY_CARE_PROVIDER_SITE_OTHER): Payer: Medicare Other | Admitting: Family

## 2016-04-29 ENCOUNTER — Encounter: Payer: Self-pay | Admitting: Family

## 2016-04-29 ENCOUNTER — Encounter (INDEPENDENT_AMBULATORY_CARE_PROVIDER_SITE_OTHER): Payer: Self-pay

## 2016-04-29 ENCOUNTER — Ambulatory Visit (INDEPENDENT_AMBULATORY_CARE_PROVIDER_SITE_OTHER): Payer: Medicare Other

## 2016-04-29 VITALS — BP 125/90 | HR 92 | Temp 98.7°F | Ht 65.0 in | Wt 229.0 lb

## 2016-04-29 DIAGNOSIS — L03116 Cellulitis of left lower limb: Secondary | ICD-10-CM

## 2016-04-29 DIAGNOSIS — M25462 Effusion, left knee: Secondary | ICD-10-CM | POA: Diagnosis not present

## 2016-04-29 DIAGNOSIS — M25562 Pain in left knee: Secondary | ICD-10-CM | POA: Diagnosis not present

## 2016-04-29 MED ORDER — SULFAMETHOXAZOLE-TRIMETHOPRIM 800-160 MG PO TABS: 1.0000 | ORAL_TABLET | Freq: Two times a day (BID) | ORAL | Status: DC

## 2016-04-29 MED ORDER — TRAMADOL HCL 50 MG PO TABS: 50.0000 mg | ORAL_TABLET | Freq: Three times a day (TID) | ORAL | Status: DC | PRN

## 2016-04-29 NOTE — Telephone Encounter (Signed)
Pt given appt today with Christy at 3:40.

## 2016-04-29 NOTE — Progress Notes (Signed)
   Subjective:    Patient ID: Bethany [name], female    DOB: 15-Feb-1951, 65 y.o.   MRN: [id number]  Knee Pain  The incident occurred more than 1 week ago. There was no injury mechanism. The pain is present in the left knee. The quality of the pain is described as aching. The pain is at a severity of 7/10. The pain is moderate. Associated symptoms include an inability to bear weight, a loss of motion and a loss of sensation. She reports no foreign bodies present. The symptoms are aggravated by movement and weight bearing. She has tried acetaminophen for the symptoms. The treatment provided mild relief.      Review of Systems  All other systems reviewed and are negative.      Objective:   Physical Exam  Constitutional: She is oriented to person, place, and time. She appears well-developed and well-nourished. No distress.  HENT:  Head: Normocephalic.  Eyes: Pupils are equal, round, and reactive to light.  Neck: Normal range of motion. Neck supple. No thyromegaly present.  Cardiovascular: Normal rate, regular rhythm, normal heart sounds and intact distal pulses.   No murmur heard. Pulmonary/Chest: Effort normal and breath sounds normal. No respiratory distress. She has no wheezes.  Abdominal: Soft. Bowel sounds are normal. She exhibits no distension. There is no tenderness.  Musculoskeletal: Normal range of motion. She exhibits edema (moderate amt of swelling in left knee, warm to touch) and tenderness.  Neurological: She is alert and oriented to person, place, and time.  Skin: Skin is warm and dry.  Psychiatric: She has a normal mood and affect. Her behavior is normal. Judgment and thought content normal.  Vitals reviewed.   BP 125/90 mmHg  Pulse 92  Temp(Src) 98.7 F (37.1 C) (Oral)  Ht _0  (1.651 m)  Wt 229 lb (103.874 kg)  BMI 38.11 kg/m2  Knee x-ray- Hardware in place, degenerative changes Preliminary reading by Evelina Dun, FNP [facility]      Assessment & Plan:  1. Left  knee pain - DG Knee 1-2 Views Left; Future - sulfamethoxazole-trimethoprim (BACTRIM DS) 800-160 MG tablet; Take 1 tablet by mouth 2 (two) times daily.  Dispense: 20 tablet; Refill: 0 - traMADol (ULTRAM) 50 MG tablet; Take 1-2 tablets (50-100 mg total) by mouth every 8 (eight) hours as needed.  Dispense: 90 tablet; Refill: 0 - Arthritis Panel - CMP14+EGFR  2. Knee swelling, left - DG Knee 1-2 Views Left; Future  3. Cellulitis of knee, left - sulfamethoxazole-trimethoprim (BACTRIM DS) 800-160 MG tablet; Take 1 tablet by mouth 2 (two) times daily.  Dispense: 20 tablet; Refill: 0 - traMADol (ULTRAM) 50 MG tablet; Take 1-2 tablets (50-100 mg total) by mouth every 8 (eight) hours as needed.  Dispense: 90 tablet; Refill: 0  Labs pending, but I am going to treat patient for cellulites of knee because of the warmth and swelling. If uric acid comes back elevated will d/c antibiotics and start allopurinol. Pt has appt with ortho on 05/16- Pt to keep appt. RTO in 1 week  Evelina Dun, FNP

## 2016-04-29 NOTE — Patient Instructions (Signed)

## 2016-04-29 NOTE — Addendum Note (Signed)
Addended by: Evelina Dun A on: 04/29/2016 04:35 PM   Modules accepted: Orders, SmartSet

## 2016-04-30 LAB — ARTHRITIS PANEL
BASOS ABS: 0 10*3/uL (ref 0.0–0.2)
Basos: 0 %
EOS (ABSOLUTE): 0.2 10*3/uL (ref 0.0–0.4)
Eos: 2 %
HEMOGLOBIN: 12.5 g/dL (ref 11.1–15.9)
Hematocrit: 37.5 % (ref 34.0–46.6)
Immature Grans (Abs): 0 10*3/uL (ref 0.0–0.1)
Immature Granulocytes: 0 %
LYMPHS ABS: 2.8 10*3/uL (ref 0.7–3.1)
Lymphs: 24 %
MCH: 28 pg (ref 26.6–33.0)
MCHC: 33.3 g/dL (ref 31.5–35.7)
MCV: 84 fL (ref 79–97)
Monocytes Absolute: 0.8 10*3/uL (ref 0.1–0.9)
Monocytes: 7 %
NEUTROS ABS: 7.9 10*3/uL — AB (ref 1.4–7.0)
NEUTROS PCT: 67 %
Platelets: 306 10*3/uL (ref 150–379)
RBC: 4.46 x10E6/uL (ref 3.77–5.28)
RDW: 15.2 % (ref 12.3–15.4)
Rhuematoid fact SerPl-aCnc: 10.4 IU/mL (ref 0.0–13.9)
SED RATE: 3 mm/h (ref 0–40)
URIC ACID: 4.6 mg/dL (ref 2.5–7.1)
WBC: 11.7 10*3/uL — AB (ref 3.4–10.8)

## 2016-04-30 LAB — CMP14+EGFR
A/G RATIO: 1.4 (ref 1.2–2.2)
ALBUMIN: 4.3 g/dL (ref 3.6–4.8)
ALK PHOS: 95 IU/L (ref 39–117)
ALT: 14 IU/L (ref 0–32)
AST: 21 IU/L (ref 0–40)
BUN/Creatinine Ratio: 12 (ref 12–28)
BUN: 10 mg/dL (ref 8–27)
Bilirubin Total: 0.6 mg/dL (ref 0.0–1.2)
CHLORIDE: 100 mmol/L (ref 96–106)
CO2: 28 mmol/L (ref 18–29)
Calcium: 9.6 mg/dL (ref 8.7–10.3)
Creatinine, Ser: 0.82 mg/dL (ref 0.57–1.00)
GFR calc non Af Amer: 75 mL/min/{1.73_m2} (ref >59)
GFR, EST AFRICAN AMERICAN: 86 mL/min/{1.73_m2} (ref >59)
Globulin, Total: 3.1 g/dL (ref 1.5–4.5)
Glucose: 143 mg/dL — ABNORMAL HIGH (ref 65–99)
POTASSIUM: 3.1 mmol/L — AB (ref 3.5–5.2)
SODIUM: 144 mmol/L (ref 134–144)
TOTAL PROTEIN: 7.4 g/dL (ref 6.0–8.5)

## 2016-05-03 ENCOUNTER — Other Ambulatory Visit: Payer: Self-pay | Admitting: Family

## 2016-05-03 ENCOUNTER — Encounter: Payer: Self-pay | Admitting: Pulmonary Disease

## 2016-05-03 MED ORDER — POTASSIUM CHLORIDE ER 10 MEQ PO TBCR: 10.0000 meq | EXTENDED_RELEASE_TABLET | Freq: Every day | ORAL | Status: DC

## 2016-05-06 ENCOUNTER — Ambulatory Visit (INDEPENDENT_AMBULATORY_CARE_PROVIDER_SITE_OTHER): Payer: Medicare Other | Admitting: Family

## 2016-05-06 ENCOUNTER — Encounter: Payer: Self-pay | Admitting: Family

## 2016-05-06 VITALS — BP 119/84 | HR 82 | Temp 98.3°F | Ht 65.0 in | Wt 228.4 lb

## 2016-05-06 DIAGNOSIS — L03116 Cellulitis of left lower limb: Secondary | ICD-10-CM | POA: Diagnosis not present

## 2016-05-06 DIAGNOSIS — M62838 Other muscle spasm: Secondary | ICD-10-CM | POA: Diagnosis not present

## 2016-05-06 DIAGNOSIS — M25462 Effusion, left knee: Secondary | ICD-10-CM | POA: Diagnosis not present

## 2016-05-06 DIAGNOSIS — M25562 Pain in left knee: Secondary | ICD-10-CM

## 2016-05-06 MED ORDER — CYCLOBENZAPRINE HCL 5 MG PO TABS: 5.0000 mg | ORAL_TABLET | Freq: Three times a day (TID) | ORAL | Status: DC | PRN

## 2016-05-06 MED ORDER — NAPROXEN 500 MG PO TABS: 500.0000 mg | ORAL_TABLET | Freq: Two times a day (BID) | ORAL | Status: DC
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
## 2016-05-06 NOTE — Patient Instructions (Signed)
Knee Effusion °Knee effusion means that you have excess fluid in your knee joint. This can cause pain and swelling in your knee. This may make your knee more difficult to bend and move. That is because there is increased pain and pressure in the joint. If there is fluid in your knee, it often means that something is wrong inside your knee, such as severe arthritis, abnormal inflammation, or an infection. Another common cause of knee effusion is an injury to the knee muscles, ligaments, or cartilage. °HOME CARE INSTRUCTIONS °· Use crutches as directed by your health care provider. °· Wear a knee brace as directed by your health care provider. °· Apply ice to the swollen area: °¨ Put ice in a plastic bag. °¨ Place a towel between your skin and the bag. °¨ Leave the ice on for 20 minutes, 2-3 times per day. °· Keep your knee raised (elevated) when you are sitting or lying down. °· Take medicines only as directed by your health care provider. °· Do any rehabilitation or strengthening exercises as directed by your health care provider. °· Rest your knee as directed by your health care provider. You may start doing your normal activities again when your health care provider approves.    °· Keep all follow-up visits as directed by your health care provider. This is important. °SEEK MEDICAL CARE IF: °· You have ongoing (persistent) pain in your knee. °SEEK IMMEDIATE MEDICAL CARE IF: °· You have increased swelling or redness of your knee. °· You have severe pain in your knee. °· You have a fever. °  °This information is not intended to replace advice given to you by your health care provider. Make sure you discuss any questions you have with your health care provider. °  °Document Released: 03/04/2004 Document Revised: 01/03/2015 Document Reviewed: 07/29/2014 °Elsevier Interactive Patient Education ©2016 [employer] ° °

## 2016-05-11 DIAGNOSIS — Z96652 Presence of left artificial knee joint: Secondary | ICD-10-CM | POA: Diagnosis not present

## 2016-05-11 DIAGNOSIS — Z471 Aftercare following joint replacement surgery: Secondary | ICD-10-CM | POA: Diagnosis not present

## 2016-06-07 DIAGNOSIS — M9902 Segmental and somatic dysfunction of thoracic region: Secondary | ICD-10-CM | POA: Diagnosis not present

## 2016-06-07 DIAGNOSIS — M9904 Segmental and somatic dysfunction of sacral region: Secondary | ICD-10-CM | POA: Diagnosis not present

## 2016-06-07 DIAGNOSIS — M531 Cervicobrachial syndrome: Secondary | ICD-10-CM | POA: Diagnosis not present

## 2016-06-07 DIAGNOSIS — M9901 Segmental and somatic dysfunction of cervical region: Secondary | ICD-10-CM | POA: Diagnosis not present

## 2016-06-07 DIAGNOSIS — I973 Postprocedural hypertension: Secondary | ICD-10-CM | POA: Diagnosis not present

## 2016-06-07 DIAGNOSIS — E6609 Other obesity due to excess calories: Secondary | ICD-10-CM | POA: Diagnosis not present

## 2016-06-07 DIAGNOSIS — M9903 Segmental and somatic dysfunction of lumbar region: Secondary | ICD-10-CM | POA: Diagnosis not present

## 2016-06-07 DIAGNOSIS — M9905 Segmental and somatic dysfunction of pelvic region: Secondary | ICD-10-CM | POA: Diagnosis not present

## 2016-06-09 DIAGNOSIS — M9901 Segmental and somatic dysfunction of cervical region: Secondary | ICD-10-CM | POA: Diagnosis not present

## 2016-06-09 DIAGNOSIS — M9904 Segmental and somatic dysfunction of sacral region: Secondary | ICD-10-CM | POA: Diagnosis not present

## 2016-06-09 DIAGNOSIS — E6609 Other obesity due to excess calories: Secondary | ICD-10-CM | POA: Diagnosis not present

## 2016-06-09 DIAGNOSIS — M9905 Segmental and somatic dysfunction of pelvic region: Secondary | ICD-10-CM | POA: Diagnosis not present

## 2016-06-09 DIAGNOSIS — I973 Postprocedural hypertension: Secondary | ICD-10-CM | POA: Diagnosis not present

## 2016-06-09 DIAGNOSIS — M9902 Segmental and somatic dysfunction of thoracic region: Secondary | ICD-10-CM | POA: Diagnosis not present

## 2016-06-09 DIAGNOSIS — M9903 Segmental and somatic dysfunction of lumbar region: Secondary | ICD-10-CM | POA: Diagnosis not present

## 2016-06-09 DIAGNOSIS — M531 Cervicobrachial syndrome: Secondary | ICD-10-CM | POA: Diagnosis not present

## 2016-06-10 DIAGNOSIS — M9905 Segmental and somatic dysfunction of pelvic region: Secondary | ICD-10-CM | POA: Diagnosis not present

## 2016-06-10 DIAGNOSIS — M9904 Segmental and somatic dysfunction of sacral region: Secondary | ICD-10-CM | POA: Diagnosis not present

## 2016-06-10 DIAGNOSIS — M9903 Segmental and somatic dysfunction of lumbar region: Secondary | ICD-10-CM | POA: Diagnosis not present

## 2016-06-10 DIAGNOSIS — M531 Cervicobrachial syndrome: Secondary | ICD-10-CM | POA: Diagnosis not present

## 2016-06-10 DIAGNOSIS — I973 Postprocedural hypertension: Secondary | ICD-10-CM | POA: Diagnosis not present

## 2016-06-10 DIAGNOSIS — E6609 Other obesity due to excess calories: Secondary | ICD-10-CM | POA: Diagnosis not present

## 2016-06-10 DIAGNOSIS — M9902 Segmental and somatic dysfunction of thoracic region: Secondary | ICD-10-CM | POA: Diagnosis not present

## 2016-06-10 DIAGNOSIS — M9901 Segmental and somatic dysfunction of cervical region: Secondary | ICD-10-CM | POA: Diagnosis not present

## 2016-06-14 DIAGNOSIS — M9905 Segmental and somatic dysfunction of pelvic region: Secondary | ICD-10-CM | POA: Diagnosis not present

## 2016-06-14 DIAGNOSIS — M9903 Segmental and somatic dysfunction of lumbar region: Secondary | ICD-10-CM | POA: Diagnosis not present

## 2016-06-14 DIAGNOSIS — M9901 Segmental and somatic dysfunction of cervical region: Secondary | ICD-10-CM | POA: Diagnosis not present

## 2016-06-14 DIAGNOSIS — M531 Cervicobrachial syndrome: Secondary | ICD-10-CM | POA: Diagnosis not present

## 2016-06-14 DIAGNOSIS — I973 Postprocedural hypertension: Secondary | ICD-10-CM | POA: Diagnosis not present

## 2016-06-14 DIAGNOSIS — E6609 Other obesity due to excess calories: Secondary | ICD-10-CM | POA: Diagnosis not present

## 2016-06-14 DIAGNOSIS — M9904 Segmental and somatic dysfunction of sacral region: Secondary | ICD-10-CM | POA: Diagnosis not present

## 2016-06-14 DIAGNOSIS — M9902 Segmental and somatic dysfunction of thoracic region: Secondary | ICD-10-CM | POA: Diagnosis not present

## 2016-06-17 DIAGNOSIS — M9904 Segmental and somatic dysfunction of sacral region: Secondary | ICD-10-CM | POA: Diagnosis not present

## 2016-06-17 DIAGNOSIS — M9902 Segmental and somatic dysfunction of thoracic region: Secondary | ICD-10-CM | POA: Diagnosis not present

## 2016-06-17 DIAGNOSIS — M9901 Segmental and somatic dysfunction of cervical region: Secondary | ICD-10-CM | POA: Diagnosis not present

## 2016-06-17 DIAGNOSIS — I973 Postprocedural hypertension: Secondary | ICD-10-CM | POA: Diagnosis not present

## 2016-06-17 DIAGNOSIS — M531 Cervicobrachial syndrome: Secondary | ICD-10-CM | POA: Diagnosis not present

## 2016-06-17 DIAGNOSIS — M9905 Segmental and somatic dysfunction of pelvic region: Secondary | ICD-10-CM | POA: Diagnosis not present

## 2016-06-17 DIAGNOSIS — E6609 Other obesity due to excess calories: Secondary | ICD-10-CM | POA: Diagnosis not present

## 2016-06-17 DIAGNOSIS — M9903 Segmental and somatic dysfunction of lumbar region: Secondary | ICD-10-CM | POA: Diagnosis not present

## 2016-09-03 ENCOUNTER — Ambulatory Visit (INDEPENDENT_AMBULATORY_CARE_PROVIDER_SITE_OTHER): Payer: Medicare Other | Admitting: Family Medicine

## 2016-09-03 ENCOUNTER — Encounter: Payer: Self-pay | Admitting: Family Medicine

## 2016-09-03 VITALS — BP 106/74 | HR 83 | Temp 97.8°F | Ht 65.0 in | Wt 227.2 lb

## 2016-09-03 DIAGNOSIS — Z0000 Encounter for general adult medical examination without abnormal findings: Secondary | ICD-10-CM

## 2016-09-03 DIAGNOSIS — M25531 Pain in right wrist: Secondary | ICD-10-CM | POA: Diagnosis not present

## 2016-09-03 DIAGNOSIS — E669 Obesity, unspecified: Secondary | ICD-10-CM | POA: Diagnosis not present

## 2016-09-03 DIAGNOSIS — M1990 Unspecified osteoarthritis, unspecified site: Secondary | ICD-10-CM

## 2016-09-03 DIAGNOSIS — M25562 Pain in left knee: Secondary | ICD-10-CM | POA: Diagnosis not present

## 2016-09-03 DIAGNOSIS — E785 Hyperlipidemia, unspecified: Secondary | ICD-10-CM

## 2016-09-03 DIAGNOSIS — E559 Vitamin D deficiency, unspecified: Secondary | ICD-10-CM | POA: Diagnosis not present

## 2016-09-03 DIAGNOSIS — I10 Essential (primary) hypertension: Secondary | ICD-10-CM | POA: Diagnosis not present

## 2016-09-03 DIAGNOSIS — G8929 Other chronic pain: Secondary | ICD-10-CM | POA: Diagnosis not present

## 2016-09-03 DIAGNOSIS — Z1159 Encounter for screening for other viral diseases: Secondary | ICD-10-CM | POA: Diagnosis not present

## 2016-09-03 DIAGNOSIS — Z23 Encounter for immunization: Secondary | ICD-10-CM

## 2016-09-03 LAB — URINALYSIS
BILIRUBIN UA: NEGATIVE
GLUCOSE, UA: NEGATIVE
KETONES UA: NEGATIVE
Leukocytes, UA: NEGATIVE
Nitrite, UA: NEGATIVE
PROTEIN UA: NEGATIVE
RBC UA: NEGATIVE
SPEC GRAV UA: 1.015 (ref 1.005–1.030)
UUROB: 0.2 mg/dL (ref 0.2–1.0)
pH, UA: 6 (ref 5.0–7.5)

## 2016-09-03 MED ORDER — PREDNISONE 10 MG PO TABS: ORAL_TABLET | ORAL | 0 refills | Status: DC

## 2016-09-03 NOTE — Addendum Note (Signed)
Addended by: Marylin Crosby on: 09/03/2016 05:30 PM   Modules accepted: Orders

## 2016-09-03 NOTE — Progress Notes (Signed)
Subjective:   Bethany [name] is a 65 y.o. female who presents for an Initial Medicare Annual Wellness Visit.  Review of Systems  Right hand - pain at base of thumb.   Review of Systems  Constitutional: Negative for chills, diaphoresis, fever, malaise/fatigue and weight loss.  HENT: Negative for congestion, ear pain, hearing loss, nosebleeds, sore throat and tinnitus.   Eyes: Negative for blurred vision, double vision, photophobia, pain, discharge and redness.  Respiratory: Negative for cough, hemoptysis, sputum production, shortness of breath and wheezing.   Cardiovascular: Negative for chest pain, palpitations, orthopnea, leg swelling and PND.  Gastrointestinal: Negative for abdominal pain, blood in stool, constipation, diarrhea, heartburn, melena, nausea and vomiting.  Genitourinary: Negative for dysuria, flank pain, frequency, hematuria and urgency.  Musculoskeletal: Positive for joint pain and myalgias. Negative for back pain, falls and neck pain.  Skin: Negative for itching and rash.  Neurological: Negative for dizziness, tingling, tremors, sensory change, speech change, focal weakness, seizures, loss of consciousness, weakness and headaches.  Endo/Heme/Allergies: Negative for environmental allergies and polydipsia. Does not bruise/bleed easily.  Psychiatric/Behavioral: Negative for depression, hallucinations, memory loss, substance abuse and suicidal ideas. The patient is not nervous/anxious and does not have insomnia.      Current Medications (verified) Outpatient Encounter Prescriptions as of 09/03/2016  Medication Sig  . acyclovir (ZOVIRAX) 200 MG capsule TAKE 1 CAPSULE EVERY DAY AS NEEDED FOR  FLAREUPS  . meclizine (ANTIVERT) 25 MG tablet TAKE 1 TABLET THREE TIMES DAILY AS NEEDED  FOR  DIZZINESS  . omeprazole (PRILOSEC) 20 MG capsule Take 1 capsule (20 mg total) by mouth daily.  . pravastatin (PRAVACHOL) 40 MG tablet Take 1 tablet (40 mg total) by mouth every morning.  .  valsartan-hydrochlorothiazide (DIOVAN HCT) 160-25 MG tablet Take 1 tablet by mouth daily.  . predniSONE (DELTASONE) 10 MG tablet Take 5 daily for 3 days followed by 4,3,2 and 1 for 3 days each.  . [DISCONTINUED] cyclobenzaprine (FLEXERIL) 5 MG tablet Take 1 tablet (5 mg total) by mouth 3 (three) times daily as needed for muscle spasms.  . [DISCONTINUED] naproxen (NAPROSYN) 500 MG tablet Take 1 tablet (500 mg total) by mouth 2 (two) times daily with a meal.  . [DISCONTINUED] potassium chloride (K-DUR) 10 MEQ tablet Take 1 tablet (10 mEq total) by mouth daily.  . [DISCONTINUED] sulfamethoxazole-trimethoprim (BACTRIM DS) 800-160 MG tablet Take 1 tablet by mouth 2 (two) times daily.  . [DISCONTINUED] traMADol (ULTRAM) 50 MG tablet Take 1-2 tablets (50-100 mg total) by mouth every 8 (eight) hours as needed.   No facility-administered encounter medications on file as of 09/03/2016.     Allergies (verified) Review of patient's allergies indicates no known allergies.   History: Past Medical History:  Diagnosis Date  . Allergy 1997   knees  . Arthritis   . GERD (gastroesophageal reflux disease)   . Hyperlipidemia   . Hypertension   . Obesity   . Pneumonia    1 month ago - rsolved  . Seasonal allergies   . Sleep apnea    cpap   Past Surgical History:  Procedure Laterality Date  . ABDOMINAL HYSTERECTOMY    . BREAST SURGERY     fibroid cysts removed  . GANGLION CYST EXCISION     left  . phlebectomy    . TOTAL KNEE ARTHROPLASTY Right 10/22/2013   Procedure: RIGHT TOTAL KNEE ARTHROPLASTY;  Surgeon: Gearlean Alf, MD;  Location: [facility];  Service: Orthopedics;  Laterality: Right;  . TOTAL KNEE  ARTHROPLASTY Left 06/11/2015   Procedure: LEFT TOTAL KNEE ARTHROPLASTY;  Surgeon: Gaynelle Arabian, MD;  Location: [facility];  Service: Orthopedics;  Laterality: Left;  . TUBAL LIGATION     Family History  Problem Relation Age of Onset  . Heart disease Mother   . Diabetes Sister   . Arthritis Brother       knees  . Heart disease Sister     tumor in the heart   Social History   Occupational History  . house wife    Social History Main Topics  . Smoking status: Never Smoker  . Smokeless tobacco: Never Used  . Alcohol use No  . Drug use: No  . Sexual activity: Not Currently     Comment: husband not able to have  sex    Do you feel safe at home?  Yes Are there smokers in your home (other than you)? No  Dietary issues and exercise activities:    Current Dietary habits:  Regular diet, excess fat noted   Objective:    Today's Vitals   09/03/16 0912  BP: 106/74  Pulse: 83  Temp: 97.8 F (36.6 C)  TempSrc: Oral  Weight: 227 lb 4 oz (103.1 kg)  Height: 5\' 5"  (1.651 m)   Body mass index is 37.82 kg/m.  Activities of Daily Living In your present state of health, do you have any difficulty performing the following activities: 09/03/2016  Hearing? N  Vision? N  Difficulty concentrating or making decisions? N  Walking or climbing stairs? Y  Dressing or bathing? N  Doing errands, shopping? N  Some recent data might be hidden        Depression Screen PHQ 2/9 Scores 09/03/2016 04/29/2016 10/30/2015 05/06/2015  PHQ - 2 Score 0 0 0 1     Fall Risk Fall Risk  09/03/2016 04/29/2016 10/30/2015 05/06/2015 03/07/2015  Falls in the past year? No No No No No    Cognitive Function: 29/30 on MMSE  Immunizations and Health Maintenance Immunization History  Administered Date(s) Administered  . Influenza,inj,Quad PF,36+ Mos 09/17/2013, 10/01/2015  . Influenza-Unspecified 09/26/2014  . Pneumococcal Conjugate-13 10/01/2015   Health Maintenance Due  Topic Date Due  . Hepatitis C Screening  [birth year]  . TETANUS/TDAP  03/12/1969  . INFLUENZA VACCINE  07/27/2016  Physical Exam  Constitutional: She is oriented to person, place, and time. She appears well-developed and well-nourished. No distress.  HENT:  Head: Normocephalic and atraumatic.  Right Ear: External ear normal.  Left Ear:  External ear normal.  Nose: Nose normal.  Mouth/Throat: Oropharynx is clear and moist. No oropharyngeal exudate.  Eyes: Conjunctivae and EOM are normal. Pupils are equal, round, and reactive to light. Right eye exhibits no discharge. Left eye exhibits no discharge. No scleral icterus.  Neck: Normal range of motion. Neck supple. No JVD present. No thyromegaly present.  Cardiovascular: Normal rate, regular rhythm, normal heart sounds and intact distal pulses.  Exam reveals no gallop and no friction rub.   No murmur heard. Pulmonary/Chest: Effort normal and breath sounds normal. No stridor. No respiratory distress. She has no wheezes. She has no rales. She exhibits no tenderness.  Abdominal: Soft. Bowel sounds are normal. There is no tenderness.  Musculoskeletal: Normal range of motion. She exhibits tenderness (flexor/opponens at base of right thumb).  Lymphadenopathy:    She has no cervical adenopathy.  Neurological: She is alert and oriented to person, place, and time. She displays normal reflexes. No cranial nerve deficit. She exhibits normal muscle tone.  Coordination normal.  Skin: Skin is warm and dry. She is not diaphoretic.  Psychiatric: She has a normal mood and affect. Her behavior is normal.  Vitals reviewed.   Patient Care Team: Claretta Fraise, MD as PCP - General (Family Medicine)  Indicate any recent Medical Services you may have received from other than [facility] providers in the past year (date may be approximate).    Assessment:    Annual Wellness Visit    Screening Tests Health Maintenance  Topic Date Due  . Hepatitis C Screening  Mar 19, 1951  . TETANUS/TDAP  03/12/1969  . INFLUENZA VACCINE  07/27/2016  . ZOSTAVAX  03/06/2017 (Originally 03/12/2010)  . COLONOSCOPY  03/06/2018 (Originally 03/12/2000)  . PNA vac Low Risk Adult (2 of 2 - PPSV23) 09/30/2016  . MAMMOGRAM  04/19/2018  . DEXA SCAN  Completed   It was a pleasure to see you today. Thanks for coming in.  Today  year Medicare wellness visit revealed that you are overall quite healthy. Your mammogram is up-to-date.  Your blood tests were brought up-to-date. Results will be sent to you as soon as they are available.  You were given a prednisone taper for your joint problems that we discussed. If they are not better in the 2 weeks of treatment, give me a call and we will arrange for physical therapy.  Warm regards, [employer]     Plan:   During the course of the visit Anabell was educated and counseled about the following appropriate screening and preventive services:   Vaccines to include Pneumoccal, Influenza, Td, Zostavax,  Colorectal cancer screening  Cardiovascular disease screening  Diabetes screening  Bone Denisty / Osteoporosis Screening  Mammogram  PAP  Glaucoma screening / Diabetic Eye Exam  Nutrition counseling  Smoking cessation counseling  Advanced Directives  Physical Activity   Goals    . Cut out extra servings    . Increase lean proteins    . Increase physical activity    . Weight (lb) < 200 lb (90.7 kg)      TDaP admiinistered  Patient Instructions (the written plan) were given to the patient.   Claretta Fraise, MD   09/03/2016

## 2016-09-03 NOTE — Patient Instructions (Signed)
It was a pleasure to see you today. Thanks for coming in.  Today year Medicare wellness visit revealed that you are overall quite healthy. Your mammogram is up-to-date.  Your blood tests were brought up-to-date. Results will be sent to you as soon as they are available.  You were given a prednisone taper for your joint problems that we discussed. If they are not better in the 2 weeks of treatment, give me a call and we will arrange for physical therapy.  Warm regards, [employer]

## 2016-09-04 LAB — VITAMIN D 25 HYDROXY (VIT D DEFICIENCY, FRACTURES): Vit D, 25-Hydroxy: 34.8 ng/mL (ref 30.0–100.0)

## 2016-09-04 LAB — LIPID PANEL
Chol/HDL Ratio: 3.6 ratio units (ref 0.0–4.4)
Cholesterol, Total: 165 mg/dL (ref 100–199)
HDL: 46 mg/dL (ref >39)
LDL Calculated: 97 mg/dL (ref 0–99)
Triglycerides: 110 mg/dL (ref 0–149)
VLDL Cholesterol Cal: 22 mg/dL (ref 5–40)

## 2016-09-04 LAB — CBC WITH DIFFERENTIAL/PLATELET
Basophils Absolute: 0 10*3/uL (ref 0.0–0.2)
Basos: 0 %
EOS (ABSOLUTE): 0.1 10*3/uL (ref 0.0–0.4)
Eos: 1 %
Hematocrit: 38 % (ref 34.0–46.6)
Hemoglobin: 13.1 g/dL (ref 11.1–15.9)
IMMATURE GRANULOCYTES: 0 %
Immature Grans (Abs): 0 10*3/uL (ref 0.0–0.1)
LYMPHS ABS: 2.2 10*3/uL (ref 0.7–3.1)
Lymphs: 24 %
MCH: 28.7 pg (ref 26.6–33.0)
MCHC: 34.5 g/dL (ref 31.5–35.7)
MCV: 83 fL (ref 79–97)
Monocytes Absolute: 0.7 10*3/uL (ref 0.1–0.9)
Monocytes: 7 %
Neutrophils Absolute: 6.3 10*3/uL (ref 1.4–7.0)
Neutrophils: 68 %
PLATELETS: 285 10*3/uL (ref 150–379)
RBC: 4.56 x10E6/uL (ref 3.77–5.28)
RDW: 14.8 % (ref 12.3–15.4)
WBC: 9.4 10*3/uL (ref 3.4–10.8)

## 2016-09-04 LAB — CMP14+EGFR
ALT: 15 IU/L (ref 0–32)
AST: 20 IU/L (ref 0–40)
Albumin/Globulin Ratio: 1.3 (ref 1.2–2.2)
Albumin: 4.2 g/dL (ref 3.6–4.8)
Alkaline Phosphatase: 96 IU/L (ref 39–117)
BUN/Creatinine Ratio: 21 (ref 12–28)
BUN: 19 mg/dL (ref 8–27)
Bilirubin Total: 0.9 mg/dL (ref 0.0–1.2)
CALCIUM: 10 mg/dL (ref 8.7–10.3)
CHLORIDE: 100 mmol/L (ref 96–106)
CO2: 28 mmol/L (ref 18–29)
Creatinine, Ser: 0.92 mg/dL (ref 0.57–1.00)
GFR calc Af Amer: 75 mL/min/{1.73_m2} (ref >59)
GFR, EST NON AFRICAN AMERICAN: 65 mL/min/{1.73_m2} (ref >59)
Globulin, Total: 3.3 g/dL (ref 1.5–4.5)
Glucose: 102 mg/dL — ABNORMAL HIGH (ref 65–99)
Potassium: 3.5 mmol/L (ref 3.5–5.2)
SODIUM: 145 mmol/L — AB (ref 134–144)
Total Protein: 7.5 g/dL (ref 6.0–8.5)

## 2016-09-04 LAB — HEPATITIS C ANTIBODY: Hep C Virus Ab: <0.1 s/co ratio (ref 0.0–0.9)

## 2016-09-27 DIAGNOSIS — E6609 Other obesity due to excess calories: Secondary | ICD-10-CM | POA: Diagnosis not present

## 2016-09-27 DIAGNOSIS — M5137 Other intervertebral disc degeneration, lumbosacral region: Secondary | ICD-10-CM | POA: Diagnosis not present

## 2016-09-27 DIAGNOSIS — M9902 Segmental and somatic dysfunction of thoracic region: Secondary | ICD-10-CM | POA: Diagnosis not present

## 2016-09-27 DIAGNOSIS — M9901 Segmental and somatic dysfunction of cervical region: Secondary | ICD-10-CM | POA: Diagnosis not present

## 2016-09-27 DIAGNOSIS — M9903 Segmental and somatic dysfunction of lumbar region: Secondary | ICD-10-CM | POA: Diagnosis not present

## 2016-09-29 DIAGNOSIS — M9903 Segmental and somatic dysfunction of lumbar region: Secondary | ICD-10-CM | POA: Diagnosis not present

## 2016-09-29 DIAGNOSIS — M5137 Other intervertebral disc degeneration, lumbosacral region: Secondary | ICD-10-CM | POA: Diagnosis not present

## 2016-09-29 DIAGNOSIS — M9902 Segmental and somatic dysfunction of thoracic region: Secondary | ICD-10-CM | POA: Diagnosis not present

## 2016-09-29 DIAGNOSIS — E6609 Other obesity due to excess calories: Secondary | ICD-10-CM | POA: Diagnosis not present

## 2016-09-29 DIAGNOSIS — M9901 Segmental and somatic dysfunction of cervical region: Secondary | ICD-10-CM | POA: Diagnosis not present

## 2016-09-30 DIAGNOSIS — M9901 Segmental and somatic dysfunction of cervical region: Secondary | ICD-10-CM | POA: Diagnosis not present

## 2016-09-30 DIAGNOSIS — M5137 Other intervertebral disc degeneration, lumbosacral region: Secondary | ICD-10-CM | POA: Diagnosis not present

## 2016-09-30 DIAGNOSIS — E6609 Other obesity due to excess calories: Secondary | ICD-10-CM | POA: Diagnosis not present

## 2016-09-30 DIAGNOSIS — M9902 Segmental and somatic dysfunction of thoracic region: Secondary | ICD-10-CM | POA: Diagnosis not present

## 2016-09-30 DIAGNOSIS — M9903 Segmental and somatic dysfunction of lumbar region: Secondary | ICD-10-CM | POA: Diagnosis not present

## 2016-10-08 ENCOUNTER — Ambulatory Visit (INDEPENDENT_AMBULATORY_CARE_PROVIDER_SITE_OTHER): Payer: Medicare Other

## 2016-10-08 DIAGNOSIS — Z23 Encounter for immunization: Secondary | ICD-10-CM | POA: Diagnosis not present

## 2016-10-14 DIAGNOSIS — H25813 Combined forms of age-related cataract, bilateral: Secondary | ICD-10-CM | POA: Diagnosis not present

## 2016-10-14 DIAGNOSIS — H43393 Other vitreous opacities, bilateral: Secondary | ICD-10-CM | POA: Diagnosis not present

## 2016-10-14 DIAGNOSIS — H04123 Dry eye syndrome of bilateral lacrimal glands: Secondary | ICD-10-CM | POA: Diagnosis not present

## 2016-10-14 DIAGNOSIS — H43811 Vitreous degeneration, right eye: Secondary | ICD-10-CM | POA: Diagnosis not present

## 2017-01-20 DIAGNOSIS — M5030 Other cervical disc degeneration, unspecified cervical region: Secondary | ICD-10-CM | POA: Diagnosis not present

## 2017-01-20 DIAGNOSIS — M9901 Segmental and somatic dysfunction of cervical region: Secondary | ICD-10-CM | POA: Diagnosis not present

## 2017-01-20 DIAGNOSIS — E6609 Other obesity due to excess calories: Secondary | ICD-10-CM | POA: Diagnosis not present

## 2017-01-20 DIAGNOSIS — M9904 Segmental and somatic dysfunction of sacral region: Secondary | ICD-10-CM | POA: Diagnosis not present

## 2017-01-20 DIAGNOSIS — M9902 Segmental and somatic dysfunction of thoracic region: Secondary | ICD-10-CM | POA: Diagnosis not present

## 2017-01-20 DIAGNOSIS — M9905 Segmental and somatic dysfunction of pelvic region: Secondary | ICD-10-CM | POA: Diagnosis not present

## 2017-01-20 DIAGNOSIS — M9903 Segmental and somatic dysfunction of lumbar region: Secondary | ICD-10-CM | POA: Diagnosis not present

## 2017-01-25 DIAGNOSIS — M9903 Segmental and somatic dysfunction of lumbar region: Secondary | ICD-10-CM | POA: Diagnosis not present

## 2017-01-25 DIAGNOSIS — E6609 Other obesity due to excess calories: Secondary | ICD-10-CM | POA: Diagnosis not present

## 2017-01-25 DIAGNOSIS — M9904 Segmental and somatic dysfunction of sacral region: Secondary | ICD-10-CM | POA: Diagnosis not present

## 2017-01-25 DIAGNOSIS — M9902 Segmental and somatic dysfunction of thoracic region: Secondary | ICD-10-CM | POA: Diagnosis not present

## 2017-01-25 DIAGNOSIS — M5030 Other cervical disc degeneration, unspecified cervical region: Secondary | ICD-10-CM | POA: Diagnosis not present

## 2017-01-25 DIAGNOSIS — M9905 Segmental and somatic dysfunction of pelvic region: Secondary | ICD-10-CM | POA: Diagnosis not present

## 2017-01-25 DIAGNOSIS — M9901 Segmental and somatic dysfunction of cervical region: Secondary | ICD-10-CM | POA: Diagnosis not present

## 2017-01-27 DIAGNOSIS — M9903 Segmental and somatic dysfunction of lumbar region: Secondary | ICD-10-CM | POA: Diagnosis not present

## 2017-01-27 DIAGNOSIS — M5030 Other cervical disc degeneration, unspecified cervical region: Secondary | ICD-10-CM | POA: Diagnosis not present

## 2017-01-27 DIAGNOSIS — M9901 Segmental and somatic dysfunction of cervical region: Secondary | ICD-10-CM | POA: Diagnosis not present

## 2017-01-27 DIAGNOSIS — M9904 Segmental and somatic dysfunction of sacral region: Secondary | ICD-10-CM | POA: Diagnosis not present

## 2017-01-27 DIAGNOSIS — M9905 Segmental and somatic dysfunction of pelvic region: Secondary | ICD-10-CM | POA: Diagnosis not present

## 2017-01-27 DIAGNOSIS — M9902 Segmental and somatic dysfunction of thoracic region: Secondary | ICD-10-CM | POA: Diagnosis not present

## 2017-01-27 DIAGNOSIS — E6609 Other obesity due to excess calories: Secondary | ICD-10-CM | POA: Diagnosis not present

## 2017-03-04 ENCOUNTER — Ambulatory Visit (INDEPENDENT_AMBULATORY_CARE_PROVIDER_SITE_OTHER): Payer: Medicare Other | Admitting: Family Medicine

## 2017-03-04 ENCOUNTER — Encounter: Payer: Self-pay | Admitting: Family Medicine

## 2017-03-04 VITALS — BP 98/63 | HR 76 | Temp 97.8°F | Ht 65.0 in | Wt 227.0 lb

## 2017-03-04 DIAGNOSIS — E782 Mixed hyperlipidemia: Secondary | ICD-10-CM | POA: Diagnosis not present

## 2017-03-04 DIAGNOSIS — M1990 Unspecified osteoarthritis, unspecified site: Secondary | ICD-10-CM | POA: Diagnosis not present

## 2017-03-04 DIAGNOSIS — L659 Nonscarring hair loss, unspecified: Secondary | ICD-10-CM | POA: Diagnosis not present

## 2017-03-04 DIAGNOSIS — I10 Essential (primary) hypertension: Secondary | ICD-10-CM

## 2017-03-04 MED ORDER — RANITIDINE HCL 150 MG PO TABS: 150.0000 mg | ORAL_TABLET | Freq: Two times a day (BID) | ORAL | 3 refills | Status: DC

## 2017-03-04 MED ORDER — VALSARTAN 160 MG PO TABS: 160.0000 mg | ORAL_TABLET | Freq: Every day | ORAL | 3 refills | Status: DC

## 2017-03-04 MED ORDER — PRAVASTATIN SODIUM 40 MG PO TABS: 40.0000 mg | ORAL_TABLET | Freq: Every morning | ORAL | 1 refills | Status: DC

## 2017-03-04 NOTE — Progress Notes (Signed)
Subjective:  Patient ID: Bethany [name], female    DOB: 09-Nov-1951  Age: 66 y.o. MRN: [id number]  CC: Hypertension (pt here today for routine follow up of her HTN and she is also c/o her hair thinning in areas and breaking off.)   HPI Bethany [name] presents for  follow-up of hypertension. Patient has no history of headache chest pain or shortness of breath or recent cough. Patient also denies symptoms of TIA such as numbness weakness lateralizing. Patient checks  blood pressure at home and has not had any elevated readings recently. Patient denies side effects from medication. States taking it regularly.   History Bethany [name] has a past medical history of Allergy (1997); Arthritis; GERD (gastroesophageal reflux disease); Hyperlipidemia; Hypertension; Obesity; Pneumonia; Seasonal allergies; and Sleep apnea.   She has a past surgical history that includes Abdominal hysterectomy; Tubal ligation; phlebectomy; Total knee arthroplasty (Right, 10/22/2013); Ganglion cyst excision; Breast surgery; and Total knee arthroplasty (Left, 06/11/2015).   Her family history includes Arthritis in her brother; Diabetes in her sister; Heart disease in her mother and sister.She reports that she has never smoked. She has never used smokeless tobacco. She reports that she does not drink alcohol or use drugs.  Current Outpatient Prescriptions on File Prior to Visit  Medication Sig Dispense Refill  . acyclovir (ZOVIRAX) 200 MG capsule TAKE 1 CAPSULE EVERY DAY AS NEEDED FOR  FLAREUPS 90 capsule 0  . meclizine (ANTIVERT) 25 MG tablet TAKE 1 TABLET THREE TIMES DAILY AS NEEDED  FOR  DIZZINESS 270 tablet 0   No current facility-administered medications on file prior to visit.     ROS Review of Systems  Constitutional: Negative for activity change, appetite change and fever.  HENT: Negative for congestion, rhinorrhea and sore throat.   Eyes: Negative for visual disturbance.  Respiratory: Negative for cough and shortness  of breath.   Cardiovascular: Negative for chest pain and palpitations.  Gastrointestinal: Negative for abdominal pain, diarrhea and nausea.  Genitourinary: Negative for dysuria.  Musculoskeletal: Positive for arthralgias and myalgias.  Skin:       Hair thinning. Loss of clumps noted     Objective:  BP 98/63   Pulse 76   Temp 97.8 F (36.6 C) (Oral)   Ht _0  (1.651 m)   Wt 227 lb (103 kg)   BMI 37.77 kg/m   BP Readings from Last 3 Encounters:  03/04/17 98/63  09/03/16 106/74  05/06/16 119/84    Wt Readings from Last 3 Encounters:  03/04/17 227 lb (103 kg)  09/03/16 227 lb 4 oz (103.1 kg)  05/06/16 228 lb 6.4 oz (103.6 kg)     Physical Exam  Constitutional: She is oriented to person, place, and time. She appears well-developed and well-nourished. No distress.  HENT:  Head: Normocephalic and atraumatic.  Right Ear: External ear normal.  Left Ear: External ear normal.  Nose: Nose normal.  Mouth/Throat: Oropharynx is clear and moist.  Eyes: Conjunctivae and EOM are normal. Pupils are equal, round, and reactive to light.  Neck: Normal range of motion. Neck supple. No thyromegaly present.  Cardiovascular: Normal rate, regular rhythm and normal heart sounds.   No murmur heard. Pulmonary/Chest: Effort normal and breath sounds normal. No respiratory distress. She has no wheezes. She has no rales.  Abdominal: Soft. Bowel sounds are normal. She exhibits no distension. There is no tenderness.  Lymphadenopathy:    She has no cervical adenopathy.  Neurological: She is alert and oriented to person, place, and time. She  has normal reflexes.  Skin: Skin is warm and dry.  Psychiatric: She has a normal mood and affect. Her behavior is normal. Judgment and thought content normal.     Lab Results  Component Value Date   WBC 8.6 03/04/2017   HGB 10.5 (L) 06/13/2015   HCT 39.7 03/04/2017   PLT 277 03/04/2017   GLUCOSE 93 03/04/2017   CHOL 147 03/04/2017   TRIG 97 03/04/2017     HDL 47 03/04/2017   LDLCALC 81 03/04/2017   ALT 13 03/04/2017   AST 22 03/04/2017   NA 141 03/04/2017   K 3.5 03/04/2017   CL 99 03/04/2017   CREATININE 0.83 03/04/2017   BUN 11 03/04/2017   CO2 26 03/04/2017   TSH 1.950 03/04/2017   INR 0.99 05/14/2015    No results found.  Assessment & Plan:   Bethany [name] was seen today for hypertension.  Diagnoses and all orders for this visit:  Essential hypertension -     CBC with Differential/Platelet -     CMP14+EGFR  Mixed hyperlipidemia -     CBC with Differential/Platelet -     CMP14+EGFR -     Lipid panel  Arthritis -     CBC with Differential/Platelet -     CMP14+EGFR  Alopecia -     CBC with Differential/Platelet -     CMP14+EGFR -     TSH  Other orders -     ranitidine (ZANTAC) 150 MG tablet; Take 1 tablet (150 mg total) by mouth 2 (two) times daily. For heartburn -     valsartan (DIOVAN) 160 MG tablet; Take 1 tablet (160 mg total) by mouth daily. For Blood pressure -     pravastatin (PRAVACHOL) 40 MG tablet; Take 1 tablet (40 mg total) by mouth every morning.   I have discontinued Bethany [name]'s omeprazole, valsartan-hydrochlorothiazide, and predniSONE. I am also having her start on ranitidine and valsartan. Additionally, I am having her maintain her meclizine, acyclovir, and pravastatin.  Meds ordered this encounter  Medications  . ranitidine (ZANTAC) 150 MG tablet    Sig: Take 1 tablet (150 mg total) by mouth 2 (two) times daily. For heartburn    Dispense:  90 tablet    Refill:  3  . valsartan (DIOVAN) 160 MG tablet    Sig: Take 1 tablet (160 mg total) by mouth daily. For Blood pressure    Dispense:  90 tablet    Refill:  3  . pravastatin (PRAVACHOL) 40 MG tablet    Sig: Take 1 tablet (40 mg total) by mouth every morning.    Dispense:  90 tablet    Refill:  1      Follow-up: Return in about 6 months (around 09/04/2017).  Claretta Fraise, M.D.

## 2017-03-05 LAB — CMP14+EGFR
A/G RATIO: 1.4 (ref 1.2–2.2)
ALBUMIN: 4.2 g/dL (ref 3.6–4.8)
ALK PHOS: 77 IU/L (ref 39–117)
ALT: 13 IU/L (ref 0–32)
AST: 22 IU/L (ref 0–40)
BILIRUBIN TOTAL: 1 mg/dL (ref 0.0–1.2)
BUN / CREAT RATIO: 13 (ref 12–28)
BUN: 11 mg/dL (ref 8–27)
CHLORIDE: 99 mmol/L (ref 96–106)
CO2: 26 mmol/L (ref 18–29)
CREATININE: 0.83 mg/dL (ref 0.57–1.00)
Calcium: 9.9 mg/dL (ref 8.7–10.3)
GFR calc Af Amer: 85 mL/min/{1.73_m2} (ref >59)
GFR calc non Af Amer: 74 mL/min/{1.73_m2} (ref >59)
GLOBULIN, TOTAL: 3 g/dL (ref 1.5–4.5)
Glucose: 93 mg/dL (ref 65–99)
POTASSIUM: 3.5 mmol/L (ref 3.5–5.2)
SODIUM: 141 mmol/L (ref 134–144)
Total Protein: 7.2 g/dL (ref 6.0–8.5)

## 2017-03-05 LAB — CBC WITH DIFFERENTIAL/PLATELET
Basophils Absolute: 0 10*3/uL (ref 0.0–0.2)
Basos: 0 %
EOS (ABSOLUTE): 0.1 10*3/uL (ref 0.0–0.4)
EOS: 1 %
HEMATOCRIT: 39.7 % (ref 34.0–46.6)
HEMOGLOBIN: 13.3 g/dL (ref 11.1–15.9)
Immature Grans (Abs): 0 10*3/uL (ref 0.0–0.1)
Immature Granulocytes: 0 %
LYMPHS ABS: 2.2 10*3/uL (ref 0.7–3.1)
Lymphs: 26 %
MCH: 28.9 pg (ref 26.6–33.0)
MCHC: 33.5 g/dL (ref 31.5–35.7)
MCV: 86 fL (ref 79–97)
MONOS ABS: 0.7 10*3/uL (ref 0.1–0.9)
Monocytes: 9 %
NEUTROS ABS: 5.5 10*3/uL (ref 1.4–7.0)
Neutrophils: 64 %
Platelets: 277 10*3/uL (ref 150–379)
RBC: 4.61 x10E6/uL (ref 3.77–5.28)
RDW: 15 % (ref 12.3–15.4)
WBC: 8.6 10*3/uL (ref 3.4–10.8)

## 2017-03-05 LAB — LIPID PANEL
CHOLESTEROL TOTAL: 147 mg/dL (ref 100–199)
Chol/HDL Ratio: 3.1 ratio units (ref 0.0–4.4)
HDL: 47 mg/dL (ref >39)
LDL CALC: 81 mg/dL (ref 0–99)
TRIGLYCERIDES: 97 mg/dL (ref 0–149)
VLDL Cholesterol Cal: 19 mg/dL (ref 5–40)

## 2017-03-05 LAB — TSH: TSH: 1.95 u[IU]/mL (ref 0.450–4.500)

## 2017-03-14 DIAGNOSIS — E6609 Other obesity due to excess calories: Secondary | ICD-10-CM | POA: Diagnosis not present

## 2017-03-14 DIAGNOSIS — M9902 Segmental and somatic dysfunction of thoracic region: Secondary | ICD-10-CM | POA: Diagnosis not present

## 2017-03-14 DIAGNOSIS — M9905 Segmental and somatic dysfunction of pelvic region: Secondary | ICD-10-CM | POA: Diagnosis not present

## 2017-03-14 DIAGNOSIS — M9904 Segmental and somatic dysfunction of sacral region: Secondary | ICD-10-CM | POA: Diagnosis not present

## 2017-03-14 DIAGNOSIS — M9901 Segmental and somatic dysfunction of cervical region: Secondary | ICD-10-CM | POA: Diagnosis not present

## 2017-03-14 DIAGNOSIS — M5030 Other cervical disc degeneration, unspecified cervical region: Secondary | ICD-10-CM | POA: Diagnosis not present

## 2017-03-14 DIAGNOSIS — M9903 Segmental and somatic dysfunction of lumbar region: Secondary | ICD-10-CM | POA: Diagnosis not present

## 2017-06-20 ENCOUNTER — Ambulatory Visit (INDEPENDENT_AMBULATORY_CARE_PROVIDER_SITE_OTHER): Payer: Medicare Other | Admitting: Family Medicine

## 2017-06-20 ENCOUNTER — Encounter: Payer: Self-pay | Admitting: Family Medicine

## 2017-06-20 VITALS — BP 128/80 | HR 68 | Temp 97.8°F | Ht 65.0 in | Wt 229.0 lb

## 2017-06-20 DIAGNOSIS — H608X1 Other otitis externa, right ear: Secondary | ICD-10-CM | POA: Diagnosis not present

## 2017-06-20 DIAGNOSIS — R202 Paresthesia of skin: Secondary | ICD-10-CM

## 2017-06-20 DIAGNOSIS — M654 Radial styloid tenosynovitis [de Quervain]: Secondary | ICD-10-CM | POA: Diagnosis not present

## 2017-06-20 DIAGNOSIS — L2082 Flexural eczema: Secondary | ICD-10-CM

## 2017-06-20 DIAGNOSIS — L600 Ingrowing nail: Secondary | ICD-10-CM | POA: Diagnosis not present

## 2017-06-20 MED ORDER — CIPROFLOXACIN-HYDROCORTISONE 0.2-1 % OT SUSP: 3.0000 [drp] | Freq: Two times a day (BID) | OTIC | 0 refills | Status: DC

## 2017-06-20 MED ORDER — ACYCLOVIR 200 MG PO CAPS: ORAL_CAPSULE | ORAL | 3 refills | Status: DC

## 2017-06-20 MED ORDER — DICLOFENAC SODIUM 3 % EX CREA: 1.0000 "application " | TOPICAL_CREAM | Freq: Four times a day (QID) | CUTANEOUS | 5 refills | Status: DC | PRN

## 2017-06-20 MED ORDER — FLUOCINONIDE-E 0.05 % EX CREA: 1.0000 "application " | TOPICAL_CREAM | Freq: Two times a day (BID) | CUTANEOUS | 5 refills | Status: DC

## 2017-06-20 NOTE — Progress Notes (Signed)
Subjective:  Patient ID: Bethany [name], female    DOB: [birth year]  Age: 66 y.o. MRN: [id number]  CC: Rash (pt here today c/o rash under left arm that is itching badly. She also has multiple other complaints she would like to discuss.)   HPI Bethany [name] presents for 2 weeks of a pruritic rash under the left breast. She's never been able to wear antiperspirants because of sensitive skin. She uses a special deodorant she calls Crystal stick. Even that apparently is irritating her underarm now and the sensation is becoming more more pruritic and radiating toward the left breast laterally. She has been out in the heat just a little more than usual. Of note is that she is also having some discomfort around the incisions for both knee replacements. It is a sense of burning and tingling at times. She has a friend who uses full tear and gel. She borrowed some and it worked well. She did like to have some Voltaren gel. Additionally she has pain in both thumbs at the base bilaterally. No known injury. No repetitive use noted. No headache at the first MCP bilaterally though. The left seems to be worse than the right. She also has an ingrown toenail on the left first toe she does like looked at. It is rather sore. Not red or swollen. She's been soaking it in warm water. Next is she has some itching in the right ear canal. This is been ongoing intermittently for several weeks. No change noted in level of hearing.   History Bethany [name] has a past medical history of Allergy (1997); Arthritis; GERD (gastroesophageal reflux disease); Hyperlipidemia; Hypertension; Obesity; Pneumonia; Seasonal allergies; and Sleep apnea.   She has a past surgical history that includes Abdominal hysterectomy; Tubal ligation; phlebectomy; Total knee arthroplasty (Right, 10/22/2013); Ganglion cyst excision; Breast surgery; and Total knee arthroplasty (Left, 06/11/2015).   Her family history includes Arthritis in her brother; Diabetes in  her sister; Heart disease in her mother and sister.She reports that she has never smoked. She has never used smokeless tobacco. She reports that she does not drink alcohol or use drugs.    ROS Review of Systems  Constitutional: Negative for activity change, appetite change and fever.  HENT: Positive for ear discharge and ear pain. Negative for congestion, rhinorrhea and sore throat.   Eyes: Negative for visual disturbance.  Respiratory: Negative for cough and shortness of breath.   Cardiovascular: Negative for chest pain and palpitations.  Gastrointestinal: Negative for abdominal pain, diarrhea and nausea.  Genitourinary: Negative for dysuria.  Musculoskeletal: Positive for arthralgias and myalgias.  Skin: Positive for rash.    Objective:  BP 128/80   Pulse 68   Temp 97.8 F (36.6 C) (Oral)   Ht 5\' 5"  (1.651 m)   Wt 229 lb (103.9 kg)   BMI 38.11 kg/m   BP Readings from Last 3 Encounters:  06/20/17 128/80  03/04/17 98/63  09/03/16 106/74    Wt Readings from Last 3 Encounters:  06/20/17 229 lb (103.9 kg)  03/04/17 227 lb (103 kg)  09/03/16 227 lb 4 oz (103.1 kg)     Physical Exam  Constitutional: She is oriented to person, place, and time. She appears well-developed and well-nourished. No distress.  HENT:  Head: Normocephalic and atraumatic.  Right Ear: Hearing and external ear normal. No drainage (however there is some slight scaling noted).  Left Ear: Hearing and external ear normal.  Nose: Nose normal.  Mouth/Throat: Oropharynx is clear and moist.  Eyes:  Conjunctivae and EOM are normal. Pupils are equal, round, and reactive to light.  Neck: Normal range of motion. Neck supple. No thyromegaly present.  Cardiovascular: Normal rate, regular rhythm and normal heart sounds.   No murmur heard. Pulmonary/Chest: Effort normal and breath sounds normal. No respiratory distress. She has no wheezes. She has no rales.  Abdominal: Soft. Bowel sounds are normal. She exhibits no  distension. There is no tenderness.  Lymphadenopathy:    She has no cervical adenopathy.  Neurological: She is alert and oriented to person, place, and time. She has normal reflexes.  Skin: Skin is warm and dry.  Lichenified eruption at the left axilla with mild erythema extending into the upper left breast.  The left first toe has medial ingrown component. No inflammation noted.  Psychiatric: She has a normal mood and affect. Her behavior is normal. Judgment and thought content normal.      Assessment & Plan:   Bethany [name] was seen today for rash.  Diagnoses and all orders for this visit:  Chronic eczematous otitis externa of right ear  Paresthesias  Flexural eczema  De Quervain's syndrome (tenosynovitis)  Ingrown toenail  Other orders -     fluocinonide-emollient (LIDEX-E) 0.05 % cream; Apply 1 application topically 2 (two) times daily. To affected areas -     ciprofloxacin-hydrocortisone (CIPRO HC OTIC) OTIC suspension; Place 3 drops into both ears 2 (two) times daily. For one week -     Diclofenac Sodium 3 % CREA; Apply 1 application topically 4 (four) times daily as needed. 2 knees and the base of her thumbs -     acyclovir (ZOVIRAX) 200 MG capsule; TAKE 1 CAPSULE EVERY DAY AS NEEDED FOR  FLAREUPS    Patient was recommended to see her podiatrist, Dr. Irving Shows for the ingrown toenail. She'll use the Lidex on the axilla and Cipro HC in the ear canal. She'll wear a thumb spica splint for the de Quervain's syndrome of the thumbs. Voltaren gel for the aches and pains associated with her previous knee surgeries.   I am having Bethany [name] start on fluocinonide-emollient, ciprofloxacin-hydrocortisone, and Diclofenac Sodium. I am also having her maintain her meclizine, ranitidine, valsartan, pravastatin, and acyclovir.  Allergies as of 06/20/2017   No Known Allergies     Medication List       Accurate as of 06/20/17  5:55 PM. Always use your most recent med list.            acyclovir 200 MG capsule Commonly known as:  ZOVIRAX TAKE 1 CAPSULE EVERY DAY AS NEEDED FOR  FLAREUPS   ciprofloxacin-hydrocortisone OTIC suspension Commonly known as:  CIPRO HC OTIC Place 3 drops into both ears 2 (two) times daily. For one week   Diclofenac Sodium 3 % Crea Apply 1 application topically 4 (four) times daily as needed. 2 knees and the base of her thumbs   fluocinonide-emollient 0.05 % cream Commonly known as:  LIDEX-E Apply 1 application topically 2 (two) times daily. To affected areas   meclizine 25 MG tablet Commonly known as:  ANTIVERT TAKE 1 TABLET THREE TIMES DAILY AS NEEDED  FOR  DIZZINESS   pravastatin 40 MG tablet Commonly known as:  PRAVACHOL Take 1 tablet (40 mg total) by mouth every morning.   ranitidine 150 MG tablet Commonly known as:  ZANTAC Take 1 tablet (150 mg total) by mouth 2 (two) times daily. For heartburn   valsartan 160 MG tablet Commonly known as:  DIOVAN Take 1 tablet (160 mg  total) by mouth daily. For Blood pressure        Follow-up: No Follow-up on file.  Claretta Fraise, M.D.

## 2017-06-21 DIAGNOSIS — M9901 Segmental and somatic dysfunction of cervical region: Secondary | ICD-10-CM | POA: Diagnosis not present

## 2017-06-21 DIAGNOSIS — M5412 Radiculopathy, cervical region: Secondary | ICD-10-CM | POA: Diagnosis not present

## 2017-06-24 ENCOUNTER — Telehealth: Payer: Self-pay

## 2017-06-24 NOTE — Telephone Encounter (Signed)
I sent in the requested prescription 

## 2017-06-24 NOTE — Telephone Encounter (Signed)
Pt notified of RX change

## 2017-06-24 NOTE — Telephone Encounter (Signed)
INsurance denied prior auth for Cipro Otic  Preferred are neomycin-polymyxin-hydrocort ear drop susp., ofloxacin ear drops

## 2017-06-24 NOTE — Telephone Encounter (Signed)
Insurance denied prior auth for Diclofenac gel

## 2017-07-13 ENCOUNTER — Other Ambulatory Visit: Payer: Self-pay | Admitting: Family Medicine

## 2017-07-13 ENCOUNTER — Telehealth: Payer: Self-pay | Admitting: Family Medicine

## 2017-07-13 MED ORDER — OLMESARTAN MEDOXOMIL 20 MG PO TABS: 20.0000 mg | ORAL_TABLET | Freq: Every day | ORAL | 2 refills | Status: DC

## 2017-07-13 NOTE — Telephone Encounter (Signed)
Left detailed message regarding RX 

## 2017-07-13 NOTE — Telephone Encounter (Signed)
I sent in the requested prescription 

## 2017-08-08 ENCOUNTER — Encounter: Payer: Self-pay | Admitting: Family

## 2017-08-08 ENCOUNTER — Ambulatory Visit (INDEPENDENT_AMBULATORY_CARE_PROVIDER_SITE_OTHER): Payer: Medicare Other | Admitting: Family

## 2017-08-08 VITALS — BP 145/97 | HR 73 | Temp 98.8°F | Ht 65.0 in | Wt 227.4 lb

## 2017-08-08 DIAGNOSIS — J301 Allergic rhinitis due to pollen: Secondary | ICD-10-CM

## 2017-08-08 DIAGNOSIS — B372 Candidiasis of skin and nail: Secondary | ICD-10-CM | POA: Diagnosis not present

## 2017-08-08 MED ORDER — CLOTRIMAZOLE-BETAMETHASONE 1-0.05 % EX CREA: 1.0000 "application " | TOPICAL_CREAM | Freq: Two times a day (BID) | CUTANEOUS | 0 refills | Status: DC

## 2017-08-08 MED ORDER — NYSTATIN 100000 UNIT/GM EX POWD: Freq: Four times a day (QID) | CUTANEOUS | 0 refills | Status: DC

## 2017-08-08 MED ORDER — FLUTICASONE PROPIONATE 50 MCG/ACT NA SUSP: 2.0000 | Freq: Every day | NASAL | 6 refills | Status: DC

## 2017-08-08 NOTE — Progress Notes (Signed)
   Subjective:    Patient ID: Bethany [name], female    DOB: [birth year], 66 y.o.   MRN: [id number]  Rash  This is a new problem. The current episode started 1 to 4 weeks ago. The problem is unchanged. The affected locations include the left axilla (bilateral breast). The rash is characterized by itchiness and redness. She was exposed to nothing. Past treatments include anti-itch cream.      Review of Systems  Skin: Positive for rash.  All other systems reviewed and are negative.      Objective:   Physical Exam  Constitutional: She is oriented to person, place, and time. She appears well-developed and well-nourished. No distress.  HENT:  Head: Normocephalic and atraumatic.  Right Ear: External ear normal.  Left Ear: External ear normal.  Nose: Mucosal edema and rhinorrhea present.  Mouth/Throat: Oropharynx is clear and moist.  Eyes: Pupils are equal, round, and reactive to light.  Neck: Normal range of motion. Neck supple. No thyromegaly present.  Cardiovascular: Normal rate, regular rhythm, normal heart sounds and intact distal pulses.   No murmur heard. Pulmonary/Chest: Effort normal and breath sounds normal. No respiratory distress. She has no wheezes.  Abdominal: Soft. Bowel sounds are normal. She exhibits no distension. There is no tenderness.  Musculoskeletal: Normal range of motion. She exhibits no edema or tenderness.  Neurological: She is alert and oriented to person, place, and time.  Skin: Skin is warm and dry. Rash noted.  Psychiatric: She has a normal mood and affect. Her behavior is normal. Judgment and thought content normal.  Vitals reviewed.     BP (!) 145/97   Pulse 73   Temp 98.8 F (37.1 C) (Oral)   Ht 5\' 5"  (1.651 m)   Wt 227 lb 6.4 oz (103.1 kg)   BMI 37.84 kg/m      Assessment & Plan:  1. Candidiasis of skin Keep clean and dry  Do not scratch Start daily probiotic  Encourage healthy weight and diet - nystatin (MYCOSTATIN/NYSTOP) powder;  Apply topically 4 (four) times daily.  Dispense: 15 g; Refill: 0 - clotrimazole-betamethasone (LOTRISONE) cream; Apply 1 application topically 2 (two) times daily.  Dispense: 60 g; Refill: 0  2. Allergic rhinitis due to pollen, unspecified seasonality - fluticasone (FLONASE) 50 MCG/ACT nasal spray; Place 2 sprays into both nostrils daily.  Dispense: 16 g; Refill: [place], FNP

## 2017-08-08 NOTE — Patient Instructions (Signed)
Skin Yeast Infection Skin yeast infection is a condition in which there is an overgrowth of yeast (candida) that normally lives on the skin. This condition usually occurs in areas of the skin that are constantly warm and moist, such as the armpits or the groin. What are the causes? This condition is caused by a change in the normal balance of the yeast and bacteria that live on the skin. What increases the risk? This condition is more likely to develop in:  People who are obese.  Pregnant women.  Women who take birth control pills.  People who have diabetes.  People who take antibiotic medicines.  People who take steroid medicines.  People who are malnourished.  People who have a weak defense (immune) system.  People who are 65 years of age or older.  What are the signs or symptoms? Symptoms of this condition include:  A red, swollen area of the skin.  Bumps on the skin.  Itchiness.  How is this diagnosed? This condition is diagnosed with a medical history and physical exam. Your health care provider may check for yeast by taking light scrapings of the skin to be viewed under a microscope. How is this treated? This condition is treated with medicine. Medicines may be prescribed or be available over-the-counter. The medicines may be:  Taken by mouth (orally).  Applied as a cream.  Follow these instructions at home:  Take or apply over-the-counter and prescription medicines only as told by your health care provider.  Eat more yogurt. This may help to keep your yeast infection from returning.  Maintain a healthy weight. If you need help losing weight, talk with your health care provider.  Keep your skin clean and dry.  If you have diabetes, keep your blood sugar under control. Contact a health care provider if:  Your symptoms go away and then return.  Your symptoms do not get better with treatment.  Your symptoms get worse.  Your rash spreads.  You have a  fever or chills.  You have new symptoms.  You have new warmth or redness of your skin. This information is not intended to replace advice given to you by your health care provider. Make sure you discuss any questions you have with your health care provider. Document Released: 08/31/2011 Document Revised: 08/08/2016 Document Reviewed: 06/16/2015 Elsevier Interactive Patient Education  2018 Elsevier Inc.  

## 2017-08-12 ENCOUNTER — Other Ambulatory Visit: Payer: Self-pay | Admitting: Family Medicine

## 2017-08-12 ENCOUNTER — Telehealth: Payer: Self-pay | Admitting: Family Medicine

## 2017-08-12 MED ORDER — OLMESARTAN MEDOXOMIL-HCTZ 20-12.5 MG PO TABS: 1.0000 | ORAL_TABLET | Freq: Every day | ORAL | 3 refills | Status: DC

## 2017-08-12 NOTE — Telephone Encounter (Signed)
Pt aware.

## 2017-08-12 NOTE — Telephone Encounter (Signed)
Please contact the patient that her know I added the fluid pill back to the almost certain similar to how it had been in the valsartan. I sent that prescription to her pharmacy

## 2017-08-12 NOTE — Telephone Encounter (Signed)
Please advise and route to Pool B 

## 2017-08-17 DIAGNOSIS — M9901 Segmental and somatic dysfunction of cervical region: Secondary | ICD-10-CM | POA: Diagnosis not present

## 2017-08-17 DIAGNOSIS — M5412 Radiculopathy, cervical region: Secondary | ICD-10-CM | POA: Diagnosis not present

## 2017-09-06 ENCOUNTER — Ambulatory Visit (INDEPENDENT_AMBULATORY_CARE_PROVIDER_SITE_OTHER): Payer: Medicare Other | Admitting: Family Medicine

## 2017-09-06 ENCOUNTER — Encounter: Payer: Self-pay | Admitting: Family Medicine

## 2017-09-06 VITALS — BP 134/85 | HR 75 | Temp 98.5°F | Ht 65.0 in | Wt 224.0 lb

## 2017-09-06 DIAGNOSIS — B369 Superficial mycosis, unspecified: Secondary | ICD-10-CM

## 2017-09-06 DIAGNOSIS — I10 Essential (primary) hypertension: Secondary | ICD-10-CM

## 2017-09-06 DIAGNOSIS — L659 Nonscarring hair loss, unspecified: Secondary | ICD-10-CM

## 2017-09-06 MED ORDER — OLMESARTAN MEDOXOMIL 20 MG PO TABS: 20.0000 mg | ORAL_TABLET | Freq: Every day | ORAL | 1 refills | Status: DC

## 2017-09-06 MED ORDER — FUROSEMIDE 20 MG PO TABS: 20.0000 mg | ORAL_TABLET | ORAL | 1 refills | Status: DC

## 2017-09-06 NOTE — Progress Notes (Signed)
Subjective:  Patient ID: Bethany [name], female    DOB: [birth year]  Age: 66 y.o. MRN: [id number]  CC: Hypertension (pt here today for routine follow up of her chronic medical conditions, no other concerns voiced.)   [name] presents for  follow-up of hypertension. Patient has no history of headache chest pain or shortness of breath or recent cough. Patient also denies symptoms of TIA such as focal numbness or weakness. Patient States that HCTZ is causing hair loss. It has done this before. However, without it she swells.denies side effects from medication. States taking it regularly.Patient also says that antifungal cream worked for axillary eruption, but lidex did not, indicating rash was fungal.   History Bethany [name] has a past medical history of Allergy (1997); Arthritis; GERD (gastroesophageal reflux disease); Hyperlipidemia; Hypertension; Obesity; Pneumonia; Seasonal allergies; and Sleep apnea.   She has a past surgical history that includes Abdominal hysterectomy; Tubal ligation; phlebectomy; Total knee arthroplasty (Right, 10/22/2013); Ganglion cyst excision; Breast surgery; and Total knee arthroplasty (Left, 06/11/2015).   Her family history includes Arthritis in her brother; Diabetes in her sister; Heart disease in her mother and sister.She reports that she has never smoked. She has never used smokeless tobacco. She reports that she does not drink alcohol or use drugs.  Current Outpatient Prescriptions on File Prior to Visit  Medication Sig Dispense Refill  . acyclovir (ZOVIRAX) 200 MG capsule TAKE 1 CAPSULE EVERY DAY AS NEEDED FOR  FLAREUPS 90 capsule 3  . clotrimazole-betamethasone (LOTRISONE) cream Apply 1 application topically 2 (two) times daily. 60 g 0  . Fluocinonide Emulsified Base 0.05 % CREA     . fluticasone (FLONASE) 50 MCG/ACT nasal spray Place 2 sprays into both nostrils daily. 16 g 6  . meclizine (ANTIVERT) 25 MG tablet TAKE 1 TABLET THREE TIMES DAILY AS NEEDED   FOR  DIZZINESS 270 tablet 0  . nystatin (MYCOSTATIN/NYSTOP) powder Apply topically 4 (four) times daily. 15 g 0  . pravastatin (PRAVACHOL) 40 MG tablet Take 1 tablet (40 mg total) by mouth every morning. 90 tablet 1  . ranitidine (ZANTAC) 150 MG tablet Take 1 tablet (150 mg total) by mouth 2 (two) times daily. For heartburn 90 tablet 3   No current facility-administered medications on file prior to visit.     ROS Review of Systems  Constitutional: Negative for activity change, appetite change and fever.  HENT: Negative for congestion, rhinorrhea and sore throat.   Eyes: Negative for visual disturbance.  Respiratory: Negative for cough and shortness of breath.   Cardiovascular: Negative for chest pain and palpitations.  Gastrointestinal: Negative for abdominal pain, diarrhea and nausea.  Genitourinary: Negative for dysuria.  Musculoskeletal: Negative for arthralgias and myalgias.    Objective:  BP 134/85   Pulse 75   Temp 98.5 F (36.9 C) (Oral)   Ht 5\' 5"  (1.651 m)   Wt 224 lb (101.6 kg)   BMI 37.28 kg/m   BP Readings from Last 3 Encounters:  09/06/17 134/85  08/08/17 (!) 145/97  06/20/17 128/80    Wt Readings from Last 3 Encounters:  09/06/17 224 lb (101.6 kg)  08/08/17 227 lb 6.4 oz (103.1 kg)  06/20/17 229 lb (103.9 kg)     Physical Exam  Constitutional: She is oriented to person, place, and time. She appears well-developed and well-nourished. No distress.  HENT:  Head: Normocephalic and atraumatic.  Right Ear: External ear normal.  Left Ear: External ear normal.  Nose: Nose normal.  Mouth/Throat: Oropharynx is clear  and moist.  Eyes: Pupils are equal, round, and reactive to light. Conjunctivae and EOM are normal.  Neck: Normal range of motion. Neck supple. No thyromegaly present.  Cardiovascular: Normal rate, regular rhythm and normal heart sounds.   No murmur heard. Pulmonary/Chest: Effort normal and breath sounds normal. No respiratory distress. She has no  wheezes. She has no rales.  Abdominal: Soft. Bowel sounds are normal. She exhibits no distension. There is no tenderness.  Lymphadenopathy:    She has no cervical adenopathy.  Neurological: She is alert and oriented to person, place, and time. She has normal reflexes.  Skin: Skin is warm and dry.  Psychiatric: She has a normal mood and affect. Her behavior is normal. Judgment and thought content normal.      Assessment & Plan:   [name] was seen today for hypertension.  Diagnoses and all orders for this visit:  Essential hypertension  Hair loss  Fungal dermatitis  Other orders -     furosemide (LASIX) 20 MG tablet; Take 1 tablet (20 mg total) by mouth every morning. -     olmesartan (BENICAR) 20 MG tablet; Take 1 tablet (20 mg total) by mouth daily.   Allergies as of 09/06/2017      Reactions   Hctz [hydrochlorothiazide]    Hair loss      Medication List       Accurate as of 09/06/17  6:24 PM. Always use your most recent med list.          acyclovir 200 MG capsule Commonly known as:  ZOVIRAX TAKE 1 CAPSULE EVERY DAY AS NEEDED FOR  FLAREUPS   clotrimazole-betamethasone cream Commonly known as:  LOTRISONE Apply 1 application topically 2 (two) times daily.   Fluocinonide Emulsified Base 0.05 % Crea   fluticasone 50 MCG/ACT nasal spray Commonly known as:  FLONASE Place 2 sprays into both nostrils daily.   furosemide 20 MG tablet Commonly known as:  LASIX Take 1 tablet (20 mg total) by mouth every morning.   meclizine 25 MG tablet Commonly known as:  ANTIVERT TAKE 1 TABLET THREE TIMES DAILY AS NEEDED  FOR  DIZZINESS   nystatin powder Commonly known as:  MYCOSTATIN/NYSTOP Apply topically 4 (four) times daily.   olmesartan 20 MG tablet Commonly known as:  BENICAR Take 1 tablet (20 mg total) by mouth daily.   pravastatin 40 MG tablet Commonly known as:  PRAVACHOL Take 1 tablet (40 mg total) by mouth every morning.   ranitidine 150 MG tablet Commonly  known as:  ZANTAC Take 1 tablet (150 mg total) by mouth 2 (two) times daily. For heartburn            Discharge Care Instructions        Start     Ordered   09/06/17 0000  furosemide (LASIX) 20 MG tablet  BH-each morning     09/06/17 0906   09/06/17 0000  olmesartan (BENICAR) 20 MG tablet  Daily     09/06/17 0906      Meds ordered this encounter  Medications  . furosemide (LASIX) 20 MG tablet    Sig: Take 1 tablet (20 mg total) by mouth every morning.    Dispense:  90 tablet    Refill:  1  . olmesartan (BENICAR) 20 MG tablet    Sig: Take 1 tablet (20 mg total) by mouth daily.    Dispense:  90 tablet    Refill:  1   Blood pressure and swelling should be controlled  by the combination of furosemide and Lasix without the risk of hair loss caused by the HCTZ.  Follow-up: Return in about 6 months (around 03/06/2018).  Claretta Fraise, M.D.

## 2017-09-21 ENCOUNTER — Ambulatory Visit (INDEPENDENT_AMBULATORY_CARE_PROVIDER_SITE_OTHER): Payer: Medicare Other

## 2017-09-21 ENCOUNTER — Ambulatory Visit: Payer: Medicare Other

## 2017-09-21 DIAGNOSIS — Z23 Encounter for immunization: Secondary | ICD-10-CM | POA: Diagnosis not present

## 2017-09-21 NOTE — Addendum Note (Signed)
Addended by: Torrie Mayers on: 09/21/2017 10:24 AM   Modules accepted: Orders

## 2017-09-21 NOTE — Progress Notes (Signed)
Hd flu and pneunmovax given pt tolerated well

## 2017-10-10 DIAGNOSIS — M9901 Segmental and somatic dysfunction of cervical region: Secondary | ICD-10-CM | POA: Diagnosis not present

## 2017-10-10 DIAGNOSIS — M5412 Radiculopathy, cervical region: Secondary | ICD-10-CM | POA: Diagnosis not present

## 2017-10-29 DIAGNOSIS — H04123 Dry eye syndrome of bilateral lacrimal glands: Secondary | ICD-10-CM | POA: Diagnosis not present

## 2017-10-29 DIAGNOSIS — H40033 Anatomical narrow angle, bilateral: Secondary | ICD-10-CM | POA: Diagnosis not present

## 2017-11-08 ENCOUNTER — Ambulatory Visit (INDEPENDENT_AMBULATORY_CARE_PROVIDER_SITE_OTHER): Payer: Medicare Other | Admitting: Family Medicine

## 2017-11-08 ENCOUNTER — Encounter: Payer: Self-pay | Admitting: Family Medicine

## 2017-11-08 VITALS — BP 139/91 | HR 67 | Temp 97.9°F | Ht 65.0 in | Wt 226.2 lb

## 2017-11-08 DIAGNOSIS — F4323 Adjustment disorder with mixed anxiety and depressed mood: Secondary | ICD-10-CM

## 2017-11-08 MED ORDER — DULOXETINE HCL 30 MG PO CPEP: 30.0000 mg | ORAL_CAPSULE | Freq: Every day | ORAL | 1 refills | Status: DC

## 2017-11-08 MED ORDER — PRAVASTATIN SODIUM 40 MG PO TABS: 40.0000 mg | ORAL_TABLET | Freq: Every morning | ORAL | 0 refills | Status: DC

## 2017-11-08 NOTE — Progress Notes (Signed)
Subjective:  Patient ID: Bethany [name], female    DOB: 10/04/1951  Age: 66 y.o. MRN: [id number]  CC: Anxiety (husband just retired, just to get over hump, not long term)   HPI [name] presents for feeling anxious and depressed over the extra time she is spending with her husband now.  Since he came out of work he is more domineering and irritable.  She has been looking forward to retirement since she already retired.  This was supposed to be a golden time but he was always harsh with their kids growing up and he had it out with 1 of their daughters several months ago now he is being quite hard on the patient.  She feels like it is just an adjustment and that they will get past it but she does not know how meanwhile she has been quite down frustrated tired losing her appetite feeling bad about herself and things around her and out of control of her situation.  She is always been able to calm the kids down and intervene between her husband and her adult children but now she seems to be more the focus of his ire.  Depression screen [facility] 2/9 11/08/2017 09/06/2017 08/08/2017  Decreased Interest 3 0 0  Down, Depressed, Hopeless 3 0 0  PHQ - 2 Score 6 0 0  Altered sleeping 3 - -  Tired, decreased energy 3 - -  Change in appetite 3 - -  Feeling bad or failure about yourself  3 - -  Trouble concentrating 3 - -  Moving slowly or fidgety/restless 3 - -  Suicidal thoughts 0 - -  PHQ-9 Score 24 - -    History Jesus has a past medical history of Allergy (1997), Arthritis, GERD (gastroesophageal reflux disease), Hyperlipidemia, Hypertension, Obesity, Pneumonia, Seasonal allergies, and Sleep apnea.   She has a past surgical history that includes Abdominal hysterectomy; Tubal ligation; phlebectomy; Ganglion cyst excision; Breast surgery; LEFT TOTAL KNEE ARTHROPLASTY (Left, 06/11/2015); and RIGHT TOTAL KNEE ARTHROPLASTY (Right, 10/22/2013).   Her family history includes Arthritis in her brother;  Diabetes in her sister; Heart disease in her mother and sister.She reports that  has never smoked. she has never used smokeless tobacco. She reports that she does not drink alcohol or use drugs.    ROS Review of Systems  Constitutional: Negative for fever.  HENT: Negative for congestion, rhinorrhea and sore throat.   Respiratory: Negative for cough and shortness of breath.   Cardiovascular: Negative for chest pain and palpitations.  Gastrointestinal: Negative for abdominal pain.  Musculoskeletal: Negative for arthralgias and myalgias.    Objective:  BP (!) 139/91 (BP Location: Right Arm, Patient Position: Sitting, Cuff Size: Large)   Pulse 67   Temp 97.9 F (36.6 C) (Oral)   Ht 5\' 5"  (1.651 m)   Wt 226 lb 3.2 oz (102.6 kg)   BMI 37.64 kg/m   BP Readings from Last 3 Encounters:  11/08/17 (!) 139/91  09/06/17 134/85  08/08/17 (!) 145/97    Wt Readings from Last 3 Encounters:  11/08/17 226 lb 3.2 oz (102.6 kg)  09/06/17 224 lb (101.6 kg)  08/08/17 227 lb 6.4 oz (103.1 kg)     Physical Exam  Constitutional: She is oriented to person, place, and time. She appears well-developed and well-nourished. No distress.  Cardiovascular: Normal rate and regular rhythm.  Pulmonary/Chest: Breath sounds normal.  Neurological: She is alert and oriented to person, place, and time.  Skin: Skin is warm and dry.  Psychiatric: She has a normal mood and affect. Her behavior is normal. Thought content normal.      Assessment & Plan:   Annita was seen today for anxiety.  Diagnoses and all orders for this visit:  Adjustment disorder with mixed anxiety and depressed mood  Other orders -     pravastatin (PRAVACHOL) 40 MG tablet; Take 1 tablet (40 mg total) every morning by mouth. -     DULoxetine (CYMBALTA) 30 MG capsule; Take 1 capsule (30 mg total) daily with supper by mouth. For one week, then 2 capsules (60 mg) at supper time   We discussed several coping strategies.  However, the  most important strategy at this point is that she and he should be an marital counseling.  This should be the most enjoyable time of their entire marriage and it does not need to be wasted with conflict.    I have discontinued Leontine Sampey's [place]. I have also changed her pravastatin. Additionally, I am having her start on DULoxetine. Lastly, I am having her maintain her meclizine, ranitidine, acyclovir, nystatin, clotrimazole-betamethasone, fluticasone, furosemide, and olmesartan.  Allergies as of 11/08/2017      Reactions   Hctz [hydrochlorothiazide]    Hair loss      Medication List        Accurate as of 11/08/17  2:14 PM. Always use your most recent med list.          acyclovir 200 MG capsule Commonly known as:  ZOVIRAX TAKE 1 CAPSULE EVERY DAY AS NEEDED FOR  FLAREUPS   clotrimazole-betamethasone cream Commonly known as:  LOTRISONE Apply 1 application topically 2 (two) times daily.   DULoxetine 30 MG capsule Commonly known as:  CYMBALTA Take 1 capsule (30 mg total) daily with supper by mouth. For one week, then 2 capsules (60 mg) at supper time   fluticasone 50 MCG/ACT nasal spray Commonly known as:  FLONASE Place 2 sprays into both nostrils daily.   furosemide 20 MG tablet Commonly known as:  LASIX Take 1 tablet (20 mg total) by mouth every morning.   meclizine 25 MG tablet Commonly known as:  ANTIVERT TAKE 1 TABLET THREE TIMES DAILY AS NEEDED  FOR  DIZZINESS   nystatin powder Commonly known as:  MYCOSTATIN/NYSTOP Apply topically 4 (four) times daily.   olmesartan 20 MG tablet Commonly known as:  BENICAR Take 1 tablet (20 mg total) by mouth daily.   pravastatin 40 MG tablet Commonly known as:  PRAVACHOL Take 1 tablet (40 mg total) every morning by mouth.   ranitidine 150 MG tablet Commonly known as:  ZANTAC Take 1 tablet (150 mg total) by mouth 2 (two) times daily. For heartburn        Follow-up: Return in about 4 weeks  (around 12/06/2017).  Claretta Fraise, M.D.

## 2017-11-08 NOTE — Patient Instructions (Signed)
Your provider wants you to schedule an appointment with a Psychologist/Psychiatrist. The following list of offices requires the patient to call and make their own appointment, as there is information they need that only you can provide. Please feel free to choose form the following providers:  [facility]   [phone] Crisis Recovery in [place] [phone]  [facility]  [phone]   [address] [place], [place]  (Scheduled through [facility]) Must call and do an interview for appointment. Sees Children / Accepts Medicaid  [facility]    [phone]  [address], [address]    [place], [place]       [facility]  [phone] [address] [place], [place]  Evaluates for Autism but does not treat it Sees Children / Accepts Medicaid  [facility]    [phone] [address], [address]   [place], [place] Medication management, substance abuse, bipolar, grief, family, marriage, OCD, anxiety, PTSD Sees children / Accepts Medicaid  [facility]    [phone] [address] [place], [place] Sees children / Accepts Medicaid  [facility]  [phone] [address] [place], [place]   Dr Akinlayo     [phone] [address] [place], [place]  Sees ADD & ADHD for treatment Accepts Medicaid  [facility]  [phone] [address] [place], [place] Evaluates for Autism Accepts Medicaid  [facility]  [phone] [address] [place], [place]  Does Adult ADD evaluations Does not accept Medicaid  [facility]   [phone] [address]   [place], [place] Uses animal therapy  Sees children as young as 3 years old Accepts Medicaid  [facility]     [phone]    [address]  [place],  [address] Sees children Accepts Medicaid  

## 2017-11-21 DIAGNOSIS — M5412 Radiculopathy, cervical region: Secondary | ICD-10-CM | POA: Diagnosis not present

## 2017-11-21 DIAGNOSIS — M9901 Segmental and somatic dysfunction of cervical region: Secondary | ICD-10-CM | POA: Diagnosis not present

## 2017-12-07 DIAGNOSIS — M9901 Segmental and somatic dysfunction of cervical region: Secondary | ICD-10-CM | POA: Diagnosis not present

## 2017-12-07 DIAGNOSIS — M5412 Radiculopathy, cervical region: Secondary | ICD-10-CM | POA: Diagnosis not present

## 2017-12-12 ENCOUNTER — Encounter: Payer: Self-pay | Admitting: Family Medicine

## 2017-12-12 ENCOUNTER — Ambulatory Visit (INDEPENDENT_AMBULATORY_CARE_PROVIDER_SITE_OTHER): Payer: Medicare Other | Admitting: Family Medicine

## 2017-12-12 VITALS — BP 128/82 | HR 78 | Ht 65.0 in | Wt 226.0 lb

## 2017-12-12 DIAGNOSIS — I10 Essential (primary) hypertension: Secondary | ICD-10-CM

## 2017-12-12 DIAGNOSIS — F324 Major depressive disorder, single episode, in partial remission: Secondary | ICD-10-CM

## 2017-12-12 MED ORDER — DULOXETINE HCL 30 MG PO CPEP: 60.0000 mg | ORAL_CAPSULE | Freq: Every day | ORAL | 1 refills | Status: DC

## 2017-12-12 MED ORDER — RANITIDINE HCL 150 MG PO TABS: 150.0000 mg | ORAL_TABLET | Freq: Two times a day (BID) | ORAL | 3 refills | Status: DC

## 2017-12-12 MED ORDER — FUROSEMIDE 20 MG PO TABS: 20.0000 mg | ORAL_TABLET | ORAL | 1 refills | Status: DC

## 2017-12-12 MED ORDER — OLMESARTAN MEDOXOMIL 20 MG PO TABS: 20.0000 mg | ORAL_TABLET | Freq: Every day | ORAL | 1 refills | Status: DC

## 2017-12-12 MED ORDER — PRAVASTATIN SODIUM 40 MG PO TABS: 40.0000 mg | ORAL_TABLET | Freq: Every morning | ORAL | 0 refills | Status: DC

## 2017-12-12 NOTE — Progress Notes (Signed)
Subjective:  Patient ID: Bethany [name], female    DOB: [birth year]  Age: 66 y.o. MRN: [id number]  CC: Hypertension (pt here today for routine follow up of her chronic medical conditions and following up on her depresionwhich is much better with the Cymbalta)   HPI [name] presents for follow-up of hypertension. Patient has no history of headache chest pain or shortness of breath or recent cough. Patient also denies symptoms of TIA such as numbness weakness lateralizing. Patient checks  blood pressure at home and has not had any elevated readings recently. Patient denies side effects from his medication. States taking it regularly. States that her depression symptoms have lifted significantly.  She no longer in fact has the symptoms adult her grand baby that is great grand baby was born and is 8 pounds.  Doing well.  The Cymbalta agrees with it without any side effects and is helping very much to lift her spirits.  She is not doing well with Christmas excited about the upcoming holiday it is not causing drowsiness or nausea.  Depression screen [facility] 2/9 12/12/2017 11/08/2017 09/06/2017  Decreased Interest 1 3 0  Down, Depressed, Hopeless 1 3 0  PHQ - 2 Score 2 6 0  Altered sleeping 1 3 -  Tired, decreased energy 1 3 -  Change in appetite 1 3 -  Feeling bad or failure about yourself  1 3 -  Trouble concentrating 1 3 -  Moving slowly or fidgety/restless 1 3 -  Suicidal thoughts 0 0 -  PHQ-9 Score 8 24 -  Difficult doing work/chores Not difficult at all - -    History Enola has a past medical history of Allergy (1997), Arthritis, GERD (gastroesophageal reflux disease), Hyperlipidemia, Hypertension, Obesity, Pneumonia, Seasonal allergies, and Sleep apnea.   She has a past surgical history that includes Abdominal hysterectomy; Tubal ligation; phlebectomy; Total knee arthroplasty (Right, 10/22/2013); Ganglion cyst excision; Breast surgery; and Total knee arthroplasty (Left, 06/11/2015).     Her family history includes Arthritis in her brother; Diabetes in her sister; Heart disease in her mother and sister.She reports that  has never smoked. she has never used smokeless tobacco. She reports that she does not drink alcohol or use drugs.    ROS Review of Systems  Constitutional: Negative for activity change, appetite change and fever.  HENT: Negative for congestion, rhinorrhea and sore throat.   Eyes: Negative for visual disturbance.  Respiratory: Negative for cough and shortness of breath.   Cardiovascular: Negative for chest pain and palpitations.  Gastrointestinal: Negative for abdominal pain, diarrhea and nausea.  Genitourinary: Negative for dysuria.  Musculoskeletal: Negative for arthralgias and myalgias.    Objective:  BP 128/82   Pulse 78   Ht 5\' 5"  (1.651 m)   Wt 226 lb (102.5 kg)   BMI 37.61 kg/m   BP Readings from Last 3 Encounters:  12/12/17 128/82  11/08/17 (!) 139/91  09/06/17 134/85    Wt Readings from Last 3 Encounters:  12/12/17 226 lb (102.5 kg)  11/08/17 226 lb 3.2 oz (102.6 kg)  09/06/17 224 lb (101.6 kg)     Physical Exam  Constitutional: She is oriented to person, place, and time. She appears well-developed and well-nourished. No distress.  HENT:  Head: Normocephalic and atraumatic.  Eyes: Conjunctivae are normal. Pupils are equal, round, and reactive to light.  Neck: Normal range of motion. Neck supple. No thyromegaly present.  Cardiovascular: Normal rate, regular rhythm and normal heart sounds.  No murmur heard. Pulmonary/Chest:  Effort normal and breath sounds normal. No respiratory distress. She has no wheezes. She has no rales.  Abdominal: Soft. Bowel sounds are normal. She exhibits no distension. There is no tenderness.  Musculoskeletal: Normal range of motion.  Lymphadenopathy:    She has no cervical adenopathy.  Neurological: She is alert and oriented to person, place, and time.  Skin: Skin is warm and dry.  Psychiatric:  She has a normal mood and affect. Her behavior is normal. Judgment and thought content normal.      Assessment & Plan:   Jillane was seen today for hypertension.  Diagnoses and all orders for this visit:  Essential hypertension  Depression, major, single episode, in partial remission ([place])  Other orders -     DULoxetine (CYMBALTA) 30 MG capsule; Take 2 capsules (60 mg total) by mouth daily with supper. For one week, then 2 capsules (60 mg) at supper time -     furosemide (LASIX) 20 MG tablet; Take 1 tablet (20 mg total) by mouth every morning. -     olmesartan (BENICAR) 20 MG tablet; Take 1 tablet (20 mg total) by mouth daily. -     pravastatin (PRAVACHOL) 40 MG tablet; Take 1 tablet (40 mg total) by mouth every morning. -     ranitidine (ZANTAC) 150 MG tablet; Take 1 tablet (150 mg total) by mouth 2 (two) times daily. For heartburn       I have changed Kaina Rogel's DULoxetine and pravastatin. I am also having her maintain her meclizine, acyclovir, nystatin, clotrimazole-betamethasone, fluticasone, furosemide, olmesartan, and ranitidine.  Allergies as of 12/12/2017      Reactions   Hctz [hydrochlorothiazide]    Hair loss      Medication List        Accurate as of 12/12/17  8:54 AM. Always use your most recent med list.          acyclovir 200 MG capsule Commonly known as:  ZOVIRAX TAKE 1 CAPSULE EVERY DAY AS NEEDED FOR  FLAREUPS   clotrimazole-betamethasone cream Commonly known as:  LOTRISONE Apply 1 application topically 2 (two) times daily.   DULoxetine 30 MG capsule Commonly known as:  CYMBALTA Take 2 capsules (60 mg total) by mouth daily with supper. For one week, then 2 capsules (60 mg) at supper time   fluticasone 50 MCG/ACT nasal spray Commonly known as:  FLONASE Place 2 sprays into both nostrils daily.   furosemide 20 MG tablet Commonly known as:  LASIX Take 1 tablet (20 mg total) by mouth every morning.   meclizine 25 MG tablet Commonly  known as:  ANTIVERT TAKE 1 TABLET THREE TIMES DAILY AS NEEDED  FOR  DIZZINESS   nystatin powder Commonly known as:  MYCOSTATIN/NYSTOP Apply topically 4 (four) times daily.   olmesartan 20 MG tablet Commonly known as:  BENICAR Take 1 tablet (20 mg total) by mouth daily.   pravastatin 40 MG tablet Commonly known as:  PRAVACHOL Take 1 tablet (40 mg total) by mouth every morning.   ranitidine 150 MG tablet Commonly known as:  ZANTAC Take 1 tablet (150 mg total) by mouth 2 (two) times daily. For heartburn        Follow-up: Return in about 4 months (around 04/12/2018).  Claretta Fraise, M.D.

## 2017-12-14 ENCOUNTER — Telehealth: Payer: Self-pay | Admitting: *Deleted

## 2017-12-14 MED ORDER — DULOXETINE HCL 30 MG PO CPEP: 60.0000 mg | ORAL_CAPSULE | Freq: Every day | ORAL | 1 refills | Status: DC

## 2017-12-14 NOTE — Telephone Encounter (Signed)
Fax received needing sig clarification Removed previous sig and resent

## 2018-01-04 ENCOUNTER — Other Ambulatory Visit: Payer: Self-pay | Admitting: Family

## 2018-01-04 DIAGNOSIS — B372 Candidiasis of skin and nail: Secondary | ICD-10-CM

## 2018-01-12 ENCOUNTER — Telehealth: Payer: Self-pay | Admitting: Family Medicine

## 2018-01-12 ENCOUNTER — Other Ambulatory Visit: Payer: Self-pay | Admitting: Family Medicine

## 2018-01-12 DIAGNOSIS — D3132 Benign neoplasm of left choroid: Secondary | ICD-10-CM | POA: Diagnosis not present

## 2018-01-12 DIAGNOSIS — M5412 Radiculopathy, cervical region: Secondary | ICD-10-CM | POA: Diagnosis not present

## 2018-01-12 DIAGNOSIS — H43813 Vitreous degeneration, bilateral: Secondary | ICD-10-CM | POA: Diagnosis not present

## 2018-01-12 DIAGNOSIS — H26491 Other secondary cataract, right eye: Secondary | ICD-10-CM | POA: Diagnosis not present

## 2018-01-12 DIAGNOSIS — M9901 Segmental and somatic dysfunction of cervical region: Secondary | ICD-10-CM | POA: Diagnosis not present

## 2018-01-12 MED ORDER — DULOXETINE HCL 60 MG PO CPEP: 60.0000 mg | ORAL_CAPSULE | Freq: Every day | ORAL | 2 refills | Status: DC

## 2018-01-12 NOTE — Telephone Encounter (Signed)
Left message advising pt Stacks out of office today but will return tomorrow and once addressed will call back.

## 2018-01-12 NOTE — Telephone Encounter (Signed)
On several occasions I had seen insurance not cover the 30 mg twice a day, but they will cover it 60 mg once a day.  Let us try that first.  If that does not work out I will send in a completely different medication.  thanks, WS

## 2018-01-16 NOTE — Telephone Encounter (Signed)
An attempt to contact pt was made without a return call in over 3 days, will close encounter.

## 2018-02-13 DIAGNOSIS — H43813 Vitreous degeneration, bilateral: Secondary | ICD-10-CM | POA: Diagnosis not present

## 2018-02-13 DIAGNOSIS — H25813 Combined forms of age-related cataract, bilateral: Secondary | ICD-10-CM | POA: Diagnosis not present

## 2018-03-03 ENCOUNTER — Encounter: Payer: Self-pay | Admitting: Family Medicine

## 2018-03-03 ENCOUNTER — Ambulatory Visit (INDEPENDENT_AMBULATORY_CARE_PROVIDER_SITE_OTHER): Payer: Medicare Other | Admitting: Family Medicine

## 2018-03-03 ENCOUNTER — Ambulatory Visit (INDEPENDENT_AMBULATORY_CARE_PROVIDER_SITE_OTHER): Payer: Medicare Other

## 2018-03-03 VITALS — BP 135/89 | HR 71 | Temp 99.1°F | Ht 65.0 in | Wt 226.0 lb

## 2018-03-03 DIAGNOSIS — R062 Wheezing: Secondary | ICD-10-CM | POA: Diagnosis not present

## 2018-03-03 DIAGNOSIS — J189 Pneumonia, unspecified organism: Secondary | ICD-10-CM

## 2018-03-03 DIAGNOSIS — R05 Cough: Secondary | ICD-10-CM | POA: Diagnosis not present

## 2018-03-03 MED ORDER — ALBUTEROL SULFATE HFA 108 (90 BASE) MCG/ACT IN AERS: 2.0000 | INHALATION_SPRAY | Freq: Four times a day (QID) | RESPIRATORY_TRACT | 0 refills | Status: DC | PRN

## 2018-03-03 MED ORDER — AMOXICILLIN-POT CLAVULANATE 875-125 MG PO TABS: 1.0000 | ORAL_TABLET | Freq: Two times a day (BID) | ORAL | 0 refills | Status: DC

## 2018-03-03 NOTE — Progress Notes (Signed)
BP 135/89   Pulse 71   Temp 99.1 F (37.3 C) (Oral)   Ht 5\' 5"  (1.651 m)   Wt 226 lb (102.5 kg)   SpO2 98%   BMI 37.61 kg/m    Subjective:    Patient ID: Bethany [name], female    DOB: 12/09/1951, 67 y.o.   MRN: [id number]  HPI: Bethany [name] is a 67 y.o. female presenting on 03/03/2018 for Cough, wheezing, SOB, right ear pain   HPI Shortness of breath and wheezing Patient is coming in complaining of shortness of breath and wheezing and cough that started about 3 weeks ago and then got better and then has worsened overnight last night.  She finally decided to come in because it got worse last night she was having wheezing and chest tightness and cough and phlegm production and she felt feverish.  She did not take her temperature but she does have a temperature of 99.1 here today in the office.  She denies any sick contacts that she knows of.  She denies any history of asthma.  She has never had to use albuterol or inhalers before.  She does say that her mom had asthma.  She just feels like things are getting worse and she is really having trouble breathing and feels tight in her chest.  Relevant past medical, surgical, family and social history reviewed and updated as indicated. Interim medical history since our last visit reviewed. Allergies and medications reviewed and updated.  Review of Systems  Constitutional: Positive for fever. Negative for chills.  HENT: Positive for congestion, postnasal drip, rhinorrhea, sinus pressure, sneezing and sore throat. Negative for ear discharge and ear pain.   Eyes: Negative for pain, redness and visual disturbance.  Respiratory: Positive for cough, chest tightness, shortness of breath and wheezing.   Cardiovascular: Negative for chest pain and leg swelling.  Genitourinary: Negative for difficulty urinating and dysuria.  Musculoskeletal: Negative for back pain, gait problem and myalgias.  Skin: Negative for rash.  Neurological: Negative for  light-headedness and headaches.  Psychiatric/Behavioral: Negative for agitation and behavioral problems.  All other systems reviewed and are negative.   Per HPI unless specifically indicated above   Allergies as of 03/03/2018      Reactions   Hctz [hydrochlorothiazide]    Hair loss      Medication List        Accurate as of 03/03/18 11:35 AM. Always use your most recent med list.          acyclovir 200 MG capsule Commonly known as:  ZOVIRAX TAKE 1 CAPSULE EVERY DAY AS NEEDED FOR  FLAREUPS   albuterol 108 (90 Base) MCG/ACT inhaler Commonly known as:  PROVENTIL HFA;VENTOLIN HFA Inhale 2 puffs into the lungs every 6 (six) hours as needed for wheezing or shortness of breath.   amoxicillin-clavulanate 875-125 MG tablet Commonly known as:  AUGMENTIN Take 1 tablet by mouth 2 (two) times daily.   clotrimazole-betamethasone cream Commonly known as:  LOTRISONE Apply 1 application topically 2 (two) times daily.   DULoxetine 60 MG capsule Commonly known as:  CYMBALTA Take 1 capsule (60 mg total) by mouth daily with supper.   fluticasone 50 MCG/ACT nasal spray Commonly known as:  FLONASE Place 2 sprays into both nostrils daily.   furosemide 20 MG tablet Commonly known as:  LASIX Take 1 tablet (20 mg total) by mouth every morning.   meclizine 25 MG tablet Commonly known as:  ANTIVERT TAKE 1 TABLET THREE TIMES DAILY AS  NEEDED  FOR  DIZZINESS   nystatin powder Generic drug:  nystatin APPLY TOPICALLY FOUR TIMES DAILY   olmesartan 20 MG tablet Commonly known as:  BENICAR Take 1 tablet (20 mg total) by mouth daily.   pravastatin 40 MG tablet Commonly known as:  PRAVACHOL Take 1 tablet (40 mg total) by mouth every morning.   ranitidine 150 MG tablet Commonly known as:  ZANTAC Take 1 tablet (150 mg total) by mouth 2 (two) times daily. For heartburn          Objective:    BP 135/89   Pulse 71   Temp 99.1 F (37.3 C) (Oral)   Ht 5\' 5"  (1.651 m)   Wt 226 lb (102.5  kg)   SpO2 98%   BMI 37.61 kg/m   Wt Readings from Last 3 Encounters:  03/03/18 226 lb (102.5 kg)  12/12/17 226 lb (102.5 kg)  11/08/17 226 lb 3.2 oz (102.6 kg)    Physical Exam  Constitutional: She is oriented to person, place, and time. She appears well-developed and well-nourished. No distress.  HENT:  Right Ear: Tympanic membrane, external ear and ear canal normal.  Left Ear: Tympanic membrane, external ear and ear canal normal.  Nose: Mucosal edema and rhinorrhea present. No epistaxis. Right sinus exhibits no maxillary sinus tenderness and no frontal sinus tenderness. Left sinus exhibits no maxillary sinus tenderness and no frontal sinus tenderness.  Mouth/Throat: Uvula is midline and mucous membranes are normal. Posterior oropharyngeal edema and posterior oropharyngeal erythema present. No oropharyngeal exudate or tonsillar abscesses.  Eyes: Conjunctivae are normal.  Neck: Neck supple. No thyromegaly present.  Cardiovascular: Normal rate, regular rhythm, normal heart sounds and intact distal pulses.  No murmur heard. Pulmonary/Chest: Effort normal. No respiratory distress. She has no decreased breath sounds. She has wheezes (Faint end expiratory wheeze). She has rhonchi. She has no rales.  Musculoskeletal: Normal range of motion. She exhibits no edema or tenderness.  Lymphadenopathy:    She has no cervical adenopathy.  Neurological: She is alert and oriented to person, place, and time. Coordination normal.  Skin: Skin is warm and dry. No rash noted. She is not diaphoretic.  Psychiatric: She has a normal mood and affect. Her behavior is normal.  Vitals reviewed.   Chest x-ray no acute consolidation or acute cardiopulmonary abnormality noted    Assessment & Plan:   Problem List Items Addressed This Visit    None    Visit Diagnoses    Atypical pneumonia    -  Primary   Relevant Medications   albuterol (PROVENTIL HFA;VENTOLIN HFA) 108 (90 Base) MCG/ACT inhaler    amoxicillin-clavulanate (AUGMENTIN) 875-125 MG tablet   Other Relevant Orders   DG Chest 2 View       Follow up plan: Return if symptoms worsen or fail to improve.  Counseling provided for all of the vaccine components Orders Placed This Encounter  Procedures  . DG Chest 2 View    Caryl Pina, MD [facility] 03/03/2018, 11:35 AM

## 2018-03-06 ENCOUNTER — Ambulatory Visit: Payer: Medicare Other | Admitting: Family Medicine

## 2018-03-13 DIAGNOSIS — M9901 Segmental and somatic dysfunction of cervical region: Secondary | ICD-10-CM | POA: Diagnosis not present

## 2018-03-13 DIAGNOSIS — M5412 Radiculopathy, cervical region: Secondary | ICD-10-CM | POA: Diagnosis not present

## 2018-03-15 DIAGNOSIS — M9901 Segmental and somatic dysfunction of cervical region: Secondary | ICD-10-CM | POA: Diagnosis not present

## 2018-03-15 DIAGNOSIS — M5412 Radiculopathy, cervical region: Secondary | ICD-10-CM | POA: Diagnosis not present

## 2018-03-16 ENCOUNTER — Telehealth: Payer: Self-pay | Admitting: Family Medicine

## 2018-03-16 NOTE — Telephone Encounter (Signed)
Forward to PCP/stacks 

## 2018-03-20 ENCOUNTER — Other Ambulatory Visit: Payer: Self-pay | Admitting: Family Medicine

## 2018-03-20 MED ORDER — AMLODIPINE BESYLATE 5 MG PO TABS: 5.0000 mg | ORAL_TABLET | Freq: Every day | ORAL | 5 refills | Status: DC

## 2018-03-20 NOTE — Telephone Encounter (Signed)
I sent in the requested prescription 

## 2018-03-20 NOTE — Telephone Encounter (Signed)
Patient notified

## 2018-03-27 ENCOUNTER — Other Ambulatory Visit: Payer: Self-pay | Admitting: Family Medicine

## 2018-03-27 DIAGNOSIS — J189 Pneumonia, unspecified organism: Secondary | ICD-10-CM

## 2018-03-31 DIAGNOSIS — H25813 Combined forms of age-related cataract, bilateral: Secondary | ICD-10-CM | POA: Diagnosis not present

## 2018-03-31 DIAGNOSIS — H04123 Dry eye syndrome of bilateral lacrimal glands: Secondary | ICD-10-CM | POA: Diagnosis not present

## 2018-03-31 DIAGNOSIS — H43813 Vitreous degeneration, bilateral: Secondary | ICD-10-CM | POA: Diagnosis not present

## 2018-03-31 DIAGNOSIS — H43393 Other vitreous opacities, bilateral: Secondary | ICD-10-CM | POA: Diagnosis not present

## 2018-04-12 ENCOUNTER — Ambulatory Visit (INDEPENDENT_AMBULATORY_CARE_PROVIDER_SITE_OTHER): Payer: Medicare Other | Admitting: Family Medicine

## 2018-04-12 ENCOUNTER — Encounter: Payer: Self-pay | Admitting: Family Medicine

## 2018-04-12 ENCOUNTER — Encounter (INDEPENDENT_AMBULATORY_CARE_PROVIDER_SITE_OTHER): Payer: Self-pay | Admitting: *Deleted

## 2018-04-12 VITALS — BP 126/86 | HR 72 | Temp 98.7°F | Ht 65.0 in | Wt 234.5 lb

## 2018-04-12 DIAGNOSIS — M179 Osteoarthritis of knee, unspecified: Secondary | ICD-10-CM

## 2018-04-12 DIAGNOSIS — J301 Allergic rhinitis due to pollen: Secondary | ICD-10-CM | POA: Diagnosis not present

## 2018-04-12 DIAGNOSIS — M1710 Unilateral primary osteoarthritis, unspecified knee: Secondary | ICD-10-CM

## 2018-04-12 DIAGNOSIS — I10 Essential (primary) hypertension: Secondary | ICD-10-CM | POA: Diagnosis not present

## 2018-04-12 DIAGNOSIS — E782 Mixed hyperlipidemia: Secondary | ICD-10-CM

## 2018-04-12 DIAGNOSIS — Z1211 Encounter for screening for malignant neoplasm of colon: Secondary | ICD-10-CM

## 2018-04-12 DIAGNOSIS — F324 Major depressive disorder, single episode, in partial remission: Secondary | ICD-10-CM

## 2018-04-12 DIAGNOSIS — E559 Vitamin D deficiency, unspecified: Secondary | ICD-10-CM

## 2018-04-12 DIAGNOSIS — R7309 Other abnormal glucose: Secondary | ICD-10-CM | POA: Diagnosis not present

## 2018-04-12 LAB — URINALYSIS
Bilirubin, UA: NEGATIVE
GLUCOSE, UA: NEGATIVE
Ketones, UA: NEGATIVE
Leukocytes, UA: NEGATIVE
Nitrite, UA: NEGATIVE
RBC, UA: NEGATIVE
Specific Gravity, UA: 1.015 (ref 1.005–1.030)
UUROB: 0.2 mg/dL (ref 0.2–1.0)
pH, UA: 7 (ref 5.0–7.5)

## 2018-04-12 MED ORDER — FUROSEMIDE 20 MG PO TABS: 20.0000 mg | ORAL_TABLET | ORAL | 1 refills | Status: DC

## 2018-04-12 MED ORDER — PRAVASTATIN SODIUM 40 MG PO TABS: 40.0000 mg | ORAL_TABLET | Freq: Every morning | ORAL | 1 refills | Status: DC

## 2018-04-12 MED ORDER — FLUTICASONE PROPIONATE 50 MCG/ACT NA SUSP: 2.0000 | NASAL | 11 refills | Status: DC | PRN

## 2018-04-12 NOTE — Patient Instructions (Signed)

## 2018-04-12 NOTE — Progress Notes (Signed)
Subjective:  Patient ID: Bethany [name], female    DOB: 04/05/1951  Age: 67 y.o. MRN: [id number]  CC: Follow-up (pt here today for follow up of her chronic medical conditions and pt has also stopped cymbalta as discussed  and hasn't had any problems)   HPI Aubrie Lucien presents for  follow-up of hypertension. Patient has no history of headache chest pain or shortness of breath or recent cough. Patient also denies symptoms of TIA such as focal numbness or weakness. Patient denies side effects from medication. States taking it regularly.  Patient in for follow-up of elevated cholesterol. Doing well without complaints on current medication. Denies side effects of statin including myalgia and arthralgia and nausea. Also in today for liver function testing. Currently no chest pain, shortness of breath or other cardiovascular related symptoms noted.  Patient states that arthritis has not been bothersome recently.  She is managing quite well.  Additionally she stopped the Cymbalta as she is thriving.  Has no complaints.  Does not feel lo or sad or depressed.  She is concentrating and thinking well without feeling her mind being clouded.   History Mariane has a past medical history of Allergy (1997), Arthritis, GERD (gastroesophageal reflux disease), Hyperlipidemia, Hypertension, Obesity, Pneumonia, Seasonal allergies, and Sleep apnea.   She has a past surgical history that includes Abdominal hysterectomy; Tubal ligation; phlebectomy; Total knee arthroplasty (Right, 10/22/2013); Ganglion cyst excision; Breast surgery; and Total knee arthroplasty (Left, 06/11/2015).   Her family history includes Arthritis in her brother; Diabetes in her sister; Heart disease in her mother and sister.She reports that she has never smoked. She has never used smokeless tobacco. She reports that she does not drink alcohol or use drugs.  Current Outpatient Medications on File Prior to Visit  Medication Sig Dispense Refill   . acyclovir (ZOVIRAX) 200 MG capsule TAKE 1 CAPSULE EVERY DAY AS NEEDED FOR  FLAREUPS 90 capsule 3  . albuterol (PROVENTIL HFA;VENTOLIN HFA) 108 (90 Base) MCG/ACT inhaler Inhale 2 puffs into the lungs every 6 (six) hours as needed for wheezing or shortness of breath. 1 Inhaler 0  . amLODipine (NORVASC) 5 MG tablet Take 1 tablet (5 mg total) by mouth daily. For blood pressure 30 tablet 5  . clotrimazole-betamethasone (LOTRISONE) cream Apply 1 application topically 2 (two) times daily. (Patient taking differently: Apply 1 application topically as needed. ) 60 g 0  . meclizine (ANTIVERT) 25 MG tablet TAKE 1 TABLET THREE TIMES DAILY AS NEEDED  FOR  DIZZINESS 270 tablet 0  . NYSTATIN powder APPLY TOPICALLY FOUR TIMES DAILY 15 g 2  . ranitidine (ZANTAC) 150 MG tablet Take 1 tablet (150 mg total) by mouth 2 (two) times daily. For heartburn 90 tablet 3   No current facility-administered medications on file prior to visit.     ROS Review of Systems  Constitutional: Negative.   HENT: Negative for congestion.   Eyes: Negative for visual disturbance.  Respiratory: Negative for shortness of breath.   Cardiovascular: Negative for chest pain.  Gastrointestinal: Negative for abdominal pain, constipation, diarrhea, nausea and vomiting.  Genitourinary: Negative for difficulty urinating.  Musculoskeletal: Negative for arthralgias and myalgias.  Neurological: Negative for headaches.  Psychiatric/Behavioral: Negative for sleep disturbance.    Objective:  BP 126/86   Pulse 72   Temp 98.7 F (37.1 C) (Oral)   Ht _0  (1.651 m)   Wt 234 lb 8 oz (106.4 kg)   BMI 39.02 kg/m   BP Readings from Last 3 Encounters:  04/12/18 126/86  03/03/18 135/89  12/12/17 128/82    Wt Readings from Last 3 Encounters:  04/12/18 234 lb 8 oz (106.4 kg)  03/03/18 226 lb (102.5 kg)  12/12/17 226 lb (102.5 kg)     Physical Exam  Constitutional: She is oriented to person, place, and time. No distress.  HENT:    Head: Normocephalic and atraumatic.  Right Ear: External ear normal.  Left Ear: External ear normal.  Nose: Nose normal.  Mouth/Throat: Oropharynx is clear and moist.  Eyes: Pupils are equal, round, and reactive to light. Conjunctivae and EOM are normal. No scleral icterus.  Neck: Normal range of motion. Neck supple. No JVD present. No tracheal deviation present. No thyromegaly present.  Cardiovascular: Normal rate, regular rhythm and normal heart sounds. Exam reveals no gallop and no friction rub.  No murmur heard. Pulmonary/Chest: Effort normal and breath sounds normal. No respiratory distress. She has no wheezes. She has no rales. She exhibits no tenderness.  Abdominal: Soft. Bowel sounds are normal. She exhibits no distension and no mass. There is no tenderness. There is no rebound and no guarding.  Musculoskeletal: Normal range of motion. She exhibits no edema or tenderness.  Lymphadenopathy:    She has no cervical adenopathy.  Neurological: She is alert and oriented to person, place, and time. She displays normal reflexes. No cranial nerve deficit. She exhibits normal muscle tone. Coordination normal.  Skin: Skin is warm and dry. No rash noted. She is not diaphoretic. No erythema.  Psychiatric: She has a normal mood and affect. Her behavior is normal. Judgment and thought content normal.  Vitals reviewed.     Assessment & Plan:   Brenley was seen today for follow-up.  Diagnoses and all orders for this visit:  Essential hypertension  Mixed hyperlipidemia -     CBC with Differential/Platelet -     CMP14+EGFR -     Lipid panel -     Urinalysis  Osteoarthritis of knee, unspecified laterality, unspecified osteoarthritis type  Depression, major, single episode, in partial remission ([place])  Screening for colon cancer -     Ambulatory referral to Gastroenterology  Vitamin D deficiency -     VITAMIN D 25 Hydroxy (Vit-D Deficiency, Fractures)  Allergic rhinitis due to  pollen, unspecified seasonality -     fluticasone (FLONASE) 50 MCG/ACT nasal spray; Place 2 sprays into both nostrils as needed.  Other orders -     furosemide (LASIX) 20 MG tablet; Take 1 tablet (20 mg total) by mouth every morning. -     pravastatin (PRAVACHOL) 40 MG tablet; Take 1 tablet (40 mg total) by mouth every morning.   Allergies as of 04/12/2018      Reactions   Hctz [hydrochlorothiazide]    Hair loss      Medication List        Accurate as of 04/12/18  9:05 AM. Always use your most recent med list.          acyclovir 200 MG capsule Commonly known as:  ZOVIRAX TAKE 1 CAPSULE EVERY DAY AS NEEDED FOR  FLAREUPS   albuterol 108 (90 Base) MCG/ACT inhaler Commonly known as:  PROVENTIL HFA;VENTOLIN HFA Inhale 2 puffs into the lungs every 6 (six) hours as needed for wheezing or shortness of breath.   amLODipine 5 MG tablet Commonly known as:  NORVASC Take 1 tablet (5 mg total) by mouth daily. For blood pressure   clotrimazole-betamethasone cream Commonly known as:  LOTRISONE Apply 1 application topically 2 (  two) times daily.   fluticasone 50 MCG/ACT nasal spray Commonly known as:  FLONASE Place 2 sprays into both nostrils as needed.   furosemide 20 MG tablet Commonly known as:  LASIX Take 1 tablet (20 mg total) by mouth every morning.   meclizine 25 MG tablet Commonly known as:  ANTIVERT TAKE 1 TABLET THREE TIMES DAILY AS NEEDED  FOR  DIZZINESS   nystatin powder Generic drug:  nystatin APPLY TOPICALLY FOUR TIMES DAILY   pravastatin 40 MG tablet Commonly known as:  PRAVACHOL Take 1 tablet (40 mg total) by mouth every morning.   ranitidine 150 MG tablet Commonly known as:  ZANTAC Take 1 tablet (150 mg total) by mouth 2 (two) times daily. For heartburn       Meds ordered this encounter  Medications  . fluticasone (FLONASE) 50 MCG/ACT nasal spray    Sig: Place 2 sprays into both nostrils as needed.    Dispense:  16 g    Refill:  11  . furosemide  (LASIX) 20 MG tablet    Sig: Take 1 tablet (20 mg total) by mouth every morning.    Dispense:  90 tablet    Refill:  1  . pravastatin (PRAVACHOL) 40 MG tablet    Sig: Take 1 tablet (40 mg total) by mouth every morning.    Dispense:  90 tablet    Refill:  1      Follow-up: Return in about 3 months (around 07/12/2018).  Claretta Fraise, M.D.

## 2018-04-13 LAB — LIPID PANEL
CHOL/HDL RATIO: 3.2 ratio (ref 0.0–4.4)
Cholesterol, Total: 152 mg/dL (ref 100–199)
HDL: 47 mg/dL (ref >39)
LDL Calculated: 85 mg/dL (ref 0–99)
Triglycerides: 98 mg/dL (ref 0–149)
VLDL CHOLESTEROL CAL: 20 mg/dL (ref 5–40)

## 2018-04-13 LAB — CBC WITH DIFFERENTIAL/PLATELET
BASOS: 1 %
Basophils Absolute: 0 10*3/uL (ref 0.0–0.2)
EOS (ABSOLUTE): 0.2 10*3/uL (ref 0.0–0.4)
EOS: 3 %
HEMOGLOBIN: 13 g/dL (ref 11.1–15.9)
Hematocrit: 39.5 % (ref 34.0–46.6)
IMMATURE GRANS (ABS): 0 10*3/uL (ref 0.0–0.1)
IMMATURE GRANULOCYTES: 0 %
Lymphocytes Absolute: 2 10*3/uL (ref 0.7–3.1)
Lymphs: 25 %
MCH: 28.4 pg (ref 26.6–33.0)
MCHC: 32.9 g/dL (ref 31.5–35.7)
MCV: 86 fL (ref 79–97)
MONOCYTES: 7 %
Monocytes Absolute: 0.6 10*3/uL (ref 0.1–0.9)
NEUTROS ABS: 5.3 10*3/uL (ref 1.4–7.0)
NEUTROS PCT: 64 %
Platelets: 262 10*3/uL (ref 150–379)
RBC: 4.57 x10E6/uL (ref 3.77–5.28)
RDW: 15.6 % — ABNORMAL HIGH (ref 12.3–15.4)
WBC: 8.2 10*3/uL (ref 3.4–10.8)

## 2018-04-13 LAB — CMP14+EGFR
A/G RATIO: 1.2 (ref 1.2–2.2)
ALBUMIN: 4.1 g/dL (ref 3.6–4.8)
ALT: 16 IU/L (ref 0–32)
AST: 23 IU/L (ref 0–40)
Alkaline Phosphatase: 101 IU/L (ref 39–117)
BUN / CREAT RATIO: 11 — AB (ref 12–28)
BUN: 8 mg/dL (ref 8–27)
Bilirubin Total: 0.7 mg/dL (ref 0.0–1.2)
CALCIUM: 9.8 mg/dL (ref 8.7–10.3)
CO2: 25 mmol/L (ref 20–29)
Chloride: 103 mmol/L (ref 96–106)
Creatinine, Ser: 0.73 mg/dL (ref 0.57–1.00)
GFR, EST AFRICAN AMERICAN: 98 mL/min/{1.73_m2} (ref >59)
GFR, EST NON AFRICAN AMERICAN: 85 mL/min/{1.73_m2} (ref >59)
Globulin, Total: 3.3 g/dL (ref 1.5–4.5)
Glucose: 133 mg/dL — ABNORMAL HIGH (ref 65–99)
POTASSIUM: 3.9 mmol/L (ref 3.5–5.2)
Sodium: 142 mmol/L (ref 134–144)
TOTAL PROTEIN: 7.4 g/dL (ref 6.0–8.5)

## 2018-04-13 LAB — VITAMIN D 25 HYDROXY (VIT D DEFICIENCY, FRACTURES): Vit D, 25-Hydroxy: 34.2 ng/mL (ref 30.0–100.0)

## 2018-04-15 LAB — HGB A1C W/O EAG: Hgb A1c MFr Bld: 6.2 % — ABNORMAL HIGH (ref 4.8–5.6)

## 2018-04-15 LAB — SPECIMEN STATUS REPORT

## 2018-04-16 ENCOUNTER — Other Ambulatory Visit: Payer: Self-pay | Admitting: Family Medicine

## 2018-04-16 MED ORDER — METFORMIN HCL ER 750 MG PO TB24: 750.0000 mg | ORAL_TABLET | Freq: Every day | ORAL | 5 refills | Status: DC

## 2018-04-16 MED ORDER — METFORMIN HCL ER 750 MG PO TB24: 1500.0000 mg | ORAL_TABLET | Freq: Every day | ORAL | 5 refills | Status: DC

## 2018-04-24 ENCOUNTER — Telehealth: Payer: Self-pay | Admitting: Family Medicine

## 2018-04-24 NOTE — Telephone Encounter (Signed)
Pt aware of test results

## 2018-05-08 DIAGNOSIS — M9901 Segmental and somatic dysfunction of cervical region: Secondary | ICD-10-CM | POA: Diagnosis not present

## 2018-05-08 DIAGNOSIS — M5412 Radiculopathy, cervical region: Secondary | ICD-10-CM | POA: Diagnosis not present

## 2018-05-11 DIAGNOSIS — M9901 Segmental and somatic dysfunction of cervical region: Secondary | ICD-10-CM | POA: Diagnosis not present

## 2018-05-11 DIAGNOSIS — M5412 Radiculopathy, cervical region: Secondary | ICD-10-CM | POA: Diagnosis not present

## 2018-05-15 DIAGNOSIS — M5412 Radiculopathy, cervical region: Secondary | ICD-10-CM | POA: Diagnosis not present

## 2018-05-15 DIAGNOSIS — M9901 Segmental and somatic dysfunction of cervical region: Secondary | ICD-10-CM | POA: Diagnosis not present

## 2018-06-02 ENCOUNTER — Encounter: Payer: Self-pay | Admitting: *Deleted

## 2018-06-02 DIAGNOSIS — Z96653 Presence of artificial knee joint, bilateral: Secondary | ICD-10-CM | POA: Diagnosis not present

## 2018-06-02 DIAGNOSIS — Z471 Aftercare following joint replacement surgery: Secondary | ICD-10-CM | POA: Diagnosis not present

## 2018-06-02 DIAGNOSIS — M170 Bilateral primary osteoarthritis of knee: Secondary | ICD-10-CM | POA: Diagnosis not present

## 2018-06-08 ENCOUNTER — Other Ambulatory Visit: Payer: Self-pay

## 2018-06-08 ENCOUNTER — Telehealth: Payer: Self-pay | Admitting: Family Medicine

## 2018-06-08 ENCOUNTER — Encounter: Payer: Self-pay | Admitting: Physical Therapy

## 2018-06-08 ENCOUNTER — Ambulatory Visit: Payer: Medicare Other | Attending: Physician Assistant | Admitting: Physical Therapy

## 2018-06-08 DIAGNOSIS — G8929 Other chronic pain: Secondary | ICD-10-CM | POA: Diagnosis not present

## 2018-06-08 DIAGNOSIS — M25562 Pain in left knee: Secondary | ICD-10-CM | POA: Diagnosis not present

## 2018-06-08 DIAGNOSIS — M25662 Stiffness of left knee, not elsewhere classified: Secondary | ICD-10-CM | POA: Insufficient documentation

## 2018-06-08 DIAGNOSIS — M179 Osteoarthritis of knee, unspecified: Secondary | ICD-10-CM

## 2018-06-08 DIAGNOSIS — M1710 Unilateral primary osteoarthritis, unspecified knee: Secondary | ICD-10-CM

## 2018-06-08 DIAGNOSIS — Z780 Asymptomatic menopausal state: Secondary | ICD-10-CM

## 2018-06-08 NOTE — Therapy (Signed)
[facility] [place], [place], [address] Phone: [phone]   Fax:  [phone]  Physical Therapy Evaluation  Patient Details  Name: Bethany [name] MRN: [id number] Date of Birth: 01/11/1951 Referring Provider: Gerrit Halls PA-C.   Encounter Date: 06/08/2018  PT End of Session - 06/08/18 1316    Visit Number  1    Number of Visits  12    Date for PT Re-Evaluation  07/06/18    Authorization Type  FOTO AT LEAST EVERY 5TH VISIT, 10TH VISIT PROGRESS NOTE AND KX MODIFIER AFTER THE 15 VISIT.    PT Start Time  0945    PT Stop Time  1031    PT Time Calculation (min)  46 min    Activity Tolerance  Patient tolerated treatment well    Behavior During Therapy  WFL for tasks assessed/performed       Past Medical History:  Diagnosis Date  . Allergy 1997   knees  . Arthritis   . GERD (gastroesophageal reflux disease)   . Hyperlipidemia   . Hypertension   . Obesity   . Pneumonia    1 month ago - rsolved  . Seasonal allergies   . Sleep apnea    cpap    Past Surgical History:  Procedure Laterality Date  . ABDOMINAL HYSTERECTOMY    . BREAST SURGERY     fibroid cysts removed  . GANGLION CYST EXCISION     left  . phlebectomy    . TOTAL KNEE ARTHROPLASTY Right 10/22/2013   Procedure: RIGHT TOTAL KNEE ARTHROPLASTY;  Surgeon: Gearlean Alf, MD;  Location: [facility];  Service: Orthopedics;  Laterality: Right;  . TOTAL KNEE ARTHROPLASTY Left 06/11/2015   Procedure: LEFT TOTAL KNEE ARTHROPLASTY;  Surgeon: Gaynelle Arabian, MD;  Location: [facility];  Service: Orthopedics;  Laterality: Left;  . TUBAL LIGATION      There were no vitals filed for this visit.   Subjective Assessment - 06/08/18 1319    Subjective  The patient underwent a left total knee replacement about 3 years ago.  She reports some severe flare-ups.  Ice decreases her pain and getting up and down increases her pain and at times her knee buckles.  Today at rest her pain  is rated at 4/10 but during the aforementioned activites her pain rises to very high levels.    Pertinent History  Bilateral TKA's.    Patient Stated Goals  Get out of pain.    Currently in Pain?  Yes    Pain Score  4     Pain Location  Knee    Pain Orientation  Left    Pain Descriptors / Indicators  Aching;Throbbing    Pain Type  Chronic pain    Pain Onset  More than a month ago    Pain Frequency  Constant    Aggravating Factors   See above.    Pain Relieving Factors  See above.         [facility] PT Assessment - 06/08/18 0001      Assessment   Medical Diagnosis  Left knee pain.    Referring Provider  Gerrit Halls PA-C.    Onset Date/Surgical Date  -- 05/13/18(most recent flare-up).      Precautions   Precautions  -- No ultrasound.      Restrictions   Weight Bearing Restrictions  No      Balance Screen   Has the patient fallen in the past 6 months  No  Has the patient had a decrease in activity level because of a fear of falling?   Yes    Is the patient reluctant to leave their home because of a fear of falling?   No      Home Environment   Living Environment  Private residence      Prior Function   Level of Independence  Independent      Observation/Other Assessments   Focus on Therapeutic Outcomes (FOTO)   74% limitation.      Posture/Postural Control   Posture/Postural Control  No significant limitations      ROM / Strength   AROM / PROM / Strength  AROM;Strength      AROM   Overall AROM Comments  Left knee -5 to 120 degrees activiely.      Strength   Overall Strength Comments  Normal left LE strength.      Palpation   Palpation comment  Tender to palpation along distal 2/3's of left ITB but especially tender at left knee lateral joint line and superior to distal ITB in bursal region.  Patient also c/o pain as "a band" in left anterior knee region.      Special Tests   Other special tests  Localized pocket of swelling in region of left lateral joint line  region of patient's left knee.      Ambulation/Gait   Gait Comments  Gait antalgia noted with minimal decrease stance time over left LE.                Objective measurements completed on examination: See above findings.      [facility] Adult PT Treatment/Exercise - 06/08/18 0001      Modalities   Modalities  Electrical Stimulation;Iontophoresis;Vasopneumatic      Acupuncturist Location  Left knee.    Electrical Stimulation Action  IFC x 10 minutes.    Electrical Stimulation Goals  Pain      Iontophoresis   Type of Iontophoresis  Dexamethasone    Location  Left distal ITB region.    Dose  80 mA-Min    Time  -- 8.               PT Short Term Goals - 06/08/18 1341      PT SHORT TERM GOAL #1   Title  STG's=LTG's.        PT Long Term Goals - 06/08/18 1341      PT LONG TERM GOAL #1   Title  Independent with an HEP.    Time  4    Period  Weeks    Status  New      PT LONG TERM GOAL #2   Title  Sit to stand x 5 with left knee pain not > 2-3/10.    Time  4    Period  Weeks    Status  New      PT LONG TERM GOAL #3   Title  Perform ADL's with pain not > 2-3/10.    Time  4    Period  Weeks    Status  New             Plan - 06/08/18 1337    Clinical Impression Statement  The patient presents to OPPT with c/o left knee pain.  She was very tender to palpation at her left lateral knee joint line at the ITB and just superior to this region in the bursal region.  She also c/o pain along 2/3 of her distal left ITB.  She reports flare-ups can be severe and limitations her functional mobility a great deal.  Patient will benefit from skilled physical therapy intervention to address pain and deficits.    History and Personal Factors relevant to plan of care:  Left total knee replacement.    Clinical Presentation  Evolving    Clinical Presentation due to:  Nor improving.    Clinical Decision Making  Low    Rehab Potential   Excellent    PT Frequency  3x / week    PT Duration  4 weeks    PT Treatment/Interventions  ADLs/Self Care Home Management;Cryotherapy;Electrical Stimulation;Iontophoresis 4mg /ml Dexamethasone;Moist Heat;Therapeutic exercise;Therapeutic activities;Patient/family education;Passive range of motion;Manual techniques    PT Next Visit Plan  ISTAM to affected left ITB especially distal; e'stim; vasopnuematic; Iontophoresis; stationary bike.    Consulted and Agree with Plan of Care  Patient       Patient will benefit from skilled therapeutic intervention in order to improve the following deficits and impairments:  Abnormal gait, Decreased activity tolerance, Decreased range of motion, Pain  Visit Diagnosis: Chronic pain of left knee  Stiffness of left knee, not elsewhere classified     Problem List Patient Active Problem List   Diagnosis Date Noted  . OSA (obstructive sleep apnea) 03/25/2014  . OA (osteoarthritis) of knee 10/22/2013  . Obesity, unspecified 07/02/2013  . Arthritis 07/02/2013  . Mouth dryness 07/02/2013  . Benign paroxysmal positional vertigo 07/02/2013  . Unspecified vitamin D deficiency 07/02/2013  . Hypertension 05/04/2013  . Hyperlipidemia 05/04/2013    Tonianne Fine, [place] MPT 06/08/2018, 1:43 PM  [facility] [address] [place], [place], [address] Phone: [phone]   Fax:  [phone]  Name: Bethany [name] MRN: [id number] Date of Birth: 1951/08/01

## 2018-06-09 NOTE — Telephone Encounter (Signed)
Dexa ordered.  Lmtcb

## 2018-06-09 NOTE — Telephone Encounter (Signed)
Don't see this on problem list but also don't see that dexa was addressed. Last with report was 2014. Please review and advise

## 2018-06-09 NOTE — Telephone Encounter (Signed)
Please order DEXA

## 2018-06-12 ENCOUNTER — Encounter: Payer: Self-pay | Admitting: Physical Therapy

## 2018-06-12 ENCOUNTER — Ambulatory Visit: Payer: Medicare Other | Admitting: Physical Therapy

## 2018-06-12 DIAGNOSIS — M25662 Stiffness of left knee, not elsewhere classified: Secondary | ICD-10-CM | POA: Diagnosis not present

## 2018-06-12 DIAGNOSIS — G8929 Other chronic pain: Secondary | ICD-10-CM | POA: Diagnosis not present

## 2018-06-12 DIAGNOSIS — M25562 Pain in left knee: Principal | ICD-10-CM

## 2018-06-12 NOTE — Therapy (Signed)
[facility] [place], [place], [address] Phone: [phone]   Fax:  [phone]  Physical Therapy Treatment  Patient Details  Name: Bethany [name] MRN: [id number] Date of Birth: 1950/12/29 Referring Provider: Gerrit Halls PA-C.   Encounter Date: 06/12/2018  PT End of Session - 06/12/18 0811    Visit Number  2    Number of Visits  12    Date for PT Re-Evaluation  07/06/18    Authorization Type  FOTO AT LEAST EVERY 5TH VISIT, 10TH VISIT PROGRESS NOTE AND KX MODIFIER AFTER THE 15 VISIT.    PT Start Time  0730    PT Stop Time  0821    PT Time Calculation (min)  51 min    Activity Tolerance  Patient tolerated treatment well    Behavior During Therapy  [facility] for tasks assessed/performed       Past Medical History:  Diagnosis Date  . Allergy 1997   knees  . Arthritis   . GERD (gastroesophageal reflux disease)   . Hyperlipidemia   . Hypertension   . Obesity   . Pneumonia    1 month ago - rsolved  . Seasonal allergies   . Sleep apnea    cpap    Past Surgical History:  Procedure Laterality Date  . ABDOMINAL HYSTERECTOMY    . BREAST SURGERY     fibroid cysts removed  . GANGLION CYST EXCISION     left  . phlebectomy    . TOTAL KNEE ARTHROPLASTY Right 10/22/2013   Procedure: RIGHT TOTAL KNEE ARTHROPLASTY;  Surgeon: Gearlean Alf, MD;  Location: [facility];  Service: Orthopedics;  Laterality: Right;  . TOTAL KNEE ARTHROPLASTY Left 06/11/2015   Procedure: LEFT TOTAL KNEE ARTHROPLASTY;  Surgeon: Gaynelle Arabian, MD;  Location: [facility];  Service: Orthopedics;  Laterality: Left;  . TUBAL LIGATION      There were no vitals filed for this visit.  Subjective Assessment - 06/12/18 0741    Subjective  Patient arrived with minimal discomfort and did well after last treatment    Pertinent History  Bilateral TKA's.    Patient Stated Goals  Get out of pain.    Currently in Pain?  Yes    Pain Score  2     Pain Location  Knee    Pain Orientation  Left    Pain Descriptors / Indicators  Aching;Discomfort    Pain Type  Chronic pain    Pain Onset  More than a month ago    Pain Frequency  Constant    Aggravating Factors   bening it with weight, or lifting     Pain Relieving Factors  at rest                       [facility] Adult PT Treatment/Exercise - 06/12/18 0001      Exercises   Exercises  Knee/Hip      Knee/Hip Exercises: Aerobic   Nustep  10 min L3 UE/LE      Electrical Stimulation   Electrical Stimulation Location  Left knee.    Electrical Stimulation Action  IFC    Electrical Stimulation Parameters  1-10hz  x 75min    Electrical Stimulation Goals  Pain      Iontophoresis   Type of Iontophoresis  Dexamethasone    Location  Left distal ITB region.    Dose  80 mA-Min 2 of 6    Time  8  Vasopneumatic   Number Minutes Vasopneumatic   15 minutes    Vasopnuematic Location   Knee    Vasopneumatic Pressure  Low      Manual Therapy   Manual Therapy  Myofascial release;Soft tissue mobilization    Manual therapy comments  gentle manual STW to left ITB esp distal to reduce pain and tone               PT Short Term Goals - 06/08/18 1341      PT SHORT TERM GOAL #1   Title  STG's=LTG's.        PT Long Term Goals - 06/08/18 1341      PT LONG TERM GOAL #1   Title  Independent with an HEP.    Time  4    Period  Weeks    Status  New      PT LONG TERM GOAL #2   Title  Sit to stand x 5 with left knee pain not > 2-3/10.    Time  4    Period  Weeks    Status  New      PT LONG TERM GOAL #3   Title  Perform ADL's with pain not > 2-3/10.    Time  4    Period  Weeks    Status  New            Plan - 06/12/18 0817    Clinical Impression Statement  Patient tolerated treatment well today. Patient has palpable pain in distal ITB in left LE and pain with any lifting of the leg or using any weights to lift leg. Today focused on gentle strengthening/ROM and manual  STW/modalities to help decrease discomfort. Goals progressing.     Rehab Potential  Excellent    PT Frequency  3x / week    PT Duration  4 weeks    PT Treatment/Interventions  ADLs/Self Care Home Management;Cryotherapy;Electrical Stimulation;Iontophoresis 4mg /ml Dexamethasone;Moist Heat;Therapeutic exercise;Therapeutic activities;Patient/family education;Passive range of motion;Manual techniques    PT Next Visit Plan  ISTAM to affected left ITB especially distal; e'stim; vasopnuematic; Iontophoresis; stationary bike.    Consulted and Agree with Plan of Care  Patient       Patient will benefit from skilled therapeutic intervention in order to improve the following deficits and impairments:  Abnormal gait, Decreased activity tolerance, Decreased range of motion, Pain  Visit Diagnosis: Chronic pain of left knee  Stiffness of left knee, not elsewhere classified     Problem List Patient Active Problem List   Diagnosis Date Noted  . OSA (obstructive sleep apnea) 03/25/2014  . OA (osteoarthritis) of knee 10/22/2013  . Obesity, unspecified 07/02/2013  . Arthritis 07/02/2013  . Mouth dryness 07/02/2013  . Benign paroxysmal positional vertigo 07/02/2013  . Unspecified vitamin D deficiency 07/02/2013  . Hypertension 05/04/2013  . Hyperlipidemia 05/04/2013    Phillips Climes, PTA 06/12/2018, 8:23 AM  [facility] [address] [place], [place], [address] Phone: [phone]   Fax:  [phone]  Name: Bethany [name] MRN: [id number] Date of Birth: 08-16-1951

## 2018-06-14 ENCOUNTER — Encounter: Payer: Self-pay | Admitting: Physical Therapy

## 2018-06-14 ENCOUNTER — Ambulatory Visit: Payer: Medicare Other | Admitting: Physical Therapy

## 2018-06-14 DIAGNOSIS — G8929 Other chronic pain: Secondary | ICD-10-CM | POA: Diagnosis not present

## 2018-06-14 DIAGNOSIS — M25562 Pain in left knee: Secondary | ICD-10-CM | POA: Diagnosis not present

## 2018-06-14 DIAGNOSIS — M25662 Stiffness of left knee, not elsewhere classified: Secondary | ICD-10-CM | POA: Diagnosis not present

## 2018-06-14 NOTE — Therapy (Signed)
[facility] [place], [place], [address] Phone: [phone]   Fax:  [phone]  Physical Therapy Treatment  Patient Details  Name: Bethany [name] MRN: [id number] Date of Birth: July 07, 1951 Referring Provider: Gerrit Halls PA-C.   Encounter Date: 06/14/2018  PT End of Session - 06/14/18 0811    Visit Number  3    Number of Visits  12    Date for PT Re-Evaluation  07/06/18    Authorization Type  FOTO AT LEAST EVERY 5TH VISIT, 10TH VISIT PROGRESS NOTE AND KX MODIFIER AFTER THE 15 VISIT.    PT Start Time  [phone]    PT Stop Time  0825    PT Time Calculation (min)  54 min    Activity Tolerance  Patient tolerated treatment well    Behavior During Therapy  [facility] for tasks assessed/performed       Past Medical History:  Diagnosis Date  . Allergy 1997   knees  . Arthritis   . GERD (gastroesophageal reflux disease)   . Hyperlipidemia   . Hypertension   . Obesity   . Pneumonia    1 month ago - rsolved  . Seasonal allergies   . Sleep apnea    cpap    Past Surgical History:  Procedure Laterality Date  . ABDOMINAL HYSTERECTOMY    . BREAST SURGERY     fibroid cysts removed  . GANGLION CYST EXCISION     left  . phlebectomy    . TOTAL KNEE ARTHROPLASTY Right 10/22/2013   Procedure: RIGHT TOTAL KNEE ARTHROPLASTY;  Surgeon: Gearlean Alf, MD;  Location: [facility];  Service: Orthopedics;  Laterality: Right;  . TOTAL KNEE ARTHROPLASTY Left 06/11/2015   Procedure: LEFT TOTAL KNEE ARTHROPLASTY;  Surgeon: Gaynelle Arabian, MD;  Location: [facility];  Service: Orthopedics;  Laterality: Left;  . TUBAL LIGATION      There were no vitals filed for this visit.  Subjective Assessment - 06/14/18 0736    Subjective  Patient arrived with minimal discomfort and did well after last treatment    Pertinent History  Bilateral TKA's.    Patient Stated Goals  Get out of pain.    Currently in Pain?  Yes    Pain Score  2     Pain Location  Knee    Pain Orientation  Left    Pain Descriptors / Indicators  Aching;Discomfort    Pain Type  Chronic pain    Pain Onset  More than a month ago    Pain Frequency  Constant    Aggravating Factors   bending it with weight or lifting LE    Pain Relieving Factors  at rest                       [facility] Adult PT Treatment/Exercise - 06/14/18 0001      Knee/Hip Exercises: Aerobic   Nustep  13 min L4 UE/LE      Knee/Hip Exercises: Supine   Other Supine Knee/Hip Exercises  supine hip abd with red t-band x30      Electrical Stimulation   Electrical Stimulation Location  Left knee.    Electrical Stimulation Action  IFC    Electrical Stimulation Parameters  1-10hz  x55min    Electrical Stimulation Goals  Pain      Iontophoresis   Type of Iontophoresis  Dexamethasone    Location  Left distal ITB region.    Dose  80 mA-Min 3 of 6  Time  8      Vasopneumatic   Number Minutes Vasopneumatic   15 minutes    Vasopnuematic Location   Knee    Vasopneumatic Pressure  Low      Manual Therapy   Manual Therapy  Myofascial release;Soft tissue mobilization    Manual therapy comments  gentle manual STW to left ITB esp distal to reduce pain and tone               PT Short Term Goals - 06/08/18 1341      PT SHORT TERM GOAL #1   Title  STG's=LTG's.        PT Long Term Goals - 06/14/18 0810      PT LONG TERM GOAL #1   Title  Independent with an HEP.    Time  4    Status  On-going      PT LONG TERM GOAL #2   Title  Sit to stand x 5 with left knee pain not > 2-3/10.    Time  4    Period  Weeks    Status  On-going      PT LONG TERM GOAL #3   Title  Perform ADL's with pain not > 2-3/10.    Time  4    Period  Weeks    Status  On-going            Plan - 06/14/18 4166    Clinical Impression Statement  patient tolerated treatment well today. Patient continues to have discomfort in distal ITB and esp when lifting LE or with palpation. Today continued with gentle  strengthening/ROM and manual STW/modalities to help decrease pain. Goals progressing at this time.     Rehab Potential  Excellent    PT Frequency  3x / week    PT Duration  4 weeks    PT Treatment/Interventions  ADLs/Self Care Home Management;Cryotherapy;Electrical Stimulation;Iontophoresis 4mg /ml Dexamethasone;Moist Heat;Therapeutic exercise;Therapeutic activities;Patient/family education;Passive range of motion;Manual techniques    PT Next Visit Plan  ISTAM to affected left ITB especially distal; e'stim; vasopnuematic; Iontophoresis; stationary bike.    Consulted and Agree with Plan of Care  Patient       Patient will benefit from skilled therapeutic intervention in order to improve the following deficits and impairments:  Abnormal gait, Decreased activity tolerance, Decreased range of motion, Pain  Visit Diagnosis: Chronic pain of left knee  Stiffness of left knee, not elsewhere classified     Problem List Patient Active Problem List   Diagnosis Date Noted  . OSA (obstructive sleep apnea) 03/25/2014  . OA (osteoarthritis) of knee 10/22/2013  . Obesity, unspecified 07/02/2013  . Arthritis 07/02/2013  . Mouth dryness 07/02/2013  . Benign paroxysmal positional vertigo 07/02/2013  . Unspecified vitamin D deficiency 07/02/2013  . Hypertension 05/04/2013  . Hyperlipidemia 05/04/2013    Phillips Climes, PTA 06/14/2018, 8:28 AM  [facility] [place], [place], [address] Phone: [phone]   Fax:  [phone]  Name: Bethany [name] MRN: [id number] Date of Birth: 12-05-1951

## 2018-06-16 ENCOUNTER — Ambulatory Visit: Payer: Medicare Other | Admitting: Physical Therapy

## 2018-06-16 DIAGNOSIS — G8929 Other chronic pain: Secondary | ICD-10-CM | POA: Diagnosis not present

## 2018-06-16 DIAGNOSIS — M25562 Pain in left knee: Secondary | ICD-10-CM | POA: Diagnosis not present

## 2018-06-16 DIAGNOSIS — M25662 Stiffness of left knee, not elsewhere classified: Secondary | ICD-10-CM | POA: Diagnosis not present

## 2018-06-16 NOTE — Therapy (Signed)
[facility] [place], [place], [address] Phone: [phone]   Fax:  [phone]  Physical Therapy Treatment  Patient Details  Name: Bethany [name] MRN: [id number] Date of Birth: 1951-06-28 Referring Provider: Gerrit Halls PA-C.   Encounter Date: 06/16/2018  PT End of Session - 06/16/18 0810    Visit Number  4    Number of Visits  12    Date for PT Re-Evaluation  07/06/18    Authorization Type  FOTO AT LEAST EVERY 5TH VISIT, 10TH VISIT PROGRESS NOTE AND KX MODIFIER AFTER THE 15 VISIT.    PT Start Time  0815    PT Stop Time  0913    PT Time Calculation (min)  58 min       Past Medical History:  Diagnosis Date  . Allergy 1997   knees  . Arthritis   . GERD (gastroesophageal reflux disease)   . Hyperlipidemia   . Hypertension   . Obesity   . Pneumonia    1 month ago - rsolved  . Seasonal allergies   . Sleep apnea    cpap    Past Surgical History:  Procedure Laterality Date  . ABDOMINAL HYSTERECTOMY    . BREAST SURGERY     fibroid cysts removed  . GANGLION CYST EXCISION     left  . phlebectomy    . TOTAL KNEE ARTHROPLASTY Right 10/22/2013   Procedure: RIGHT TOTAL KNEE ARTHROPLASTY;  Surgeon: Gearlean Alf, MD;  Location: [facility];  Service: Orthopedics;  Laterality: Right;  . TOTAL KNEE ARTHROPLASTY Left 06/11/2015   Procedure: LEFT TOTAL KNEE ARTHROPLASTY;  Surgeon: Gaynelle Arabian, MD;  Location: [facility];  Service: Orthopedics;  Laterality: Left;  . TUBAL LIGATION      There were no vitals filed for this visit.  Subjective Assessment - 06/16/18 0817    Subjective  Main c/o mild pain across left knee and intermittent swelling in lateral L knee.    Patient Stated Goals  Get out of pain.    Currently in Pain?  Yes    Pain Score  2     Pain Location  Knee    Pain Orientation  Left    Pain Descriptors / Indicators  Aching    Pain Type  Chronic pain                       [facility] Adult PT  Treatment/Exercise - 06/16/18 0001      Knee/Hip Exercises: Stretches   Sports administrator  Left;1 rep;60 seconds    Other Knee/Hip Stretches  mulitple ITB stretches attempted but she couldn't feel them; mild stretch with supine C      Knee/Hip Exercises: Aerobic   Nustep  10 min L4 UE/LE      Knee/Hip Exercises: Standing   Hip Abduction  Stengthening;Both;15 reps    Forward Step Up  Left;1 set;15 reps;Step Height: 4";Hand Hold: 1      Knee/Hip Exercises: Supine   Bridges with Clamshell  Strengthening;Both;2 sets;10 reps static hold of clam    Other Supine Knee/Hip Exercises  supine hip abd with red t-band x30    Other Supine Knee/Hip Exercises  hip flex red band around feet x 20 ea      Modalities   Modalities  Electrical Stimulation;Vasopneumatic;Iontophoresis      Electrical Stimulation   Electrical Stimulation Location  L knee IFC 1-10 Hz x 69min    Electrical Stimulation Goals  Pain;Edema  Iontophoresis   Type of Iontophoresis  Dexamethasone    Location  Left distal ITB    Dose  1 ml    Time  4 hour patch      Vasopneumatic   Number Minutes Vasopneumatic   15 minutes    Vasopnuematic Location   Knee    Vasopneumatic Pressure  Medium    Vasopneumatic Temperature   34               PT Short Term Goals - 06/08/18 1341      PT SHORT TERM GOAL #1   Title  STG's=LTG's.        PT Long Term Goals - 06/16/18 0824      PT LONG TERM GOAL #4   Title  -------------------------      PT LONG TERM GOAL #5   Title  ------------------------------            Plan - 06/16/18 3244    Clinical Impression Statement  Patient did well with therapy today. She still is very tight in lateral quads with MFR. Also gave her prone quad stretch. Multiple ITB stretches attempted, but she couldn't feel any of them. Significant crepitis with step ups.    PT Treatment/Interventions  ADLs/Self Care Home Management;Cryotherapy;Electrical Stimulation;Iontophoresis 4mg /ml  Dexamethasone;Moist Heat;Therapeutic exercise;Therapeutic activities;Patient/family education;Passive range of motion;Manual techniques    PT Next Visit Plan  continue quad flexibilty; ISTAM to affected left ITB especially distal; e'stim; vasopnuematic; Iontophoresis; stationary bike.    PT Home Exercise Plan  prone quad stretch with strap       Patient will benefit from skilled therapeutic intervention in order to improve the following deficits and impairments:  Abnormal gait, Decreased activity tolerance, Decreased range of motion, Pain  Visit Diagnosis: Chronic pain of left knee  Stiffness of left knee, not elsewhere classified     Problem List Patient Active Problem List   Diagnosis Date Noted  . OSA (obstructive sleep apnea) 03/25/2014  . OA (osteoarthritis) of knee 10/22/2013  . Obesity, unspecified 07/02/2013  . Arthritis 07/02/2013  . Mouth dryness 07/02/2013  . Benign paroxysmal positional vertigo 07/02/2013  . Unspecified vitamin D deficiency 07/02/2013  . Hypertension 05/04/2013  . Hyperlipidemia 05/04/2013    Cavon Nicolls PT 06/16/2018, 9:00 AM  [facility] [address] [place], [place], [address] Phone: [phone]   Fax:  [phone]  Name: Bethany [name] MRN: [id number] Date of Birth: 1951-06-23

## 2018-06-16 NOTE — Patient Instructions (Signed)
Quadriceps (Prone)   On stomach with sheet around ankles, knees together, hips down, pull heels toward bottom. Keep hips flat. Hold __30-60__ seconds. Repeat _3__ times. Do __3__ sessions per day. CAUTION: Stretch should be gentle, steady and slow.   Madelyn Flavors, PT 06/16/18 8:54 AM; [facility] [place], [place], [address] Phone: [phone]   Fax:  [phone]

## 2018-06-19 ENCOUNTER — Encounter: Payer: Self-pay | Admitting: Physical Therapy

## 2018-06-19 ENCOUNTER — Ambulatory Visit: Payer: Medicare Other | Admitting: Physical Therapy

## 2018-06-19 DIAGNOSIS — G8929 Other chronic pain: Secondary | ICD-10-CM | POA: Diagnosis not present

## 2018-06-19 DIAGNOSIS — M25562 Pain in left knee: Principal | ICD-10-CM

## 2018-06-19 DIAGNOSIS — M25662 Stiffness of left knee, not elsewhere classified: Secondary | ICD-10-CM | POA: Diagnosis not present

## 2018-06-19 NOTE — Telephone Encounter (Signed)
Left message to call back  

## 2018-06-19 NOTE — Therapy (Signed)
[facility] [place], [place], [address] Phone: [phone]   Fax:  [phone]  Physical Therapy Treatment  Patient Details  Name: Bethany [name] MRN: [id number] Date of Birth: 1951-01-27 Referring Provider: Gerrit Halls PA-C.   Encounter Date: 06/19/2018  PT End of Session - 06/19/18 0808    Visit Number  5    Number of Visits  12    Date for PT Re-Evaluation  07/06/18    Authorization Type  FOTO AT LEAST EVERY 5TH VISIT, 10TH VISIT PROGRESS NOTE AND KX MODIFIER AFTER THE 15 VISIT.    PT Start Time  0730    PT Stop Time  0819    PT Time Calculation (min)  49 min    Activity Tolerance  Patient tolerated treatment well    Behavior During Therapy  WFL for tasks assessed/performed       Past Medical History:  Diagnosis Date  . Allergy 1997   knees  . Arthritis   . GERD (gastroesophageal reflux disease)   . Hyperlipidemia   . Hypertension   . Obesity   . Pneumonia    1 month ago - rsolved  . Seasonal allergies   . Sleep apnea    cpap    Past Surgical History:  Procedure Laterality Date  . ABDOMINAL HYSTERECTOMY    . BREAST SURGERY     fibroid cysts removed  . GANGLION CYST EXCISION     left  . phlebectomy    . TOTAL KNEE ARTHROPLASTY Right 10/22/2013   Procedure: RIGHT TOTAL KNEE ARTHROPLASTY;  Surgeon: Gearlean Alf, MD;  Location: [facility];  Service: Orthopedics;  Laterality: Right;  . TOTAL KNEE ARTHROPLASTY Left 06/11/2015   Procedure: LEFT TOTAL KNEE ARTHROPLASTY;  Surgeon: Gaynelle Arabian, MD;  Location: [facility];  Service: Orthopedics;  Laterality: Left;  . TUBAL LIGATION      There were no vitals filed for this visit.  Subjective Assessment - 06/19/18 0734    Subjective  Patient arrived and did well after last treatment , less pain overall today    Pertinent History  Bilateral TKA's.    Patient Stated Goals  Get out of pain.    Currently in Pain?  Yes    Pain Score  1     Pain Location  Knee     Pain Orientation  Left    Pain Descriptors / Indicators  Aching    Pain Type  Chronic pain    Pain Onset  More than a month ago    Pain Frequency  Constant    Aggravating Factors   bending or lifting LE    Pain Relieving Factors  at rest                       [facility] Adult PT Treatment/Exercise - 06/19/18 0001      Knee/Hip Exercises: Aerobic   Recumbent Bike  15 min L1      Knee/Hip Exercises: Standing   Hip Abduction  Stengthening;Left;2 sets;10 reps;Knee straight    Abduction Limitations  red t-band    Forward Step Up  Left;2 sets;10 reps;Step Height: 6"      Knee/Hip Exercises: Supine   Bridges with Ball Squeeze  Strengthening;Both;20 reps    Other Supine Knee/Hip Exercises  supine hip abd with green t-band x30      Electrical Stimulation   Electrical Stimulation Location  L knee IFC 1-10 Hz x 48mn    Electrical Stimulation  Goals  Pain;Edema      Iontophoresis   Type of Iontophoresis  Dexamethasone    Location  Left distal ITB    Dose  1 ml 5 of 6    Time  8      Vasopneumatic   Number Minutes Vasopneumatic   15 minutes    Vasopnuematic Location   Knee    Vasopneumatic Pressure  Medium               PT Short Term Goals - 06/08/18 1341      PT SHORT TERM GOAL #1   Title  STG's=LTG's.        PT Long Term Goals - 06/19/18 0809      PT LONG TERM GOAL #1   Title  Independent with an HEP.    Time  4    Period  Weeks    Status  Achieved      PT LONG TERM GOAL #2   Title  Sit to stand x 5 with left knee pain not > 2-3/10.    Time  4    Period  Weeks    Status  On-going      PT LONG TERM GOAL #3   Title  Perform ADL's with pain not > 2-3/10.    Time  4    Period  Weeks    Status  Achieved 2/10 06/19/18            Plan - 06/19/18 0815    Clinical Impression Statement  Patient tolerated treament well today. Patient reported overall improvement with less pain and able to complete ADL's with greater ease and less discomfort.  Patient doing quad stretch for hEP daily and feels that has helped decrease her pain. Patient met LTG #1 and #3 today.     Clinical Presentation due to:  FOTO 5th visit 36% (Initial 36%)    Rehab Potential  Excellent    PT Frequency  3x / week    PT Duration  4 weeks    PT Treatment/Interventions  ADLs/Self Care Home Management;Cryotherapy;Electrical Stimulation;Iontophoresis 33m/ml Dexamethasone;Moist Heat;Therapeutic exercise;Therapeutic activities;Patient/family education;Passive range of motion;Manual techniques    PT Next Visit Plan  continue quad flexibilty; ISTAM to affected left ITB especially distal; e'stim; vasopnuematic; Iontophoresis; stationary bike.    Consulted and Agree with Plan of Care  Patient       Patient will benefit from skilled therapeutic intervention in order to improve the following deficits and impairments:  Abnormal gait, Decreased activity tolerance, Decreased range of motion, Pain  Visit Diagnosis: Chronic pain of left knee  Stiffness of left knee, not elsewhere classified     Problem List Patient Active Problem List   Diagnosis Date Noted  . OSA (obstructive sleep apnea) 03/25/2014  . OA (osteoarthritis) of knee 10/22/2013  . Obesity, unspecified 07/02/2013  . Arthritis 07/02/2013  . Mouth dryness 07/02/2013  . Benign paroxysmal positional vertigo 07/02/2013  . Unspecified vitamin D deficiency 07/02/2013  . Hypertension 05/04/2013  . Hyperlipidemia 05/04/2013    DPhillips Climes PTA 06/19/2018, 8:24 AM  [facility] [address]Phone: [phone]  Fax:  [phone] Name: Bethany FunderburkeMRN: [id number] of Birth: [birth year]

## 2018-06-21 ENCOUNTER — Encounter: Payer: Self-pay | Admitting: Physical Therapy

## 2018-06-21 ENCOUNTER — Ambulatory Visit: Payer: Medicare Other | Admitting: Physical Therapy

## 2018-06-21 DIAGNOSIS — G8929 Other chronic pain: Secondary | ICD-10-CM | POA: Diagnosis not present

## 2018-06-21 DIAGNOSIS — M25562 Pain in left knee: Principal | ICD-10-CM

## 2018-06-21 DIAGNOSIS — M25662 Stiffness of left knee, not elsewhere classified: Secondary | ICD-10-CM

## 2018-06-21 NOTE — Therapy (Signed)
[facility] [place], [place], [address] Phone: [phone]   Fax:  [phone]  Physical Therapy Treatment  Patient Details  Name: Bethany [name] MRN: [id number] Date of Birth: 01/13/1951 Referring Provider: Gerrit Halls PA-C.   Encounter Date: 06/21/2018  PT End of Session - 06/21/18 0825    Visit Number  6    Number of Visits  12    Date for PT Re-Evaluation  07/06/18    Authorization Type  FOTO AT LEAST EVERY 5TH VISIT, 10TH VISIT PROGRESS NOTE AND KX MODIFIER AFTER THE 15 VISIT.    PT Start Time  [id number]    PT Stop Time  0911    PT Time Calculation (min)  54 min    Activity Tolerance  Patient tolerated treatment well    Behavior During Therapy  [facility] for tasks assessed/performed       Past Medical History:  Diagnosis Date  . Allergy 1997   knees  . Arthritis   . GERD (gastroesophageal reflux disease)   . Hyperlipidemia   . Hypertension   . Obesity   . Pneumonia    1 month ago - rsolved  . Seasonal allergies   . Sleep apnea    cpap    Past Surgical History:  Procedure Laterality Date  . ABDOMINAL HYSTERECTOMY    . BREAST SURGERY     fibroid cysts removed  . GANGLION CYST EXCISION     left  . phlebectomy    . TOTAL KNEE ARTHROPLASTY Right 10/22/2013   Procedure: RIGHT TOTAL KNEE ARTHROPLASTY;  Surgeon: Gearlean Alf, MD;  Location: [facility];  Service: Orthopedics;  Laterality: Right;  . TOTAL KNEE ARTHROPLASTY Left 06/11/2015   Procedure: LEFT TOTAL KNEE ARTHROPLASTY;  Surgeon: Gaynelle Arabian, MD;  Location: [facility];  Service: Orthopedics;  Laterality: Left;  . TUBAL LIGATION      There were no vitals filed for this visit.  Subjective Assessment - 06/21/18 0086    Subjective  Patient arrived with pain reported a "half"    Pertinent History  Bilateral TKA's.    Patient Stated Goals  Get out of pain.    Currently in Pain?  Yes    Pain Score  -- "1/2"    Pain Location  Knee    Pain Orientation  Left     Pain Descriptors / Indicators  Discomfort    Pain Type  Chronic pain    Pain Onset  More than a month ago         [facility] PT Assessment - 06/21/18 0001      Assessment   Medical Diagnosis  Left knee pain.    Onset Date/Surgical Date  05/13/18    Next MD Visit  06/28/2018      Restrictions   Weight Bearing Restrictions  No                   [facility] Adult PT Treatment/Exercise - 06/21/18 0001      Knee/Hip Exercises: Aerobic   Recumbent Bike  L6 x15 min      Knee/Hip Exercises: Standing   Terminal Knee Extension  Strengthening;Left;2 sets;10 reps;Theraband    Theraband Level (Terminal Knee Extension)  Level 2 (Red)    Hip Abduction  AROM;Left;20 reps;Knee straight    Lateral Step Up  Left;3 sets;10 reps;Hand Hold: 2;Step Height: 6"    Forward Step Up  Left;3 sets;10 reps;Hand Hold: 2;Step Height: 6"      Knee/Hip Exercises: Seated  Long Arc [employer]  Strengthening;Left;2 sets;10 reps;Weights;Limitations    Long Arc [employer]  3 lbs.    Long [employer] Limitations  within comfortable range      Electrical engineer;Iontophoresis      Acupuncturist Location  L knee    Electrical Stimulation Action  IFC    Electrical Stimulation Parameters  80-150 hz x15 min    Electrical Stimulation Goals  Pain;Edema      Iontophoresis   Type of Iontophoresis  Dexamethasone    Location  Left distal ITB    Dose  1 ml 6 of 6    Time  8      Vasopneumatic   Number Minutes Vasopneumatic   15 minutes    Vasopnuematic Location   Knee    Vasopneumatic Pressure  Low    Vasopneumatic Temperature   34               PT Short Term Goals - 06/08/18 1341      PT SHORT TERM GOAL #1   Title  STG's=LTG's.        PT Long Term Goals - 06/19/18 0809      PT LONG TERM GOAL #1   Title  Independent with an HEP.    Time  4    Period  Weeks    Status  Achieved      PT LONG TERM GOAL #2   Title  Sit  to stand x 5 with left knee pain not > 2-3/10.    Time  4    Period  Weeks    Status  On-going      PT LONG TERM GOAL #3   Title  Perform ADL's with pain not > 2-3/10.    Time  4    Period  Weeks    Status  Achieved 2/10 06/19/18            Plan - 06/21/18 0854    Clinical Impression Statement  Patient tolerated today's treatment well as she arrived with very low L knee pain. Patient still presenting with palpable L ITB tightness and inability to fully extend L knee with LAQ like she could with standing L TKE. Patient able to tolerate all standing exercises well with no complaints of any increased pain. Nromal modalities response noted following removal of the modalities. Iontophoresis patch donned to distal L ITB.    Rehab Potential  Excellent    PT Frequency  3x / week    PT Duration  4 weeks    PT Treatment/Interventions  ADLs/Self Care Home Management;Cryotherapy;Electrical Stimulation;Iontophoresis 4mg /ml Dexamethasone;Moist Heat;Therapeutic exercise;Therapeutic activities;Patient/family education;Passive range of motion;Manual techniques    PT Next Visit Plan  continue quad flexibilty; ISTAM to affected left ITB especially distal; e'stim; vasopnuematic; Iontophoresis; stationary bike.    PT Home Exercise Plan  prone quad stretch with strap    Consulted and Agree with Plan of Care  Patient       Patient will benefit from skilled therapeutic intervention in order to improve the following deficits and impairments:  Abnormal gait, Decreased activity tolerance, Decreased range of motion, Pain  Visit Diagnosis: Chronic pain of left knee  Stiffness of left knee, not elsewhere classified     Problem List Patient Active Problem List   Diagnosis Date Noted  . OSA (obstructive sleep apnea) 03/25/2014  . OA (osteoarthritis) of knee 10/22/2013  . Obesity, unspecified 07/02/2013  . Arthritis 07/02/2013  .  Mouth dryness 07/02/2013  . Benign paroxysmal positional vertigo  07/02/2013  . Unspecified vitamin D deficiency 07/02/2013  . Hypertension 05/04/2013  . Hyperlipidemia 05/04/2013    Standley Brooking, PTA 06/21/2018, 9:14 AM  [facility] [address] [place], [place], [address] Phone: [phone]   Fax:  [phone]  Name: Bethany [name] MRN: [id number] Date of Birth: 04-29-1951

## 2018-06-23 ENCOUNTER — Encounter: Payer: Self-pay | Admitting: Physical Therapy

## 2018-06-23 ENCOUNTER — Ambulatory Visit: Payer: Medicare Other | Admitting: Physical Therapy

## 2018-06-23 ENCOUNTER — Ambulatory Visit (INDEPENDENT_AMBULATORY_CARE_PROVIDER_SITE_OTHER): Payer: Medicare Other

## 2018-06-23 DIAGNOSIS — M25562 Pain in left knee: Secondary | ICD-10-CM | POA: Diagnosis not present

## 2018-06-23 DIAGNOSIS — Z780 Asymptomatic menopausal state: Secondary | ICD-10-CM | POA: Diagnosis not present

## 2018-06-23 DIAGNOSIS — Z13820 Encounter for screening for osteoporosis: Secondary | ICD-10-CM | POA: Diagnosis not present

## 2018-06-23 DIAGNOSIS — G8929 Other chronic pain: Secondary | ICD-10-CM

## 2018-06-23 DIAGNOSIS — M25662 Stiffness of left knee, not elsewhere classified: Secondary | ICD-10-CM | POA: Diagnosis not present

## 2018-06-23 NOTE — Therapy (Signed)
[facility] [place], [place], [address] Phone: [phone]   Fax:  [phone]  Physical Therapy Treatment  Patient Details  Name: Bethany [name] MRN: [id number] Date of Birth: 12-06-1951 Referring Provider: Gerrit Halls PA-C.   Encounter Date: 06/23/2018  PT End of Session - 06/23/18 0754    Visit Number  7    Number of Visits  12    Date for PT Re-Evaluation  07/06/18    Authorization Type  FOTO AT LEAST EVERY 5TH VISIT, 10TH VISIT PROGRESS NOTE AND KX MODIFIER AFTER THE 15 VISIT.    PT Start Time  0732    PT Stop Time  0822    PT Time Calculation (min)  50 min    Activity Tolerance  Patient tolerated treatment well    Behavior During Therapy  [facility] for tasks assessed/performed       Past Medical History:  Diagnosis Date  . Allergy 1997   knees  . Arthritis   . GERD (gastroesophageal reflux disease)   . Hyperlipidemia   . Hypertension   . Obesity   . Pneumonia    1 month ago - rsolved  . Seasonal allergies   . Sleep apnea    cpap    Past Surgical History:  Procedure Laterality Date  . ABDOMINAL HYSTERECTOMY    . BREAST SURGERY     fibroid cysts removed  . GANGLION CYST EXCISION     left  . phlebectomy    . TOTAL KNEE ARTHROPLASTY Right 10/22/2013   Procedure: RIGHT TOTAL KNEE ARTHROPLASTY;  Surgeon: Gearlean Alf, MD;  Location: [facility];  Service: Orthopedics;  Laterality: Right;  . TOTAL KNEE ARTHROPLASTY Left 06/11/2015   Procedure: LEFT TOTAL KNEE ARTHROPLASTY;  Surgeon: Gaynelle Arabian, MD;  Location: [facility];  Service: Orthopedics;  Laterality: Left;  . TUBAL LIGATION      There were no vitals filed for this visit.  Subjective Assessment - 06/23/18 0731    Subjective  Reports that her knee is good this morning.    Pertinent History  Bilateral TKA's.    Patient Stated Goals  Get out of pain.    Currently in Pain?  Yes    Pain Score  -- "1/2"    Pain Location  Knee    Pain Orientation  Left     Pain Descriptors / Indicators  Discomfort    Pain Type  Chronic pain    Pain Onset  More than a month ago    Pain Frequency  Constant         [facility] PT Assessment - 06/23/18 0001      Assessment   Medical Diagnosis  Left knee pain.    Onset Date/Surgical Date  05/13/18    Next MD Visit  06/28/2018      Restrictions   Weight Bearing Restrictions  No                   [facility] Adult PT Treatment/Exercise - 06/23/18 0001      Knee/Hip Exercises: Aerobic   Recumbent Bike  L6 x15 min      Knee/Hip Exercises: Standing   Terminal Knee Extension  Strengthening;Left;2 sets;10 reps;Theraband    Theraband Level (Terminal Knee Extension)  Level 2 (Red)    Hip Abduction  Stengthening;Left;2 sets;10 reps;Knee straight;Limitations    Abduction Limitations  Red theraband    Lateral Step Up  Left;1 set;10 reps;Hand Hold: 2;Step Height: 6"    Forward Step  Up  Left;2 sets;10 reps;Hand Hold: 2;Step Height: 6"    Other Standing Knee Exercises  L standing HS curl 3x10 reps 3#      Knee/Hip Exercises: Seated   Long Arc Quad  Strengthening;Left;2 sets;10 reps;Weights;Limitations    Long Arc Quad Weight  3 lbs.    Long Arc Quad Limitations  within comfortable range      Knee/Hip Exercises: Supine   Straight Leg Raises  AROM;Left;3 sets;10 reps      Modalities   Modalities  Electrical Stimulation;Vasopneumatic;Iontophoresis      Acupuncturist Location  L knee    Electrical Stimulation Action  IFC    Electrical Stimulation Parameters  80-150 hz x15 min    Electrical Stimulation Goals  Pain;Edema      Vasopneumatic   Number Minutes Vasopneumatic   15 minutes    Vasopnuematic Location   Knee    Vasopneumatic Pressure  Low    Vasopneumatic Temperature   34               PT Short Term Goals - 06/08/18 1341      PT SHORT TERM GOAL #1   Title  STG's=LTG's.        PT Long Term Goals - 06/19/18 0809      PT LONG TERM GOAL #1   Title   Independent with an HEP.    Time  4    Period  Weeks    Status  Achieved      PT LONG TERM GOAL #2   Title  Sit to stand x 5 with left knee pain not > 2-3/10.    Time  4    Period  Weeks    Status  On-going      PT LONG TERM GOAL #3   Title  Perform ADL's with pain not > 2-3/10.    Time  4    Period  Weeks    Status  Achieved 2/10 06/19/18            Plan - 06/23/18 0815    Clinical Impression Statement  Patient tolerated today's treatment well as she continues to report "1/2" point of pain which is a constant discomfort. Patient able to complete exercises as directed although limited with LAQ due to excessive crepitus as well as lateral step ups. Good L quad strength noted with SLR without extensor lag.  Normal modalities response noted following removal of the modalities.     Rehab Potential  Excellent    PT Frequency  3x / week    PT Duration  4 weeks    PT Treatment/Interventions  ADLs/Self Care Home Management;Cryotherapy;Electrical Stimulation;Iontophoresis 4mg /ml Dexamethasone;Moist Heat;Therapeutic exercise;Therapeutic activities;Patient/family education;Passive range of motion;Manual techniques    PT Next Visit Plan  continue quad flexibilty; ISTAM to affected left ITB especially distal; e'stim; vasopnuematic; Iontophoresis; stationary bike.    PT Home Exercise Plan  prone quad stretch with strap    Consulted and Agree with Plan of Care  Patient       Patient will benefit from skilled therapeutic intervention in order to improve the following deficits and impairments:  Abnormal gait, Decreased activity tolerance, Decreased range of motion, Pain  Visit Diagnosis: Chronic pain of left knee  Stiffness of left knee, not elsewhere classified     Problem List Patient Active Problem List   Diagnosis Date Noted  . OSA (obstructive sleep apnea) 03/25/2014  . OA (osteoarthritis) of knee 10/22/2013  . Obesity,  unspecified 07/02/2013  . Arthritis 07/02/2013  . Mouth  dryness 07/02/2013  . Benign paroxysmal positional vertigo 07/02/2013  . Unspecified vitamin D deficiency 07/02/2013  . Hypertension 05/04/2013  . Hyperlipidemia 05/04/2013    Standley Brooking, PTA 06/23/2018, 8:49 AM  [facility] [address] [place], [place], [address] Phone: [phone]   Fax:  [phone]  Name: Bethany [name] MRN: [id number] Date of Birth: [birth year]

## 2018-06-26 ENCOUNTER — Ambulatory Visit: Payer: Medicare Other | Attending: Physician Assistant | Admitting: Physical Therapy

## 2018-06-26 ENCOUNTER — Encounter: Payer: Self-pay | Admitting: Physical Therapy

## 2018-06-26 DIAGNOSIS — M25562 Pain in left knee: Secondary | ICD-10-CM | POA: Diagnosis not present

## 2018-06-26 DIAGNOSIS — M25662 Stiffness of left knee, not elsewhere classified: Secondary | ICD-10-CM | POA: Diagnosis not present

## 2018-06-26 DIAGNOSIS — G8929 Other chronic pain: Secondary | ICD-10-CM | POA: Insufficient documentation

## 2018-06-26 NOTE — Therapy (Signed)
[facility] [place], [place], [address] Phone: [phone]   Fax:  [phone]  Physical Therapy Treatment  Patient Details  Name: Bethany [name] MRN: [id number] Date of Birth: Aug 23, 1951 Referring Provider: Gerrit Halls PA-C.   Encounter Date: 06/26/2018  PT End of Session - 06/26/18 0737    Visit Number  8    Number of Visits  12    Date for PT Re-Evaluation  07/06/18    Authorization Type  FOTO AT LEAST EVERY 5TH VISIT, 10TH VISIT PROGRESS NOTE AND KX MODIFIER AFTER THE 15 VISIT.    PT Start Time  [phone]    PT Stop Time  0825    PT Time Calculation (min)  51 min    Activity Tolerance  Patient tolerated treatment well    Behavior During Therapy  [facility] for tasks assessed/performed       Past Medical History:  Diagnosis Date  . Allergy 1997   knees  . Arthritis   . GERD (gastroesophageal reflux disease)   . Hyperlipidemia   . Hypertension   . Obesity   . Pneumonia    1 month ago - rsolved  . Seasonal allergies   . Sleep apnea    cpap    Past Surgical History:  Procedure Laterality Date  . ABDOMINAL HYSTERECTOMY    . BREAST SURGERY     fibroid cysts removed  . GANGLION CYST EXCISION     left  . phlebectomy    . TOTAL KNEE ARTHROPLASTY Right 10/22/2013   Procedure: RIGHT TOTAL KNEE ARTHROPLASTY;  Surgeon: Gearlean Alf, MD;  Location: [facility];  Service: Orthopedics;  Laterality: Right;  . TOTAL KNEE ARTHROPLASTY Left 06/11/2015   Procedure: LEFT TOTAL KNEE ARTHROPLASTY;  Surgeon: Gaynelle Arabian, MD;  [facility]: [facility];  Service: Orthopedics;  Laterality: Left;  . TUBAL LIGATION      There were no vitals filed for this visit.  Subjective Assessment - 06/26/18 0736    Subjective  Reports that her knee is good.    Pertinent History  Bilateral TKA's.    Patient Stated Goals  Get out of pain.    Currently in Pain?  Yes    Pain Score  -- "1/2"    Pain Location  Knee    Pain Orientation  Left    Pain  Descriptors / Indicators  Constant;Discomfort    Pain Type  Chronic pain    Pain Onset  More than a month ago    Pain Frequency  Constant         [facility] PT Assessment - 06/26/18 0001      Assessment   Medical Diagnosis  Left knee pain.    Onset Date/Surgical Date  05/13/18    Next MD Visit  06/28/2018      Restrictions   Weight Bearing Restrictions  No                   [facility] Adult PT Treatment/Exercise - 06/26/18 0001      Knee/Hip Exercises: Aerobic   Recumbent Bike  L5 x18 min      Knee/Hip Exercises: Standing   Terminal Knee Extension  Strengthening;Left;2 sets;10 reps;Theraband    Theraband Level (Terminal Knee Extension)  Level 3 (Green)    Hip Abduction  AROM;Both;3 sets;10 reps;Knee straight    Forward Step Up  Left;2 sets;10 reps;Hand Hold: 2;Step Height: 6"      Knee/Hip Exercises: Supine   Bridges  2 sets;10 reps  Straight Leg Raises  AROM;Left;2 sets;10 reps    Straight Leg Raise with External Rotation  AROM;Left;2 sets;10 reps      Modalities   Modalities  Psychologist, educational Location  L knee    Electrical Stimulation Action  IFC    Electrical Stimulation Parameters  80-150 hz x15 min    Electrical Stimulation Goals  Pain;Edema      Vasopneumatic   Number Minutes Vasopneumatic   15 minutes    Vasopnuematic Location   Knee    Vasopneumatic Pressure  Low    Vasopneumatic Temperature   34               PT Short Term Goals - 06/08/18 1341      PT SHORT TERM GOAL #1   Title  STG's=LTG's.        PT Long Term Goals - 06/19/18 0809      PT LONG TERM GOAL #1   Title  Independent with an HEP.    Time  4    Period  Weeks    Status  Achieved      PT LONG TERM GOAL #2   Title  Sit to stand x 5 with left knee pain not > 2-3/10.    Time  4    Period  Weeks    Status  On-going      PT LONG TERM GOAL #3   Title  Perform ADL's with pain not > 2-3/10.    Time   4    Period  Weeks    Status  Achieved 2/10 06/19/18            Plan - 06/26/18 0815    Clinical Impression Statement  Patient tolerated today's treatment well as she continues to report low level L knee pain but is constant. Patient able to complete exercises with progression as far as resistance and new exercises. No complaints with any exercises today. SLS on LLE completed with B hip abduction and good stability noted with UE support. Normal modalities response noted following removal of the modalities. 8th visit FOTO score 23%.    Rehab Potential  Excellent    PT Frequency  3x / week    PT Duration  4 weeks    PT Treatment/Interventions  ADLs/Self Care Home Management;Cryotherapy;Electrical Stimulation;Iontophoresis 4mg /ml Dexamethasone;Moist Heat;Therapeutic exercise;Therapeutic activities;Patient/family education;Passive range of motion;Manual techniques    PT Next Visit Plan  continue quad flexibilty; ISTAM to affected left ITB especially distal; e'stim; vasopnuematic; Iontophoresis; stationary bike.    PT Home Exercise Plan  prone quad stretch with strap    Consulted and Agree with Plan of Care  Patient       Patient will benefit from skilled therapeutic intervention in order to improve the following deficits and impairments:  Abnormal gait, Decreased activity tolerance, Decreased range of motion, Pain  Visit Diagnosis: Chronic pain of left knee  Stiffness of left knee, not elsewhere classified     Problem List Patient Active Problem List   Diagnosis Date Noted  . OSA (obstructive sleep apnea) 03/25/2014  . OA (osteoarthritis) of knee 10/22/2013  . Obesity, unspecified 07/02/2013  . Arthritis 07/02/2013  . Mouth dryness 07/02/2013  . Benign paroxysmal positional vertigo 07/02/2013  . Unspecified vitamin D deficiency 07/02/2013  . Hypertension 05/04/2013  . Hyperlipidemia 05/04/2013    Standley Brooking, PTA 06/26/2018, 8:28 AM  [facility] [place]  [place], [place], [address] Phone: [phone]   Fax:  [phone]  Name: Venissa Nappi MRN: [id number] Date of Birth: 1951-08-18

## 2018-06-27 ENCOUNTER — Telehealth: Payer: Self-pay | Admitting: Family Medicine

## 2018-06-27 NOTE — Telephone Encounter (Signed)
Multiple attempts made to contact patient.  This encounter will now be closed  

## 2018-06-30 ENCOUNTER — Encounter: Payer: Self-pay | Admitting: Physical Therapy

## 2018-06-30 ENCOUNTER — Ambulatory Visit: Payer: Medicare Other | Admitting: Physical Therapy

## 2018-06-30 DIAGNOSIS — G8929 Other chronic pain: Secondary | ICD-10-CM

## 2018-06-30 DIAGNOSIS — M25662 Stiffness of left knee, not elsewhere classified: Secondary | ICD-10-CM

## 2018-06-30 DIAGNOSIS — M25562 Pain in left knee: Principal | ICD-10-CM

## 2018-06-30 NOTE — Therapy (Signed)
[facility] [place], [place], [address] Phone: [phone]   Fax:  [phone]  Physical Therapy Treatment  Patient Details  Name: Bethany [name] MRN: [id number] Date of Birth: 1951/10/13 Referring Provider: Gerrit Halls PA-C.   Encounter Date: 06/30/2018  PT End of Session - 06/30/18 0741    Visit Number  9    Number of Visits  12    Date for PT Re-Evaluation  07/06/18    Authorization Type  FOTO AT LEAST EVERY 5TH VISIT, 10TH VISIT PROGRESS NOTE AND KX MODIFIER AFTER THE 15 VISIT.    PT Start Time  [phone]    PT Stop Time  0824    PT Time Calculation (min)  51 min    Activity Tolerance  Patient tolerated treatment well    Behavior During Therapy  [facility] for tasks assessed/performed       Past Medical History:  Diagnosis Date  . Allergy 1997   knees  . Arthritis   . GERD (gastroesophageal reflux disease)   . Hyperlipidemia   . Hypertension   . Obesity   . Pneumonia    1 month ago - rsolved  . Seasonal allergies   . Sleep apnea    cpap    Past Surgical History:  Procedure Laterality Date  . ABDOMINAL HYSTERECTOMY    . BREAST SURGERY     fibroid cysts removed  . GANGLION CYST EXCISION     left  . phlebectomy    . TOTAL KNEE ARTHROPLASTY Right 10/22/2013   Procedure: RIGHT TOTAL KNEE ARTHROPLASTY;  Surgeon: Gearlean Alf, MD;  Location: [facility];  Service: Orthopedics;  Laterality: Right;  . TOTAL KNEE ARTHROPLASTY Left 06/11/2015   Procedure: LEFT TOTAL KNEE ARTHROPLASTY;  Surgeon: Gaynelle Arabian, MD;  Location: [facility];  Service: Orthopedics;  Laterality: Left;  . TUBAL LIGATION      There were no vitals filed for this visit.  Subjective Assessment - 06/30/18 0735    Subjective  Reports that her knee is good.    Pertinent History  Bilateral TKA's.    Patient Stated Goals  Get out of pain.    Currently in Pain?  Yes    Pain Score  -- "1/2"    Pain Location  Knee    Pain Orientation  Left    Pain  Descriptors / Indicators  Discomfort    Pain Type  Chronic pain    Pain Onset  More than a month ago         [facility] PT Assessment - 06/30/18 0001      Assessment   Medical Diagnosis  Left knee pain.    Onset Date/Surgical Date  05/13/18    Next MD Visit  07/14/2018      Restrictions   Weight Bearing Restrictions  No                   [facility] Adult PT Treatment/Exercise - 06/30/18 0001      Knee/Hip Exercises: Aerobic   Recumbent Bike  L5 x18 min      Knee/Hip Exercises: Standing   Terminal Knee Extension  Strengthening;Left;2 sets;10 reps;Theraband    Theraband Level (Terminal Knee Extension)  Level 3 (Green)    Hip Abduction  AROM;Left;2 sets;10 reps;Knee straight    Forward Step Up  Left;2 sets;10 reps;Hand Hold: 2;Step Height: 6"      Knee/Hip Exercises: Supine   Bridges  2 sets;10 reps    Straight Leg Raises  AROM;Left;2 sets;10 reps    Straight Leg Raise with External Rotation  AROM;Left;2 sets;10 reps      Modalities   Modalities  Psychologist, educational Location  L knee    Electrical Stimulation Action  IFC    Electrical Stimulation Parameters  80-150 hz x15 min    Electrical Stimulation Goals  Pain;Edema      Vasopneumatic   Number Minutes Vasopneumatic   15 minutes    Vasopnuematic Location   Knee    Vasopneumatic Pressure  Low    Vasopneumatic Temperature   34             PT Education - 06/30/18 0816    Education Details  HEP- SLR, SLR with ER, bridges    Person(s) Educated  Patient    Methods  Explanation;Verbal cues;Handout    Comprehension  Verbalized understanding       PT Short Term Goals - 06/08/18 1341      PT SHORT TERM GOAL #1   Title  STG's=LTG's.        PT Long Term Goals - 06/19/18 0809      PT LONG TERM GOAL #1   Title  Independent with an HEP.    Time  4    Period  Weeks    Status  Achieved      PT LONG TERM GOAL #2   Title  Sit to stand x  5 with left knee pain not > 2-3/10.    Time  4    Period  Weeks    Status  On-going      PT LONG TERM GOAL #3   Title  Perform ADL's with pain not > 2-3/10.    Time  4    Period  Weeks    Status  Achieved 2/10 06/19/18            Plan - 06/30/18 0825    Clinical Impression Statement  Patient tolerated today's treatment very well today as she had no complaints of any increased L knee pain. Patient able to complete all exercises with good technique and no complaints of crepitus during exercises. Patient provided new HEP for quad and HS strengthening with patient verbalizing understanding. Normal modalities response noted following removal of the modalities.     Rehab Potential  Excellent    PT Frequency  3x / week    PT Duration  4 weeks    PT Treatment/Interventions  ADLs/Self Care Home Management;Cryotherapy;Electrical Stimulation;Iontophoresis 4mg /ml Dexamethasone;Moist Heat;Therapeutic exercise;Therapeutic activities;Patient/family education;Passive range of motion;Manual techniques    PT Next Visit Plan  continue quad flexibilty; ISTAM to affected left ITB especially distal; e'stim; vasopnuematic; Iontophoresis; stationary bike.    PT Home Exercise Plan  prone quad stretch with strap    Consulted and Agree with Plan of Care  Patient       Patient will benefit from skilled therapeutic intervention in order to improve the following deficits and impairments:  Abnormal gait, Decreased activity tolerance, Decreased range of motion, Pain  Visit Diagnosis: Chronic pain of left knee  Stiffness of left knee, not elsewhere classified     Problem List Patient Active Problem List   Diagnosis Date Noted  . OSA (obstructive sleep apnea) 03/25/2014  . OA (osteoarthritis) of knee 10/22/2013  . Obesity, unspecified 07/02/2013  . Arthritis 07/02/2013  . Mouth dryness 07/02/2013  . Benign paroxysmal positional vertigo 07/02/2013  . Unspecified vitamin D deficiency 07/02/2013  .  Hypertension 05/04/2013  . Hyperlipidemia 05/04/2013    Standley Brooking, PTA 06/30/2018, 8:32 AM  [facility] [address] [place], [place], [address] Phone: [phone]   Fax:  [phone]  Name: Bethany [name] MRN: [id number] Date of Birth: 1951/06/19

## 2018-07-03 ENCOUNTER — Encounter: Payer: Self-pay | Admitting: Physical Therapy

## 2018-07-03 ENCOUNTER — Ambulatory Visit: Payer: Medicare Other | Admitting: Physical Therapy

## 2018-07-03 DIAGNOSIS — G8929 Other chronic pain: Secondary | ICD-10-CM

## 2018-07-03 DIAGNOSIS — M25562 Pain in left knee: Principal | ICD-10-CM

## 2018-07-03 DIAGNOSIS — M25662 Stiffness of left knee, not elsewhere classified: Secondary | ICD-10-CM

## 2018-07-03 NOTE — Therapy (Signed)
[facility] [place], [place], [address] Phone: [phone]   Fax:  [phone]  Physical Therapy Treatment  Patient Details  Name: Bethany [name] MRN: [id number] Date of Birth: 02/28/1951 Referring Provider: Gerrit Halls PA-C.   Encounter Date: 07/03/2018  PT End of Session - 07/03/18 4742    Visit Number  10    Number of Visits  12    Date for PT Re-Evaluation  07/06/18    Authorization Type  FOTO AT LEAST EVERY 5TH VISIT, 10TH VISIT PROGRESS NOTE AND KX MODIFIER AFTER THE 15 VISIT.    PT Start Time  [phone]    PT Stop Time  0906    PT Time Calculation (min)  50 min    Activity Tolerance  Patient tolerated treatment well    Behavior During Therapy  [facility] for tasks assessed/performed       Past Medical History:  Diagnosis Date  . Allergy 1997   knees  . Arthritis   . GERD (gastroesophageal reflux disease)   . Hyperlipidemia   . Hypertension   . Obesity   . Pneumonia    1 month ago - rsolved  . Seasonal allergies   . Sleep apnea    cpap    Past Surgical History:  Procedure Laterality Date  . ABDOMINAL HYSTERECTOMY    . BREAST SURGERY     fibroid cysts removed  . GANGLION CYST EXCISION     left  . phlebectomy    . TOTAL KNEE ARTHROPLASTY Right 10/22/2013   Procedure: RIGHT TOTAL KNEE ARTHROPLASTY;  Surgeon: Gearlean Alf, MD;  Location: [facility];  Service: Orthopedics;  Laterality: Right;  . TOTAL KNEE ARTHROPLASTY Left 06/11/2015   Procedure: LEFT TOTAL KNEE ARTHROPLASTY;  Surgeon: Gaynelle Arabian, MD;  Location: [facility];  Service: Orthopedics;  Laterality: Left;  . TUBAL LIGATION      There were no vitals filed for this visit.  Subjective Assessment - 07/03/18 0824    Subjective  Reports that she still has a little touch of pain as she has reported before.    Pertinent History  Bilateral TKA's.    Patient Stated Goals  Get out of pain.    Currently in Pain?  Yes    Pain Score  -- "1/2"    Pain Location   Knee    Pain Orientation  Left    Pain Descriptors / Indicators  Discomfort    Pain Type  Chronic pain    Pain Onset  More than a month ago    Pain Frequency  Constant         [facility] PT Assessment - 07/03/18 0001      Assessment   Medical Diagnosis  Left knee pain.    Onset Date/Surgical Date  05/13/18    Next MD Visit  07/14/2018      Restrictions   Weight Bearing Restrictions  No                   [facility] Adult PT Treatment/Exercise - 07/03/18 0001      Knee/Hip Exercises: Aerobic   Recumbent Bike  L5 x17 min      Knee/Hip Exercises: Standing   Terminal Knee Extension  Strengthening;Left;3 sets;10 reps;Theraband    Theraband Level (Terminal Knee Extension)  Level 3 (Green)    Hip Abduction  AROM;Left;3 sets;10 reps;Knee straight    Forward Step Up  Left;2 sets;10 reps;Hand Hold: 2;Step Height: 6" more L heel strike to ascend  secondary to pain      Knee/Hip Exercises: Supine   Bridges with Diona Foley Squeeze  Strengthening;Both;20 reps    Straight Leg Raises  AROM;Left;2 sets;10 reps    Straight Leg Raise with External Rotation  AROM;Left;2 sets;10 reps      Modalities   Modalities  Psychologist, educational Location  L knee    Electrical Stimulation Action  IFC    Electrical Stimulation Parameters  80-150 hz x15 min    Electrical Stimulation Goals  Pain;Edema      Vasopneumatic   Number Minutes Vasopneumatic   15 minutes    Vasopnuematic Location   Knee    Vasopneumatic Pressure  Low    Vasopneumatic Temperature   34               PT Short Term Goals - 06/08/18 1341      PT SHORT TERM GOAL #1   Title  STG's=LTG's.        PT Long Term Goals - 07/03/18 0855      PT LONG TERM GOAL #1   Title  Independent with an HEP.    Time  4    Period  Weeks    Status  Achieved      PT LONG TERM GOAL #2   Title  Sit to stand x 5 with left knee pain not > 2-3/10.    Time  4    Period   Weeks    Status  Achieved 1/10 pain 07/03/2018      PT LONG TERM GOAL #3   Title  Perform ADL's with pain not > 2-3/10.    Time  4    Period  Weeks    Status  Achieved 2/10 06/19/18            Plan - 07/03/18 0856    Clinical Impression Statement  Patient tolerated today's treatment well as she continuees to report minimal but constant L knee pain. Patient guided through LE strengthening without any increased pain. Patient able to tolerate forward step ups with L heel strike first compared to foot flat step up secondary to increased pain noted with foot flat. Patient able to complete sit to stands 5x with only 1/10 L knee pain. Normal modalities response noted following removal of the modalities.    Rehab Potential  Excellent    PT Frequency  3x / week    PT Duration  4 weeks    PT Treatment/Interventions  ADLs/Self Care Home Management;Cryotherapy;Electrical Stimulation;Iontophoresis 59m/ml Dexamethasone;Moist Heat;Therapeutic exercise;Therapeutic activities;Patient/family education;Passive range of motion;Manual techniques    PT Next Visit Plan  continue quad flexibilty; ISTAM to affected left ITB especially distal; e'stim; vasopnuematic; Iontophoresis; stationary bike.    PT Home Exercise Plan  prone quad stretch with strap    Consulted and Agree with Plan of Care  Patient       Patient will benefit from skilled therapeutic intervention in order to improve the following deficits and impairments:  Abnormal gait, Decreased activity tolerance, Decreased range of motion, Pain  Visit Diagnosis: Chronic pain of left knee  Stiffness of left knee, not elsewhere classified     Problem List Patient Active Problem List   Diagnosis Date Noted  . OSA (obstructive sleep apnea) 03/25/2014  . OA (osteoarthritis) of knee 10/22/2013  . Obesity, unspecified 07/02/2013  . Arthritis 07/02/2013  . Mouth dryness 07/02/2013  . Benign paroxysmal positional vertigo 07/02/2013  .  Unspecified  vitamin D deficiency 07/02/2013  . Hypertension 05/04/2013  . Hyperlipidemia 05/04/2013    Standley Brooking, PTA 07/03/2018, 9:19 AM  [facility] [address] [place], [place], [address] Phone: [phone]   Fax:  [phone]  Name: Newell Frater MRN: [id number] Date of Birth: December 22, 1951  Progress Note Reporting Period 06/08/18 to 07/03/18  See note below for Objective Data and Assessment of Progress/Goals.  Excellent progress.  Current LTG's met.  Mali Applegate MPT

## 2018-07-05 DIAGNOSIS — M9901 Segmental and somatic dysfunction of cervical region: Secondary | ICD-10-CM | POA: Diagnosis not present

## 2018-07-05 DIAGNOSIS — M5412 Radiculopathy, cervical region: Secondary | ICD-10-CM | POA: Diagnosis not present

## 2018-07-05 DIAGNOSIS — M9902 Segmental and somatic dysfunction of thoracic region: Secondary | ICD-10-CM | POA: Diagnosis not present

## 2018-07-05 DIAGNOSIS — M9903 Segmental and somatic dysfunction of lumbar region: Secondary | ICD-10-CM | POA: Diagnosis not present

## 2018-07-06 ENCOUNTER — Encounter: Payer: Self-pay | Admitting: Physical Therapy

## 2018-07-06 ENCOUNTER — Ambulatory Visit: Payer: Medicare Other | Admitting: Physical Therapy

## 2018-07-06 DIAGNOSIS — M25662 Stiffness of left knee, not elsewhere classified: Secondary | ICD-10-CM | POA: Diagnosis not present

## 2018-07-06 DIAGNOSIS — M25562 Pain in left knee: Secondary | ICD-10-CM | POA: Diagnosis not present

## 2018-07-06 DIAGNOSIS — G8929 Other chronic pain: Secondary | ICD-10-CM

## 2018-07-06 NOTE — Therapy (Signed)
[facility] [place], [place], [address] Phone: [phone]   Fax:  [phone]  Physical Therapy Treatment  Patient Details  Name: Bethany [name] MRN: [id number] Date of Birth: [birth year] Referring Provider: Gerrit Halls PA-C.   Encounter Date: 07/06/2018  PT End of Session - 07/06/18 0807    Visit Number  11    Number of Visits  12    Date for PT Re-Evaluation  07/06/18    Authorization Type  FOTO AT LEAST EVERY 5TH VISIT, 10TH VISIT PROGRESS NOTE AND KX MODIFIER AFTER THE 15 VISIT.    PT Start Time  [phone]    PT Stop Time  0827    PT Time Calculation (min)  51 min    Activity Tolerance  Patient tolerated treatment well    Behavior During Therapy  [facility] for tasks assessed/performed       Past Medical History:  Diagnosis Date  . Allergy 1997   knees  . Arthritis   . GERD (gastroesophageal reflux disease)   . Hyperlipidemia   . Hypertension   . Obesity   . Pneumonia    1 month ago - rsolved  . Seasonal allergies   . Sleep apnea    cpap    Past Surgical History:  Procedure Laterality Date  . ABDOMINAL HYSTERECTOMY    . BREAST SURGERY     fibroid cysts removed  . GANGLION CYST EXCISION     left  . phlebectomy    . TOTAL KNEE ARTHROPLASTY Right 10/22/2013   Procedure: RIGHT TOTAL KNEE ARTHROPLASTY;  Surgeon: Gearlean Alf, MD;  Location: [facility];  Service: Orthopedics;  Laterality: Right;  . TOTAL KNEE ARTHROPLASTY Left 06/11/2015   Procedure: LEFT TOTAL KNEE ARTHROPLASTY;  Surgeon: Gaynelle Arabian, MD;  Location: [facility];  Service: Orthopedics;  Laterality: Left;  . TUBAL LIGATION      There were no vitals filed for this visit.  Subjective Assessment - 07/06/18 0745    Subjective  Reports that her knee does okay with exercises in PT and at rec center. She just cannot do LAQ type exercises per patient report.    Pertinent History  Bilateral TKA's.    Patient Stated Goals  Get out of pain.    Currently in  Pain?  Yes    Pain Score  -- "1/4"    Pain Location  Knee    Pain Orientation  Left    Pain Descriptors / Indicators  Discomfort    Pain Type  Chronic pain    Pain Onset  More than a month ago    Pain Frequency  Intermittent    Aggravating Factors   LAQ, stairs         [facility] PT Assessment - 07/06/18 0001      Assessment   Medical Diagnosis  Left knee pain.    Onset Date/Surgical Date  05/13/18    Next MD Visit  07/14/2018      Restrictions   Weight Bearing Restrictions  No                   [facility] Adult PT Treatment/Exercise - 07/06/18 0001      Knee/Hip Exercises: Aerobic   Recumbent Bike  L5 x20 min      Knee/Hip Exercises: Standing   Terminal Knee Extension  Strengthening;Left;3 sets;10 reps;Theraband    Theraband Level (Terminal Knee Extension)  Level 3 (Green)    Forward Step Up  Left;2 sets;10 reps;Hand Hold: 2;Step  Height: 6"    Step Down  Left;2 sets;10 reps;Hand Hold: 2;Step Height: 2" Heel dot      Knee/Hip Exercises: Supine   Short Arc Quad Sets  AROM;Left;2 sets;10 reps    Straight Leg Raises  AROM;Left;2 sets;10 reps      Modalities   Modalities  Electrical Stimulation;Vasopneumatic per patient request      Acupuncturist Location  L knee    Electrical Stimulation Action  IFC    Electrical Stimulation Parameters  80-150 hz x15 min    Electrical Stimulation Goals  Pain;Edema      Vasopneumatic   Number Minutes Vasopneumatic   15 minutes    Vasopnuematic Location   Knee    Vasopneumatic Pressure  Low    Vasopneumatic Temperature   34               PT Short Term Goals - 06/08/18 1341      PT SHORT TERM GOAL #1   Title  STG's=LTG's.        PT Long Term Goals - 07/03/18 0855      PT LONG TERM GOAL #1   Title  Independent with an HEP.    Time  4    Period  Weeks    Status  Achieved      PT LONG TERM GOAL #2   Title  Sit to stand x 5 with left knee pain not > 2-3/10.    Time  4     Period  Weeks    Status  Achieved 1/10 pain 07/03/2018      PT LONG TERM GOAL #3   Title  Perform ADL's with pain not > 2-3/10.    Time  4    Period  Weeks    Status  Achieved 2/10 06/19/18            Plan - 07/06/18 0820    Clinical Impression Statement  Patient tolerated today's treatment well as she reports very minimal L knee pain. Pain still presents intermittantly with squatting (requires more time), LAQ like exercises and stairs. Patient able to complete all gentle exercises today well with no complaints of pain. 11th visit FOTO limitation 23%. Normal modalities response noted following removal of the modalities.    Rehab Potential  Excellent    PT Frequency  3x / week    PT Duration  4 weeks    PT Treatment/Interventions  ADLs/Self Care Home Management;Cryotherapy;Electrical Stimulation;Iontophoresis 4mg /ml Dexamethasone;Moist Heat;Therapeutic exercise;Therapeutic activities;Patient/family education;Passive range of motion;Manual techniques    PT Next Visit Plan  D/C next treatment.    PT Home Exercise Plan  prone quad stretch with strap    Consulted and Agree with Plan of Care  Patient       Patient will benefit from skilled therapeutic intervention in order to improve the following deficits and impairments:  Abnormal gait, Decreased activity tolerance, Decreased range of motion, Pain  Visit Diagnosis: Chronic pain of left knee  Stiffness of left knee, not elsewhere classified     Problem List Patient Active Problem List   Diagnosis Date Noted  . OSA (obstructive sleep apnea) 03/25/2014  . OA (osteoarthritis) of knee 10/22/2013  . Obesity, unspecified 07/02/2013  . Arthritis 07/02/2013  . Mouth dryness 07/02/2013  . Benign paroxysmal positional vertigo 07/02/2013  . Unspecified vitamin D deficiency 07/02/2013  . Hypertension 05/04/2013  . Hyperlipidemia 05/04/2013    Standley Brooking, PTA 07/06/2018, 8:30 AM  [facility] [place], [place], [address] Phone: [phone]   Fax:  [phone]  Name: Bethany [name] MRN: [id number] Date of Birth: 1951/01/21

## 2018-07-10 ENCOUNTER — Encounter: Payer: Self-pay | Admitting: Physical Therapy

## 2018-07-10 ENCOUNTER — Ambulatory Visit: Payer: Medicare Other | Admitting: Physical Therapy

## 2018-07-10 DIAGNOSIS — G8929 Other chronic pain: Secondary | ICD-10-CM

## 2018-07-10 DIAGNOSIS — M25562 Pain in left knee: Principal | ICD-10-CM

## 2018-07-10 DIAGNOSIS — M25662 Stiffness of left knee, not elsewhere classified: Secondary | ICD-10-CM | POA: Diagnosis not present

## 2018-07-10 NOTE — Therapy (Signed)
[facility] [place], [place], [address] Phone: [phone]   Fax:  [phone]  Physical Therapy Treatment/Discharge  Patient Details  Name: Bethany [name] MRN: [id number] Date of Birth: 23-Dec-1951 Referring Provider: Gerrit Halls PA-C.   Encounter Date: 07/10/2018  PT End of Session - 07/10/18 0745    Visit Number  12    Number of Visits  12    Date for PT Re-Evaluation  07/06/18    Authorization Type  FOTO AT LEAST EVERY 5TH VISIT, 10TH VISIT PROGRESS NOTE AND KX MODIFIER AFTER THE 15 VISIT.    PT Start Time  0731    PT Stop Time  0822    PT Time Calculation (min)  51 min    Activity Tolerance  Patient tolerated treatment well    Behavior During Therapy  [facility] for tasks assessed/performed       Past Medical History:  Diagnosis Date  . Allergy 1997   knees  . Arthritis   . GERD (gastroesophageal reflux disease)   . Hyperlipidemia   . Hypertension   . Obesity   . Pneumonia    1 month ago - rsolved  . Seasonal allergies   . Sleep apnea    cpap    Past Surgical History:  Procedure Laterality Date  . ABDOMINAL HYSTERECTOMY    . BREAST SURGERY     fibroid cysts removed  . GANGLION CYST EXCISION     left  . phlebectomy    . TOTAL KNEE ARTHROPLASTY Right 10/22/2013   Procedure: RIGHT TOTAL KNEE ARTHROPLASTY;  Surgeon: Gearlean Alf, MD;  Location: [facility];  Service: Orthopedics;  Laterality: Right;  . TOTAL KNEE ARTHROPLASTY Left 06/11/2015   Procedure: LEFT TOTAL KNEE ARTHROPLASTY;  Surgeon: Gaynelle Arabian, MD;  Location: [facility];  Service: Orthopedics;  Laterality: Left;  . TUBAL LIGATION      There were no vitals filed for this visit.  Subjective Assessment - 07/10/18 0744    Subjective  Reports that her knee is still doing well.    Pertinent History  Bilateral TKA's.    Patient Stated Goals  Get out of pain.    Currently in Pain?  Yes    Pain Score  -- "1/2"    Pain Location  Knee    Pain  Orientation  Left    Pain Descriptors / Indicators  Other (Comment) "twinge"    Pain Type  Chronic pain    Pain Onset  More than a month ago    Pain Frequency  Constant         [facility] PT Assessment - 07/10/18 0001      Assessment   Medical Diagnosis  Left knee pain.    Onset Date/Surgical Date  05/13/18    Next MD Visit  07/14/2018      Restrictions   Weight Bearing Restrictions  No                   [facility] Adult PT Treatment/Exercise - 07/10/18 0001      Knee/Hip Exercises: Aerobic   Recumbent Bike  L6 x16 min      Knee/Hip Exercises: Standing   Terminal Knee Extension  Strengthening;Left;3 sets;10 reps;Theraband    Theraband Level (Terminal Knee Extension)  Level 3 (Green)    Hip Abduction  AROM;Left;3 sets;10 reps;Knee straight    Forward Step Up  Left;3 sets;10 reps;Hand Hold: 2;Step Height: 6"    Step Down  Left;3 sets;10 reps;Hand Hold: 2;Step  Height: 4" as exercise progressed increased crepitus      Knee/Hip Exercises: Supine   Short Arc Quad Sets  Strengthening;Left;3 sets;10 reps;Limitations    Short Arc Quad Sets Limitations  3#    Straight Leg Raises  AROM;Left;3 sets;10 reps      Modalities   Modalities  Psychologist, educational Location  L knee    Electrical Stimulation Action  IFC    Electrical Stimulation Parameters  80-150 hz x15 min    Electrical Stimulation Goals  Pain;Edema      Vasopneumatic   Number Minutes Vasopneumatic   15 minutes    Vasopnuematic Location   Knee    Vasopneumatic Pressure  Medium    Vasopneumatic Temperature   34               PT Short Term Goals - 06/08/18 1341      PT SHORT TERM GOAL #1   Title  STG's=LTG's.        PT Long Term Goals - 07/03/18 0855      PT LONG TERM GOAL #1   Title  Independent with an HEP.    Time  4    Period  Weeks    Status  Achieved      PT LONG TERM GOAL #2   Title  Sit to stand x 5 with left knee  pain not > 2-3/10.    Time  4    Period  Weeks    Status  Achieved 1/10 pain 07/03/2018      PT LONG TERM GOAL #3   Title  Perform ADL's with pain not > 2-3/10.    Time  4    Period  Weeks    Status  Achieved 2/10 06/19/18            Plan - 07/10/18 0825    Clinical Impression Statement  Patient has progressed well with PT and has achieved all LTGs set at evaluation. Patient able to complete exercises but still limited by types of exercise such as LAQ due to pain. Patient has modified ascending stair gait for heel strike first to reduce pain. Patient has been able to tolerate L heel dot exercise to 2" step but was progressed to 4" step and as reps progressed patient began experiencing pain and crepitus that was audible. No other complaints throughout treatment. Normal modalities response noted following removal of the modalities.    Rehab Potential  Excellent    PT Frequency  3x / week    PT Duration  4 weeks    PT Treatment/Interventions  ADLs/Self Care Home Management;Cryotherapy;Electrical Stimulation;Iontophoresis 39m/ml Dexamethasone;Moist Heat;Therapeutic exercise;Therapeutic activities;Patient/family education;Passive range of motion;Manual techniques    PT Next Visit Plan  D/C summary required.    PT Home Exercise Plan  prone quad stretch with strap    Consulted and Agree with Plan of Care  Patient       Patient will benefit from skilled therapeutic intervention in order to improve the following deficits and impairments:  Abnormal gait, Decreased activity tolerance, Decreased range of motion, Pain  Visit Diagnosis: Chronic pain of left knee  Stiffness of left knee, not elsewhere classified     Problem List Patient Active Problem List   Diagnosis Date Noted  . OSA (obstructive sleep apnea) 03/25/2014  . OA (osteoarthritis) of knee 10/22/2013  . Obesity, unspecified 07/02/2013  . Arthritis 07/02/2013  . Mouth dryness 07/02/2013  .  Benign paroxysmal positional  vertigo 07/02/2013  . Unspecified vitamin D deficiency 07/02/2013  . Hypertension 05/04/2013  . Hyperlipidemia 05/04/2013    PHYSICAL THERAPY DISCHARGE SUMMARY  Visits from Start of Care: 12  Current functional level related to goals / functional outcomes: See above   Remaining deficits: All goals met   Education / Equipment: HEP   Plan: Patient agrees to discharge.  Patient goals were met. Patient is being discharged due to meeting the stated rehab goals.  ?????       Standley Brooking, PTA 07/10/2018, 8:39 AM  [facility] [address] [place], [place], [address] Phone: [phone]   Fax:  [phone]  Name: Bethany [name] MRN: [id number] Date of Birth: [birth year]

## 2018-07-13 ENCOUNTER — Encounter: Payer: Medicare Other | Admitting: Physical Therapy

## 2018-08-04 DIAGNOSIS — Z96652 Presence of left artificial knee joint: Secondary | ICD-10-CM | POA: Diagnosis not present

## 2018-08-04 DIAGNOSIS — R7303 Prediabetes: Secondary | ICD-10-CM | POA: Insufficient documentation

## 2018-08-04 HISTORY — DX: Prediabetes: R73.03

## 2018-08-15 DIAGNOSIS — M659 Synovitis and tenosynovitis, unspecified: Secondary | ICD-10-CM | POA: Diagnosis not present

## 2018-08-15 DIAGNOSIS — M25862 Other specified joint disorders, left knee: Secondary | ICD-10-CM | POA: Diagnosis not present

## 2018-08-15 DIAGNOSIS — Z96652 Presence of left artificial knee joint: Secondary | ICD-10-CM | POA: Diagnosis not present

## 2018-08-22 DIAGNOSIS — M5412 Radiculopathy, cervical region: Secondary | ICD-10-CM | POA: Diagnosis not present

## 2018-08-22 DIAGNOSIS — M9902 Segmental and somatic dysfunction of thoracic region: Secondary | ICD-10-CM | POA: Diagnosis not present

## 2018-08-22 DIAGNOSIS — M9901 Segmental and somatic dysfunction of cervical region: Secondary | ICD-10-CM | POA: Diagnosis not present

## 2018-08-22 DIAGNOSIS — M9903 Segmental and somatic dysfunction of lumbar region: Secondary | ICD-10-CM | POA: Diagnosis not present

## 2018-09-28 DIAGNOSIS — M5412 Radiculopathy, cervical region: Secondary | ICD-10-CM | POA: Diagnosis not present

## 2018-09-28 DIAGNOSIS — M9903 Segmental and somatic dysfunction of lumbar region: Secondary | ICD-10-CM | POA: Diagnosis not present

## 2018-09-28 DIAGNOSIS — M9902 Segmental and somatic dysfunction of thoracic region: Secondary | ICD-10-CM | POA: Diagnosis not present

## 2018-09-28 DIAGNOSIS — M9901 Segmental and somatic dysfunction of cervical region: Secondary | ICD-10-CM | POA: Diagnosis not present

## 2018-09-30 ENCOUNTER — Other Ambulatory Visit: Payer: Self-pay | Admitting: Family Medicine

## 2018-10-02 ENCOUNTER — Ambulatory Visit (INDEPENDENT_AMBULATORY_CARE_PROVIDER_SITE_OTHER): Payer: Medicare Other

## 2018-10-02 DIAGNOSIS — Z23 Encounter for immunization: Secondary | ICD-10-CM | POA: Diagnosis not present

## 2018-10-02 NOTE — Telephone Encounter (Signed)
Last seen 04/12/18  Dr Livia Snellen

## 2018-10-13 ENCOUNTER — Ambulatory Visit: Payer: Medicare Other | Admitting: Family Medicine

## 2018-10-16 ENCOUNTER — Encounter: Payer: Self-pay | Admitting: Family Medicine

## 2018-10-16 ENCOUNTER — Ambulatory Visit (INDEPENDENT_AMBULATORY_CARE_PROVIDER_SITE_OTHER): Payer: Medicare Other | Admitting: Family Medicine

## 2018-10-16 ENCOUNTER — Encounter (INDEPENDENT_AMBULATORY_CARE_PROVIDER_SITE_OTHER): Payer: Self-pay | Admitting: *Deleted

## 2018-10-16 VITALS — BP 134/87 | HR 73 | Temp 97.7°F | Ht 65.0 in | Wt 226.2 lb

## 2018-10-16 DIAGNOSIS — E782 Mixed hyperlipidemia: Secondary | ICD-10-CM | POA: Diagnosis not present

## 2018-10-16 DIAGNOSIS — I10 Essential (primary) hypertension: Secondary | ICD-10-CM | POA: Diagnosis not present

## 2018-10-16 DIAGNOSIS — R7303 Prediabetes: Secondary | ICD-10-CM

## 2018-10-16 DIAGNOSIS — Z1231 Encounter for screening mammogram for malignant neoplasm of breast: Secondary | ICD-10-CM | POA: Diagnosis not present

## 2018-10-16 DIAGNOSIS — M1990 Unspecified osteoarthritis, unspecified site: Secondary | ICD-10-CM | POA: Diagnosis not present

## 2018-10-16 DIAGNOSIS — Z1211 Encounter for screening for malignant neoplasm of colon: Secondary | ICD-10-CM

## 2018-10-16 DIAGNOSIS — E559 Vitamin D deficiency, unspecified: Secondary | ICD-10-CM

## 2018-10-16 LAB — BAYER DCA HB A1C WAIVED: HB A1C (BAYER DCA - WAIVED): 5.8 % (ref <7.0)

## 2018-10-16 MED ORDER — AMLODIPINE BESYLATE 5 MG PO TABS: 5.0000 mg | ORAL_TABLET | Freq: Every day | ORAL | 5 refills | Status: DC

## 2018-10-16 MED ORDER — MECLIZINE HCL 25 MG PO TABS: ORAL_TABLET | ORAL | 0 refills | Status: DC

## 2018-10-16 MED ORDER — PRAVASTATIN SODIUM 40 MG PO TABS: 40.0000 mg | ORAL_TABLET | Freq: Every morning | ORAL | 1 refills | Status: DC

## 2018-10-16 MED ORDER — RANITIDINE HCL 150 MG PO TABS: 150.0000 mg | ORAL_TABLET | Freq: Two times a day (BID) | ORAL | 3 refills | Status: DC

## 2018-10-16 MED ORDER — FUROSEMIDE 20 MG PO TABS: 20.0000 mg | ORAL_TABLET | ORAL | 1 refills | Status: DC

## 2018-10-16 MED ORDER — ACYCLOVIR 200 MG PO CAPS: ORAL_CAPSULE | ORAL | 3 refills | Status: DC

## 2018-10-16 MED ORDER — METFORMIN HCL ER 750 MG PO TB24: 750.0000 mg | ORAL_TABLET | Freq: Every day | ORAL | 5 refills | Status: DC

## 2018-10-16 MED ORDER — OLMESARTAN MEDOXOMIL 20 MG PO TABS: 20.0000 mg | ORAL_TABLET | Freq: Every day | ORAL | 0 refills | Status: DC

## 2018-10-16 NOTE — Progress Notes (Signed)
Subjective:  Patient ID: Bethany [name],  female    DOB: 11-01-1951  Age: 67 y.o.    CC: Medical Management of Chronic Issues   HPI Trynity Skousen presents for  follow-up of hypertension. Patient has no history of headache chest pain or shortness of breath or recent cough. Patient also denies symptoms of TIA such as numbness weakness lateralizing. Patient denies side effects from medication. States taking it regularly.  Patient also  in for follow-up of elevated cholesterol. Doing well without complaints on current medication. Denies side effects  including myalgia and arthralgia and nausea. Also in today for liver function testing. Currently no chest pain, shortness of breath or other cardiovascular related symptoms noted.  Follow-up of prediabetes. Patient denies symptoms such as excessive hunger or urinary frequency, excessive hunger, nausea No significant hypoglycemic spells noted. Exercising and dieting and has lost 9 lb. Medications reviewed. Pt reports taking metformin  regularly. Pt. denies complication/adverse reaction today.    History Charnetta has a past medical history of Allergy (1997), Arthritis, GERD (gastroesophageal reflux disease), Hyperlipidemia, Hypertension, Obesity, Pneumonia, Seasonal allergies, and Sleep apnea.   She has a past surgical history that includes Abdominal hysterectomy; Tubal ligation; phlebectomy; Total knee arthroplasty (Right, 10/22/2013); Ganglion cyst excision; Breast surgery; and Total knee arthroplasty (Left, 06/11/2015).   Her family history includes Arthritis in her brother; Diabetes in her sister; Heart disease in her mother and sister.She reports that she has never smoked. She has never used smokeless tobacco. She reports that she does not drink alcohol or use drugs.  Current Outpatient Medications on File Prior to Visit  Medication Sig Dispense Refill  . albuterol (PROVENTIL HFA;VENTOLIN HFA) 108 (90 Base) MCG/ACT inhaler Inhale 2 puffs into  the lungs every 6 (six) hours as needed for wheezing or shortness of breath. 1 Inhaler 0  . clotrimazole-betamethasone (LOTRISONE) cream Apply 1 application topically 2 (two) times daily. (Patient taking differently: Apply 1 application topically as needed. ) 60 g 0  . fluticasone (FLONASE) 50 MCG/ACT nasal spray Place 2 sprays into both nostrils as needed. 16 g 11  . NYSTATIN powder APPLY TOPICALLY FOUR TIMES DAILY 15 g 2   No current facility-administered medications on file prior to visit.     ROS Review of Systems  Constitutional: Negative.   HENT: Negative for congestion.   Eyes: Negative for visual disturbance.  Respiratory: Negative for shortness of breath.   Cardiovascular: Negative for chest pain.  Gastrointestinal: Negative for abdominal pain, constipation, diarrhea, nausea and vomiting.  Genitourinary: Negative for difficulty urinating.  Musculoskeletal: Negative for arthralgias and myalgias.  Neurological: Negative for headaches.  Psychiatric/Behavioral: Negative for sleep disturbance.    Objective:  BP 134/87   Pulse 73   Temp 97.7 F (36.5 C) (Oral)   Ht _0  (1.651 m)   Wt 226 lb 3.2 oz (102.6 kg)   BMI 37.64 kg/m   BP Readings from Last 3 Encounters:  10/16/18 134/87  04/12/18 126/86  03/03/18 135/89    Wt Readings from Last 3 Encounters:  10/16/18 226 lb 3.2 oz (102.6 kg)  04/12/18 234 lb 8 oz (106.4 kg)  03/03/18 226 lb (102.5 kg)     Physical Exam  Constitutional: She is oriented to person, place, and time. She appears well-developed and well-nourished. No distress.  HENT:  Head: Normocephalic and atraumatic.  Right Ear: External ear normal.  Left Ear: External ear normal.  Nose: Nose normal.  Mouth/Throat: Oropharynx is clear and moist.  Eyes: Pupils are equal,  round, and reactive to light. Conjunctivae and EOM are normal.  Neck: Normal range of motion. Neck supple. No thyromegaly present.  Cardiovascular: Normal rate, regular rhythm and  normal heart sounds.  No murmur heard. Pulmonary/Chest: Effort normal and breath sounds normal. No respiratory distress. She has no wheezes. She has no rales.  Abdominal: Soft. Bowel sounds are normal. She exhibits no distension. There is no tenderness.  Lymphadenopathy:    She has no cervical adenopathy.  Neurological: She is alert and oriented to person, place, and time. She has normal reflexes.  Skin: Skin is warm and dry.  Psychiatric: She has a normal mood and affect. Her behavior is normal. Judgment and thought content normal.    Diabetic Foot Exam - Simple   No data filed        Assessment & Plan:   Betzabe was seen today for medical management of chronic issues.  Diagnoses and all orders for this visit:  Essential hypertension -     CBC with Differential/Platelet -     CMP14+EGFR -     Bayer DCA Hb A1c Waived -     Microalbumin / creatinine urine ratio -     VITAMIN D 25 Hydroxy (Vit-D Deficiency, Fractures) -     TSH + free T4  Mixed hyperlipidemia -     CMP14+EGFR -     Lipid panel  Arthritis -     CBC with Differential/Platelet -     CMP14+EGFR -     Bayer DCA Hb A1c Waived  Prediabetes -     CBC with Differential/Platelet -     CMP14+EGFR -     Bayer DCA Hb A1c Waived -     Microalbumin / creatinine urine ratio -     VITAMIN D 25 Hydroxy (Vit-D Deficiency, Fractures) -     TSH + free T4  Encounter for screening mammogram for breast cancer -     MM Digital Screening; Future  Vitamin D deficiency -     VITAMIN D 25 Hydroxy (Vit-D Deficiency, Fractures)  Screen for colon cancer -     Ambulatory referral to Gastroenterology  Other orders -     ranitidine (ZANTAC) 150 MG tablet; Take 1 tablet (150 mg total) by mouth 2 (two) times daily. For heartburn -     pravastatin (PRAVACHOL) 40 MG tablet; Take 1 tablet (40 mg total) by mouth every morning. -     olmesartan (BENICAR) 20 MG tablet; Take 1 tablet (20 mg total) by mouth daily. -     metFORMIN  (GLUCOPHAGE-XR) 750 MG 24 hr tablet; Take 1 tablet (750 mg total) by mouth daily with breakfast. -     meclizine (ANTIVERT) 25 MG tablet; TAKE 1 TABLET THREE TIMES DAILY AS NEEDED  FOR  DIZZINESS -     furosemide (LASIX) 20 MG tablet; Take 1 tablet (20 mg total) by mouth every morning. -     amLODipine (NORVASC) 5 MG tablet; Take 1 tablet (5 mg total) by mouth daily. For blood pressure -     acyclovir (ZOVIRAX) 200 MG capsule; TAKE 1 CAPSULE EVERY DAY AS NEEDED FOR  FLAREUPS   I have changed Marchelle Kassabian's olmesartan. I am also having her maintain her clotrimazole-betamethasone, nystatin, albuterol, fluticasone, ranitidine, pravastatin, metFORMIN, meclizine, furosemide, amLODipine, and acyclovir.  Meds ordered this encounter  Medications  . ranitidine (ZANTAC) 150 MG tablet    Sig: Take 1 tablet (150 mg total) by mouth 2 (two) times daily. For heartburn  Dispense:  90 tablet    Refill:  3  . pravastatin (PRAVACHOL) 40 MG tablet    Sig: Take 1 tablet (40 mg total) by mouth every morning.    Dispense:  90 tablet    Refill:  1  . olmesartan (BENICAR) 20 MG tablet    Sig: Take 1 tablet (20 mg total) by mouth daily.    Dispense:  90 tablet    Refill:  0  . metFORMIN (GLUCOPHAGE-XR) 750 MG 24 hr tablet    Sig: Take 1 tablet (750 mg total) by mouth daily with breakfast.    Dispense:  30 tablet    Refill:  5  . meclizine (ANTIVERT) 25 MG tablet    Sig: TAKE 1 TABLET THREE TIMES DAILY AS NEEDED  FOR  DIZZINESS    Dispense:  270 tablet    Refill:  0  . furosemide (LASIX) 20 MG tablet    Sig: Take 1 tablet (20 mg total) by mouth every morning.    Dispense:  90 tablet    Refill:  1  . amLODipine (NORVASC) 5 MG tablet    Sig: Take 1 tablet (5 mg total) by mouth daily. For blood pressure    Dispense:  30 tablet    Refill:  5  . acyclovir (ZOVIRAX) 200 MG capsule    Sig: TAKE 1 CAPSULE EVERY DAY AS NEEDED FOR  FLAREUPS    Dispense:  90 capsule    Refill:  3   Encouraged pt. To  continue wt. Loss activities.  Follow-up: Return in about 6 months (around 04/17/2019).  Claretta Fraise, M.D.

## 2018-10-17 LAB — CMP14+EGFR
ALBUMIN: 4.4 g/dL (ref 3.6–4.8)
ALT: 9 IU/L (ref 0–32)
AST: 18 IU/L (ref 0–40)
Albumin/Globulin Ratio: 1.7 (ref 1.2–2.2)
Alkaline Phosphatase: 98 IU/L (ref 39–117)
BUN / CREAT RATIO: 10 — AB (ref 12–28)
BUN: 10 mg/dL (ref 8–27)
Bilirubin Total: 0.8 mg/dL (ref 0.0–1.2)
CALCIUM: 9.9 mg/dL (ref 8.7–10.3)
CO2: 26 mmol/L (ref 20–29)
Chloride: 105 mmol/L (ref 96–106)
Creatinine, Ser: 0.96 mg/dL (ref 0.57–1.00)
GFR calc Af Amer: 70 mL/min/{1.73_m2} (ref >59)
GFR, EST NON AFRICAN AMERICAN: 61 mL/min/{1.73_m2} (ref >59)
GLOBULIN, TOTAL: 2.6 g/dL (ref 1.5–4.5)
GLUCOSE: 87 mg/dL (ref 65–99)
POTASSIUM: 3.6 mmol/L (ref 3.5–5.2)
SODIUM: 145 mmol/L — AB (ref 134–144)
Total Protein: 7 g/dL (ref 6.0–8.5)

## 2018-10-17 LAB — LIPID PANEL
CHOL/HDL RATIO: 3.6 ratio (ref 0.0–4.4)
Cholesterol, Total: 157 mg/dL (ref 100–199)
HDL: 44 mg/dL (ref >39)
LDL CALC: 90 mg/dL (ref 0–99)
TRIGLYCERIDES: 116 mg/dL (ref 0–149)
VLDL Cholesterol Cal: 23 mg/dL (ref 5–40)

## 2018-10-17 LAB — CBC WITH DIFFERENTIAL/PLATELET
BASOS ABS: 0.1 10*3/uL (ref 0.0–0.2)
BASOS: 1 %
EOS (ABSOLUTE): 0.2 10*3/uL (ref 0.0–0.4)
Eos: 2 %
Hematocrit: 40.4 % (ref 34.0–46.6)
Hemoglobin: 13 g/dL (ref 11.1–15.9)
IMMATURE GRANULOCYTES: 1 %
Immature Grans (Abs): 0.1 10*3/uL (ref 0.0–0.1)
Lymphocytes Absolute: 2.7 10*3/uL (ref 0.7–3.1)
Lymphs: 26 %
MCH: 28.4 pg (ref 26.6–33.0)
MCHC: 32.2 g/dL (ref 31.5–35.7)
MCV: 88 fL (ref 79–97)
MONOS ABS: 0.8 10*3/uL (ref 0.1–0.9)
Monocytes: 7 %
NEUTROS PCT: 63 %
Neutrophils Absolute: 6.6 10*3/uL (ref 1.4–7.0)
PLATELETS: 297 10*3/uL (ref 150–450)
RBC: 4.57 x10E6/uL (ref 3.77–5.28)
RDW: 13.9 % (ref 12.3–15.4)
WBC: 10.3 10*3/uL (ref 3.4–10.8)

## 2018-10-17 LAB — TSH+FREE T4
FREE T4: 1.08 ng/dL (ref 0.82–1.77)
TSH: 3.1 u[IU]/mL (ref 0.450–4.500)

## 2018-10-17 LAB — MICROALBUMIN / CREATININE URINE RATIO
CREATININE, UR: 133.5 mg/dL
Microalb/Creat Ratio: 58.1 mg/g creat — ABNORMAL HIGH (ref 0.0–30.0)
Microalbumin, Urine: 77.6 ug/mL

## 2018-10-17 LAB — VITAMIN D 25 HYDROXY (VIT D DEFICIENCY, FRACTURES): VIT D 25 HYDROXY: 35 ng/mL (ref 30.0–100.0)

## 2018-10-17 NOTE — Progress Notes (Signed)
Hello Melda,  Your lab result is normal.Some minor variations that are not significant are commonly marked abnormal, but do not represent any medical problem for you.  Best regards, Claretta Fraise, M.D.

## 2018-11-14 ENCOUNTER — Encounter: Payer: Self-pay | Admitting: Family Medicine

## 2018-11-14 ENCOUNTER — Ambulatory Visit (INDEPENDENT_AMBULATORY_CARE_PROVIDER_SITE_OTHER): Payer: Medicare Other | Admitting: Family Medicine

## 2018-11-14 VITALS — BP 137/95 | HR 92 | Temp 99.7°F | Ht 65.0 in | Wt 225.0 lb

## 2018-11-14 DIAGNOSIS — R059 Cough, unspecified: Secondary | ICD-10-CM

## 2018-11-14 DIAGNOSIS — J019 Acute sinusitis, unspecified: Secondary | ICD-10-CM | POA: Diagnosis not present

## 2018-11-14 DIAGNOSIS — B9689 Other specified bacterial agents as the cause of diseases classified elsewhere: Secondary | ICD-10-CM | POA: Diagnosis not present

## 2018-11-14 DIAGNOSIS — R05 Cough: Secondary | ICD-10-CM | POA: Diagnosis not present

## 2018-11-14 MED ORDER — GUAIFENESIN-CODEINE 100-10 MG/5ML PO SOLN: 5.0000 mL | Freq: Four times a day (QID) | ORAL | 0 refills | Status: DC | PRN

## 2018-11-14 MED ORDER — CEFDINIR 300 MG PO CAPS: 300.0000 mg | ORAL_CAPSULE | Freq: Two times a day (BID) | ORAL | 0 refills | Status: DC

## 2018-11-14 NOTE — Progress Notes (Signed)
Subjective: CC: cough PCP: Claretta Fraise, MD RJJ:OACZYSA Bethany [name] is a 67 y.o. female presenting to clinic today for:  1. Cough Patient with a 7-day history of productive cough, chest congestion, sinus congestion and scratchy throat.  She reports associated fatigue, subjective fevers at night and chills.  She also reports dyspnea on exertion and waking up in the melanite gasping for breath.  She notes that she does have history of OSA but has not been using CPAP because she is awaiting the mask.  No hemoptysis.  She has been using Coricidin, Zicam and OTC lozenges.  She is also been using albuterol 2-3 times per day, which she does state helps but does not last long enough.  She thinks she has a history of asthma in the past.   ROS: Per HPI  Allergies  Allergen Reactions  . Hctz [Hydrochlorothiazide]     Hair loss   Past Medical History:  Diagnosis Date  . Allergy 1997   knees  . Arthritis   . GERD (gastroesophageal reflux disease)   . Hyperlipidemia   . Hypertension   . Obesity   . Pneumonia    1 month ago - rsolved  . Seasonal allergies   . Sleep apnea    cpap    Current Outpatient Medications:  .  acyclovir (ZOVIRAX) 200 MG capsule, TAKE 1 CAPSULE EVERY DAY AS NEEDED FOR  FLAREUPS, Disp: 90 capsule, Rfl: 3 .  albuterol (PROVENTIL HFA;VENTOLIN HFA) 108 (90 Base) MCG/ACT inhaler, Inhale 2 puffs into the lungs every 6 (six) hours as needed for wheezing or shortness of breath., Disp: 1 Inhaler, Rfl: 0 .  amLODipine (NORVASC) 5 MG tablet, Take 1 tablet (5 mg total) by mouth daily. For blood pressure, Disp: 30 tablet, Rfl: 5 .  clotrimazole-betamethasone (LOTRISONE) cream, Apply 1 application topically 2 (two) times daily. (Patient taking differently: Apply 1 application topically as needed. ), Disp: 60 g, Rfl: 0 .  fluticasone (FLONASE) 50 MCG/ACT nasal spray, Place 2 sprays into both nostrils as needed., Disp: 16 g, Rfl: 11 .  furosemide (LASIX) 20 MG tablet, Take 1 tablet (20  mg total) by mouth every morning., Disp: 90 tablet, Rfl: 1 .  meclizine (ANTIVERT) 25 MG tablet, TAKE 1 TABLET THREE TIMES DAILY AS NEEDED  FOR  DIZZINESS, Disp: 270 tablet, Rfl: 0 .  metFORMIN (GLUCOPHAGE-XR) 750 MG 24 hr tablet, Take 1 tablet (750 mg total) by mouth daily with breakfast., Disp: 30 tablet, Rfl: 5 .  NYSTATIN powder, APPLY TOPICALLY FOUR TIMES DAILY, Disp: 15 g, Rfl: 2 .  olmesartan (BENICAR) 20 MG tablet, Take 1 tablet (20 mg total) by mouth daily., Disp: 90 tablet, Rfl: 0 .  pravastatin (PRAVACHOL) 40 MG tablet, Take 1 tablet (40 mg total) by mouth every morning., Disp: 90 tablet, Rfl: 1 .  ranitidine (ZANTAC) 150 MG tablet, Take 1 tablet (150 mg total) by mouth 2 (two) times daily. For heartburn, Disp: 90 tablet, Rfl: 3 Social History   Socioeconomic History  . Marital status: Married    Spouse name: Not on file  . Number of children: Not on file  . Years of education: Not on file  . Highest education level: Not on file  Occupational History  . Occupation: house wife  Social Needs  . Financial resource strain: Not on file  . Food insecurity:    Worry: Not on file    Inability: Not on file  . Transportation needs:    Medical: Not on file  Non-medical: Not on file  Tobacco Use  . Smoking status: Never Smoker  . Smokeless tobacco: Never Used  Substance and Sexual Activity  . Alcohol use: No  . Drug use: No  . Sexual activity: Not Currently    Comment: husband not able to have  sex  Lifestyle  . Physical activity:    Days per week: Not on file    Minutes per session: Not on file  . Stress: Not on file  Relationships  . Social connections:    Talks on phone: Not on file    Gets together: Not on file    Attends religious service: Not on file    Active member of club or organization: Not on file    Attends meetings of clubs or organizations: Not on file    Relationship status: Not on file  . Intimate partner violence:    Fear of current or ex partner: Not  on file    Emotionally abused: Not on file    Physically abused: Not on file    Forced sexual activity: Not on file  Other Topics Concern  . Not on file  Social History Narrative  . Not on file   Family History  Problem Relation Age of Onset  . Heart disease Mother   . Diabetes Sister   . Arthritis Brother        knees  . Heart disease Sister        tumor in the heart    Objective: Office vital signs reviewed. BP (!) 137/95   Pulse 92   Temp 99.7 F (37.6 C) (Oral)   Ht 5\' 5"  (1.651 m)   Wt 225 lb (102.1 kg)   SpO2 98%   BMI 37.44 kg/m   Physical Examination:  General: Awake, alert, well nourished, tired appearing. No acute distress HEENT: Normal    Neck: No masses palpated. No lymphadenopathy    Ears: Tympanic membranes intact, normal light reflex, no erythema, no bulging    Eyes: PERRLA, extraocular membranes intact, sclera white    Nose: nasal turbinates moist, clear nasal discharge    Throat: moist mucus membranes, no erythema, no tonsillar exudate.  Airway is patent Cardio: regular rate and rhythm, S1S2 heard, no murmurs appreciated Pulm: clear to auscultation bilaterally, no wheezes, rhonchi or rales; normal work of breathing on room air   Assessment/ Plan: 67 y.o. female   1. Acute bacterial sinusitis Patient here with low-grade fever.  She is nontoxic-appearing.  Her history is concerning for acute bacterial sinusitis, particularly given worsening symptoms at night and overall worsening of symptoms.  I prescribed her Omnicef 300 mg to take twice daily for the next 10 days.  I have also prescribed her guaifenesin AC to take every 6 hours as needed for cough.  Caution sedation.  The national narcotic database was reviewed and there were no red flags.  Continue albuterol 2 puffs every 6 hours scheduled for the next 2 days and as needed as directed.  Home care instructions were reviewed with the patient, return precautions also discussed.  Handout provided.  Patient  was good understanding will follow-up PRN.  2. Cough As above.   No orders of the defined types were placed in this encounter.  Meds ordered this encounter  Medications  . cefdinir (OMNICEF) 300 MG capsule    Sig: Take 1 capsule (300 mg total) by mouth 2 (two) times daily. 1 po BID    Dispense:  20 capsule  Refill:  0  . guaiFENesin-codeine 100-10 MG/5ML syrup    Sig: Take 5 mLs by mouth every 6 (six) hours as needed for cough.    Dispense:  100 mL    Refill:  [place], DO [place] [phone]

## 2018-11-14 NOTE — Patient Instructions (Signed)
Remember the cough syrup can cause sleepiness. DO NOT drive while taking.  - Get plenty of rest and drink plenty of fluids. - Try to breathe moist air. Use a cold mist humidifier. - Consume warm fluids (soup or tea) to provide relief for a stuffy nose and to loosen phlegm. - For nasal stuffiness, try saline nasal spray or a Neti Pot. Afrin nasal spray can also be used but this product should not be used longer than 3 days or it will cause rebound nasal stuffiness (worsening nasal congestion). - For sore throat pain relief: use chloraseptic spray, suck on throat lozenges, hard candy or popsicles; gargle with warm salt water (1/4 tsp. salt per 8 oz. of water); and eat soft, bland foods. - Eat a well-balanced diet. If you cannot, ensure you are getting enough nutrients by taking a daily multivitamin. - Avoid dairy products, as they can thicken phlegm. - Avoid alcohol, as it impairs your body's immune system.  CONTACT YOUR DOCTOR IF YOU EXPERIENCE ANY OF THE FOLLOWING: - High fever - Ear pain - Sinus-type headache - Unusually severe cold symptoms - Cough that gets worse while other cold symptoms improve - Flare up of any chronic lung problem, such as asthma - Your symptoms persist longer than 2 weeks

## 2018-11-21 DIAGNOSIS — M5412 Radiculopathy, cervical region: Secondary | ICD-10-CM | POA: Diagnosis not present

## 2018-11-21 DIAGNOSIS — M9903 Segmental and somatic dysfunction of lumbar region: Secondary | ICD-10-CM | POA: Diagnosis not present

## 2018-11-21 DIAGNOSIS — M9902 Segmental and somatic dysfunction of thoracic region: Secondary | ICD-10-CM | POA: Diagnosis not present

## 2018-11-21 DIAGNOSIS — M9901 Segmental and somatic dysfunction of cervical region: Secondary | ICD-10-CM | POA: Diagnosis not present

## 2018-12-18 ENCOUNTER — Other Ambulatory Visit: Payer: Self-pay | Admitting: Family Medicine

## 2018-12-18 DIAGNOSIS — J189 Pneumonia, unspecified organism: Secondary | ICD-10-CM

## 2019-01-01 DIAGNOSIS — M9902 Segmental and somatic dysfunction of thoracic region: Secondary | ICD-10-CM | POA: Diagnosis not present

## 2019-01-01 DIAGNOSIS — M5412 Radiculopathy, cervical region: Secondary | ICD-10-CM | POA: Diagnosis not present

## 2019-01-01 DIAGNOSIS — M9901 Segmental and somatic dysfunction of cervical region: Secondary | ICD-10-CM | POA: Diagnosis not present

## 2019-01-01 DIAGNOSIS — M9903 Segmental and somatic dysfunction of lumbar region: Secondary | ICD-10-CM | POA: Diagnosis not present

## 2019-01-04 DIAGNOSIS — M5412 Radiculopathy, cervical region: Secondary | ICD-10-CM | POA: Diagnosis not present

## 2019-01-04 DIAGNOSIS — M9901 Segmental and somatic dysfunction of cervical region: Secondary | ICD-10-CM | POA: Diagnosis not present

## 2019-01-04 DIAGNOSIS — M9902 Segmental and somatic dysfunction of thoracic region: Secondary | ICD-10-CM | POA: Diagnosis not present

## 2019-01-04 DIAGNOSIS — M9903 Segmental and somatic dysfunction of lumbar region: Secondary | ICD-10-CM | POA: Diagnosis not present

## 2019-01-16 ENCOUNTER — Other Ambulatory Visit: Payer: Self-pay | Admitting: *Deleted

## 2019-01-16 MED ORDER — METFORMIN HCL ER 750 MG PO TB24: 750.0000 mg | ORAL_TABLET | Freq: Every day | ORAL | 0 refills | Status: DC

## 2019-02-15 DIAGNOSIS — M62830 Muscle spasm of back: Secondary | ICD-10-CM | POA: Diagnosis not present

## 2019-02-15 DIAGNOSIS — M9903 Segmental and somatic dysfunction of lumbar region: Secondary | ICD-10-CM | POA: Diagnosis not present

## 2019-02-15 DIAGNOSIS — M9904 Segmental and somatic dysfunction of sacral region: Secondary | ICD-10-CM | POA: Diagnosis not present

## 2019-02-15 DIAGNOSIS — M9905 Segmental and somatic dysfunction of pelvic region: Secondary | ICD-10-CM | POA: Diagnosis not present

## 2019-02-19 DIAGNOSIS — M62830 Muscle spasm of back: Secondary | ICD-10-CM | POA: Diagnosis not present

## 2019-02-19 DIAGNOSIS — M9905 Segmental and somatic dysfunction of pelvic region: Secondary | ICD-10-CM | POA: Diagnosis not present

## 2019-02-19 DIAGNOSIS — M9903 Segmental and somatic dysfunction of lumbar region: Secondary | ICD-10-CM | POA: Diagnosis not present

## 2019-02-19 DIAGNOSIS — M9904 Segmental and somatic dysfunction of sacral region: Secondary | ICD-10-CM | POA: Diagnosis not present

## 2019-03-26 DIAGNOSIS — M9903 Segmental and somatic dysfunction of lumbar region: Secondary | ICD-10-CM | POA: Diagnosis not present

## 2019-03-26 DIAGNOSIS — M62830 Muscle spasm of back: Secondary | ICD-10-CM | POA: Diagnosis not present

## 2019-03-26 DIAGNOSIS — M9905 Segmental and somatic dysfunction of pelvic region: Secondary | ICD-10-CM | POA: Diagnosis not present

## 2019-03-26 DIAGNOSIS — M9904 Segmental and somatic dysfunction of sacral region: Secondary | ICD-10-CM | POA: Diagnosis not present

## 2019-03-28 DIAGNOSIS — M62830 Muscle spasm of back: Secondary | ICD-10-CM | POA: Diagnosis not present

## 2019-03-28 DIAGNOSIS — M9903 Segmental and somatic dysfunction of lumbar region: Secondary | ICD-10-CM | POA: Diagnosis not present

## 2019-03-28 DIAGNOSIS — M9904 Segmental and somatic dysfunction of sacral region: Secondary | ICD-10-CM | POA: Diagnosis not present

## 2019-03-28 DIAGNOSIS — M9905 Segmental and somatic dysfunction of pelvic region: Secondary | ICD-10-CM | POA: Diagnosis not present

## 2019-03-29 DIAGNOSIS — M62830 Muscle spasm of back: Secondary | ICD-10-CM | POA: Diagnosis not present

## 2019-03-29 DIAGNOSIS — M9903 Segmental and somatic dysfunction of lumbar region: Secondary | ICD-10-CM | POA: Diagnosis not present

## 2019-03-29 DIAGNOSIS — M9904 Segmental and somatic dysfunction of sacral region: Secondary | ICD-10-CM | POA: Diagnosis not present

## 2019-03-29 DIAGNOSIS — M9905 Segmental and somatic dysfunction of pelvic region: Secondary | ICD-10-CM | POA: Diagnosis not present

## 2019-04-10 ENCOUNTER — Telehealth: Payer: Self-pay | Admitting: *Deleted

## 2019-04-10 NOTE — Telephone Encounter (Signed)
Patient states her metformin bottle says metformin HCL - she wants to make sure it does not have hydrocholorthiazide in it because she is allergic.  Advised that stands for metfomrin hydrocholoride - and it does not contain hydrochlorothiazide.

## 2019-04-12 DIAGNOSIS — M9904 Segmental and somatic dysfunction of sacral region: Secondary | ICD-10-CM | POA: Diagnosis not present

## 2019-04-12 DIAGNOSIS — M9903 Segmental and somatic dysfunction of lumbar region: Secondary | ICD-10-CM | POA: Diagnosis not present

## 2019-04-12 DIAGNOSIS — M62830 Muscle spasm of back: Secondary | ICD-10-CM | POA: Diagnosis not present

## 2019-04-12 DIAGNOSIS — M9905 Segmental and somatic dysfunction of pelvic region: Secondary | ICD-10-CM | POA: Diagnosis not present

## 2019-04-17 ENCOUNTER — Other Ambulatory Visit: Payer: Self-pay

## 2019-04-17 ENCOUNTER — Ambulatory Visit (INDEPENDENT_AMBULATORY_CARE_PROVIDER_SITE_OTHER): Payer: Medicare Other | Admitting: Family Medicine

## 2019-04-17 ENCOUNTER — Encounter: Payer: Self-pay | Admitting: Family Medicine

## 2019-04-17 DIAGNOSIS — R7303 Prediabetes: Secondary | ICD-10-CM

## 2019-04-17 DIAGNOSIS — I10 Essential (primary) hypertension: Secondary | ICD-10-CM

## 2019-04-17 DIAGNOSIS — B372 Candidiasis of skin and nail: Secondary | ICD-10-CM

## 2019-04-17 DIAGNOSIS — E782 Mixed hyperlipidemia: Secondary | ICD-10-CM

## 2019-04-17 DIAGNOSIS — M1990 Unspecified osteoarthritis, unspecified site: Secondary | ICD-10-CM | POA: Diagnosis not present

## 2019-04-17 MED ORDER — DICLOFENAC SODIUM 75 MG PO TBEC: 75.0000 mg | DELAYED_RELEASE_TABLET | Freq: Two times a day (BID) | ORAL | 2 refills | Status: DC

## 2019-04-17 MED ORDER — PRAVASTATIN SODIUM 40 MG PO TABS: 40.0000 mg | ORAL_TABLET | Freq: Every morning | ORAL | 1 refills | Status: DC

## 2019-04-17 MED ORDER — VENTOLIN HFA 108 (90 BASE) MCG/ACT IN AERS: INHALATION_SPRAY | RESPIRATORY_TRACT | 3 refills | Status: DC

## 2019-04-17 MED ORDER — MECLIZINE HCL 25 MG PO TABS: ORAL_TABLET | ORAL | 1 refills | Status: DC

## 2019-04-17 MED ORDER — METFORMIN HCL ER 750 MG PO TB24: 750.0000 mg | ORAL_TABLET | Freq: Every day | ORAL | 1 refills | Status: DC

## 2019-04-17 MED ORDER — OLMESARTAN MEDOXOMIL 20 MG PO TABS: 20.0000 mg | ORAL_TABLET | Freq: Every day | ORAL | 1 refills | Status: DC

## 2019-04-17 MED ORDER — FUROSEMIDE 20 MG PO TABS: 20.0000 mg | ORAL_TABLET | ORAL | 1 refills | Status: DC

## 2019-04-17 MED ORDER — AMLODIPINE BESYLATE 5 MG PO TABS: 5.0000 mg | ORAL_TABLET | Freq: Every day | ORAL | 5 refills | Status: DC

## 2019-04-17 MED ORDER — CLOTRIMAZOLE-BETAMETHASONE 1-0.05 % EX CREA: 1.0000 "application " | TOPICAL_CREAM | Freq: Two times a day (BID) | CUTANEOUS | 0 refills | Status: DC

## 2019-04-17 NOTE — Progress Notes (Signed)
Subjective:    Patient ID: Bethany [name], female    DOB: 1951/02/10, 68 y.o.   MRN: [id number]  CC: Hypertension and Osteoarthritis   HPI: Bethany [name] is a 68 y.o. female presenting for Hypertension and Osteoarthritis   Follow-up of hypertension. Patient has no history of headache chest pain or shortness of breath or recent cough. Patient also denies symptoms of TIA such as numbness weakness lateralizing. Patient checks  blood pressure at home and has not had any elevated readings recently. Patient denies side effects from his medication. States taking it regularly.  Patient in for follow-up of elevated cholesterol. Doing well without complaints on current medication. Denies side effects of statin including myalgia and arthralgia and nausea. Also in today for liver function testing. Currently no chest pain, shortness of breath or other cardiovascular related symptoms noted.  Obesity related diabetes.  She is classified as prediabetic.  She does take metformin regularly.  She does not check blood sugar at home.  No symptoms related to hypoglycemia.  A1c deferred today due to the sheltering orders for COVID virus.  Depression screen [facility] 2/9 11/14/2018 10/16/2018 04/12/2018 03/03/2018 12/12/2017  Decreased Interest 0 0 0 0 1  Down, Depressed, Hopeless 0 0 0 0 1  PHQ - 2 Score 0 0 0 0 2  Altered sleeping 0 - - - 1  Tired, decreased energy 0 - - - 1  Change in appetite 0 - - - 1  Feeling bad or failure about yourself  0 - - - 1  Trouble concentrating 0 - - - 1  Moving slowly or fidgety/restless 0 - - - 1  Suicidal thoughts - - - - 0  PHQ-9 Score 0 - - - 8  Difficult doing work/chores - - - - Not difficult at all     Relevant past medical, surgical, family and social history reviewed and updated as indicated.  Interim medical history since our last visit reviewed. Allergies and medications reviewed and updated.  ROS:  Review of Systems  Constitutional: Negative.   HENT: Negative  for congestion.   Eyes: Negative for visual disturbance.  Respiratory: Negative for shortness of breath.   Cardiovascular: Negative for chest pain.  Gastrointestinal: Negative for abdominal pain, constipation, diarrhea, nausea and vomiting.  Genitourinary: Negative for difficulty urinating.  Musculoskeletal: Positive for arthralgias. Negative for myalgias.  Neurological: Negative for headaches.  Psychiatric/Behavioral: Negative for sleep disturbance.     Social History   Tobacco Use  Smoking Status Never Smoker  Smokeless Tobacco Never Used       Objective:     Wt Readings from Last 3 Encounters:  11/14/18 225 lb (102.1 kg)  10/16/18 226 lb 3.2 oz (102.6 kg)  04/12/18 234 lb 8 oz (106.4 kg)     Exam deferred. Pt. Harboring due to COVID 19. Phone visit performed.   Assessment & Plan:    Bethany [name] was seen today for hypertension and osteoarthritis.  Diagnoses and all orders for this visit:  Essential hypertension  Candidiasis of skin -     clotrimazole-betamethasone (LOTRISONE) cream; Apply 1 application topically 2 (two) times daily.  Mixed hyperlipidemia  Arthritis  Prediabetes  Class 2 severe obesity due to excess calories with serious comorbidity in adult, unspecified BMI (HCC) -     VENTOLIN HFA 108 (90 Base) MCG/ACT inhaler; INHALE 2 PUFFS BY MOUTH EVERY 6 HOURS AS NEEDED FOR WHEEZING OR SHORTNESS OF BREATH  Other orders -     amLODipine (NORVASC) 5 MG  tablet; Take 1 tablet (5 mg total) by mouth daily. For blood pressure -     diclofenac (VOLTAREN) 75 MG EC tablet; Take 1 tablet (75 mg total) by mouth 2 (two) times daily. For muscle and  Joint pain -     furosemide (LASIX) 20 MG tablet; Take 1 tablet (20 mg total) by mouth every morning. -     meclizine (ANTIVERT) 25 MG tablet; TAKE 1 TABLET THREE TIMES DAILY AS NEEDED  FOR  DIZZINESS -     metFORMIN (GLUCOPHAGE-XR) 750 MG 24 hr tablet; Take 1 tablet (750 mg total) by mouth daily with breakfast. -      olmesartan (BENICAR) 20 MG tablet; Take 1 tablet (20 mg total) by mouth daily. -     pravastatin (PRAVACHOL) 40 MG tablet; Take 1 tablet (40 mg total) by mouth every morning.   Virtual Visit via telephone Note  I discussed the limitations, risks, security and privacy concerns of performing an evaluation and management service by telephone and the availability of in person appointments. The patient expressed understanding and agreed to proceed. Pt. Is at home. Dr. Livia Snellen is in his office.  Follow Up Instructions:   I discussed the assessment and treatment plan with the patient. The patient was provided an opportunity to ask questions and all were answered. The patient agreed with the plan and demonstrated an understanding of the instructions.   The patient was advised to call back or seek an in-person evaluation if the symptoms worsen or if the condition fails to improve as anticipated.  Visit started: 7:55 Call ended:  8:12  Total minutes including chart review and phone contact time: 22   Follow up plan: Return in about 3 months (around 07/17/2019).  Claretta Fraise, MD [place],

## 2019-05-30 ENCOUNTER — Telehealth: Payer: Self-pay | Admitting: Family Medicine

## 2019-06-05 ENCOUNTER — Encounter: Payer: Self-pay | Admitting: *Deleted

## 2019-06-05 ENCOUNTER — Other Ambulatory Visit: Payer: Self-pay

## 2019-06-05 ENCOUNTER — Ambulatory Visit (INDEPENDENT_AMBULATORY_CARE_PROVIDER_SITE_OTHER): Payer: Medicare Other | Admitting: *Deleted

## 2019-06-05 DIAGNOSIS — Z1211 Encounter for screening for malignant neoplasm of colon: Secondary | ICD-10-CM

## 2019-06-05 DIAGNOSIS — Z0000 Encounter for general adult medical examination without abnormal findings: Secondary | ICD-10-CM | POA: Diagnosis not present

## 2019-06-05 NOTE — Patient Instructions (Signed)
  Bethany [name] , Thank you for taking time to come for your Medicare Wellness Visit. I appreciate your ongoing commitment to your health goals. Please review the following plan we discussed and let me know if I can assist you in the future.   These are the goals we discussed: Goals                   . Plan meals     Once per week plan menu for the rest of week - include mostly healthy food options.             This is a list of the screening recommended for you and due dates:  Health Maintenance  Topic Date Due  . Colon Cancer Screening  03/12/2000  . Mammogram  04/19/2018  . Flu Shot  07/28/2019  . Tetanus Vaccine  09/03/2026  . DEXA scan (bone density measurement)  Completed  .  Hepatitis C: One time screening is recommended by Center for Disease Control  (CDC) for  adults born from [birth year] through 1965.   Completed  . Pneumonia vaccines  Completed

## 2019-06-05 NOTE — Progress Notes (Signed)
MEDICARE ANNUAL WELLNESS VISIT  06/05/2019  Telephone Visit Disclaimer This Medicare AWV was conducted by telephone due to national recommendations for restrictions regarding the COVID-19 Pandemic (e.g. social distancing).  I verified, using two identifiers, that I am speaking with Bethany [name] or their authorized healthcare agent. I discussed the limitations, risks, security, and privacy concerns of performing an evaluation and management service by telephone and the potential availability of an in-person appointment in the future. The patient expressed understanding and agreed to proceed.   Subjective:  Bethany [name] is a 68 y.o. female patient of Stacks, Cletus Gash, MD who had a Medicare Annual Wellness Visit today via telephone. Bethany [name] is retired from [employer], and lives with her husband. She has 4 children, 1 is deceased.  She has 3 grandchildren and 1 great grandchild. She reports that she is socially active and does interact with friends/family regularly. She is active in her church and attends regularly.  She is moderately physically active, walking 30 minutes per day 6 days per week.  Bethany [name] enjoys sewing.    Patient Care Team: Claretta Fraise, MD as PCP - General (Family Medicine) Gaynelle Arabian, MD as Consulting Physician (Orthopedic Surgery)  Advanced Directives 06/05/2019 06/08/2018 06/27/2015 06/25/2015 06/11/2015 06/11/2015 05/14/2015  Does Patient Have a Medical Advance Directive? Yes Yes No Yes Yes Yes Yes  Type of Paramedic of [place];Living will - - Healthcare Power of Attorney;Mental [place];Living will [place];Living will [place];Living will  Does patient want to make changes to medical advance directive? No - Patient declined - - - No - Patient declined - -  Copy of [place] in Chart? No - copy requested - - - Yes Yes No - copy requested   Pre-existing out of facility DNR order (yellow form or pink MOST form) - - - - - - -    Hospital Utilization Over the Past 12 Months: # of hospitalizations or ER visits: 0 # of surgeries: 0  Review of Systems    Patient reports that her overall health is unchanged compared to last year.    Review of Systems:   Musculoskeletal - right buttocks and leg pain  All other systems negative.  Pain Assessment Pain Score: 8      Current Medications & Allergies (verified) Allergies as of 06/05/2019      Reactions   Hctz [hydrochlorothiazide]    Hair loss      Medication List       Accurate as of June 05, 2019 10:06 AM. If you have any questions, ask your nurse or doctor.        acyclovir 200 MG capsule Commonly known as:  ZOVIRAX TAKE 1 CAPSULE EVERY DAY AS NEEDED FOR  FLAREUPS   amLODipine 5 MG tablet Commonly known as:  NORVASC Take 1 tablet (5 mg total) by mouth daily. For blood pressure   clotrimazole-betamethasone cream Commonly known as:  LOTRISONE Apply 1 application topically 2 (two) times daily.   diclofenac 75 MG EC tablet Commonly known as:  VOLTAREN Take 1 tablet (75 mg total) by mouth 2 (two) times daily. For muscle and  Joint pain   fluticasone 50 MCG/ACT nasal spray Commonly known as:  FLONASE Place 2 sprays into both nostrils as needed.   furosemide 20 MG tablet Commonly known as:  LASIX Take 1 tablet (20 mg total) by mouth every morning.   meclizine 25 MG tablet  Commonly known as:  ANTIVERT TAKE 1 TABLET THREE TIMES DAILY AS NEEDED  FOR  DIZZINESS   metFORMIN 750 MG 24 hr tablet Commonly known as:  GLUCOPHAGE-XR Take 1 tablet (750 mg total) by mouth daily with breakfast.   olmesartan 20 MG tablet Commonly known as:  BENICAR Take 1 tablet (20 mg total) by mouth daily.   pravastatin 40 MG tablet Commonly known as:  PRAVACHOL Take 1 tablet (40 mg total) by mouth every morning.   Ventolin HFA 108 (90 Base) MCG/ACT inhaler Generic drug:   albuterol INHALE 2 PUFFS BY MOUTH EVERY 6 HOURS AS NEEDED FOR WHEEZING OR SHORTNESS OF BREATH       History (reviewed): Past Medical History:  Diagnosis Date  . Allergy 1997   knees  . Arthritis   . GERD (gastroesophageal reflux disease)   . Hyperlipidemia   . Hypertension   . Obesity   . Pneumonia    1 month ago - rsolved  . Seasonal allergies   . Sleep apnea    cpap   Past Surgical History:  Procedure Laterality Date  . ABDOMINAL HYSTERECTOMY    . BREAST SURGERY     fibroid cysts removed  . GANGLION CYST EXCISION     left  . phlebectomy    . TOTAL KNEE ARTHROPLASTY Right 10/22/2013   Procedure: RIGHT TOTAL KNEE ARTHROPLASTY;  Surgeon: Gearlean Alf, MD;  Location: [facility];  Service: Orthopedics;  Laterality: Right;  . TOTAL KNEE ARTHROPLASTY Left 06/11/2015   Procedure: LEFT TOTAL KNEE ARTHROPLASTY;  Surgeon: Gaynelle Arabian, MD;  Location: [facility];  Service: Orthopedics;  Laterality: Left;  . TUBAL LIGATION     Family History  Problem Relation Age of Onset  . Heart disease Mother   . Early death Father        Shooting  . Diabetes Sister   . Arthritis Brother        knees  . Heart disease Sister        tumor in the heart   Social History   Socioeconomic History  . Marital status: Married    Spouse name: Not on file  . Number of children: Not on file  . Years of education: 61  . Highest education level: High school graduate  Occupational History  . Occupation: retired    Fish farm manager: [employer]  Social Needs  . Financial resource strain: Not hard at all  . Food insecurity:    Worry: Never true    Inability: Never true  . Transportation needs:    Medical: No    Non-medical: No  Tobacco Use  . Smoking status: Never Smoker  . Smokeless tobacco: Never Used  Substance and Sexual Activity  . Alcohol use: No  . Drug use: No  . Sexual activity: Not Currently    Comment: husband not able to have  sex  Lifestyle  . Physical activity:    Days per week: 6  days    Minutes per session: 30 min  . Stress: Only a little  Relationships  . Social connections:    Talks on phone: More than three times a week    Gets together: More than three times a week    Attends religious service: More than 4 times per year    Active member of club or organization: No    Attends meetings of clubs or organizations: Never    Relationship status: Married  Other Topics Concern  . Not on file  Social  History Narrative  . Not on file    Activities of Daily Living In your present state of health, do you have any difficulty performing the following activities: 06/05/2019  Hearing? N  Vision? N  Difficulty concentrating or making decisions? N  Walking or climbing stairs? N  Dressing or bathing? N  Doing errands, shopping? N  Preparing Food and eating ? N  Using the Toilet? N  In the past six months, have you accidently leaked urine? N  Do you have problems with loss of bowel control? N  Managing your Medications? N  Managing your Finances? N  Housekeeping or managing your Housekeeping? N  Some recent data might be hidden    Patient Literacy    Exercise Current Exercise Habits: Home exercise routine, Type of exercise: walking, Time (Minutes): 30, Frequency (Times/Week): 5, Weekly Exercise (Minutes/Week): 150, Intensity: Moderate, Exercise limited by: orthopedic condition(s)  Diet Patient reports consuming 3 meals a day and 1 snack(s) a day Patient reports that her primary diet is: Diabetic Patient reports that she does have regular access to food.   Depression Screen PHQ 2/9 Scores 06/05/2019 11/14/2018 10/16/2018 04/12/2018 03/03/2018 12/12/2017 11/08/2017  PHQ - 2 Score 0 0 0 0 0 2 6  PHQ- 9 Score - 0 - - - 8 24     Fall Risk Fall Risk  06/05/2019 11/14/2018 10/16/2018 04/12/2018 03/03/2018  Falls in the past year? 0 0 No No No  Follow up Falls prevention discussed - - - -     Objective:  Bethany [name] seemed alert and oriented and she participated  appropriately during our telephone visit.  Blood Pressure Weight BMI  BP Readings from Last 3 Encounters:  11/14/18 (!) 137/95  10/16/18 134/87  04/12/18 126/86   Wt Readings from Last 3 Encounters:  11/14/18 225 lb (102.1 kg)  10/16/18 226 lb 3.2 oz (102.6 kg)  04/12/18 234 lb 8 oz (106.4 kg)   BMI Readings from Last 1 Encounters:  11/14/18 37.44 kg/m    *Unable to obtain current vital signs, weight, and BMI due to telephone visit type  Hearing/Vision  . Bethany [name] did not seem to have difficulty with hearing/understanding during the telephone conversation . Reports that she has had a formal eye exam by an eye care professional within the past year . Reports that she has not had a formal hearing evaluation within the past year *Unable to fully assess hearing and vision during telephone visit type  Cognitive Function: 6CIT Screen 06/05/2019  What Year? 0 points  What month? 0 points  What time? 0 points  Count back from 20 0 points  Months in reverse 0 points  Repeat phrase 0 points  Total Score 0    Normal Cognitive Function Screening: Yes (Normal:0-7, Significant for Dysfunction: >8)  Immunization & Health Maintenance Record Immunization History  Administered Date(s) Administered  . Influenza, High Dose Seasonal PF 10/08/2016, 09/21/2017, 10/02/2018  . Influenza,inj,Quad PF,6+ Mos 09/17/2013, 10/01/2015  . Influenza-Unspecified 09/26/2014  . Pneumococcal Conjugate-13 10/01/2015  . Pneumococcal Polysaccharide-23 09/21/2017  . Tdap 09/03/2016    Health Maintenance  Topic Date Due  . COLONOSCOPY  03/12/2000  . MAMMOGRAM  04/19/2018  . INFLUENZA VACCINE  07/28/2019  . TETANUS/TDAP  09/03/2026  . DEXA SCAN  Completed  . Hepatitis C Screening  Completed  . PNA vac Low Risk Adult  Completed       Assessment  This is a routine wellness examination for Bethany [name].  Health Maintenance: Due or Overdue  Health Maintenance Due  Topic Date Due  . COLONOSCOPY   03/12/2000  . MAMMOGRAM  04/19/2018    Cologuard ordered Mammogram scheduled for 07/24/2019 here on mobile unit   Bethany [name] does not need a referral for Community Assistance: Care Management:   no Social Work:    no Prescription Assistance:  no Nutrition/Diabetes Education:  no   Plan:  Personalized Goals Goals Addressed            This Visit's Progress   . Plan meals       Once per week plan menu for the rest of week - include mostly healthy food options.       Personalized Health Maintenance & Screening Recommendations  Screening mammography Colorectal cancer screening Advanced directives: has an advanced directive - a copy HAS NOT been provided.  Lung Cancer Screening Recommended: no (Low Dose CT Chest recommended if Age 30-80 years, 30 pack-year currently smoking OR have quit w/in past 15 years) Hepatitis C Screening recommended: completed 09/03/2016   Advanced Directives: Written information was not prepared per patient's request.  Referrals & Orders Orders Placed This Encounter  Procedures  . Cologuard    Follow-up Plan . Follow-up with Claretta Fraise, MD as planned . Work on your goal of meal planning . At your convenience, please bring a copy of your Advance Directives (Healthcare Power of Attorney and Living Will) to our office to be filed in your medical record. .    I have personally reviewed and noted the following in the patient's chart:   . Medical and social history . Use of alcohol, tobacco or illicit drugs  . Current medications and supplements . Functional ability and status . Nutritional status . Physical activity . Advanced directives . List of other physicians . Hospitalizations, surgeries, and ER visits in previous 12 months . Vitals . Screenings to include cognitive, depression, and falls . Referrals and appointments  In addition, I have reviewed and discussed with Bethany [name] certain preventive protocols, quality metrics,  and best practice recommendations. A written personalized care plan for preventive services as well as general preventive health recommendations is available and can be mailed to the patient at her request.      Bethany [name] M  06/05/2019

## 2019-06-07 NOTE — Progress Notes (Addendum)
MEDICARE ANNUAL WELLNESS VISIT  06/07/2019  Telephone Visit Disclaimer This Medicare AWV was conducted by telephone due to national recommendations for restrictions regarding the COVID-19 Pandemic (e.g. social distancing).  I verified, using two identifiers, that I am speaking with [name] or their authorized healthcare agent. I discussed the limitations, risks, security, and privacy concerns of performing an evaluation and management service by telephone and the potential availability of an in-person appointment in the future. The patient expressed understanding and agreed to proceed.   Subjective:  Shaletha Humble is a 68 y.o. female patient of Stacks, Cletus Gash, MD who had a Medicare Annual Wellness Visit today via telephone. Samamtha is retired from [employer], and lives with her husband. She has 4 children, 1 is deceased.  She has 3 grandchildren and 1 great grandchild. She reports that she is socially active and does interact with friends/family regularly. She is active in her church and attends regularly.  She is moderately physically active, walking 30 minutes per day 6 days per week.  Mrs. Hazell enjoys sewing.    Patient Care Team: Claretta Fraise, MD as PCP - General (Family Medicine) Gaynelle Arabian, MD as Consulting Physician (Orthopedic Surgery)  Advanced Directives 06/05/2019 06/08/2018 06/27/2015 06/25/2015 06/11/2015 06/11/2015 05/14/2015  Does Patient Have a Medical Advance Directive? Yes Yes No Yes Yes Yes Yes  Type of Paramedic of [place];Living will - - Healthcare Power of Attorney;Mental [place];Living will [place];Living will [place];Living will  Does patient want to make changes to medical advance directive? No - Patient declined - - - No - Patient declined - -  Copy of [place] in Chart? No - copy requested - - - Yes Yes No - copy requested   Pre-existing out of facility DNR order (yellow form or pink MOST form) - - - - - - -    Hospital Utilization Over the Past 12 Months: # of hospitalizations or ER visits: 0 # of surgeries: 0  Review of Systems    Patient reports that her overall health is unchanged compared to last year.    Review of Systems:   Musculoskeletal - right buttocks and leg pain  All other systems negative.  Pain Assessment Pain Score: 8      Current Medications & Allergies (verified) Allergies as of 06/05/2019      Reactions   Hctz [hydrochlorothiazide]    Hair loss      Medication List       Accurate as of June 05, 2019 11:59 PM. If you have any questions, ask your nurse or doctor.        acyclovir 200 MG capsule Commonly known as: ZOVIRAX TAKE 1 CAPSULE EVERY DAY AS NEEDED FOR  FLAREUPS   amLODipine 5 MG tablet Commonly known as: NORVASC Take 1 tablet (5 mg total) by mouth daily. For blood pressure   clotrimazole-betamethasone cream Commonly known as: LOTRISONE Apply 1 application topically 2 (two) times daily.   diclofenac 75 MG EC tablet Commonly known as: VOLTAREN Take 1 tablet (75 mg total) by mouth 2 (two) times daily. For muscle and  Joint pain   fluticasone 50 MCG/ACT nasal spray Commonly known as: FLONASE Place 2 sprays into both nostrils as needed.   furosemide 20 MG tablet Commonly known as: LASIX Take 1 tablet (20 mg total) by mouth every morning.   meclizine 25 MG tablet Commonly known as: ANTIVERT TAKE 1  TABLET THREE TIMES DAILY AS NEEDED  FOR  DIZZINESS   metFORMIN 750 MG 24 hr tablet Commonly known as: GLUCOPHAGE-XR Take 1 tablet (750 mg total) by mouth daily with breakfast.   olmesartan 20 MG tablet Commonly known as: BENICAR Take 1 tablet (20 mg total) by mouth daily.   pravastatin 40 MG tablet Commonly known as: PRAVACHOL Take 1 tablet (40 mg total) by mouth every morning.   Ventolin HFA 108 (90 Base) MCG/ACT inhaler Generic drug: albuterol  INHALE 2 PUFFS BY MOUTH EVERY 6 HOURS AS NEEDED FOR WHEEZING OR SHORTNESS OF BREATH       History (reviewed): Past Medical History:  Diagnosis Date  . Allergy 1997   knees  . Arthritis   . GERD (gastroesophageal reflux disease)   . Hyperlipidemia   . Hypertension   . Obesity   . Pneumonia    1 month ago - rsolved  . Seasonal allergies   . Sleep apnea    cpap   Past Surgical History:  Procedure Laterality Date  . ABDOMINAL HYSTERECTOMY    . BREAST SURGERY     fibroid cysts removed  . GANGLION CYST EXCISION     left  . phlebectomy    . TOTAL KNEE ARTHROPLASTY Right 10/22/2013   Procedure: RIGHT TOTAL KNEE ARTHROPLASTY;  Surgeon: Gearlean Alf, MD;  Location: [facility];  Service: Orthopedics;  Laterality: Right;  . TOTAL KNEE ARTHROPLASTY Left 06/11/2015   Procedure: LEFT TOTAL KNEE ARTHROPLASTY;  Surgeon: Gaynelle Arabian, MD;  Location: [facility];  Service: Orthopedics;  Laterality: Left;  . TUBAL LIGATION     Family History  Problem Relation Age of Onset  . Heart disease Mother   . Early death Father        Shooting  . Diabetes Sister   . Arthritis Brother        knees  . Heart disease Sister        tumor in the heart   Social History   Socioeconomic History  . Marital status: Married    Spouse name: Not on file  . Number of children: Not on file  . Years of education: 20  . Highest education level: High school graduate  Occupational History  . Occupation: retired    Fish farm manager: [employer]  Social Needs  . Financial resource strain: Not hard at all  . Food insecurity    Worry: Never true    Inability: Never true  . Transportation needs    Medical: No    Non-medical: No  Tobacco Use  . Smoking status: Never Smoker  . Smokeless tobacco: Never Used  Substance and Sexual Activity  . Alcohol use: No  . Drug use: No  . Sexual activity: Not Currently    Comment: husband not able to have  sex  Lifestyle  . Physical activity    Days per week: 6 days    Minutes  per session: 30 min  . Stress: Only a little  Relationships  . Social connections    Talks on phone: More than three times a week    Gets together: More than three times a week    Attends religious service: More than 4 times per year    Active member of club or organization: No    Attends meetings of clubs or organizations: Never    Relationship status: Married  Other Topics Concern  . Not on file  Social History Narrative  . Not on file    Activities  of Daily Living In your present state of health, do you have any difficulty performing the following activities: 06/05/2019  Hearing? N  Vision? N  Difficulty concentrating or making decisions? N  Walking or climbing stairs? N  Dressing or bathing? N  Doing errands, shopping? N  Preparing Food and eating ? N  Using the Toilet? N  In the past six months, have you accidently leaked urine? N  Do you have problems with loss of bowel control? N  Managing your Medications? N  Managing your Finances? N  Housekeeping or managing your Housekeeping? N  Some recent data might be hidden    Patient Literacy    Exercise Current Exercise Habits: Home exercise routine, Type of exercise: walking, Time (Minutes): 30, Frequency (Times/Week): 5, Weekly Exercise (Minutes/Week): 150, Intensity: Moderate, Exercise limited by: orthopedic condition(s)  Diet Patient reports consuming 3 meals a day and 1 snack(s) a day Patient reports that her primary diet is: Diabetic Patient reports that she does have regular access to food.   Depression Screen PHQ 2/9 Scores 06/05/2019 11/14/2018 10/16/2018 04/12/2018 03/03/2018 12/12/2017 11/08/2017  PHQ - 2 Score 0 0 0 0 0 2 6  PHQ- 9 Score - 0 - - - 8 24     Fall Risk Fall Risk  06/05/2019 11/14/2018 10/16/2018 04/12/2018 03/03/2018  Falls in the past year? 0 0 No No No  Follow up Falls prevention discussed - - - -     Objective:  [name] seemed alert and oriented and she participated appropriately  during our telephone visit.  Blood Pressure Weight BMI  BP Readings from Last 3 Encounters:  11/14/18 (!) 137/95  10/16/18 134/87  04/12/18 126/86   Wt Readings from Last 3 Encounters:  11/14/18 225 lb (102.1 kg)  10/16/18 226 lb 3.2 oz (102.6 kg)  04/12/18 234 lb 8 oz (106.4 kg)   BMI Readings from Last 1 Encounters:  11/14/18 37.44 kg/m    *Unable to obtain current vital signs, weight, and BMI due to telephone visit type  Hearing/Vision  . Leianna did not seem to have difficulty with hearing/understanding during the telephone conversation . Reports that she has had a formal eye exam by an eye care professional within the past year . Reports that she has not had a formal hearing evaluation within the past year *Unable to fully assess hearing and vision during telephone visit type  Cognitive Function: 6CIT Screen 06/05/2019  What Year? 0 points  What month? 0 points  What time? 0 points  Count back from 20 0 points  Months in reverse 0 points  Repeat phrase 0 points  Total Score 0    Normal Cognitive Function Screening: Yes (Normal:0-7, Significant for Dysfunction: >8)  Immunization & Health Maintenance Record Immunization History  Administered Date(s) Administered  . Influenza, High Dose Seasonal PF 10/08/2016, 09/21/2017, 10/02/2018  . Influenza,inj,Quad PF,6+ Mos 09/17/2013, 10/01/2015  . Influenza-Unspecified 09/26/2014  . Pneumococcal Conjugate-13 10/01/2015  . Pneumococcal Polysaccharide-23 09/21/2017  . Tdap 09/03/2016    Health Maintenance  Topic Date Due  . COLONOSCOPY  03/12/2000  . MAMMOGRAM  04/19/2018  . INFLUENZA VACCINE  07/28/2019  . TETANUS/TDAP  09/03/2026  . DEXA SCAN  Completed  . Hepatitis C Screening  Completed  . PNA vac Low Risk Adult  Completed       Assessment  This is a routine wellness examination for Coralee Edberg.  Health Maintenance: Due or Overdue Health Maintenance Due  Topic Date Due  . COLONOSCOPY  03/12/2000  .  MAMMOGRAM  04/19/2018    Cologuard ordered Mammogram scheduled for 07/24/2019 here on mobile unit   Deeandra Jerry does not need a referral for Community Assistance: Care Management:   no Social Work:    no Prescription Assistance:  no Nutrition/Diabetes Education:  no   Plan:  Personalized Goals Goals Addressed            This Visit's Progress   . Plan meals       Once per week plan menu for the rest of week - include mostly healthy food options.       Personalized Health Maintenance & Screening Recommendations  Screening mammography Colorectal cancer screening Advanced directives: has an advanced directive - a copy HAS NOT been provided.  Lung Cancer Screening Recommended: no (Low Dose CT Chest recommended if Age 60-80 years, 30 pack-year currently smoking OR have quit w/in past 15 years) Hepatitis C Screening recommended: completed 09/03/2016   Advanced Directives: Written information was not prepared per patient's request.  Referrals & Orders Orders Placed This Encounter  Procedures  . Cologuard    Follow-up Plan . Follow-up with Claretta Fraise, MD as planned . Work on your goal of meal planning . At your convenience, please bring a copy of your Advance Directives (Healthcare Power of Attorney and Living Will) to our office to be filed in your medical record. .    I have personally reviewed and noted the following in the patient's chart:   . Medical and social history . Use of alcohol, tobacco or illicit drugs  . Current medications and supplements . Functional ability and status . Nutritional status . Physical activity . Advanced directives . List of other physicians . Hospitalizations, surgeries, and ER visits in previous 12 months . Vitals . Screenings to include cognitive, depression, and falls . Referrals and appointments  In addition, I have reviewed and discussed with [name] certain preventive protocols, quality metrics, and best  practice recommendations. A written personalized care plan for preventive services as well as general preventive health recommendations is available and can be mailed to the patient at her request.      Nolberto Hanlon, RN  06/07/2019

## 2019-06-14 ENCOUNTER — Telehealth: Payer: Self-pay | Admitting: Family Medicine

## 2019-06-15 ENCOUNTER — Other Ambulatory Visit: Payer: Self-pay | Admitting: Family Medicine

## 2019-06-15 MED ORDER — SITAGLIP PHOS-METFORMIN HCL ER 50-500 MG PO TB24: 1.0000 | ORAL_TABLET | ORAL | 2 refills | Status: DC

## 2019-06-15 NOTE — Telephone Encounter (Signed)
Please contact the patient I sent in [place]. IT has a little less metformin, but also has a second ingredient that makes it better.

## 2019-06-15 NOTE — Telephone Encounter (Signed)
Patient aware of recommendation.  

## 2019-06-26 DIAGNOSIS — M9903 Segmental and somatic dysfunction of lumbar region: Secondary | ICD-10-CM | POA: Diagnosis not present

## 2019-06-26 DIAGNOSIS — M9904 Segmental and somatic dysfunction of sacral region: Secondary | ICD-10-CM | POA: Diagnosis not present

## 2019-06-26 DIAGNOSIS — M9905 Segmental and somatic dysfunction of pelvic region: Secondary | ICD-10-CM | POA: Diagnosis not present

## 2019-06-26 DIAGNOSIS — M62830 Muscle spasm of back: Secondary | ICD-10-CM | POA: Diagnosis not present

## 2019-07-03 DIAGNOSIS — H43813 Vitreous degeneration, bilateral: Secondary | ICD-10-CM | POA: Diagnosis not present

## 2019-07-03 DIAGNOSIS — H259 Unspecified age-related cataract: Secondary | ICD-10-CM | POA: Diagnosis not present

## 2019-07-03 DIAGNOSIS — E119 Type 2 diabetes mellitus without complications: Secondary | ICD-10-CM | POA: Diagnosis not present

## 2019-07-03 DIAGNOSIS — H04123 Dry eye syndrome of bilateral lacrimal glands: Secondary | ICD-10-CM | POA: Diagnosis not present

## 2019-07-03 LAB — HM DIABETES EYE EXAM

## 2019-07-04 ENCOUNTER — Encounter: Payer: Self-pay | Admitting: *Deleted

## 2019-07-04 DIAGNOSIS — M9905 Segmental and somatic dysfunction of pelvic region: Secondary | ICD-10-CM | POA: Diagnosis not present

## 2019-07-04 DIAGNOSIS — M62830 Muscle spasm of back: Secondary | ICD-10-CM | POA: Diagnosis not present

## 2019-07-04 DIAGNOSIS — M9904 Segmental and somatic dysfunction of sacral region: Secondary | ICD-10-CM | POA: Diagnosis not present

## 2019-07-04 DIAGNOSIS — M9903 Segmental and somatic dysfunction of lumbar region: Secondary | ICD-10-CM | POA: Diagnosis not present

## 2019-07-05 DIAGNOSIS — M62830 Muscle spasm of back: Secondary | ICD-10-CM | POA: Diagnosis not present

## 2019-07-05 DIAGNOSIS — M9903 Segmental and somatic dysfunction of lumbar region: Secondary | ICD-10-CM | POA: Diagnosis not present

## 2019-07-05 DIAGNOSIS — M9905 Segmental and somatic dysfunction of pelvic region: Secondary | ICD-10-CM | POA: Diagnosis not present

## 2019-07-05 DIAGNOSIS — M9904 Segmental and somatic dysfunction of sacral region: Secondary | ICD-10-CM | POA: Diagnosis not present

## 2019-07-20 DIAGNOSIS — Z1212 Encounter for screening for malignant neoplasm of rectum: Secondary | ICD-10-CM | POA: Diagnosis not present

## 2019-07-20 DIAGNOSIS — Z1211 Encounter for screening for malignant neoplasm of colon: Secondary | ICD-10-CM | POA: Diagnosis not present

## 2019-07-24 DIAGNOSIS — Z1231 Encounter for screening mammogram for malignant neoplasm of breast: Secondary | ICD-10-CM | POA: Diagnosis not present

## 2019-08-06 LAB — COLOGUARD: Cologuard: NEGATIVE

## 2019-08-06 NOTE — Progress Notes (Signed)
Hello [place],  Your lab result is normal and/or stable.Some minor variations that are not significant are commonly marked abnormal, but do not represent any medical problem for you.  Best regards, Yaneisy Wenz, M.D.

## 2019-08-10 DIAGNOSIS — M25562 Pain in left knee: Secondary | ICD-10-CM | POA: Diagnosis not present

## 2019-08-21 DIAGNOSIS — M62830 Muscle spasm of back: Secondary | ICD-10-CM | POA: Diagnosis not present

## 2019-08-21 DIAGNOSIS — M9903 Segmental and somatic dysfunction of lumbar region: Secondary | ICD-10-CM | POA: Diagnosis not present

## 2019-08-21 DIAGNOSIS — M9904 Segmental and somatic dysfunction of sacral region: Secondary | ICD-10-CM | POA: Diagnosis not present

## 2019-08-21 DIAGNOSIS — M9905 Segmental and somatic dysfunction of pelvic region: Secondary | ICD-10-CM | POA: Diagnosis not present

## 2019-08-28 ENCOUNTER — Other Ambulatory Visit: Payer: Self-pay | Admitting: *Deleted

## 2019-08-28 MED ORDER — OLMESARTAN MEDOXOMIL 20 MG PO TABS: 20.0000 mg | ORAL_TABLET | Freq: Every day | ORAL | 0 refills | Status: DC

## 2019-08-28 MED ORDER — DICLOFENAC SODIUM 75 MG PO TBEC: 75.0000 mg | DELAYED_RELEASE_TABLET | Freq: Two times a day (BID) | ORAL | 3 refills | Status: DC

## 2019-08-28 MED ORDER — ACYCLOVIR 200 MG PO CAPS: ORAL_CAPSULE | ORAL | 0 refills | Status: DC

## 2019-08-28 MED ORDER — AMLODIPINE BESYLATE 5 MG PO TABS: 5.0000 mg | ORAL_TABLET | Freq: Every day | ORAL | 0 refills | Status: DC

## 2019-08-28 MED ORDER — FUROSEMIDE 20 MG PO TABS: 20.0000 mg | ORAL_TABLET | ORAL | 0 refills | Status: DC

## 2019-08-28 MED ORDER — MECLIZINE HCL 25 MG PO TABS: ORAL_TABLET | ORAL | 0 refills | Status: DC

## 2019-08-28 MED ORDER — PRAVASTATIN SODIUM 40 MG PO TABS: 40.0000 mg | ORAL_TABLET | Freq: Every morning | ORAL | 0 refills | Status: DC

## 2019-08-28 NOTE — Addendum Note (Signed)
Addended by: Antonietta Barcelona D on: 08/28/2019 09:54 AM   Modules accepted: Orders

## 2019-08-28 NOTE — Addendum Note (Signed)
Addended by: Antonietta Barcelona D on: 08/28/2019 10:04 AM   Modules accepted: Orders

## 2019-09-04 DIAGNOSIS — M9901 Segmental and somatic dysfunction of cervical region: Secondary | ICD-10-CM | POA: Diagnosis not present

## 2019-09-04 DIAGNOSIS — M9903 Segmental and somatic dysfunction of lumbar region: Secondary | ICD-10-CM | POA: Diagnosis not present

## 2019-09-04 DIAGNOSIS — M9904 Segmental and somatic dysfunction of sacral region: Secondary | ICD-10-CM | POA: Diagnosis not present

## 2019-09-04 DIAGNOSIS — M62830 Muscle spasm of back: Secondary | ICD-10-CM | POA: Diagnosis not present

## 2019-09-05 DIAGNOSIS — M9903 Segmental and somatic dysfunction of lumbar region: Secondary | ICD-10-CM | POA: Diagnosis not present

## 2019-09-05 DIAGNOSIS — M62830 Muscle spasm of back: Secondary | ICD-10-CM | POA: Diagnosis not present

## 2019-09-05 DIAGNOSIS — M9901 Segmental and somatic dysfunction of cervical region: Secondary | ICD-10-CM | POA: Diagnosis not present

## 2019-09-05 DIAGNOSIS — M9904 Segmental and somatic dysfunction of sacral region: Secondary | ICD-10-CM | POA: Diagnosis not present

## 2019-09-06 DIAGNOSIS — M9901 Segmental and somatic dysfunction of cervical region: Secondary | ICD-10-CM | POA: Diagnosis not present

## 2019-09-06 DIAGNOSIS — M62830 Muscle spasm of back: Secondary | ICD-10-CM | POA: Diagnosis not present

## 2019-09-06 DIAGNOSIS — M9904 Segmental and somatic dysfunction of sacral region: Secondary | ICD-10-CM | POA: Diagnosis not present

## 2019-09-06 DIAGNOSIS — M9903 Segmental and somatic dysfunction of lumbar region: Secondary | ICD-10-CM | POA: Diagnosis not present

## 2019-09-17 ENCOUNTER — Encounter: Payer: Self-pay | Admitting: Family Medicine

## 2019-09-17 ENCOUNTER — Ambulatory Visit (INDEPENDENT_AMBULATORY_CARE_PROVIDER_SITE_OTHER): Payer: Medicare Other | Admitting: Family Medicine

## 2019-09-17 ENCOUNTER — Other Ambulatory Visit: Payer: Self-pay

## 2019-09-17 DIAGNOSIS — R42 Dizziness and giddiness: Secondary | ICD-10-CM

## 2019-09-17 DIAGNOSIS — H9201 Otalgia, right ear: Secondary | ICD-10-CM | POA: Diagnosis not present

## 2019-09-17 MED ORDER — AMOXICILLIN-POT CLAVULANATE 875-125 MG PO TABS: 1.0000 | ORAL_TABLET | Freq: Two times a day (BID) | ORAL | 0 refills | Status: DC

## 2019-09-17 NOTE — Progress Notes (Signed)
Subjective:    Patient ID: Bethany [name], female    DOB: May 20, 1951, 68 y.o.   MRN: [id number]   HPI: Bethany [name] is a 68 y.o. female presenting for right ear pain for a week. Dull ache. Occasional sharp stabbing. Intermittent vertigp associated. Partial relief with meclizine.    Depression screen [facility] 2/9 06/05/2019 11/14/2018 10/16/2018 04/12/2018 03/03/2018  Decreased Interest 0 0 0 0 0  Down, Depressed, Hopeless 0 0 0 0 0  PHQ - 2 Score 0 0 0 0 0  Altered sleeping - 0 - - -  Tired, decreased energy - 0 - - -  Change in appetite - 0 - - -  Feeling bad or failure about yourself  - 0 - - -  Trouble concentrating - 0 - - -  Moving slowly or fidgety/restless - 0 - - -  Suicidal thoughts - - - - -  PHQ-9 Score - 0 - - -  Difficult doing work/chores - - - - -     Relevant past medical, surgical, family and social history reviewed and updated as indicated.  Interim medical history since our last visit reviewed. Allergies and medications reviewed and updated.  ROS:  Review of Systems  Constitutional: Negative.  Negative for appetite change, chills, diaphoresis, fatigue and fever.  HENT: Negative.  Negative for congestion, ear pain, hearing loss, postnasal drip, rhinorrhea, sore throat and trouble swallowing.   Eyes: Negative for visual disturbance.  Respiratory: Negative for cough, chest tightness and shortness of breath.   Cardiovascular: Negative for chest pain and palpitations.  Gastrointestinal: Negative for abdominal pain.  Musculoskeletal: Negative for arthralgias.  Skin: Negative for rash.     Social History   Tobacco Use  Smoking Status Never Smoker  Smokeless Tobacco Never Used       Objective:     Wt Readings from Last 3 Encounters:  11/14/18 225 lb (102.1 kg)  10/16/18 226 lb 3.2 oz (102.6 kg)  04/12/18 234 lb 8 oz (106.4 kg)     Exam deferred. Pt. [place] due to COVID 19. Phone visit performed.   Assessment & Plan:   1. Right ear pain   2.  Vertigo     Meds ordered this encounter  Medications  . amoxicillin-clavulanate (AUGMENTIN) 875-125 MG tablet    Sig: Take 1 tablet by mouth 2 (two) times daily. Take all of this medication    Dispense:  20 tablet    Refill:  0    No orders of the defined types were placed in this encounter.     Diagnoses and all orders for this visit:  Right ear pain  Vertigo  Other orders -     amoxicillin-clavulanate (AUGMENTIN) 875-125 MG tablet; Take 1 tablet by mouth 2 (two) times daily. Take all of this medication    Virtual Visit via telephone Note  I discussed the limitations, risks, security and privacy concerns of performing an evaluation and management service by telephone and the availability of in person appointments. The patient was identified with two identifiers. Pt.expressed understanding and agreed to proceed. Pt. Is at home. Dr. Livia Snellen is in his office.  Follow Up Instructions:   I discussed the assessment and treatment plan with the patient. The patient was provided an opportunity to ask questions and all were answered. The patient agreed with the plan and demonstrated an understanding of the instructions.   The patient was advised to call back or seek an in-person evaluation if the symptoms worsen or  if the condition fails to improve as anticipated.   Total minutes including chart review and phone contact time: 11   Follow up plan: Return if symptoms worsen or fail to improve.  Claretta Fraise, MD [place]

## 2019-10-18 ENCOUNTER — Ambulatory Visit: Payer: Medicare Other | Admitting: Family Medicine

## 2019-10-19 ENCOUNTER — Ambulatory Visit: Payer: Medicare Other | Admitting: Family Medicine

## 2019-10-23 ENCOUNTER — Other Ambulatory Visit: Payer: Self-pay

## 2019-10-24 ENCOUNTER — Encounter: Payer: Self-pay | Admitting: Family Medicine

## 2019-10-24 ENCOUNTER — Telehealth: Payer: Self-pay | Admitting: Family Medicine

## 2019-10-24 ENCOUNTER — Other Ambulatory Visit: Payer: Self-pay

## 2019-10-24 ENCOUNTER — Ambulatory Visit (INDEPENDENT_AMBULATORY_CARE_PROVIDER_SITE_OTHER): Payer: Medicare Other | Admitting: Family Medicine

## 2019-10-24 VITALS — BP 148/96 | HR 66 | Temp 97.7°F | Resp 20 | Ht 65.0 in | Wt 223.0 lb

## 2019-10-24 DIAGNOSIS — Z23 Encounter for immunization: Secondary | ICD-10-CM | POA: Diagnosis not present

## 2019-10-24 DIAGNOSIS — Z6837 Body mass index (BMI) 37.0-37.9, adult: Secondary | ICD-10-CM

## 2019-10-24 DIAGNOSIS — E782 Mixed hyperlipidemia: Secondary | ICD-10-CM

## 2019-10-24 DIAGNOSIS — I10 Essential (primary) hypertension: Secondary | ICD-10-CM

## 2019-10-24 DIAGNOSIS — M1990 Unspecified osteoarthritis, unspecified site: Secondary | ICD-10-CM

## 2019-10-24 DIAGNOSIS — R7303 Prediabetes: Secondary | ICD-10-CM

## 2019-10-24 LAB — BAYER DCA HB A1C WAIVED: HB A1C (BAYER DCA - WAIVED): 5.8 % (ref <7.0)

## 2019-10-24 NOTE — Progress Notes (Signed)
Subjective:  Patient ID: Bethany Bethany [name], female    DOB: 23-Dec-1951  Age: 68 y.o. MRN: [id number]  CC: Medical Management of Chronic Issues   HPI Bethany Bethany [name] presents for  Follow-up of hypertension. Patient has no history of headache chest pain or shortness of breath or recent cough. Patient also denies symptoms of TIA such as numbness weakness lateralizing. Patient checks  blood pressure at home and has not had any elevated readings recently. Patient denies side effects from his medication. States taking it regularly. Patient in for follow-up of elevated cholesterol. Doing well without complaints on current medication. Denies side effects of statin including myalgia and arthralgia and nausea. Also in today for liver function testing. Currently no chest pain, shortness of breath or other cardiovascular related symptoms noted.  Patient also having a lot of arthritis pain in the hands.  This as well as other joints is adequately controlled currently with diclofenac.  It can slow her down sometimes with knee pain.  She has a sister with rheumatoid arthritis.  She is concerned that she may have that as well. she says her sister suffered a lot with her diagnosis.    Depression screen [facility] 2/9 10/24/2019 06/05/2019 11/14/2018  Decreased Interest 0 0 0  Down, Depressed, Hopeless 0 0 0  PHQ - 2 Score 0 0 0  Altered sleeping - - 0  Tired, decreased energy - - 0  Change in appetite - - 0  Feeling bad or failure about yourself  - - 0  Trouble concentrating - - 0  Moving slowly or fidgety/restless - - 0  Suicidal thoughts - - -  PHQ-9 Score - - 0  Difficult doing work/chores - - -    History Bethany Bethany [name] has a past medical history of Allergy (1997), Arthritis, GERD (gastroesophageal reflux disease), Hyperlipidemia, Hypertension, Obesity, Pneumonia, Seasonal allergies, and Sleep apnea.   She has a past surgical history that includes Abdominal hysterectomy; Tubal ligation; phlebectomy; Total knee  arthroplasty (Right, 10/22/2013); Ganglion cyst excision; Breast surgery; and Total knee arthroplasty (Left, 06/11/2015).   Her family history includes Arthritis in her brother; Diabetes in her sister; Early death in her father; Heart disease in her mother and sister.She reports that she has never smoked. She has never used smokeless tobacco. She reports that she does not drink alcohol or use drugs.    ROS Review of Systems  Constitutional: Negative.   HENT: Negative for congestion.   Eyes: Negative for visual disturbance.  Respiratory: Negative for shortness of breath.   Cardiovascular: Negative for chest pain.  Gastrointestinal: Negative for abdominal pain, constipation, diarrhea, nausea and vomiting.  Genitourinary: Negative for difficulty urinating.  Musculoskeletal: Positive for arthralgias (fingers). Negative for myalgias.  Neurological: Negative for headaches.  Psychiatric/Behavioral: Negative for sleep disturbance.    Objective:  BP (!) 148/96   Pulse 66   Temp 97.7 F (36.5 C) (Temporal)   Resp 20   Ht 5' 5"  (1.651 m)   Wt 223 lb (101.2 kg)   SpO2 98%   BMI 37.11 kg/m   BP Readings from Last 3 Encounters:  10/24/19 (!) 148/96  11/14/18 (!) 137/95  10/16/18 134/87    Wt Readings from Last 3 Encounters:  10/24/19 223 lb (101.2 kg)  11/14/18 225 lb (102.1 kg)  10/16/18 226 lb 3.2 oz (102.6 kg)     Physical Exam Constitutional:      Bethany [name]: She is not in acute distress.    Appearance: She is well-developed.  HENT:  Head: Normocephalic and atraumatic.     Right Ear: External ear normal.     Left Ear: External ear normal.     Nose: Nose normal.  Eyes:     Conjunctiva/sclera: Conjunctivae normal.     Pupils: Pupils are equal, round, and reactive to light.  Neck:     Musculoskeletal: Normal range of motion and neck supple.     Thyroid: No thyromegaly.  Cardiovascular:     Rate and Rhythm: Normal rate and regular rhythm.     Heart sounds: Normal heart  sounds. No murmur.  Pulmonary:     Effort: Pulmonary effort is normal. No respiratory distress.     Breath sounds: Normal breath sounds. No wheezing or rales.  Abdominal:     Bethany [name]: Bowel sounds are normal. There is no distension.     Palpations: Abdomen is soft.     Tenderness: There is no abdominal tenderness.  Lymphadenopathy:     Cervical: No cervical adenopathy.  Skin:    Bethany [name]: Skin is warm and dry.  Neurological:     Mental Status: She is alert and oriented to person, place, and time.     Deep Tendon Reflexes: Reflexes are normal and symmetric.  Psychiatric:        Behavior: Behavior normal.        Thought Content: Thought content normal.        Judgment: Judgment normal.       Assessment & Plan:   Bethany Bethany [name] was seen today for medical management of chronic issues.  Diagnoses and all orders for this visit:  Arthritis -     CBC with Differential/Platelet -     CMP14+EGFR -     Arthritis Panel  Essential hypertension -     CBC with Differential/Platelet -     CMP14+EGFR  Mixed hyperlipidemia -     CBC with Differential/Platelet -     CMP14+EGFR -     Lipid panel  Class 2 severe obesity due to excess calories with serious comorbidity and body mass index (BMI) of 37.0 to 37.9 in adult (HCC) -     CBC with Differential/Platelet -     CMP14+EGFR  Prediabetes -     Bayer DCA Hb A1c Waived -     CBC with Differential/Platelet -     CMP14+EGFR       I have discontinued Bethany Bethany [name]'s amLODipine and amoxicillin-clavulanate. I am also having her maintain her fluticasone, clotrimazole-betamethasone, Ventolin HFA, SitaGLIPtin-MetFORMIN HCl, acyclovir, furosemide, diclofenac, meclizine, olmesartan, and pravastatin.  Allergies as of 10/24/2019      Reactions   Hctz [hydrochlorothiazide]    Hair loss      Medication List       Accurate as of October 24, 2019  8:28 AM. If you have any questions, ask your nurse or doctor.        STOP taking these  medications   amLODipine 5 MG tablet Commonly known as: NORVASC Stopped by: Claretta Fraise, MD   amoxicillin-clavulanate [phone] MG tablet Commonly known as: AUGMENTIN Stopped by: Claretta Fraise, MD     TAKE these medications   acyclovir 200 MG capsule Commonly known as: ZOVIRAX TAKE 1 CAPSULE EVERY DAY AS NEEDED FOR  FLAREUPS   clotrimazole-betamethasone cream Commonly known as: LOTRISONE Apply 1 application topically 2 (two) times daily.   diclofenac 75 MG EC tablet Commonly known as: VOLTAREN Take 1 tablet (75 mg total) by mouth 2 (two) times daily. For muscle and  Joint pain  fluticasone 50 MCG/ACT nasal spray Commonly known as: FLONASE Place 2 sprays into both nostrils as needed.   furosemide 20 MG tablet Commonly known as: LASIX Take 1 tablet (20 mg total) by mouth every morning.   meclizine 25 MG tablet Commonly known as: ANTIVERT TAKE 1 TABLET THREE TIMES DAILY AS NEEDED  FOR  DIZZINESS   olmesartan 20 MG tablet Commonly known as: BENICAR Take 1 tablet (20 mg total) by mouth daily.   pravastatin 40 MG tablet Commonly known as: PRAVACHOL Take 1 tablet (40 mg total) by mouth every morning.   SitaGLIPtin-MetFORMIN HCl 50-500 MG Tb24 Take 1 tablet by mouth every morning.   Ventolin HFA 108 (90 Base) MCG/ACT inhaler Generic drug: albuterol INHALE 2 PUFFS BY MOUTH EVERY 6 HOURS AS NEEDED FOR WHEEZING OR SHORTNESS OF BREATH        Follow-up: No follow-ups on file.  Claretta Fraise, M.D.

## 2019-10-24 NOTE — Telephone Encounter (Signed)
She doesn't need to after all because her A1c was excellent at 5.8 WS

## 2019-10-25 LAB — ARTHRITIS PANEL
Basophils Absolute: 0.1 10*3/uL (ref 0.0–0.2)
Basos: 1 %
EOS (ABSOLUTE): 0.1 10*3/uL (ref 0.0–0.4)
Eos: 2 %
Hematocrit: 40.3 % (ref 34.0–46.6)
Hemoglobin: 13 g/dL (ref 11.1–15.9)
Immature Grans (Abs): 0 10*3/uL (ref 0.0–0.1)
Immature Granulocytes: 0 %
Lymphocytes Absolute: 2.4 10*3/uL (ref 0.7–3.1)
Lymphs: 25 %
MCH: 28.2 pg (ref 26.6–33.0)
MCHC: 32.3 g/dL (ref 31.5–35.7)
MCV: 87 fL (ref 79–97)
Monocytes Absolute: 0.7 10*3/uL (ref 0.1–0.9)
Monocytes: 7 %
Neutrophils Absolute: 6.2 10*3/uL (ref 1.4–7.0)
Neutrophils: 65 %
Platelets: 275 10*3/uL (ref 150–450)
RBC: 4.61 x10E6/uL (ref 3.77–5.28)
RDW: 13.7 % (ref 11.7–15.4)
Rheumatoid fact SerPl-aCnc: <10 IU/mL (ref 0.0–13.9)
Sed Rate: 24 mm/hr (ref 0–40)
Uric Acid: 4.3 mg/dL (ref 2.5–7.1)
WBC: 9.5 10*3/uL (ref 3.4–10.8)

## 2019-10-25 LAB — CMP14+EGFR
ALT: 19 IU/L (ref 0–32)
AST: 20 IU/L (ref 0–40)
Albumin/Globulin Ratio: 1.7 (ref 1.2–2.2)
Albumin: 4.3 g/dL (ref 3.8–4.8)
Alkaline Phosphatase: 83 IU/L (ref 39–117)
BUN/Creatinine Ratio: 15 (ref 12–28)
BUN: 12 mg/dL (ref 8–27)
Bilirubin Total: 0.8 mg/dL (ref 0.0–1.2)
CO2: 25 mmol/L (ref 20–29)
Calcium: 10 mg/dL (ref 8.7–10.3)
Chloride: 107 mmol/L — ABNORMAL HIGH (ref 96–106)
Creatinine, Ser: 0.78 mg/dL (ref 0.57–1.00)
GFR calc Af Amer: 90 mL/min/{1.73_m2} (ref >59)
GFR calc non Af Amer: 78 mL/min/{1.73_m2} (ref >59)
Globulin, Total: 2.6 g/dL (ref 1.5–4.5)
Glucose: 91 mg/dL (ref 65–99)
Potassium: 4.3 mmol/L (ref 3.5–5.2)
Sodium: 142 mmol/L (ref 134–144)
Total Protein: 6.9 g/dL (ref 6.0–8.5)

## 2019-10-25 LAB — LIPID PANEL
Chol/HDL Ratio: 3.4 ratio (ref 0.0–4.4)
Cholesterol, Total: 160 mg/dL (ref 100–199)
HDL: 47 mg/dL (ref >39)
LDL Chol Calc (NIH): 95 mg/dL (ref 0–99)
Triglycerides: 96 mg/dL (ref 0–149)
VLDL Cholesterol Cal: 18 mg/dL (ref 5–40)

## 2019-10-25 NOTE — Telephone Encounter (Signed)
Patient aware and verbalizes understanding. 

## 2019-10-25 NOTE — Progress Notes (Signed)
Hello Bethany [name],  Your lab result is normal and/or stable.Some minor variations that are not significant are commonly marked abnormal, but do not represent any medical problem for you.  Best regards, Wash Nienhaus, M.D.

## 2019-10-30 DIAGNOSIS — M9904 Segmental and somatic dysfunction of sacral region: Secondary | ICD-10-CM | POA: Diagnosis not present

## 2019-10-30 DIAGNOSIS — M62830 Muscle spasm of back: Secondary | ICD-10-CM | POA: Diagnosis not present

## 2019-10-30 DIAGNOSIS — M9903 Segmental and somatic dysfunction of lumbar region: Secondary | ICD-10-CM | POA: Diagnosis not present

## 2019-10-30 DIAGNOSIS — M9901 Segmental and somatic dysfunction of cervical region: Secondary | ICD-10-CM | POA: Diagnosis not present

## 2019-11-28 DIAGNOSIS — M9901 Segmental and somatic dysfunction of cervical region: Secondary | ICD-10-CM | POA: Diagnosis not present

## 2019-11-28 DIAGNOSIS — M62830 Muscle spasm of back: Secondary | ICD-10-CM | POA: Diagnosis not present

## 2019-11-28 DIAGNOSIS — M9903 Segmental and somatic dysfunction of lumbar region: Secondary | ICD-10-CM | POA: Diagnosis not present

## 2019-11-28 DIAGNOSIS — M9904 Segmental and somatic dysfunction of sacral region: Secondary | ICD-10-CM | POA: Diagnosis not present

## 2019-12-04 ENCOUNTER — Encounter: Payer: Self-pay | Admitting: Family Medicine

## 2019-12-04 ENCOUNTER — Ambulatory Visit (INDEPENDENT_AMBULATORY_CARE_PROVIDER_SITE_OTHER): Payer: Medicare Other | Admitting: Family Medicine

## 2019-12-04 DIAGNOSIS — M7632 Iliotibial band syndrome, left leg: Secondary | ICD-10-CM

## 2019-12-04 MED ORDER — PREDNISONE 20 MG PO TABS: ORAL_TABLET | ORAL | 0 refills | Status: DC

## 2019-12-04 NOTE — Progress Notes (Signed)
Virtual Visit via telephone Note  I connected with Bethany [name] on 12/04/19 at 1257 by telephone and verified that I am speaking with the correct person using two identifiers. Bethany [name] is currently located at home and no other people are currently with her during visit. The provider, Fransisca Kaufmann Nicholas Ossa, MD is located in their office at time of visit.  Call ended at 1307  I discussed the limitations, risks, security and privacy concerns of performing an evaluation and management service by telephone and the availability of in person appointments. I also discussed with the patient that there may be a patient responsible charge related to this service. The patient expressed understanding and agreed to proceed.   History and Present Illness: patient is complaining of hip pain on the left hip that goes down the outside of that hip down to knee. She has trouble putting weight on it.  It started mild and worsened up till last night, it kept her up.  She is using voltaren gel and voltaren tablet and it has improved today some. She is able to put weight on it today.  range of motion is intact. She was working at [employer] years ago and a similar issue on the side.   No diagnosis found.  Outpatient Encounter Medications as of 12/04/2019  Medication Sig  . acyclovir (ZOVIRAX) 200 MG capsule TAKE 1 CAPSULE EVERY DAY AS NEEDED FOR  FLAREUPS  . clotrimazole-betamethasone (LOTRISONE) cream Apply 1 application topically 2 (two) times daily.  . diclofenac (VOLTAREN) 75 MG EC tablet Take 1 tablet (75 mg total) by mouth 2 (two) times daily. For muscle and  Joint pain  . fluticasone (FLONASE) 50 MCG/ACT nasal spray Place 2 sprays into both nostrils as needed.  . furosemide (LASIX) 20 MG tablet Take 1 tablet (20 mg total) by mouth every morning.  . meclizine (ANTIVERT) 25 MG tablet TAKE 1 TABLET THREE TIMES DAILY AS NEEDED  FOR  DIZZINESS  . olmesartan (BENICAR) 20 MG tablet Take 1 tablet (20 mg total) by  mouth daily.  . pravastatin (PRAVACHOL) 40 MG tablet Take 1 tablet (40 mg total) by mouth every morning.  . SitaGLIPtin-MetFORMIN HCl 50-500 MG TB24 Take 1 tablet by mouth every morning.  . VENTOLIN HFA 108 (90 Base) MCG/ACT inhaler INHALE 2 PUFFS BY MOUTH EVERY 6 HOURS AS NEEDED FOR WHEEZING OR SHORTNESS OF BREATH   No facility-administered encounter medications on file as of 12/04/2019.     Review of Systems  Constitutional: Negative for chills and fever.  Musculoskeletal: Positive for arthralgias and myalgias. Negative for back pain and gait problem.  Skin: Negative for color change and rash.  All other systems reviewed and are negative.   Observations/Objective: Patient sounds comfortable and in no acute distress  Assessment and Plan: Problem List Items Addressed This Visit    None    Visit Diagnoses    It band syndrome, left    -  Primary   Relevant Medications   predniSONE (DELTASONE) 20 MG tablet      Based on patient's description sounds like IT band, gave some instructions on how to stretch it and will try a short course of prednisone Follow Up Instructions:  Follow-up as needed, warned to watch blood sugars and not to take Voltaren at the same time.   I discussed the assessment and treatment plan with the patient. The patient was provided an opportunity to ask questions and all were answered. The patient agreed with the plan and demonstrated  an understanding of the instructions.   The patient was advised to call back or seek an in-person evaluation if the symptoms worsen or if the condition fails to improve as anticipated.  The above assessment and management plan was discussed with the patient. The patient verbalized understanding of and has agreed to the management plan. Patient is aware to call the clinic if symptoms persist or worsen. Patient is aware when to return to the clinic for a follow-up visit. Patient educated on when it is appropriate to go to the emergency  department.    I provided 10 minutes of non-face-to-face time during this encounter.    Worthy Rancher, MD

## 2019-12-24 ENCOUNTER — Telehealth: Payer: Self-pay | Admitting: Family Medicine

## 2019-12-24 NOTE — Telephone Encounter (Signed)
Pt states her left hip is hurting worse now than when she saw Dr Warrick Parisian and shooting down her leg, the prednisone helped for maybe 2 days but it is worse now. She has tried ice, heat and taking the diclofenac. She says when the pain is at it's worse it is an 8 out of 10 on the pain scale. Offered appt with another provider but pt wanted to see [facility] so scheduled her 01/09/20 which is his earliest and advised pt if the pain gets worse to go to the ED to be evaluated and pt voiced understanding.

## 2020-01-09 ENCOUNTER — Ambulatory Visit: Payer: Medicare Other | Admitting: Family Medicine

## 2020-01-22 ENCOUNTER — Other Ambulatory Visit: Payer: Self-pay

## 2020-01-23 ENCOUNTER — Ambulatory Visit (INDEPENDENT_AMBULATORY_CARE_PROVIDER_SITE_OTHER): Payer: Medicare Other | Admitting: Family Medicine

## 2020-01-23 ENCOUNTER — Encounter: Payer: Self-pay | Admitting: Family Medicine

## 2020-01-23 VITALS — BP 137/86 | HR 88 | Temp 97.2°F | Ht 65.0 in | Wt 215.2 lb

## 2020-01-23 DIAGNOSIS — E782 Mixed hyperlipidemia: Secondary | ICD-10-CM | POA: Diagnosis not present

## 2020-01-23 DIAGNOSIS — I10 Essential (primary) hypertension: Secondary | ICD-10-CM

## 2020-01-23 DIAGNOSIS — R7303 Prediabetes: Secondary | ICD-10-CM

## 2020-01-23 DIAGNOSIS — Z6835 Body mass index (BMI) 35.0-35.9, adult: Secondary | ICD-10-CM

## 2020-01-23 DIAGNOSIS — M5432 Sciatica, left side: Secondary | ICD-10-CM | POA: Diagnosis not present

## 2020-01-23 DIAGNOSIS — E559 Vitamin D deficiency, unspecified: Secondary | ICD-10-CM

## 2020-01-23 DIAGNOSIS — M1990 Unspecified osteoarthritis, unspecified site: Secondary | ICD-10-CM

## 2020-01-23 LAB — BAYER DCA HB A1C WAIVED: HB A1C (BAYER DCA - WAIVED): 6 % (ref <7.0)

## 2020-01-23 MED ORDER — BETAMETHASONE SOD PHOS & ACET 6 (3-3) MG/ML IJ SUSP
6.0000 mg | Freq: Once | INTRAMUSCULAR | Status: AC
  Administered 2020-01-23: 6 mg via INTRAMUSCULAR

## 2020-01-23 NOTE — Progress Notes (Signed)
Subjective:  Patient ID: Bethany [name],  female    DOB: Feb 23, 1951  Age: 68 y.o.    CC: No chief complaint on file.   HPI Bethany [name] presents for  follow-up of hypertension. Patient has no history of headache chest pain or shortness of breath or recent cough. Patient also denies symptoms of TIA such as numbness weakness lateralizing. Patient denies side effects from medication. States taking it regularly.  Patient also  in for follow-up of elevated cholesterol. Doing well without complaints on current medication. Denies side effects  including myalgia and arthralgia and nausea. Also in today for liver function testing. Currently no chest pain, shortness of breath or other cardiovascular related symptoms noted.  Follow-up of diabetes. Patient does not check blood sugar at home. Patient denies symptoms such as excessive hunger or urinary frequency, excessive hunger, nausea No significant hypoglycemic spells noted. Medications reviewed. Pt reports taking them regularly. Pt. denies complication/adverse reaction today.   Patient is having a lot of pain in the left hip radiating from the buttocks down the posterior thigh and leg.  Its location is posterior lateral.  The pain is severe rating 8/10 with a throbbing ache.   History Bethany [name] has a past medical history of Allergy (1997), Arthritis, GERD (gastroesophageal reflux disease), Hyperlipidemia, Hypertension, Obesity, Pneumonia, Seasonal allergies, and Sleep apnea.   She has a past surgical history that includes Abdominal hysterectomy; Tubal ligation; phlebectomy; Total knee arthroplasty (Right, 10/22/2013); Ganglion cyst excision; Breast surgery; and Total knee arthroplasty (Left, 06/11/2015).   Her family history includes Arthritis in her brother; Diabetes in her sister; Early death in her father; Heart disease in her mother and sister.She reports that she has never smoked. She has never used smokeless tobacco. She reports that she does  not drink alcohol or use drugs.  Current Outpatient Medications on File Prior to Visit  Medication Sig Dispense Refill  . acyclovir (ZOVIRAX) 200 MG capsule TAKE 1 CAPSULE EVERY DAY AS NEEDED FOR  FLAREUPS 90 capsule 0  . diclofenac (VOLTAREN) 75 MG EC tablet Take 1 tablet (75 mg total) by mouth 2 (two) times daily. For muscle and  Joint pain 180 tablet 3  . furosemide (LASIX) 20 MG tablet Take 1 tablet (20 mg total) by mouth every morning. 90 tablet 0  . meclizine (ANTIVERT) 25 MG tablet TAKE 1 TABLET THREE TIMES DAILY AS NEEDED  FOR  DIZZINESS 270 tablet 0  . metFORMIN (GLUCOPHAGE-XR) 750 MG 24 hr tablet     . olmesartan (BENICAR) 20 MG tablet Take 1 tablet (20 mg total) by mouth daily. 90 tablet 0  . pravastatin (PRAVACHOL) 40 MG tablet Take 1 tablet (40 mg total) by mouth every morning. 90 tablet 0  . clotrimazole-betamethasone (LOTRISONE) cream Apply 1 application topically 2 (two) times daily. 60 g 0  . fluticasone (FLONASE) 50 MCG/ACT nasal spray Place 2 sprays into both nostrils as needed. 16 g 11  . VENTOLIN HFA 108 (90 Base) MCG/ACT inhaler INHALE 2 PUFFS BY MOUTH EVERY 6 HOURS AS NEEDED FOR WHEEZING OR SHORTNESS OF BREATH 18 g 3   No current facility-administered medications on file prior to visit.    ROS Review of Systems  Constitutional: Negative.   HENT: Negative.   Eyes: Negative for visual disturbance.  Respiratory: Negative for shortness of breath.   Cardiovascular: Negative for chest pain.  Gastrointestinal: Negative for abdominal pain.  Musculoskeletal: Negative for arthralgias.    Objective:  BP 137/86   Pulse 88   Temp (!)  97.2 F (36.2 C) (Temporal)   Ht _0  (1.651 m)   Wt 215 lb 3.2 oz (97.6 kg)   SpO2 95%   BMI 35.81 kg/m   BP Readings from Last 3 Encounters:  01/23/20 137/86  10/24/19 (!) 148/96  11/14/18 (!) 137/95    Wt Readings from Last 3 Encounters:  01/23/20 215 lb 3.2 oz (97.6 kg)  10/24/19 223 lb (101.2 kg)  11/14/18 225 lb (102.1  kg)     Physical Exam Constitutional:      General: She is not in acute distress.    Appearance: She is well-developed.  HENT:     Head: Normocephalic and atraumatic.  Eyes:     Conjunctiva/sclera: Conjunctivae normal.     Pupils: Pupils are equal, round, and reactive to light.  Neck:     Thyroid: No thyromegaly.  Cardiovascular:     Rate and Rhythm: Normal rate and regular rhythm.     Heart sounds: Normal heart sounds. No murmur.  Pulmonary:     Effort: Pulmonary effort is normal. No respiratory distress.     Breath sounds: Normal breath sounds. No wheezing or rales.  Abdominal:     General: Bowel sounds are normal. There is no distension.     Palpations: Abdomen is soft.     Tenderness: There is no abdominal tenderness.  Musculoskeletal:        General: Normal range of motion.     Cervical back: Normal range of motion and neck supple.  Lymphadenopathy:     Cervical: No cervical adenopathy.  Skin:    General: Skin is warm and dry.  Neurological:     Mental Status: She is alert and oriented to person, place, and time.  Psychiatric:        Behavior: Behavior normal.        Thought Content: Thought content normal.        Judgment: Judgment normal.     Diabetic Foot Exam - Simple   No data filed        Assessment & Plan:   Diagnoses and all orders for this visit:  Prediabetes -     Bayer DCA Hb A1c Waived -     CBC with Differential/Platelet -     CMP14+EGFR -     Lipid panel  Arthritis -     CBC with Differential/Platelet -     CMP14+EGFR -     Lipid panel -     betamethasone acetate-betamethasone sodium phosphate (CELESTONE) injection 6 mg  Class 2 severe obesity due to excess calories with serious comorbidity and body mass index (BMI) of 35.0 to 35.9 in adult (HCC) -     CBC with Differential/Platelet -     CMP14+EGFR -     Lipid panel  Essential hypertension -     CBC with Differential/Platelet -     CMP14+EGFR -     Lipid panel  Mixed  hyperlipidemia -     CBC with Differential/Platelet -     CMP14+EGFR -     Lipid panel  Vitamin D deficiency -     VITAMIN D 25 Hydroxy (Vit-D Deficiency, Fractures)  Sciatica of left side -     betamethasone acetate-betamethasone sodium phosphate (CELESTONE) injection 6 mg   I have discontinued Bethany [name]'s SitaGLIPtin-MetFORMIN HCl and predniSONE. I am also having her maintain her fluticasone, clotrimazole-betamethasone, Ventolin HFA, acyclovir, furosemide, diclofenac, meclizine, olmesartan, pravastatin, and metFORMIN. We administered betamethasone acetate-betamethasone sodium phosphate.  Meds ordered  this encounter  Medications  . betamethasone acetate-betamethasone sodium phosphate (CELESTONE) injection 6 mg     Follow-up: Return in about 3 months (around 04/22/2020), or if symptoms worsen or fail to improve.  Claretta Fraise, M.D.

## 2020-01-24 LAB — LIPID PANEL
Chol/HDL Ratio: 3.6 ratio (ref 0.0–4.4)
Cholesterol, Total: 165 mg/dL (ref 100–199)
HDL: 46 mg/dL (ref >39)
LDL Chol Calc (NIH): 98 mg/dL (ref 0–99)
Triglycerides: 116 mg/dL (ref 0–149)
VLDL Cholesterol Cal: 21 mg/dL (ref 5–40)

## 2020-01-24 LAB — CBC WITH DIFFERENTIAL/PLATELET
Basophils Absolute: 0.1 10*3/uL (ref 0.0–0.2)
Basos: 1 %
EOS (ABSOLUTE): 0.2 10*3/uL (ref 0.0–0.4)
Eos: 2 %
Hematocrit: 40.4 % (ref 34.0–46.6)
Hemoglobin: 13.4 g/dL (ref 11.1–15.9)
Immature Grans (Abs): 0 10*3/uL (ref 0.0–0.1)
Immature Granulocytes: 0 %
Lymphocytes Absolute: 3 10*3/uL (ref 0.7–3.1)
Lymphs: 29 %
MCH: 28.8 pg (ref 26.6–33.0)
MCHC: 33.2 g/dL (ref 31.5–35.7)
MCV: 87 fL (ref 79–97)
Monocytes Absolute: 0.8 10*3/uL (ref 0.1–0.9)
Monocytes: 8 %
Neutrophils Absolute: 6.2 10*3/uL (ref 1.4–7.0)
Neutrophils: 60 %
Platelets: 285 10*3/uL (ref 150–450)
RBC: 4.65 x10E6/uL (ref 3.77–5.28)
RDW: 14.6 % (ref 11.7–15.4)
WBC: 10.2 10*3/uL (ref 3.4–10.8)

## 2020-01-24 LAB — VITAMIN D 25 HYDROXY (VIT D DEFICIENCY, FRACTURES): Vit D, 25-Hydroxy: 35.9 ng/mL (ref 30.0–100.0)

## 2020-01-24 LAB — CMP14+EGFR
ALT: 13 IU/L (ref 0–32)
AST: 17 IU/L (ref 0–40)
Albumin/Globulin Ratio: 1.8 (ref 1.2–2.2)
Albumin: 4.4 g/dL (ref 3.8–4.8)
Alkaline Phosphatase: 82 IU/L (ref 39–117)
BUN/Creatinine Ratio: 11 — ABNORMAL LOW (ref 12–28)
BUN: 10 mg/dL (ref 8–27)
Bilirubin Total: 1 mg/dL (ref 0.0–1.2)
CO2: 24 mmol/L (ref 20–29)
Calcium: 10.2 mg/dL (ref 8.7–10.3)
Chloride: 103 mmol/L (ref 96–106)
Creatinine, Ser: 0.91 mg/dL (ref 0.57–1.00)
GFR calc Af Amer: 74 mL/min/{1.73_m2} (ref >59)
GFR calc non Af Amer: 65 mL/min/{1.73_m2} (ref >59)
Globulin, Total: 2.5 g/dL (ref 1.5–4.5)
Glucose: 117 mg/dL — ABNORMAL HIGH (ref 65–99)
Potassium: 3.6 mmol/L (ref 3.5–5.2)
Sodium: 142 mmol/L (ref 134–144)
Total Protein: 6.9 g/dL (ref 6.0–8.5)

## 2020-01-25 NOTE — Progress Notes (Signed)
Hello [place],  Your lab result is normal and/or stable.Some minor variations that are not significant are commonly marked abnormal, but do not represent any medical problem for you.  Best regards, Andriel Omalley, M.D.

## 2020-01-29 ENCOUNTER — Other Ambulatory Visit: Payer: Self-pay | Admitting: Family Medicine

## 2020-01-29 DIAGNOSIS — B372 Candidiasis of skin and nail: Secondary | ICD-10-CM

## 2020-01-29 MED ORDER — ACYCLOVIR 200 MG PO CAPS: ORAL_CAPSULE | ORAL | 0 refills | Status: DC

## 2020-01-29 MED ORDER — PRAVASTATIN SODIUM 40 MG PO TABS: 40.0000 mg | ORAL_TABLET | Freq: Every morning | ORAL | 0 refills | Status: DC

## 2020-01-29 MED ORDER — MECLIZINE HCL 25 MG PO TABS: ORAL_TABLET | ORAL | 0 refills | Status: DC

## 2020-01-29 MED ORDER — FUROSEMIDE 20 MG PO TABS: 20.0000 mg | ORAL_TABLET | ORAL | 0 refills | Status: DC

## 2020-01-29 MED ORDER — METFORMIN HCL ER 750 MG PO TB24: 750.0000 mg | ORAL_TABLET | Freq: Every day | ORAL | 1 refills | Status: DC

## 2020-01-29 MED ORDER — DICLOFENAC SODIUM 75 MG PO TBEC: 75.0000 mg | DELAYED_RELEASE_TABLET | Freq: Two times a day (BID) | ORAL | 3 refills | Status: DC

## 2020-01-29 MED ORDER — OLMESARTAN MEDOXOMIL 20 MG PO TABS: 20.0000 mg | ORAL_TABLET | Freq: Every day | ORAL | 0 refills | Status: DC

## 2020-01-29 NOTE — Telephone Encounter (Signed)
Sent all other meds in not sure how you wanted patient to take metformin. Please advise.

## 2020-01-29 NOTE — Telephone Encounter (Signed)
What is the name of the medication? metFORMIN (GLUCOPHAGE-XR) 750 MG 24 hr tablet acyclovir (ZOVIRAX) 200 MG capsule diclofenac (VOLTAREN) 75 MG EC tablet  furosemide (LASIX) 20 MG tablet  meclizine (ANTIVERT) 25 MG tablet olmesartan (BENICAR) 20 MG tablet pravastatin (PRAVACHOL) 40 MG tablet    Have you contacted your pharmacy to request a refill? yes  Which pharmacy would you like this sent to? [facility] mail order pt seen last week by Dr. Livia Snellen and for got to ask for refills   Patient notified that their request is being sent to the clinical staff for review and that they should receive a call once it is complete. If they do not receive a call within 24 hours they can check with their pharmacy or our office.

## 2020-01-29 NOTE — Addendum Note (Signed)
Addended by: Brynda Peon F on: 01/29/2020 01:30 PM   Modules accepted: Orders

## 2020-02-12 DIAGNOSIS — M9904 Segmental and somatic dysfunction of sacral region: Secondary | ICD-10-CM | POA: Diagnosis not present

## 2020-02-12 DIAGNOSIS — M9901 Segmental and somatic dysfunction of cervical region: Secondary | ICD-10-CM | POA: Diagnosis not present

## 2020-02-12 DIAGNOSIS — M62830 Muscle spasm of back: Secondary | ICD-10-CM | POA: Diagnosis not present

## 2020-02-12 DIAGNOSIS — M9903 Segmental and somatic dysfunction of lumbar region: Secondary | ICD-10-CM | POA: Diagnosis not present

## 2020-02-19 ENCOUNTER — Ambulatory Visit (INDEPENDENT_AMBULATORY_CARE_PROVIDER_SITE_OTHER): Payer: Medicare Other | Admitting: Family Medicine

## 2020-02-19 ENCOUNTER — Encounter: Payer: Self-pay | Admitting: Family Medicine

## 2020-02-19 DIAGNOSIS — B370 Candidal stomatitis: Secondary | ICD-10-CM

## 2020-02-19 MED ORDER — CLOTRIMAZOLE 10 MG MT TROC: OROMUCOSAL | 0 refills | Status: DC

## 2020-02-19 NOTE — Progress Notes (Signed)
    Subjective:    Patient ID: Bethany [name], female    DOB: 08-Jan-1951, 69 y.o.   MRN: [id number]   HPI: Terence Quashie is a 69 y.o. female presenting for two days with tongue sore. Lips are sore and chapped. No change in diet. Denies antibiotic use. Has hot flashes. No fever. Tongue is white. No vesicles on lips, gums, tongue.    Depression screen [facility] 2/9 01/23/2020 10/24/2019 06/05/2019 11/14/2018 10/16/2018  Decreased Interest 0 0 0 0 0  Down, Depressed, Hopeless 0 0 0 0 0  PHQ - 2 Score 0 0 0 0 0  Altered sleeping - - - 0 -  Tired, decreased energy - - - 0 -  Change in appetite - - - 0 -  Feeling bad or failure about yourself  - - - 0 -  Trouble concentrating - - - 0 -  Moving slowly or fidgety/restless - - - 0 -  Suicidal thoughts - - - - -  PHQ-9 Score - - - 0 -  Difficult doing work/chores - - - - -     Relevant past medical, surgical, family and social history reviewed and updated as indicated.  Interim medical history since our last visit reviewed. Allergies and medications reviewed and updated.  ROS:  Review of Systems  Constitutional: Positive for appetite change. Negative for fever.  HENT: Positive for congestion and mouth sores. Negative for drooling, nosebleeds, postnasal drip and rhinorrhea.      Social History   Tobacco Use  Smoking Status Never Smoker  Smokeless Tobacco Never Used       Objective:     Wt Readings from Last 3 Encounters:  01/23/20 215 lb 3.2 oz (97.6 kg)  10/24/19 223 lb (101.2 kg)  11/14/18 225 lb (102.1 kg)     Exam deferred. Pt. Harboring due to COVID 19. Phone visit performed.   Assessment & Plan:   1. Oral thrush     Meds ordered this encounter  Medications  . clotrimazole (MYCELEX) 10 MG troche    Sig: Allow one to dissolve in the mouth 5 times daily For yeast    Dispense:  35 tablet    Refill:  0        Diagnoses and all orders for this visit:  Oral thrush  Other orders -     clotrimazole (MYCELEX) 10 MG  troche; Allow one to dissolve in the mouth 5 times daily For yeast    Virtual Visit via telephone Note  I discussed the limitations, risks, security and privacy concerns of performing an evaluation and management service by telephone and the availability of in person appointments. The patient was identified with two identifiers. Pt.expressed understanding and agreed to proceed. Pt. Is at home. Dr. Livia Snellen is in his office.  Follow Up Instructions:   I discussed the assessment and treatment plan with the patient. The patient was provided an opportunity to ask questions and all were answered. The patient agreed with the plan and demonstrated an understanding of the instructions.   The patient was advised to call back or seek an in-person evaluation if the symptoms worsen or if the condition fails to improve as anticipated.   Total minutes including chart review and phone contact time: 8   Follow up plan: Return if symptoms worsen or fail to improve.  Claretta Fraise, MD [place]

## 2020-02-26 DIAGNOSIS — Z23 Encounter for immunization: Secondary | ICD-10-CM | POA: Diagnosis not present

## 2020-03-14 ENCOUNTER — Telehealth: Payer: Self-pay | Admitting: Family Medicine

## 2020-03-14 NOTE — Chronic Care Management (AMB) (Signed)
  Chronic Care Management   Note  03/14/2020 Name: Bethany [name] MRN: [id number] DOB: Aug 08, 1951  Bethany [name] is a 69 y.o. year old female who is a primary care patient of [facility], Cletus Gash, MD. I reached out to Bethany [name] by phone today in response to a referral sent by Bethany [name]'s health plan.     Bethany [name] was given information about Chronic Care Management services today including:  1. CCM service includes personalized support from designated clinical staff supervised by her physician, including individualized plan of care and coordination with other care providers 2. 24/7 contact phone numbers for assistance for urgent and routine care needs. 3. Service will only be billed when office clinical staff spend 20 minutes or more in a month to coordinate care. 4. Only one practitioner may furnish and bill the service in a calendar month. 5. The patient may stop CCM services at any time (effective at the end of the month) by phone call to the office staff. 6. The patient will be responsible for cost sharing (co-pay) of up to 20% of the service fee (after annual deductible is met).  Patient agreed to services and verbal consent obtained.   Follow up plan: Telephone appointment with care management team member scheduled for:08/18/2020.  [place], [place] [address] Direct Dial: [phone] [email or web address]

## 2020-03-24 DIAGNOSIS — M9904 Segmental and somatic dysfunction of sacral region: Secondary | ICD-10-CM | POA: Diagnosis not present

## 2020-03-24 DIAGNOSIS — M62830 Muscle spasm of back: Secondary | ICD-10-CM | POA: Diagnosis not present

## 2020-03-24 DIAGNOSIS — M9901 Segmental and somatic dysfunction of cervical region: Secondary | ICD-10-CM | POA: Diagnosis not present

## 2020-03-24 DIAGNOSIS — M9903 Segmental and somatic dysfunction of lumbar region: Secondary | ICD-10-CM | POA: Diagnosis not present

## 2020-03-25 DIAGNOSIS — Z23 Encounter for immunization: Secondary | ICD-10-CM | POA: Diagnosis not present

## 2020-04-23 ENCOUNTER — Ambulatory Visit: Payer: Medicare Other | Admitting: Family Medicine

## 2020-05-20 DIAGNOSIS — M9904 Segmental and somatic dysfunction of sacral region: Secondary | ICD-10-CM | POA: Diagnosis not present

## 2020-05-20 DIAGNOSIS — M9903 Segmental and somatic dysfunction of lumbar region: Secondary | ICD-10-CM | POA: Diagnosis not present

## 2020-05-20 DIAGNOSIS — M9901 Segmental and somatic dysfunction of cervical region: Secondary | ICD-10-CM | POA: Diagnosis not present

## 2020-05-20 DIAGNOSIS — M62830 Muscle spasm of back: Secondary | ICD-10-CM | POA: Diagnosis not present

## 2020-06-06 ENCOUNTER — Ambulatory Visit (INDEPENDENT_AMBULATORY_CARE_PROVIDER_SITE_OTHER): Payer: Medicare Other

## 2020-06-06 DIAGNOSIS — Z0000 Encounter for general adult medical examination without abnormal findings: Secondary | ICD-10-CM

## 2020-06-06 NOTE — Patient Instructions (Signed)
  Bethany [name] , Thank you for taking time to come for your Medicare Wellness Visit. I appreciate your ongoing commitment to your health goals. Please review the following plan we discussed and let me know if I can assist you in the future.   These are the goals we discussed: Goals    . Cut out extra servings    . Exercise 150 min/wk Moderate Activity    . Increase lean proteins    . Increase physical activity    . Plan meals     Once per week plan menu for the rest of week - include mostly healthy food options.     . Weight (lb) < 200 lb (90.7 kg)       This is a list of the screening recommended for you and due dates:  Health Maintenance  Topic Date Due  . Colon Cancer Screening  10/23/2020*  . Flu Shot  07/27/2020  . Mammogram  07/23/2021  . Tetanus Vaccine  09/03/2026  . DEXA scan (bone density measurement)  Completed  . COVID-19 Vaccine  Completed  .  Hepatitis C: One time screening is recommended by Center for Disease Control  (CDC) for  adults born from [birth year] through 1966.   Completed  . Pneumonia vaccines  Completed  *Topic was postponed. The date shown is not the original due date.

## 2020-06-06 NOTE — Progress Notes (Signed)
MEDICARE ANNUAL WELLNESS VISIT  06/06/2020  Telephone Visit Disclaimer This Medicare AWV was conducted by telephone due to national recommendations for restrictions regarding the COVID-19 Pandemic (e.g. social distancing).  I verified, using two identifiers, that I am speaking with Bethany [name] or their authorized healthcare agent. I discussed the limitations, risks, security, and privacy concerns of performing an evaluation and management service by telephone and the potential availability of an in-person appointment in the future. The patient expressed understanding and agreed to proceed.   Subjective:  Bethany [name] is a 69 y.o. female patient of Stacks, Cletus Gash, MD who had a Medicare Annual Wellness Visit today via telephone. Shatha is a very pleasant patient who lives in nearby [place] with her husband. She is retired from [employer] and she enjoys working word puzzles, reading, and sewing. Her and her husband have 4 children, one that has passed away. All their kids live nearby and they are in contact with them on a regular basis. She doesn't have a regular exercise routine but her and her husband do walk very often. She states her health is better than it was this time last year and her only complaint is some leg pain and arthritis. Her health maintenance is all up to date.   Patient Care Team: Claretta Fraise, MD as PCP - General (Family Medicine) Gaynelle Arabian, MD as Consulting Physician (Orthopedic Surgery) Ilean China, RN as Registered Nurse  Advanced Directives 06/06/2020 06/05/2019 06/08/2018 06/27/2015 06/25/2015 06/11/2015 06/11/2015  Does Patient Have a Medical Advance Directive? Yes Yes Yes No Yes Yes Yes  Type of Advance Directive Living will [place];Living will - - Healthcare Power of Attorney;Mental [place];Living will [place];Living will  Does patient want to make changes to medical advance  directive? No - Patient declined No - Patient declined - - - No - Patient declined -  Copy of [place] in Chart? - No - copy requested - - - Yes Yes  Pre-existing out of facility DNR order (yellow form or pink MOST form) - - - - - - -    Hospital Utilization Over the Past 12 Months: # of hospitalizations or ER visits: 0 # of surgeries: 0  Review of Systems    Patient reports that her overall health is better compared to last year.  History obtained from chart review  Patient Reported Readings (BP, Pulse, CBG, Weight, etc) none  Pain Assessment Pain : 0-10 Pain Type: Chronic pain Pain Radiating Towards: Chronic arthritis pain all over     Current Medications & Allergies (verified) Allergies as of 06/06/2020      Reactions   Hctz [hydrochlorothiazide]    Hair loss      Medication List       Accurate as of June 06, 2020  9:12 AM. If you have any questions, ask your nurse or doctor.        acyclovir 200 MG capsule Commonly known as: ZOVIRAX TAKE 1 CAPSULE EVERY DAY AS NEEDED FOR  FLAREUPS   clotrimazole 10 MG troche Commonly known as: MYCELEX Allow one to dissolve in the mouth 5 times daily For yeast   clotrimazole-betamethasone cream Commonly known as: LOTRISONE Apply 1 application topically 2 (two) times daily.   diclofenac 75 MG EC tablet Commonly known as: VOLTAREN Take 1 tablet (75 mg total) by mouth 2 (two) times daily. For muscle and  Joint pain   fluticasone 50 MCG/ACT  nasal spray Commonly known as: FLONASE Place 2 sprays into both nostrils as needed.   furosemide 20 MG tablet Commonly known as: LASIX Take 1 tablet (20 mg total) by mouth every morning.   meclizine 25 MG tablet Commonly known as: ANTIVERT TAKE 1 TABLET THREE TIMES DAILY AS NEEDED  FOR  DIZZINESS   metFORMIN 750 MG 24 hr tablet Commonly known as: GLUCOPHAGE-XR Take 1 tablet (750 mg total) by mouth daily with breakfast.   olmesartan 20 MG tablet Commonly known  as: BENICAR Take 1 tablet (20 mg total) by mouth daily.   pravastatin 40 MG tablet Commonly known as: PRAVACHOL Take 1 tablet (40 mg total) by mouth every morning.   Ventolin HFA 108 (90 Base) MCG/ACT inhaler Generic drug: albuterol INHALE 2 PUFFS BY MOUTH EVERY 6 HOURS AS NEEDED FOR WHEEZING OR SHORTNESS OF BREATH       History (reviewed): Past Medical History:  Diagnosis Date  . Allergy 1997   knees  . Arthritis   . GERD (gastroesophageal reflux disease)   . Hyperlipidemia   . Hypertension   . Obesity   . Pneumonia    1 month ago - rsolved  . Seasonal allergies   . Sleep apnea    cpap   Past Surgical History:  Procedure Laterality Date  . ABDOMINAL HYSTERECTOMY    . BREAST SURGERY     fibroid cysts removed  . GANGLION CYST EXCISION     left  . phlebectomy    . TOTAL KNEE ARTHROPLASTY Right 10/22/2013   Procedure: RIGHT TOTAL KNEE ARTHROPLASTY;  Surgeon: Gearlean Alf, MD;  Location: [facility];  Service: Orthopedics;  Laterality: Right;  . TOTAL KNEE ARTHROPLASTY Left 06/11/2015   Procedure: LEFT TOTAL KNEE ARTHROPLASTY;  Surgeon: Gaynelle Arabian, MD;  Location: [facility];  Service: Orthopedics;  Laterality: Left;  . TUBAL LIGATION     Family History  Problem Relation Age of Onset  . Heart disease Mother   . Early death Father        Shooting  . Diabetes Sister   . Arthritis Brother        knees  . Heart disease Sister        tumor in the heart   Social History   Socioeconomic History  . Marital status: Married    Spouse name: Not on file  . Number of children: Not on file  . Years of education: 52  . Highest education level: High school graduate  Occupational History  . Occupation: retired    Fish farm manager: [employer]  Tobacco Use  . Smoking status: Never Smoker  . Smokeless tobacco: Never Used  Vaping Use  . Vaping Use: Never used  Substance and Sexual Activity  . Alcohol use: No  . Drug use: No  . Sexual activity: Not Currently    Comment: husband not  able to have  sex  Other Topics Concern  . Not on file  Social History Narrative  . Not on file   Social Determinants of Health   Financial Resource Strain:   . Difficulty of Paying Living Expenses:   Food Insecurity:   . Worried About Charity fundraiser in the Last Year:   . Arboriculturist in the Last Year:   Transportation Needs:   . Film/video editor (Medical):   Marland Kitchen Lack of Transportation (Non-Medical):   Physical Activity:   . Days of Exercise per Week:   . Minutes of Exercise per Session:  Stress:   . Feeling of Stress :   Social Connections:   . Frequency of Communication with Friends and Family:   . Frequency of Social Gatherings with Friends and Family:   . Attends Religious Services:   . Active Member of Clubs or Organizations:   . Attends Archivist Meetings:   Marland Kitchen Marital Status:     Activities of Daily Living In your present state of health, do you have any difficulty performing the following activities: 06/06/2020  Hearing? N  Vision? Y  Comment Wears glasses all the time  Difficulty concentrating or making decisions? N  Walking or climbing stairs? Y  Comment Left leg gives her some trouble  Dressing or bathing? N  Doing errands, shopping? N  Preparing Food and eating ? N  Using the Toilet? N  In the past six months, have you accidently leaked urine? N  Do you have problems with loss of bowel control? N  Managing your Medications? N  Managing your Finances? N  Housekeeping or managing your Housekeeping? N  Some recent data might be hidden    Patient Education/ Literacy How often do you need to have someone help you when you read instructions, pamphlets, or other written materials from your doctor or pharmacy?: 1 - Never What is the last grade level you completed in school?: 12th grade  Exercise Current Exercise Habits: The patient does not participate in regular exercise at present, Exercise limited by: orthopedic  condition(s)  Diet Patient reports consuming 3 meals a day and 2 snack(s) a day Patient reports that her primary diet is: Low fat, Diabetic Patient reports that she does have regular access to food.   Depression Screen PHQ 2/9 Scores 06/06/2020 01/23/2020 10/24/2019 06/05/2019 11/14/2018 10/16/2018 04/12/2018  PHQ - 2 Score 0 0 0 0 0 0 0  PHQ- 9 Score - - - - 0 - -     Fall Risk Fall Risk  06/06/2020 01/23/2020 10/24/2019 06/05/2019 11/14/2018  Falls in the past year? 0 0 0 0 0  Follow up - - - Falls prevention discussed -     Objective:  Jerelene Salaam seemed alert and oriented and she participated appropriately during our telephone visit.  Blood Pressure Weight BMI  BP Readings from Last 3 Encounters:  01/23/20 137/86  10/24/19 (!) 148/96  11/14/18 (!) 137/95   Wt Readings from Last 3 Encounters:  01/23/20 215 lb 3.2 oz (97.6 kg)  10/24/19 223 lb (101.2 kg)  11/14/18 225 lb (102.1 kg)   BMI Readings from Last 1 Encounters:  01/23/20 35.81 kg/m    *Unable to obtain current vital signs, weight, and BMI due to telephone visit type  Hearing/Vision  . Sibbie did not seem to have difficulty with hearing/understanding during the telephone conversation . Reports that she has had a formal eye exam by an eye care professional within the past year . Reports that she has not had a formal hearing evaluation within the past year *Unable to fully assess hearing and vision during telephone visit type  Cognitive Function: 6CIT Screen 06/06/2020 06/05/2019  What Year? 0 points 0 points  What month? 0 points 0 points  What time? 0 points 0 points  Count back from 20 0 points 0 points  Months in reverse 0 points 0 points  Repeat phrase 0 points 0 points  Total Score 0 0   (Normal:0-7, Significant for Dysfunction: >8)  Normal Cognitive Function Screening: Yes   Immunization & Health Maintenance Record Immunization  History  Administered Date(s) Administered  . Fluad Quad(high Dose 65+)  10/24/2019  . Influenza, High Dose Seasonal PF 10/08/2016, 09/21/2017, 10/02/2018  . Influenza,inj,Quad PF,6+ Mos 09/17/2013, 10/01/2015  . Influenza-Unspecified 09/26/2014  . Moderna SARS-COVID-2 Vaccination 02/26/2020, 03/25/2020  . Pneumococcal Conjugate-13 10/01/2015  . Pneumococcal Polysaccharide-23 09/21/2017  . Tdap 09/03/2016    Health Maintenance  Topic Date Due  . COLONOSCOPY  10/23/2020 (Originally 03/12/2000)  . INFLUENZA VACCINE  07/27/2020  . MAMMOGRAM  07/23/2021  . TETANUS/TDAP  09/03/2026  . DEXA SCAN  Completed  . COVID-19 Vaccine  Completed  . Hepatitis C Screening  Completed  . PNA vac Low Risk Adult  Completed       Assessment  This is a routine wellness examination for Jaryah Aracena.  Health Maintenance: Due or Overdue There are no preventive care reminders to display for this patient.  Lonita Debes does not need a referral for Community Assistance: Care Management:   no Social Work:    no Prescription Assistance:  no Nutrition/Diabetes Education:  no   Plan:  Personalized Goals Goals Addressed            This Visit's Progress   . Exercise 150 min/wk Moderate Activity        Personalized Health Maintenance & Screening Recommendations  Patient is up to date on all health maintenace  Lung Cancer Screening Recommended: no (Low Dose CT Chest recommended if Age 64-80 years, 30 pack-year currently smoking OR have quit w/in past 15 years) Hepatitis C Screening recommended: Completed on 09-03-16 HIV Screening recommended: no  Advanced Directives: Written information was not prepared per patient's request.  Referrals & Orders No orders of the defined types were placed in this encounter.   Follow-up Plan . Follow-up with Claretta Fraise, MD as planned    I have personally reviewed and noted the following in the patient's chart:   . Medical and social history . Use of alcohol, tobacco or illicit drugs  . Current medications and  supplements . Functional ability and status . Nutritional status . Physical activity . Advanced directives . List of other physicians . Hospitalizations, surgeries, and ER visits in previous 12 months . Vitals . Screenings to include cognitive, depression, and falls . Referrals and appointments  In addition, I have reviewed and discussed with Bethany [name] certain preventive protocols, quality metrics, and best practice recommendations. A written personalized care plan for preventive services as well as general preventive health recommendations is available and can be mailed to the patient at her request.      Rolena Infante LPN 02/21/3334

## 2020-07-02 DIAGNOSIS — M25512 Pain in left shoulder: Secondary | ICD-10-CM | POA: Diagnosis not present

## 2020-07-22 ENCOUNTER — Encounter: Payer: Self-pay | Admitting: Family Medicine

## 2020-07-22 ENCOUNTER — Other Ambulatory Visit: Payer: Self-pay

## 2020-07-22 ENCOUNTER — Ambulatory Visit (INDEPENDENT_AMBULATORY_CARE_PROVIDER_SITE_OTHER): Payer: Medicare Other | Admitting: Family Medicine

## 2020-07-22 VITALS — BP 126/82 | HR 58 | Temp 97.3°F | Ht 65.0 in | Wt 203.2 lb

## 2020-07-22 DIAGNOSIS — E559 Vitamin D deficiency, unspecified: Secondary | ICD-10-CM

## 2020-07-22 DIAGNOSIS — M1990 Unspecified osteoarthritis, unspecified site: Secondary | ICD-10-CM | POA: Diagnosis not present

## 2020-07-22 DIAGNOSIS — Z6835 Body mass index (BMI) 35.0-35.9, adult: Secondary | ICD-10-CM | POA: Diagnosis not present

## 2020-07-22 DIAGNOSIS — H532 Diplopia: Secondary | ICD-10-CM | POA: Diagnosis not present

## 2020-07-22 DIAGNOSIS — R7303 Prediabetes: Secondary | ICD-10-CM

## 2020-07-22 DIAGNOSIS — E782 Mixed hyperlipidemia: Secondary | ICD-10-CM

## 2020-07-22 DIAGNOSIS — W19XXXA Unspecified fall, initial encounter: Secondary | ICD-10-CM

## 2020-07-22 DIAGNOSIS — I10 Essential (primary) hypertension: Secondary | ICD-10-CM

## 2020-07-22 LAB — BAYER DCA HB A1C WAIVED: HB A1C (BAYER DCA - WAIVED): 5.8 % (ref <7.0)

## 2020-07-22 NOTE — Patient Instructions (Signed)
Decrease the Lasix/furosemide to 1/2 tablet daily which is 10 mg/day.  Do this until the weather starts going off for the end of September.  You also need to drink 64 ounces of water minimum, but if you are exerting yourself outside that should be increased to 90 ounces a day.  If swelling occurs contact my office.

## 2020-07-22 NOTE — Progress Notes (Signed)
Subjective:  Patient ID: Bethany [name], female    DOB: 1951/12/08  Age: 69 y.o. MRN: [id number]  CC: Follow-up (6 month)   HPI Bethany [name] presents for  follow-up of hypertension. Patient has no history of headache chest pain or shortness of breath or recent cough. Patient also denies symptoms of TIA such as focal numbness or weakness. Patient denies side effects from medication. States taking it regularly. Patient reports that on 2 occasions she is falling.  On each occasion there was no stimulus such as a stumbling or event.  She just a couple months ago fell backwards nothing precipitated it.  She did not even realize she was falling.  Then 3 weeks ago she fell again she again was just walking and had no stumble.  She did not even feel dizzy ahead of time.  She just fell over on her left side bruising her cheek and shoulder.  Those are healing well now but she does have double vision for leftward gaze ever since the fall.   History Bethany [name] has a past medical history of Allergy (1997), Arthritis, GERD (gastroesophageal reflux disease), Hyperlipidemia, Hypertension, Obesity, Pneumonia, Seasonal allergies, and Sleep apnea.   She has a past surgical history that includes Abdominal hysterectomy; Tubal ligation; phlebectomy; Total knee arthroplasty (Right, 10/22/2013); Ganglion cyst excision; Breast surgery; and Total knee arthroplasty (Left, 06/11/2015).   Her family history includes Arthritis in her brother; Diabetes in her sister; Early death in her father; Heart disease in her mother and sister.She reports that she has never smoked. She has never used smokeless tobacco. She reports that she does not drink alcohol and does not use drugs.  Current Outpatient Medications on File Prior to Visit  Medication Sig Dispense Refill  . acyclovir (ZOVIRAX) 200 MG capsule TAKE 1 CAPSULE EVERY DAY AS NEEDED FOR  FLAREUPS 90 capsule 0  . clotrimazole (MYCELEX) 10 MG troche Allow one to dissolve in the  mouth 5 times daily For yeast 35 tablet 0  . clotrimazole-betamethasone (LOTRISONE) cream Apply 1 application topically 2 (two) times daily. 60 g 0  . diclofenac (VOLTAREN) 75 MG EC tablet Take 1 tablet (75 mg total) by mouth 2 (two) times daily. For muscle and  Joint pain 180 tablet 3  . fluticasone (FLONASE) 50 MCG/ACT nasal spray Place 2 sprays into both nostrils as needed. 16 g 11  . furosemide (LASIX) 20 MG tablet Take 1 tablet (20 mg total) by mouth every morning. 90 tablet 0  . meclizine (ANTIVERT) 25 MG tablet TAKE 1 TABLET THREE TIMES DAILY AS NEEDED  FOR  DIZZINESS 270 tablet 0  . metFORMIN (GLUCOPHAGE-XR) 750 MG 24 hr tablet Take 1 tablet (750 mg total) by mouth daily with breakfast. 90 tablet 1  . olmesartan (BENICAR) 20 MG tablet Take 1 tablet (20 mg total) by mouth daily. 90 tablet 0  . pravastatin (PRAVACHOL) 40 MG tablet Take 1 tablet (40 mg total) by mouth every morning. 90 tablet 0  . VENTOLIN HFA 108 (90 Base) MCG/ACT inhaler INHALE 2 PUFFS BY MOUTH EVERY 6 HOURS AS NEEDED FOR WHEEZING OR SHORTNESS OF BREATH 18 g 3   No current facility-administered medications on file prior to visit.    ROS Review of Systems  Constitutional: Negative.   HENT: Negative for congestion.   Eyes: Negative for visual disturbance.  Respiratory: Negative for shortness of breath.   Cardiovascular: Negative for chest pain.  Gastrointestinal: Negative for abdominal pain, constipation, diarrhea, nausea and vomiting.  Genitourinary: Negative for  difficulty urinating.  Musculoskeletal: Negative for arthralgias and myalgias.  Neurological: Negative for headaches.  Psychiatric/Behavioral: Negative for sleep disturbance.    Objective:  BP 126/82   Pulse 58   Temp (!) 97.3 F (36.3 C) (Temporal)   Ht 5' 5"  (1.651 m)   Wt (!) 203 lb 3.2 oz (92.2 kg)   BMI 33.81 kg/m   BP Readings from Last 3 Encounters:  07/22/20 126/82  01/23/20 137/86  10/24/19 (!) 148/96    Wt Readings from Last 3  Encounters:  07/22/20 (!) 203 lb 3.2 oz (92.2 kg)  01/23/20 215 lb 3.2 oz (97.6 kg)  10/24/19 223 lb (101.2 kg)     Physical Exam Constitutional:      General: She is not in acute distress.    Appearance: She is well-developed.  HENT:     Head: Normocephalic and atraumatic.  Eyes:     Extraocular Movements: Extraocular movements intact.     Conjunctiva/sclera: Conjunctivae normal.     Pupils: Pupils are equal, round, and reactive to light.  Neck:     Thyroid: No thyromegaly.  Cardiovascular:     Rate and Rhythm: Normal rate and regular rhythm.     Heart sounds: Normal heart sounds. No murmur heard.   Pulmonary:     Effort: Pulmonary effort is normal. No respiratory distress.     Breath sounds: Normal breath sounds. No wheezing or rales.  Abdominal:     General: Bowel sounds are normal. There is no distension.     Palpations: Abdomen is soft.     Tenderness: There is no abdominal tenderness.  Musculoskeletal:        General: Normal range of motion.     Cervical back: Normal range of motion and neck supple.  Lymphadenopathy:     Cervical: No cervical adenopathy.  Skin:    General: Skin is warm and dry.  Neurological:     Mental Status: She is alert and oriented to person, place, and time.  Psychiatric:        Behavior: Behavior normal.        Thought Content: Thought content normal.        Judgment: Judgment normal.       Assessment & Plan:   Bethany [name] was seen today for follow-up.  Diagnoses and all orders for this visit:  Class 2 severe obesity due to excess calories with serious comorbidity and body mass index (BMI) of 35.0 to 35.9 in adult (HCC) -     CBC with Differential/Platelet -     CMP14+EGFR -     Urinalysis -     TSH  Prediabetes -     CBC with Differential/Platelet -     CMP14+EGFR -     Bayer DCA Hb A1c Waived -     Urinalysis -     TSH  Essential hypertension -     CBC with Differential/Platelet -     CMP14+EGFR -     Urinalysis -      TSH  Mixed hyperlipidemia -     CBC with Differential/Platelet -     CMP14+EGFR -     Lipid panel -     Urinalysis -     TSH  Vitamin D deficiency -     CBC with Differential/Platelet -     CMP14+EGFR -     VITAMIN D 25 Hydroxy (Vit-D Deficiency, Fractures) -     Urinalysis -     TSH  Arthritis -  Urinalysis -     TSH  Fall, initial encounter -     Urinalysis -     TSH  Diplopia -     Ambulatory referral to Ophthalmology   Allergies as of 07/22/2020      Reactions   Hctz [hydrochlorothiazide]    Hair loss      Medication List       Accurate as of July 22, 2020  9:01 AM. If you have any questions, ask your nurse or doctor.        acyclovir 200 MG capsule Commonly known as: ZOVIRAX TAKE 1 CAPSULE EVERY DAY AS NEEDED FOR  FLAREUPS   clotrimazole 10 MG troche Commonly known as: MYCELEX Allow one to dissolve in the mouth 5 times daily For yeast   clotrimazole-betamethasone cream Commonly known as: LOTRISONE Apply 1 application topically 2 (two) times daily.   diclofenac 75 MG EC tablet Commonly known as: VOLTAREN Take 1 tablet (75 mg total) by mouth 2 (two) times daily. For muscle and  Joint pain   fluticasone 50 MCG/ACT nasal spray Commonly known as: FLONASE Place 2 sprays into both nostrils as needed.   furosemide 20 MG tablet Commonly known as: LASIX Take 1 tablet (20 mg total) by mouth every morning.   meclizine 25 MG tablet Commonly known as: ANTIVERT TAKE 1 TABLET THREE TIMES DAILY AS NEEDED  FOR  DIZZINESS   metFORMIN 750 MG 24 hr tablet Commonly known as: GLUCOPHAGE-XR Take 1 tablet (750 mg total) by mouth daily with breakfast.   olmesartan 20 MG tablet Commonly known as: BENICAR Take 1 tablet (20 mg total) by mouth daily.   pravastatin 40 MG tablet Commonly known as: PRAVACHOL Take 1 tablet (40 mg total) by mouth every morning.   Ventolin HFA 108 (90 Base) MCG/ACT inhaler Generic drug: albuterol INHALE 2 PUFFS BY MOUTH EVERY 6  HOURS AS NEEDED FOR WHEEZING OR SHORTNESS OF BREATH       No orders of the defined types were placed in this encounter.      Follow-up: Return in about 6 months (around 01/22/2021), or if symptoms worsen or fail to improve, & if you fall again.  Bethany [name], M.D.

## 2020-07-23 ENCOUNTER — Other Ambulatory Visit: Payer: Medicare Other

## 2020-07-23 DIAGNOSIS — R7303 Prediabetes: Secondary | ICD-10-CM | POA: Diagnosis not present

## 2020-07-23 DIAGNOSIS — W19XXXA Unspecified fall, initial encounter: Secondary | ICD-10-CM | POA: Diagnosis not present

## 2020-07-23 DIAGNOSIS — Z6835 Body mass index (BMI) 35.0-35.9, adult: Secondary | ICD-10-CM | POA: Diagnosis not present

## 2020-07-23 DIAGNOSIS — I10 Essential (primary) hypertension: Secondary | ICD-10-CM | POA: Diagnosis not present

## 2020-07-23 DIAGNOSIS — E782 Mixed hyperlipidemia: Secondary | ICD-10-CM | POA: Diagnosis not present

## 2020-07-23 DIAGNOSIS — M1990 Unspecified osteoarthritis, unspecified site: Secondary | ICD-10-CM | POA: Diagnosis not present

## 2020-07-23 DIAGNOSIS — E559 Vitamin D deficiency, unspecified: Secondary | ICD-10-CM | POA: Diagnosis not present

## 2020-07-23 LAB — CBC WITH DIFFERENTIAL/PLATELET
Basophils Absolute: 0.1 10*3/uL (ref 0.0–0.2)
Basos: 1 %
EOS (ABSOLUTE): 0.1 10*3/uL (ref 0.0–0.4)
Eos: 1 %
Hematocrit: 38.7 % (ref 34.0–46.6)
Hemoglobin: 12.5 g/dL (ref 11.1–15.9)
Immature Grans (Abs): 0 10*3/uL (ref 0.0–0.1)
Immature Granulocytes: 0 %
Lymphocytes Absolute: 2 10*3/uL (ref 0.7–3.1)
Lymphs: 27 %
MCH: 28.7 pg (ref 26.6–33.0)
MCHC: 32.3 g/dL (ref 31.5–35.7)
MCV: 89 fL (ref 79–97)
Monocytes Absolute: 0.7 10*3/uL (ref 0.1–0.9)
Monocytes: 9 %
Neutrophils Absolute: 4.6 10*3/uL (ref 1.4–7.0)
Neutrophils: 62 %
Platelets: 274 10*3/uL (ref 150–450)
RBC: 4.36 x10E6/uL (ref 3.77–5.28)
RDW: 13.6 % (ref 11.7–15.4)
WBC: 7.5 10*3/uL (ref 3.4–10.8)

## 2020-07-23 LAB — URINALYSIS
Bilirubin, UA: NEGATIVE
Glucose, UA: NEGATIVE
Ketones, UA: NEGATIVE
Leukocytes,UA: NEGATIVE
Nitrite, UA: NEGATIVE
Protein,UA: NEGATIVE
RBC, UA: NEGATIVE
Specific Gravity, UA: 1.02 (ref 1.005–1.030)
Urobilinogen, Ur: 1 mg/dL (ref 0.2–1.0)
pH, UA: 6 (ref 5.0–7.5)

## 2020-07-23 LAB — LIPID PANEL
Chol/HDL Ratio: 3.8 ratio (ref 0.0–4.4)
Cholesterol, Total: 169 mg/dL (ref 100–199)
HDL: 44 mg/dL (ref >39)
LDL Chol Calc (NIH): 106 mg/dL — ABNORMAL HIGH (ref 0–99)
Triglycerides: 103 mg/dL (ref 0–149)
VLDL Cholesterol Cal: 19 mg/dL (ref 5–40)

## 2020-07-23 LAB — VITAMIN D 25 HYDROXY (VIT D DEFICIENCY, FRACTURES): Vit D, 25-Hydroxy: 30.6 ng/mL (ref 30.0–100.0)

## 2020-07-23 LAB — CMP14+EGFR
ALT: 10 IU/L (ref 0–32)
AST: 17 IU/L (ref 0–40)
Albumin/Globulin Ratio: 1.4 (ref 1.2–2.2)
Albumin: 4.2 g/dL (ref 3.8–4.8)
Alkaline Phosphatase: 77 IU/L (ref 48–121)
BUN/Creatinine Ratio: 14 (ref 12–28)
BUN: 11 mg/dL (ref 8–27)
Bilirubin Total: 0.8 mg/dL (ref 0.0–1.2)
CO2: 24 mmol/L (ref 20–29)
Calcium: 9.8 mg/dL (ref 8.7–10.3)
Chloride: 108 mmol/L — ABNORMAL HIGH (ref 96–106)
Creatinine, Ser: 0.81 mg/dL (ref 0.57–1.00)
GFR calc Af Amer: 85 mL/min/{1.73_m2} (ref >59)
GFR calc non Af Amer: 74 mL/min/{1.73_m2} (ref >59)
Globulin, Total: 2.9 g/dL (ref 1.5–4.5)
Glucose: 87 mg/dL (ref 65–99)
Potassium: 4 mmol/L (ref 3.5–5.2)
Sodium: 143 mmol/L (ref 134–144)
Total Protein: 7.1 g/dL (ref 6.0–8.5)

## 2020-07-23 LAB — TSH: TSH: 2.43 u[IU]/mL (ref 0.450–4.500)

## 2020-07-23 NOTE — Progress Notes (Signed)
Hello Anthony,  Your lab result is normal and/or stable.Some minor variations that are not significant are commonly marked abnormal, but do not represent any medical problem for you.  Best regards, Jaran Sainz, M.D.

## 2020-08-01 DIAGNOSIS — H4902 Third [oculomotor] nerve palsy, left eye: Secondary | ICD-10-CM | POA: Diagnosis not present

## 2020-08-02 DIAGNOSIS — R457 State of emotional shock and stress, unspecified: Secondary | ICD-10-CM | POA: Diagnosis not present

## 2020-08-02 DIAGNOSIS — R42 Dizziness and giddiness: Secondary | ICD-10-CM | POA: Diagnosis not present

## 2020-08-02 DIAGNOSIS — R61 Generalized hyperhidrosis: Secondary | ICD-10-CM | POA: Diagnosis not present

## 2020-08-02 DIAGNOSIS — R55 Syncope and collapse: Secondary | ICD-10-CM | POA: Diagnosis not present

## 2020-08-08 ENCOUNTER — Other Ambulatory Visit: Payer: Self-pay | Admitting: Ophthalmology

## 2020-08-11 ENCOUNTER — Other Ambulatory Visit: Payer: Self-pay | Admitting: *Deleted

## 2020-08-11 DIAGNOSIS — B372 Candidiasis of skin and nail: Secondary | ICD-10-CM

## 2020-08-11 DIAGNOSIS — J301 Allergic rhinitis due to pollen: Secondary | ICD-10-CM

## 2020-08-11 MED ORDER — OLMESARTAN MEDOXOMIL 20 MG PO TABS: 20.0000 mg | ORAL_TABLET | Freq: Every day | ORAL | 1 refills | Status: DC

## 2020-08-11 MED ORDER — ACYCLOVIR 200 MG PO CAPS: ORAL_CAPSULE | ORAL | 1 refills | Status: DC

## 2020-08-11 MED ORDER — FLUTICASONE PROPIONATE 50 MCG/ACT NA SUSP: 2.0000 | NASAL | 1 refills | Status: DC | PRN

## 2020-08-11 MED ORDER — CLOTRIMAZOLE-BETAMETHASONE 1-0.05 % EX CREA: 1.0000 "application " | TOPICAL_CREAM | Freq: Two times a day (BID) | CUTANEOUS | 0 refills | Status: DC

## 2020-08-11 MED ORDER — PRAVASTATIN SODIUM 40 MG PO TABS: 40.0000 mg | ORAL_TABLET | Freq: Every morning | ORAL | 1 refills | Status: DC

## 2020-08-11 MED ORDER — VENTOLIN HFA 108 (90 BASE) MCG/ACT IN AERS: INHALATION_SPRAY | RESPIRATORY_TRACT | 3 refills | Status: DC

## 2020-08-11 MED ORDER — MECLIZINE HCL 25 MG PO TABS: ORAL_TABLET | ORAL | 0 refills | Status: DC

## 2020-08-11 MED ORDER — CLOTRIMAZOLE 10 MG MT TROC: OROMUCOSAL | 0 refills | Status: DC

## 2020-08-18 ENCOUNTER — Ambulatory Visit (INDEPENDENT_AMBULATORY_CARE_PROVIDER_SITE_OTHER): Payer: Medicare Other | Admitting: *Deleted

## 2020-08-18 DIAGNOSIS — E782 Mixed hyperlipidemia: Secondary | ICD-10-CM | POA: Diagnosis not present

## 2020-08-18 DIAGNOSIS — R7303 Prediabetes: Secondary | ICD-10-CM

## 2020-08-18 DIAGNOSIS — I10 Essential (primary) hypertension: Secondary | ICD-10-CM

## 2020-08-18 NOTE — Chronic Care Management (AMB) (Signed)
Chronic Care Management   Initial Visit Note  08/18/2020 Name: Bethany [name] MRN: [id number] DOB: [birth year]  Referred by: Claretta Fraise, MD Reason for referral : Chronic Care Management (Initial Visit)   Bethany [name] is a 69 y.o. year old female who is a primary care patient of Stacks, Cletus Gash, MD. The CCM team was consulted for assistance with chronic disease management and care coordination needs related to HTN, Sleep apnea, OA, HLD.  Review of patient status, including review of consultants reports, relevant laboratory and other test results, and collaboration with appropriate care team members and the patient's provider was performed as part of comprehensive patient evaluation and provision of chronic care management services.    Subjective: I spoke with [name] by telephone today regarding management of her chronic medical conditions.   SDOH (Social Determinants of Health) assessments performed: Yes See Care Plan activities for detailed interventions related to SDOH    Objective: Outpatient Encounter Medications as of 08/18/2020  Medication Sig  . acyclovir (ZOVIRAX) 200 MG capsule TAKE 1 CAPSULE EVERY DAY AS NEEDED FOR  FLAREUPS  . clotrimazole (MYCELEX) 10 MG troche Allow one to dissolve in the mouth 5 times daily For yeast  . clotrimazole-betamethasone (LOTRISONE) cream Apply 1 application topically 2 (two) times daily.  . diclofenac (VOLTAREN) 75 MG EC tablet Take 1 tablet (75 mg total) by mouth 2 (two) times daily. For muscle and  Joint pain  . fluticasone (FLONASE) 50 MCG/ACT nasal spray Place 2 sprays into both nostrils as needed.  . furosemide (LASIX) 20 MG tablet Take 1 tablet (20 mg total) by mouth every morning.  . meclizine (ANTIVERT) 25 MG tablet TAKE 1 TABLET THREE TIMES DAILY AS NEEDED  FOR  DIZZINESS  . metFORMIN (GLUCOPHAGE-XR) 750 MG 24 hr tablet Take 1 tablet (750 mg total) by mouth daily with breakfast.  . olmesartan (BENICAR) 20 MG tablet Take 1 tablet  (20 mg total) by mouth daily.  . pravastatin (PRAVACHOL) 40 MG tablet Take 1 tablet (40 mg total) by mouth every morning.  . VENTOLIN HFA 108 (90 Base) MCG/ACT inhaler INHALE 2 PUFFS BY MOUTH EVERY 6 HOURS AS NEEDED FOR WHEEZING OR SHORTNESS OF BREATH   No facility-administered encounter medications on file as of 08/18/2020.    BP Readings from Last 3 Encounters:  07/22/20 126/82  01/23/20 137/86  10/24/19 (!) 148/96   Lab Results  Component Value Date   HGBA1C 5.8 07/22/2020   HGBA1C 6.0 01/23/2020   HGBA1C 5.8 10/24/2019   Lab Results  Component Value Date   LDLCALC 106 (H) 07/22/2020   CREATININE 0.81 07/22/2020   Lab Results  Component Value Date   CHOL 169 07/22/2020   HDL 44 07/22/2020   LDLCALC 106 (H) 07/22/2020   TRIG 103 07/22/2020   CHOLHDL 3.8 07/22/2020    RN Care Plan           This Visit's Progress   . Chronic Disease Management Needs       CARE PLAN ENTRY (see longtitudinal plan of care for additional care plan information)  Current Barriers:  . Chronic Disease Management support, education, and care coordination needs related to HTN, Sleep apnea, OA, HLD  Clinical Goal(s) related to HTN, Sleep apnea, OA, HLD:  Over the next 90 days, patient will:  . Work with the care management team to address educational, disease management, and care coordination needs  . Begin or continue self health monitoring activities as directed today Measure and record cbg (blood glucose)  2 times weekly and Measure and record blood pressure 3 times per week . Call provider office for new or worsened signs and symptoms Blood glucose findings outside established parameters and Blood pressure findings outside established parameters . Call care management team with questions or concerns . Verbalize basic understanding of patient centered plan of care established today  Interventions related to HTN, Sleep apnea, OA, HLD:  . Evaluation of current treatment plans and patient's  adherence to plan as established by provider . Assessed patient understanding of disease states . Assessed patient's education and care coordination needs . Provided disease specific education to patient  . Collaborated with appropriate clinical care team members regarding patient needs  Patient Self Care Activities related to HTN, Sleep apnea, OA, HLD:  . Patient is able to perform ADLs and IADLs independently . Patient would like assistance with managing chronic medical conditions  Initial goal documentation          Plan:   Telephone follow up appointment with care management team member scheduled for: 11/19/20 with RN Care Manager  Chong Sicilian, BSN, RN-BC [facility] Direct Dial: [phone]

## 2020-08-18 NOTE — Patient Instructions (Signed)
Visit Information  Goals Addressed            This Visit's Progress   . Chronic Disease Management Needs       CARE PLAN ENTRY (see longtitudinal plan of care for additional care plan information)  Current Barriers:  . Chronic Disease Management support, education, and care coordination needs related to HTN, Sleep apnea, OA, HLD  Clinical Goal(s) related to HTN, Sleep apnea, OA, HLD:  Over the next 90 days, patient will:  . Work with the care management team to address educational, disease management, and care coordination needs  . Begin or continue self health monitoring activities as directed today Measure and record cbg (blood glucose) 2 times weekly and Measure and record blood pressure 3 times per week . Call provider office for new or worsened signs and symptoms Blood glucose findings outside established parameters and Blood pressure findings outside established parameters . Call care management team with questions or concerns . Verbalize basic understanding of patient centered plan of care established today  Interventions related to HTN, Sleep apnea, OA, HLD:  . Evaluation of current treatment plans and patient's adherence to plan as established by provider . Assessed patient understanding of disease states . Assessed patient's education and care coordination needs . Provided disease specific education to patient  . Collaborated with appropriate clinical care team members regarding patient needs  Patient Self Care Activities related to HTN, Sleep apnea, OA, HLD:  . Patient is able to perform ADLs and IADLs independently . Patient would like assistance with managing chronic medical conditions  Initial goal documentation         Ms. Raphael was given information about Chronic Care Management services today including:  1. CCM service includes personalized support from designated clinical staff supervised by her physician, including individualized plan of care and  coordination with other care providers 2. 24/7 contact phone numbers for assistance for urgent and routine care needs. 3. Service will only be billed when office clinical staff spend 20 minutes or more in a month to coordinate care. 4. Only one practitioner may furnish and bill the service in a calendar month. 5. The patient may stop CCM services at any time (effective at the end of the month) by phone call to the office staff. 6. The patient will be responsible for cost sharing (co-pay) of up to 20% of the service fee (after annual deductible is met).  Patient agreed to services and verbal consent obtained.   Patient verbalizes understanding of instructions provided today.   The care management team will reach out to the patient again over the next 90 days.   Chong Sicilian, BSN, RN-BC Embedded Chronic Care Manager [facility] [place] [facility] Direct Dial: [phone]

## 2020-08-26 DIAGNOSIS — H4902 Third [oculomotor] nerve palsy, left eye: Secondary | ICD-10-CM | POA: Diagnosis not present

## 2020-08-28 ENCOUNTER — Other Ambulatory Visit: Payer: Self-pay | Admitting: Ophthalmology

## 2020-09-08 ENCOUNTER — Other Ambulatory Visit: Payer: Self-pay | Admitting: Ophthalmology

## 2020-09-08 DIAGNOSIS — H4902 Third [oculomotor] nerve palsy, left eye: Secondary | ICD-10-CM

## 2020-09-23 DIAGNOSIS — H4902 Third [oculomotor] nerve palsy, left eye: Secondary | ICD-10-CM | POA: Diagnosis not present

## 2020-10-10 ENCOUNTER — Ambulatory Visit
Admission: RE | Admit: 2020-10-10 | Discharge: 2020-10-10 | Disposition: A | Discharge status: Home / Self Care | Payer: Medicare Other | Source: Ambulatory Visit | Attending: Ophthalmology | Admitting: Ophthalmology

## 2020-10-10 DIAGNOSIS — G839 Paralytic syndrome, unspecified: Secondary | ICD-10-CM | POA: Diagnosis not present

## 2020-10-10 DIAGNOSIS — R9082 White matter disease, unspecified: Secondary | ICD-10-CM | POA: Diagnosis not present

## 2020-10-10 DIAGNOSIS — H4902 Third [oculomotor] nerve palsy, left eye: Secondary | ICD-10-CM

## 2020-10-10 DIAGNOSIS — G589 Mononeuropathy, unspecified: Secondary | ICD-10-CM | POA: Diagnosis not present

## 2020-10-10 MED ORDER — GADOBENATE DIMEGLUMINE 529 MG/ML IV SOLN
18.0000 mL | Freq: Once | INTRAVENOUS | Status: AC | PRN
  Administered 2020-10-10: 18 mL via INTRAVENOUS

## 2020-10-14 DIAGNOSIS — S0232XG Fracture of orbital floor, left side, subsequent encounter for fracture with delayed healing: Secondary | ICD-10-CM | POA: Diagnosis not present

## 2020-10-14 DIAGNOSIS — H532 Diplopia: Secondary | ICD-10-CM | POA: Diagnosis not present

## 2020-10-15 DIAGNOSIS — M9904 Segmental and somatic dysfunction of sacral region: Secondary | ICD-10-CM | POA: Diagnosis not present

## 2020-10-15 DIAGNOSIS — M9901 Segmental and somatic dysfunction of cervical region: Secondary | ICD-10-CM | POA: Diagnosis not present

## 2020-10-15 DIAGNOSIS — M62830 Muscle spasm of back: Secondary | ICD-10-CM | POA: Diagnosis not present

## 2020-10-15 DIAGNOSIS — M9903 Segmental and somatic dysfunction of lumbar region: Secondary | ICD-10-CM | POA: Diagnosis not present

## 2020-10-20 ENCOUNTER — Ambulatory Visit (INDEPENDENT_AMBULATORY_CARE_PROVIDER_SITE_OTHER): Payer: Medicare Other

## 2020-10-20 ENCOUNTER — Other Ambulatory Visit: Payer: Self-pay

## 2020-10-20 DIAGNOSIS — Z23 Encounter for immunization: Secondary | ICD-10-CM

## 2020-11-11 ENCOUNTER — Telehealth: Payer: Medicare Other | Admitting: *Deleted

## 2020-11-14 DIAGNOSIS — Z23 Encounter for immunization: Secondary | ICD-10-CM | POA: Diagnosis not present

## 2020-11-18 DIAGNOSIS — M9903 Segmental and somatic dysfunction of lumbar region: Secondary | ICD-10-CM | POA: Diagnosis not present

## 2020-11-18 DIAGNOSIS — M62830 Muscle spasm of back: Secondary | ICD-10-CM | POA: Diagnosis not present

## 2020-11-18 DIAGNOSIS — M9904 Segmental and somatic dysfunction of sacral region: Secondary | ICD-10-CM | POA: Diagnosis not present

## 2020-11-18 DIAGNOSIS — M9901 Segmental and somatic dysfunction of cervical region: Secondary | ICD-10-CM | POA: Diagnosis not present

## 2020-11-19 ENCOUNTER — Telehealth: Payer: Medicare Other

## 2020-11-26 DIAGNOSIS — M9901 Segmental and somatic dysfunction of cervical region: Secondary | ICD-10-CM | POA: Diagnosis not present

## 2020-11-26 DIAGNOSIS — M9903 Segmental and somatic dysfunction of lumbar region: Secondary | ICD-10-CM | POA: Diagnosis not present

## 2020-11-26 DIAGNOSIS — M62830 Muscle spasm of back: Secondary | ICD-10-CM | POA: Diagnosis not present

## 2020-11-26 DIAGNOSIS — M9904 Segmental and somatic dysfunction of sacral region: Secondary | ICD-10-CM | POA: Diagnosis not present

## 2020-11-27 DIAGNOSIS — M9901 Segmental and somatic dysfunction of cervical region: Secondary | ICD-10-CM | POA: Diagnosis not present

## 2020-11-27 DIAGNOSIS — M9904 Segmental and somatic dysfunction of sacral region: Secondary | ICD-10-CM | POA: Diagnosis not present

## 2020-11-27 DIAGNOSIS — M62830 Muscle spasm of back: Secondary | ICD-10-CM | POA: Diagnosis not present

## 2020-11-27 DIAGNOSIS — M9903 Segmental and somatic dysfunction of lumbar region: Secondary | ICD-10-CM | POA: Diagnosis not present

## 2020-12-16 DIAGNOSIS — G589 Mononeuropathy, unspecified: Secondary | ICD-10-CM | POA: Insufficient documentation

## 2020-12-17 ENCOUNTER — Other Ambulatory Visit: Payer: Self-pay | Admitting: Ophthalmology

## 2020-12-17 DIAGNOSIS — H532 Diplopia: Secondary | ICD-10-CM | POA: Diagnosis not present

## 2020-12-17 DIAGNOSIS — H2513 Age-related nuclear cataract, bilateral: Secondary | ICD-10-CM | POA: Diagnosis not present

## 2020-12-17 DIAGNOSIS — S0232XG Fracture of orbital floor, left side, subsequent encounter for fracture with delayed healing: Secondary | ICD-10-CM | POA: Diagnosis not present

## 2020-12-23 ENCOUNTER — Telehealth: Payer: Self-pay

## 2020-12-23 MED ORDER — DICLOFENAC SODIUM 75 MG PO TBEC
75.0000 mg | DELAYED_RELEASE_TABLET | Freq: Two times a day (BID) | ORAL | 0 refills | Status: DC
Start: 2020-12-23 — End: 2021-01-21

## 2020-12-23 MED ORDER — METFORMIN HCL ER 750 MG PO TB24
750.0000 mg | ORAL_TABLET | Freq: Every day | ORAL | 0 refills | Status: DC
Start: 2020-12-23 — End: 2021-01-21

## 2020-12-23 MED ORDER — OLMESARTAN MEDOXOMIL 20 MG PO TABS: 20.0000 mg | ORAL_TABLET | Freq: Every day | ORAL | 0 refills | Status: DC

## 2020-12-23 MED ORDER — MECLIZINE HCL 25 MG PO TABS: ORAL_TABLET | ORAL | 0 refills | Status: DC

## 2020-12-23 MED ORDER — FUROSEMIDE 20 MG PO TABS
20.0000 mg | ORAL_TABLET | ORAL | 0 refills | Status: DC
Start: 2020-12-23 — End: 2021-01-21

## 2020-12-23 MED ORDER — PRAVASTATIN SODIUM 40 MG PO TABS
40.0000 mg | ORAL_TABLET | Freq: Every morning | ORAL | 0 refills | Status: DC
Start: 2020-12-23 — End: 2021-01-21

## 2020-12-23 MED ORDER — ACYCLOVIR 200 MG PO CAPS
ORAL_CAPSULE | ORAL | 0 refills | Status: DC
Start: 2020-12-23 — End: 2021-01-21

## 2020-12-23 NOTE — Telephone Encounter (Signed)
  Prescription Request  12/23/2020  What is the name of the medication or equipment? Olmesartan, acyclovir, metfornim, diclofenac, meclizine pravastatin and furosemide . Pt wants 90 day supply  Have you contacted your pharmacy to request a refill? (if applicable) no  Which [facility] would you [facility] sent to[facility]   Patient notified that their request is being sent to the clinical staff for review and that they should receive a response within 2 business days.

## 2021-01-06 DIAGNOSIS — Z96652 Presence of left artificial knee joint: Secondary | ICD-10-CM | POA: Insufficient documentation

## 2021-01-07 ENCOUNTER — Ambulatory Visit
Admission: RE | Admit: 2021-01-07 | Discharge: 2021-01-07 | Disposition: A | Discharge status: Home / Self Care | Payer: Medicare Other | Source: Ambulatory Visit | Attending: Ophthalmology | Admitting: Ophthalmology

## 2021-01-07 ENCOUNTER — Other Ambulatory Visit: Payer: Self-pay

## 2021-01-07 DIAGNOSIS — H532 Diplopia: Secondary | ICD-10-CM | POA: Diagnosis not present

## 2021-01-07 DIAGNOSIS — S0232XG Fracture of orbital floor, left side, subsequent encounter for fracture with delayed healing: Secondary | ICD-10-CM

## 2021-01-09 DIAGNOSIS — Z96652 Presence of left artificial knee joint: Secondary | ICD-10-CM | POA: Diagnosis not present

## 2021-01-22 ENCOUNTER — Other Ambulatory Visit: Payer: Self-pay

## 2021-01-22 ENCOUNTER — Ambulatory Visit (INDEPENDENT_AMBULATORY_CARE_PROVIDER_SITE_OTHER): Payer: Medicare Other | Admitting: Family Medicine

## 2021-01-22 ENCOUNTER — Encounter: Payer: Self-pay | Admitting: Family Medicine

## 2021-01-22 VITALS — Temp 96.5°F | Ht 65.0 in | Wt 200.4 lb

## 2021-01-22 DIAGNOSIS — J301 Allergic rhinitis due to pollen: Secondary | ICD-10-CM | POA: Diagnosis not present

## 2021-01-22 DIAGNOSIS — I10 Essential (primary) hypertension: Secondary | ICD-10-CM | POA: Diagnosis not present

## 2021-01-22 DIAGNOSIS — R7303 Prediabetes: Secondary | ICD-10-CM | POA: Diagnosis not present

## 2021-01-22 DIAGNOSIS — M1710 Unilateral primary osteoarthritis, unspecified knee: Secondary | ICD-10-CM | POA: Diagnosis not present

## 2021-01-22 DIAGNOSIS — E782 Mixed hyperlipidemia: Secondary | ICD-10-CM | POA: Diagnosis not present

## 2021-01-22 DIAGNOSIS — E559 Vitamin D deficiency, unspecified: Secondary | ICD-10-CM

## 2021-01-22 DIAGNOSIS — I209 Angina pectoris, unspecified: Secondary | ICD-10-CM

## 2021-01-22 DIAGNOSIS — G44031 Episodic paroxysmal hemicrania, intractable: Secondary | ICD-10-CM

## 2021-01-22 DIAGNOSIS — M179 Osteoarthritis of knee, unspecified: Secondary | ICD-10-CM

## 2021-01-22 DIAGNOSIS — H81311 Aural vertigo, right ear: Secondary | ICD-10-CM | POA: Diagnosis not present

## 2021-01-22 LAB — BAYER DCA HB A1C WAIVED: HB A1C (BAYER DCA - WAIVED): 5.6 % (ref <7.0)

## 2021-01-22 MED ORDER — DICLOFENAC SODIUM 75 MG PO TBEC: 75.0000 mg | DELAYED_RELEASE_TABLET | Freq: Two times a day (BID) | ORAL | 1 refills | Status: DC

## 2021-01-22 MED ORDER — OLMESARTAN MEDOXOMIL 20 MG PO TABS: 20.0000 mg | ORAL_TABLET | Freq: Every day | ORAL | 1 refills | Status: DC

## 2021-01-22 MED ORDER — ACYCLOVIR 200 MG PO CAPS
ORAL_CAPSULE | ORAL | 1 refills | Status: DC
Start: 2021-01-22 — End: 2021-08-20

## 2021-01-22 MED ORDER — METFORMIN HCL ER 750 MG PO TB24: 750.0000 mg | ORAL_TABLET | Freq: Every day | ORAL | 1 refills | Status: DC

## 2021-01-22 MED ORDER — FUROSEMIDE 20 MG PO TABS: 20.0000 mg | ORAL_TABLET | ORAL | 1 refills | Status: DC

## 2021-01-22 MED ORDER — FLUTICASONE PROPIONATE 50 MCG/ACT NA SUSP
2.0000 | NASAL | 1 refills | Status: AC | PRN
Start: 2021-01-22 — End: ?

## 2021-01-22 MED ORDER — VENTOLIN HFA 108 (90 BASE) MCG/ACT IN AERS: INHALATION_SPRAY | RESPIRATORY_TRACT | 3 refills | Status: AC

## 2021-01-22 MED ORDER — MECLIZINE HCL 25 MG PO TABS
ORAL_TABLET | ORAL | 1 refills | Status: DC
Start: 2021-01-22 — End: 2021-04-29

## 2021-01-22 MED ORDER — PRAVASTATIN SODIUM 40 MG PO TABS: 40.0000 mg | ORAL_TABLET | Freq: Every morning | ORAL | 1 refills | Status: DC

## 2021-01-22 NOTE — Progress Notes (Signed)
 Subjective:  Patient ID: Bethany [name],  female    DOB: 02/28/1951  Age: 70 y.o.    CC: Medical Management of Chronic Issues   HPI Sharyl Rolfson presents for  follow-up of hypertension. Patient has no history of headache chest pain or shortness of breath or recent cough. Patient also denies symptoms of TIA such as numbness weakness lateralizing. Patient denies side effects from medication. States taking it regularly.  Patient also  in for follow-up of elevated cholesterol. Doing well without complaints on current medication. Denies side effects  including myalgia and arthralgia and nausea. Also in today for liver function testing. Currently no chest pain, shortness of breath or other cardiovascular related symptoms noted.  Follow-up of prediabetes. Patient does not check blood sugar at home. Patient denies symptoms such as excessive hunger or urinary frequency, excessive hunger, nausea No significant hypoglycemic spells noted. Medications reviewed. Pt reports taking them regularly. Pt. denies complication/adverse reaction today.   Right sided headaches, vertigo recently increasing.  First headache was on New Year's Eve.  Second 1 was just a few days ago.  She also is having vertigo where if she rolls over onto her right side she will feel room moving disoriented nausea and off balance.  Additionally the right-sided headaches are situated right over the right ear in the temporal parietal region.  Two episodes of chest pain. Lasted thirty minutes.  Precordial radiating towards the left shoulder.  Not clearly exertional.  No nausea diaphoresis or shortness of breath.   History Karrina has a past medical history of Allergy (1997), Arthritis, GERD (gastroesophageal reflux disease), Hyperlipidemia, Hypertension, Obesity, Pneumonia, Seasonal allergies, and Sleep apnea.   She has a past surgical history that includes Abdominal hysterectomy; Tubal ligation; phlebectomy; Total knee arthroplasty  (Right, 10/22/2013); Ganglion cyst excision; Breast surgery; and Total knee arthroplasty (Left, 06/11/2015).   Her family history includes Arthritis in her brother; Diabetes in her sister; Early death in her father; Heart disease in her mother and sister.She reports that she has never smoked. She has never used smokeless tobacco. She reports that she does not drink alcohol and does not use drugs.  No current outpatient medications on file prior to visit.   No current facility-administered medications on file prior to visit.    ROS Review of Systems  Constitutional: Negative.   HENT: Negative.   Eyes: Negative for visual disturbance.  Respiratory: Negative for shortness of breath.   Cardiovascular: Negative for chest pain.  Gastrointestinal: Negative for abdominal pain.  Musculoskeletal: Negative for arthralgias.    Objective:  Temp (!) 96.5 F (35.8 C) (Temporal)   Ht 5' 5" (1.651 m)   Wt 200 lb 6.4 oz (90.9 kg)   BMI 33.35 kg/m   BP Readings from Last 3 Encounters:  07/22/20 126/82  01/23/20 137/86  10/24/19 (!) 148/96    Wt Readings from Last 3 Encounters:  01/22/21 200 lb 6.4 oz (90.9 kg)  07/22/20 (!) 203 lb 3.2 oz (92.2 kg)  01/23/20 215 lb 3.2 oz (97.6 kg)    Physical Exam Constitutional:      General: She is not in acute distress.    Appearance: She is well-developed.  HENT:     Head: Normocephalic and atraumatic.  Eyes:     Conjunctiva/sclera: Conjunctivae normal.     Pupils: Pupils are equal, round, and reactive to light.  Neck:     Thyroid: No thyromegaly.  Cardiovascular:     Rate and Rhythm: Normal rate and regular rhythm.       Heart sounds: Normal heart sounds. No murmur heard.   Pulmonary:     Effort: Pulmonary effort is normal. No respiratory distress.     Breath sounds: Normal breath sounds. No wheezing or rales.  Abdominal:     General: Bowel sounds are normal. There is no distension.     Palpations: Abdomen is soft.     Tenderness: There  is no abdominal tenderness.  Musculoskeletal:        General: Normal range of motion.     Cervical back: Normal range of motion and neck supple.  Lymphadenopathy:     Cervical: No cervical adenopathy.  Skin:    General: Skin is warm and dry.  Neurological:     Mental Status: She is alert and oriented to person, place, and time.  Psychiatric:        Behavior: Behavior normal.        Thought Content: Thought content normal.        Judgment: Judgment normal.      Assessment & Plan:   Naryah was seen today for medical management of chronic issues.  Diagnoses and all orders for this visit:  Angina pectoris ([place]) -     Ambulatory referral to Cardiology  Class 2 severe obesity due to excess calories with serious comorbidity in adult, unspecified BMI ([place]) -     CBC with Differential/Platelet -     CMP14+EGFR -     Thyroid Panel With TSH -     VENTOLIN HFA 108 (90 Base) MCG/ACT inhaler; INHALE 2 PUFFS BY MOUTH EVERY 6 HOURS AS NEEDED FOR WHEEZING OR SHORTNESS OF BREATH  Allergic rhinitis due to pollen, unspecified seasonality -     CBC with Differential/Platelet -     CMP14+EGFR -     Thyroid Panel With TSH -     fluticasone (FLONASE) 50 MCG/ACT nasal spray; Place 2 sprays into both nostrils as needed.  Mixed hyperlipidemia -     CBC with Differential/Platelet -     CMP14+EGFR -     Lipid panel -     Thyroid Panel With TSH -     pravastatin (PRAVACHOL) 40 MG tablet; Take 1 tablet (40 mg total) by mouth every morning.  Primary hypertension -     CBC with Differential/Platelet -     CMP14+EGFR -     Thyroid Panel With TSH -     olmesartan (BENICAR) 20 MG tablet; Take 1 tablet (20 mg total) by mouth daily.  Osteoarthritis of knee, unspecified laterality, unspecified osteoarthritis type -     CBC with Differential/Platelet -     CMP14+EGFR -     Thyroid Panel With TSH -     diclofenac (VOLTAREN) 75 MG EC tablet; Take 1 tablet (75 mg total) by mouth 2 (two) times daily.  For muscle and  Joint pain  Prediabetes -     CBC with Differential/Platelet -     CMP14+EGFR -     Microalbumin / creatinine urine ratio -     Thyroid Panel With TSH -     Bayer DCA Hb A1c Waived -     metFORMIN (GLUCOPHAGE-XR) 750 MG 24 hr tablet; Take 1 tablet (750 mg total) by mouth daily with breakfast.  Vitamin D deficiency -     CBC with Differential/Platelet -     CMP14+EGFR -     Thyroid Panel With TSH -     VITAMIN D 25 Hydroxy (Vit-D Deficiency, Fractures)  Intractable episodic paroxysmal  hemicrania -     MR BRAIN/IAC W WO CONTRAST; Future  Auditory vertigo involving right ear -     MR BRAIN/IAC W WO CONTRAST; Future  Other orders -     meclizine (ANTIVERT) 25 MG tablet; TAKE 1 TABLET THREE TIMES DAILY AS NEEDED  FOR  DIZZINESS -     furosemide (LASIX) 20 MG tablet; Take 1 tablet (20 mg total) by mouth every morning. -     acyclovir (ZOVIRAX) 200 MG capsule; TAKE 1 CAPSULE EVERY DAY AS NEEDED FOR  FLAREUPS   I have discontinued Averianna Zundel's clotrimazole-betamethasone and clotrimazole. I am also having her maintain her Ventolin HFA, pravastatin, olmesartan, metFORMIN, meclizine, furosemide, fluticasone, diclofenac, and acyclovir.  Meds ordered this encounter  Medications  . VENTOLIN HFA 108 (90 Base) MCG/ACT inhaler    Sig: INHALE 2 PUFFS BY MOUTH EVERY 6 HOURS AS NEEDED FOR WHEEZING OR SHORTNESS OF BREATH    Dispense:  18 g    Refill:  3  . pravastatin (PRAVACHOL) 40 MG tablet    Sig: Take 1 tablet (40 mg total) by mouth every morning.    Dispense:  90 tablet    Refill:  1  . olmesartan (BENICAR) 20 MG tablet    Sig: Take 1 tablet (20 mg total) by mouth daily.    Dispense:  90 tablet    Refill:  1  . metFORMIN (GLUCOPHAGE-XR) 750 MG 24 hr tablet    Sig: Take 1 tablet (750 mg total) by mouth daily with breakfast.    Dispense:  90 tablet    Refill:  1  . meclizine (ANTIVERT) 25 MG tablet    Sig: TAKE 1 TABLET THREE TIMES DAILY AS NEEDED  FOR  DIZZINESS     Dispense:  270 tablet    Refill:  1  . furosemide (LASIX) 20 MG tablet    Sig: Take 1 tablet (20 mg total) by mouth every morning.    Dispense:  90 tablet    Refill:  1  . fluticasone (FLONASE) 50 MCG/ACT nasal spray    Sig: Place 2 sprays into both nostrils as needed.    Dispense:  48 g    Refill:  1  . diclofenac (VOLTAREN) 75 MG EC tablet    Sig: Take 1 tablet (75 mg total) by mouth 2 (two) times daily. For muscle and  Joint pain    Dispense:  180 tablet    Refill:  1  . acyclovir (ZOVIRAX) 200 MG capsule    Sig: TAKE 1 CAPSULE EVERY DAY AS NEEDED FOR  FLAREUPS    Dispense:  90 capsule    Refill:  1     Follow-up: Return in about 2 weeks (around 02/05/2021).  Claretta Fraise, M.D.

## 2021-01-23 LAB — CMP14+EGFR
ALT: 10 IU/L (ref 0–32)
AST: 17 IU/L (ref 0–40)
Albumin/Globulin Ratio: 1.3 (ref 1.2–2.2)
Albumin: 4.3 g/dL (ref 3.8–4.8)
Alkaline Phosphatase: 79 IU/L (ref 44–121)
BUN/Creatinine Ratio: 12 (ref 12–28)
BUN: 11 mg/dL (ref 8–27)
Bilirubin Total: 0.8 mg/dL (ref 0.0–1.2)
CO2: 24 mmol/L (ref 20–29)
Calcium: 10.2 mg/dL (ref 8.7–10.3)
Chloride: 107 mmol/L — ABNORMAL HIGH (ref 96–106)
Creatinine, Ser: 0.95 mg/dL (ref 0.57–1.00)
GFR calc Af Amer: 70 mL/min/{1.73_m2} (ref >59)
GFR calc non Af Amer: 61 mL/min/{1.73_m2} (ref >59)
Globulin, Total: 3.2 g/dL (ref 1.5–4.5)
Glucose: 94 mg/dL (ref 65–99)
Potassium: 4.1 mmol/L (ref 3.5–5.2)
Sodium: 145 mmol/L — ABNORMAL HIGH (ref 134–144)
Total Protein: 7.5 g/dL (ref 6.0–8.5)

## 2021-01-23 LAB — CBC WITH DIFFERENTIAL/PLATELET
Basophils Absolute: 0.1 10*3/uL (ref 0.0–0.2)
Basos: 1 %
EOS (ABSOLUTE): 0.2 10*3/uL (ref 0.0–0.4)
Eos: 2 %
Hematocrit: 39.6 % (ref 34.0–46.6)
Hemoglobin: 13.2 g/dL (ref 11.1–15.9)
Immature Grans (Abs): 0 10*3/uL (ref 0.0–0.1)
Immature Granulocytes: 0 %
Lymphocytes Absolute: 2.8 10*3/uL (ref 0.7–3.1)
Lymphs: 32 %
MCH: 29.1 pg (ref 26.6–33.0)
MCHC: 33.3 g/dL (ref 31.5–35.7)
MCV: 87 fL (ref 79–97)
Monocytes Absolute: 0.6 10*3/uL (ref 0.1–0.9)
Monocytes: 7 %
Neutrophils Absolute: 5 10*3/uL (ref 1.4–7.0)
Neutrophils: 58 %
Platelets: 273 10*3/uL (ref 150–450)
RBC: 4.53 x10E6/uL (ref 3.77–5.28)
RDW: 14.1 % (ref 11.7–15.4)
WBC: 8.6 10*3/uL (ref 3.4–10.8)

## 2021-01-23 LAB — LIPID PANEL
Chol/HDL Ratio: 3.4 ratio (ref 0.0–4.4)
Cholesterol, Total: 180 mg/dL (ref 100–199)
HDL: 53 mg/dL (ref >39)
LDL Chol Calc (NIH): 112 mg/dL — ABNORMAL HIGH (ref 0–99)
Triglycerides: 78 mg/dL (ref 0–149)
VLDL Cholesterol Cal: 15 mg/dL (ref 5–40)

## 2021-01-23 LAB — THYROID PANEL WITH TSH
Free Thyroxine Index: 2.1 (ref 1.2–4.9)
T3 Uptake Ratio: 24 % (ref 24–39)
T4, Total: 8.9 ug/dL (ref 4.5–12.0)
TSH: 2.83 u[IU]/mL (ref 0.450–4.500)

## 2021-01-23 LAB — MICROALBUMIN / CREATININE URINE RATIO
Creatinine, Urine: 163.4 mg/dL
Microalb/Creat Ratio: 46 mg/g creat — ABNORMAL HIGH (ref 0–29)
Microalbumin, Urine: 75.5 ug/mL

## 2021-01-23 LAB — VITAMIN D 25 HYDROXY (VIT D DEFICIENCY, FRACTURES): Vit D, 25-Hydroxy: 37.8 ng/mL (ref 30.0–100.0)

## 2021-01-23 NOTE — Progress Notes (Signed)
Hello Kamryn,  Your lab result is normal and/or stable.Some minor variations that are not significant are commonly marked abnormal, but do not represent any medical problem for you.  Best regards, Melena Hayes, M.D.

## 2021-01-25 ENCOUNTER — Encounter: Payer: Self-pay | Admitting: Family Medicine

## 2021-01-26 ENCOUNTER — Telehealth: Payer: Self-pay | Admitting: Family Medicine

## 2021-01-26 DIAGNOSIS — M9904 Segmental and somatic dysfunction of sacral region: Secondary | ICD-10-CM | POA: Diagnosis not present

## 2021-01-26 DIAGNOSIS — M9901 Segmental and somatic dysfunction of cervical region: Secondary | ICD-10-CM | POA: Diagnosis not present

## 2021-01-26 DIAGNOSIS — M62830 Muscle spasm of back: Secondary | ICD-10-CM | POA: Diagnosis not present

## 2021-01-26 DIAGNOSIS — M9903 Segmental and somatic dysfunction of lumbar region: Secondary | ICD-10-CM | POA: Diagnosis not present

## 2021-01-26 NOTE — Progress Notes (Signed)
Cardiology Office Note:   Date:  01/27/2021  NAME:  Bethany [name]    MRN: [id number] DOB:  20-Jan-1951   PCP:  Claretta Fraise, MD  Cardiologist:  No primary care provider on file.   Referring MD: Claretta Fraise, MD   Chief Complaint  Patient presents with  . New Patient (Initial Visit)  . Chest Pain  . Edema    Left foot.   History of Present Illness:   Bethany [name] is a 70 y.o. female with a hx of HTN, HLD, obesity who is being seen today for the evaluation of chest pain at the request of Claretta Fraise, MD.  She presents for the evaluation of chest pain and syncope.  She reports has had 2 chest pain episodes.  The first occurred around Thanksgiving.  She reports after eating a big meal she had pressure in her chest.  Symptoms improved with belching.  They resolve within 30 minutes.  They were not exertional and not associated with any activity.  They were not alleviated by cessation of activity.  Symptoms improved.  She had another episode in December 2021.  This occurred while eating fish.  Apparently she was eating and felt a similar pressure in her chest.  She reports he felt lightheaded and dizzy and her symptoms improved within 30 minutes.  She was evaluated by EMS and symptoms have not improved.  She has no exertional chest pain or pressure.  No exertional shortness of breath.  She also reports she had 2 syncopal events.  She had a syncopal event in May 2021.  Apparently this occurred after driving to [facility].  She reports after a 5-hour drive she got out of the car.  She was apparently waiting in the hot sun and she reports she passed out.  She describes no chest pain, shortness of breath or palpitations prior to the event.  There was no seizure-like activity reported.  There is no dizziness or lightheadedness.  She reports she came to within minutes.  She reports she had not had much to eat or drink that day.  She had a second episode in July of passing out.  She  reports she was walking and had no prodrome of chest pain or shortness of breath.  No palpitations either.  She reports she again passed out and came to within minutes.  No seizure-like activity.  No urination or defecation.  Her blood pressure today in office is 130/96.  Her EKG in office shows normal sinus rhythm with no acute ischemic changes or evidence of prior infarction.  Medical history significant for diabetes with an A1c of 5.6.  She still takes Metformin.  She also has hyperlipidemia for which she takes pravastatin.  She also has a history of high blood pressure that is well controlled on olmesartan.  She denies any exertional chest pain or pressure.  No further chest pain episodes or syncope reported.  She reports she does not drink enough water.  She reports she may drink 2 to 3 glasses/day.  I have encouraged 60 ounces plus.  She has no evidence of heart murmur on examination.  She is a never smoker, does not drink alcohol or use drugs.  She is retired.  She is married and had 4 children.  She has several grandchildren and great-grandchildren.  Recent lab work from her PCP shows hemoglobin 12.5 and TSH 2.8.  Problem List 1. HTN 2. HLD -T chol 180, HDL 53, LDL 112, TG 78 -A1c  5.6 3. Obesity -BMI 33  Past Medical History: Past Medical History:  Diagnosis Date  . Allergy 1997   knees  . Arthritis   . Diabetes mellitus without complication ([place])   . GERD (gastroesophageal reflux disease)   . Hyperlipidemia   . Hypertension   . Obesity   . Pneumonia    1 month ago - rsolved  . Seasonal allergies   . Sleep apnea    cpap    Past Surgical History: Past Surgical History:  Procedure Laterality Date  . ABDOMINAL HYSTERECTOMY    . BREAST SURGERY     fibroid cysts removed  . GANGLION CYST EXCISION     left  . phlebectomy    . TOTAL KNEE ARTHROPLASTY Right 10/22/2013   Procedure: RIGHT TOTAL KNEE ARTHROPLASTY;  Surgeon: Gearlean Alf, MD;  Location: [facility];  Service:  Orthopedics;  Laterality: Right;  . TOTAL KNEE ARTHROPLASTY Left 06/11/2015   Procedure: LEFT TOTAL KNEE ARTHROPLASTY;  Surgeon: Gaynelle Arabian, MD;  Location: [facility];  Service: Orthopedics;  Laterality: Left;  . TUBAL LIGATION      Current Medications: Current Meds  Medication Sig  . acyclovir (ZOVIRAX) 200 MG capsule TAKE 1 CAPSULE EVERY DAY AS NEEDED FOR  FLAREUPS  . diclofenac (VOLTAREN) 75 MG EC tablet Take 1 tablet (75 mg total) by mouth 2 (two) times daily. For muscle and  Joint pain  . fluticasone (FLONASE) 50 MCG/ACT nasal spray Place 2 sprays into both nostrils as needed.  . furosemide (LASIX) 20 MG tablet Take 1 tablet (20 mg total) by mouth every morning.  . meclizine (ANTIVERT) 25 MG tablet TAKE 1 TABLET THREE TIMES DAILY AS NEEDED  FOR  DIZZINESS  . metoprolol tartrate (LOPRESSOR) 100 MG tablet Take 1 tablet by mouth once for procedure.  . olmesartan (BENICAR) 20 MG tablet Take 1 tablet (20 mg total) by mouth daily.  . pravastatin (PRAVACHOL) 40 MG tablet Take 1 tablet (40 mg total) by mouth every morning.  . VENTOLIN HFA 108 (90 Base) MCG/ACT inhaler INHALE 2 PUFFS BY MOUTH EVERY 6 HOURS AS NEEDED FOR WHEEZING OR SHORTNESS OF BREATH  . [DISCONTINUED] metFORMIN (GLUCOPHAGE-XR) 750 MG 24 hr tablet Take 1 tablet (750 mg total) by mouth daily with breakfast.     Allergies:    Hctz [hydrochlorothiazide]   Social History: Social History   Socioeconomic History  . Marital status: Married    Spouse name: Not on file  . Number of children: 4  . Years of education: 23  . Highest education level: High school graduate  Occupational History  . Occupation: retired    Fish farm manager: [employer]  Tobacco Use  . Smoking status: Never Smoker  . Smokeless tobacco: Never Used  Vaping Use  . Vaping Use: Never used  Substance and Sexual Activity  . Alcohol use: No  . Drug use: No  . Sexual activity: Not Currently    Comment: husband not able to have  sex  Other Topics Concern  . Not on  file  Social History Narrative  . Not on file   Social Determinants of Health   Financial Resource Strain: Low Risk   . Difficulty of Paying Living Expenses: Not hard at all  Food Insecurity: No Food Insecurity  . Worried About Charity fundraiser in the Last Year: Never true  . Ran Out of Food in the Last Year: Never true  Transportation Needs: No Transportation Needs  . Lack of Transportation (Medical): No  .  Lack of Transportation (Non-Medical): No  Physical Activity: Sufficiently Active  . Days of Exercise per Week: 7 days  . Minutes of Exercise per Session: 30 min  Stress: No Stress Concern Present  . Feeling of Stress : Not at all  Social Connections: Socially Integrated  . Frequency of Communication with Friends and Family: More than three times a week  . Frequency of Social Gatherings with Friends and Family: More than three times a week  . Attends Religious Services: More than 4 times per year  . Active Member of Clubs or Organizations: Yes  . Attends Archivist Meetings: More than 4 times per year  . Marital Status: Married     Family History: The patient's family history includes Arthritis in her brother; Diabetes in her sister; Early death in her father; Heart disease in her mother and sister.  ROS:   All other ROS reviewed and negative. Pertinent positives noted in the HPI.     EKGs/Labs/Other Studies Reviewed:   The following studies were personally reviewed by me today:  EKG:  EKG is ordered today.  The ekg ordered today demonstrates normal sinus rhythm heart rate 80, no acute ischemic changes, no evidence of prior infarction, and was personally reviewed by me.   Recent Labs: 01/22/2021: ALT 10; BUN 11; Creatinine, Ser 0.95; Hemoglobin 13.2; Platelets 273; Potassium 4.1; Sodium 145; TSH 2.830   Recent Lipid Panel    Component Value Date/Time   CHOL 180 01/22/2021 0930   CHOL 180 07/02/2013 0852   TRIG 78 01/22/2021 0930   TRIG 105 07/02/2013  0852   HDL 53 01/22/2021 0930   HDL 40 07/02/2013 0852   CHOLHDL 3.4 01/22/2021 0930   LDLCALC 112 (H) 01/22/2021 0930   LDLCALC 119 (H) 07/02/2013 0852    Physical Exam:   VS:  BP (!) 138/96 (BP Location: Right Arm, Patient Position: Sitting, Cuff Size: Large)   Pulse 80   Ht 5' 5"  (1.651 m)   Wt 200 lb (90.7 kg)   BMI 33.28 kg/m    Wt Readings from Last 3 Encounters:  01/27/21 200 lb (90.7 kg)  01/22/21 200 lb 6.4 oz (90.9 kg)  07/22/20 (!) 203 lb 3.2 oz (92.2 kg)    General: Well nourished, well developed, in no acute distress Head: Atraumatic, normal size  Eyes: PEERLA, EOMI  Neck: Supple, no JVD Endocrine: No thryomegaly Cardiac: Normal S1, S2; RRR; no murmurs, rubs, or gallops Lungs: Clear to auscultation bilaterally, no wheezing, rhonchi or rales  Abd: Soft, nontender, no hepatomegaly  Ext: No edema, pulses 2+ Musculoskeletal: No deformities, BUE and BLE strength normal and equal Skin: Warm and dry, no rashes   Neuro: Alert and oriented to person, place, time, and situation, CNII-XII grossly intact, no focal deficits  Psych: Normal mood and affect   ASSESSMENT:   Taryn Shellhammer is a 70 y.o. female who presents for the following: 1. Chest pain, unspecified type   2. Syncope and collapse   3. Mixed hyperlipidemia   4. Primary hypertension   5. Precordial pain    6. Abnormal findings on diagnostic imaging of heart and coronary circulation      PLAN:   1. Chest pain, unspecified type -Atypical chest pain.  More consistent with a gastrointestinal etiology versus acid reflux.  Her EKG in office shows normal sinus rhythm with no acute ischemic changes or evidence of prior infarction.  Her cardiovascular examination is normal.  CVD risk factors include hypertension hyperlipidemia.  Therefore,  I recommended a coronary CTA for further evaluation.  She will take 100 mg of metoprolol tartrate twice before the scan.  Given her syncope I think this is warranted.  Syncope also  does not sound cardiac either but we will make sure.  I also want her to get an echocardiogram.  No murmurs we will make sure her heart structurally normal.  2. Syncope and collapse -2 syncopal episodes.  Story is inconsistent with a cardiac etiology.  EKG is normal.  No murmurs.  Normal cardiovascular examination.  Possibly this is medication related versus dehydration related.  I recommended 60+ ounces of water per day.  She is not drinking enough.  Both instances occurred in the summer months when it was hot.  I suspect dehydration was a major issue here.  Also could be Metformin related.  Her A1c is 5.6 and she is still on this.  I recommended she stop Metformin.  We will proceed with coronary CTA and echocardiogram as above for her chest pain.  3. Mixed hyperlipidemia -Continue pravastatin  4. Primary hypertension -BP well controlled.  No change in medications.  Disposition: Return in about 3 months (around 04/26/2021).  Medication Adjustments/Labs and Tests Ordered: Current medicines are reviewed at length with the patient today.  Concerns regarding medicines are outlined above.  Orders Placed This Encounter  Procedures  . CT CORONARY MORPH W/CTA COR W/SCORE W/CA W/CM &/OR WO/CM  . CT CORONARY FRACTIONAL FLOW RESERVE DATA PREP  . CT CORONARY FRACTIONAL FLOW RESERVE FLUID ANALYSIS  . EKG 12-Lead  . ECHOCARDIOGRAM COMPLETE   Meds ordered this encounter  Medications  . metoprolol tartrate (LOPRESSOR) 100 MG tablet    Sig: Take 1 tablet by mouth once for procedure.    Dispense:  1 tablet    Refill:  0    Patient Instructions  Medication Instructions:  Stop Metformin  Take Metoprolol 100 mg two hours before CT when scheduled.  *If you need a refill on your cardiac medications before your next appointment, please call your pharmacy*  Testing/Procedures: Your physician has requested that you have cardiac CT. Cardiac computed tomography (CT) is a painless test that uses an x-ray  machine to take clear, detailed pictures of your heart. For further information please visit [email or web address]. Please follow instruction sheet as given.   Echocardiogram - Your physician has requested that you have an echocardiogram. Echocardiography is a painless test that uses sound waves to create images of your heart. It provides your doctor with information about the size and shape of your heart and how well your heart's chambers and valves are working. This procedure takes approximately one hour. There are no restrictions for this procedure. This will be performed at our [facility] location - [address].   Follow-Up: At [facility], you and your health needs are our priority.  As part of our continuing mission to provide you with exceptional heart care, we have created designated Provider Care Teams.  These Care Teams include your primary Cardiologist (physician) and Advanced Practice Providers (APPs -  Physician Assistants and Nurse Practitioners) who all work together to provide you with the care you need, when you need it.  We recommend signing up for the patient portal called "MyChart".  Sign up information is provided on this After Visit Summary.  MyChart is used to connect with patients for Virtual Visits (Telemedicine).  Patients are able to view lab/test results, encounter notes, upcoming appointments, etc.  Non-urgent messages can be  sent to your provider as well.   To learn more about what you can do with MyChart, go to [email or web address].    Your next appointment:   3 month(s)  The format for your next appointment:   In Person  Provider:   Eleonore Chiquito, MD   Other Instructions Your cardiac CT will be scheduled at one of the below locations:   [facility] [address] [place], [place] [address] [phone]  [place] [address] [place], [place] [address] [phone]  If scheduled at [facility], please arrive at the [facility] main entrance of [facility] 30 minutes prior to test start time. Proceed to the [facility] Radiology Department (first floor) to check-in and test prep.  If scheduled at [facility], please arrive 15 mins early for check-in and test prep.  Please follow these instructions carefully (unless otherwise directed):  Hold all erectile dysfunction medications at least 3 days (72 hrs) prior to test.  On the Night Before the Test: . Be sure to Drink plenty of water. . Do not consume any caffeinated/decaffeinated beverages or chocolate 12 hours prior to your test. . Do not take any antihistamines 12 hours prior to your test.  On the Day of the Test: . Drink plenty of water. Do not drink any water within one hour of the test. . Do not eat any food 4 hours prior to the test. . You may take your regular medications prior to the test.  . Take metoprolol (Lopressor) two hours prior to test. . HOLD Furosemide/Hydrochlorothiazide morning of the test. . FEMALES- please wear underwire-free bra if available      After the Test: . Drink plenty of water. . After receiving IV contrast, you may experience a mild flushed feeling. This is normal. . On occasion, you may experience a mild rash up to 24 hours after the test. This is not dangerous. If this occurs, you can take Benadryl 25 mg and increase your fluid intake. . If you experience trouble breathing, this can be serious. If it is severe call 911 IMMEDIATELY. If it is mild, please call our office. . If you take any of these medications: Glipizide/Metformin, Avandament, Glucavance, please do not take 48 hours after completing test unless otherwise instructed.   Once we have confirmed authorization from your insurance company, we will call you to set up a date and time for your test. Based on how quickly your insurance processes prior  authorizations requests, please allow up to 4 weeks to be contacted for scheduling your Cardiac CT appointment. Be advised that routine Cardiac CT appointments could be scheduled as many as 8 weeks after your provider has ordered it.  For non-scheduling related questions, please contact the cardiac imaging nurse navigator should you have any questions/concerns: Marchia Bond, Cardiac Imaging Nurse Navigator Burley Saver, Interim Cardiac Imaging Nurse [place] and Vascular Services Direct Office Dial: [phone]   For scheduling needs, including cancellations and rescheduling, please call [place], [phone].        Signed, Addison Naegeli. Audie Box, [place]  [address], [place], [place] [address] [phone]  01/27/2021 10:13 AM

## 2021-01-27 ENCOUNTER — Encounter: Payer: Self-pay | Admitting: Cardiovascular Disease

## 2021-01-27 ENCOUNTER — Ambulatory Visit (INDEPENDENT_AMBULATORY_CARE_PROVIDER_SITE_OTHER): Payer: Medicare Other | Admitting: Cardiovascular Disease

## 2021-01-27 ENCOUNTER — Other Ambulatory Visit: Payer: Self-pay

## 2021-01-27 VITALS — BP 138/96 | HR 80 | Ht 65.0 in | Wt 200.0 lb

## 2021-01-27 DIAGNOSIS — R55 Syncope and collapse: Secondary | ICD-10-CM

## 2021-01-27 DIAGNOSIS — E782 Mixed hyperlipidemia: Secondary | ICD-10-CM

## 2021-01-27 DIAGNOSIS — R931 Abnormal findings on diagnostic imaging of heart and coronary circulation: Secondary | ICD-10-CM

## 2021-01-27 DIAGNOSIS — I10 Essential (primary) hypertension: Secondary | ICD-10-CM | POA: Diagnosis not present

## 2021-01-27 DIAGNOSIS — R072 Precordial pain: Secondary | ICD-10-CM

## 2021-01-27 DIAGNOSIS — R079 Chest pain, unspecified: Secondary | ICD-10-CM

## 2021-01-27 MED ORDER — METOPROLOL TARTRATE 100 MG PO TABS: ORAL_TABLET | ORAL | 0 refills | Status: DC

## 2021-01-27 NOTE — Patient Instructions (Signed)
Medication Instructions:  Stop Metformin  Take Metoprolol 100 mg two hours before CT when scheduled.  *If you need a refill on your cardiac medications before your next appointment, please call your pharmacy*  Testing/Procedures: Your physician has requested that you have cardiac CT. Cardiac computed tomography (CT) is a painless test that uses an x-ray machine to take clear, detailed pictures of your heart. For further information please visit [email or web address]. Please follow instruction sheet as given.   Echocardiogram - Your physician has requested that you have an echocardiogram. Echocardiography is a painless test that uses sound waves to create images of your heart. It provides your doctor with information about the size and shape of your heart and how well your heart's chambers and valves are working. This procedure takes approximately one hour. There are no restrictions for this procedure. This will be performed at our [facility] location - [address], [address].   Follow-Up: At [facility], you and your health needs are our priority.  As part of our continuing mission to provide you with exceptional heart care, we have created designated Provider Care Teams.  These Care Teams include your primary Cardiologist (physician) and Advanced Practice Providers (APPs -  Physician Assistants and Nurse Practitioners) who all work together to provide you with the care you need, when you need it.  We recommend signing up for the patient portal called "MyChart".  Sign up information is provided on this After Visit Summary.  MyChart is used to connect with patients for Virtual Visits (Telemedicine).  Patients are able to view lab/test results, encounter notes, upcoming appointments, etc.  Non-urgent messages can be sent to your provider as well.   To learn more about what you can do with MyChart, go to [email or web address].    Your next appointment:   3 month(s)  The format for your next  appointment:   In Person  Provider:   Eleonore Chiquito, MD   Other Instructions Your cardiac CT will be scheduled at one of the below locations:   [facility] [address] [place], [place] [address] [phone]  [place] [address] [place], [place] [address] [phone]  If scheduled at [facility], please arrive at the [facility] main entrance of [facility] 30 minutes prior to test start time. Proceed to the [facility] Radiology Department (first floor) to check-in and test prep.  If scheduled at [facility], please arrive 15 mins early for check-in and test prep.  Please follow these instructions carefully (unless otherwise directed):  Hold all erectile dysfunction medications at least 3 days (72 hrs) prior to test.  On the Night Before the Test: . Be sure to Drink plenty of water. . Do not consume any caffeinated/decaffeinated beverages or chocolate 12 hours prior to your test. . Do not take any antihistamines 12 hours prior to your test.  On the Day of the Test: . Drink plenty of water. Do not drink any water within one hour of the test. . Do not eat any food 4 hours prior to the test. . You may take your regular medications prior to the test.  . Take metoprolol (Lopressor) two hours prior to test. . HOLD Furosemide/Hydrochlorothiazide morning of the test. . FEMALES- please wear underwire-free bra if available      After the Test: . Drink plenty of water. . After receiving IV contrast, you may experience a mild flushed  feeling. This is normal. . On occasion, you may experience a mild rash up to 24 hours after the test. This is not dangerous. If this occurs, you can take Benadryl 25 mg and increase your fluid intake. . If you experience trouble breathing, this can be serious. If it is severe call 911 IMMEDIATELY. If it is mild, please call our  office. . If you take any of these medications: Glipizide/Metformin, Avandament, Glucavance, please do not take 48 hours after completing test unless otherwise instructed.   Once we have confirmed authorization from your insurance company, we will call you to set up a date and time for your test. Based on how quickly your insurance processes prior authorizations requests, please allow up to 4 weeks to be contacted for scheduling your Cardiac CT appointment. Be advised that routine Cardiac CT appointments could be scheduled as many as 8 weeks after your provider has ordered it.  For non-scheduling related questions, please contact the cardiac imaging nurse navigator should you have any questions/concerns: Marchia Bond, Cardiac Imaging Nurse Navigator Burley Saver, Interim Cardiac Imaging Nurse [place] and Vascular Services Direct Office Dial: [phone]   For scheduling needs, including cancellations and rescheduling, please call [place], [phone].

## 2021-02-02 ENCOUNTER — Telehealth: Payer: Self-pay | Admitting: Family Medicine

## 2021-02-03 ENCOUNTER — Other Ambulatory Visit (HOSPITAL_COMMUNITY): Payer: Self-pay | Admitting: *Deleted

## 2021-02-03 ENCOUNTER — Telehealth (HOSPITAL_COMMUNITY): Payer: Self-pay | Admitting: *Deleted

## 2021-02-03 NOTE — Telephone Encounter (Signed)
Reaching out to patient to offer assistance regarding upcoming cardiac imaging study; pt verbalizes understanding of appt date/time, parking situation and where to check in, pre-test NPO status and medications ordered, and verified current allergies; name and call back number provided for further questions should they arise  Duvid Smalls RN Navigator Cardiac Imaging [place] [phone] office [phone] cell  

## 2021-02-04 ENCOUNTER — Ambulatory Visit (HOSPITAL_COMMUNITY)
Admission: RE | Admit: 2021-02-04 | Discharge: 2021-02-04 | Disposition: A | Discharge status: Home / Self Care | Payer: Medicare Other | Source: Ambulatory Visit | Attending: Cardiovascular Disease | Admitting: Cardiovascular Disease

## 2021-02-04 ENCOUNTER — Other Ambulatory Visit: Payer: Self-pay

## 2021-02-04 DIAGNOSIS — R079 Chest pain, unspecified: Secondary | ICD-10-CM | POA: Insufficient documentation

## 2021-02-04 DIAGNOSIS — R072 Precordial pain: Secondary | ICD-10-CM | POA: Insufficient documentation

## 2021-02-04 DIAGNOSIS — R55 Syncope and collapse: Secondary | ICD-10-CM | POA: Diagnosis not present

## 2021-02-04 DIAGNOSIS — Z006 Encounter for examination for normal comparison and control in clinical research program: Secondary | ICD-10-CM

## 2021-02-04 MED ORDER — IOHEXOL 350 MG/ML SOLN
80.0000 mL | Freq: Once | INTRAVENOUS | Status: AC | PRN
  Administered 2021-02-04: 80 mL via INTRAVENOUS

## 2021-02-04 MED ORDER — NITROGLYCERIN 0.4 MG SL SUBL
0.8000 mg | SUBLINGUAL_TABLET | Freq: Once | SUBLINGUAL | Status: AC
  Administered 2021-02-04: 0.8 mg via SUBLINGUAL

## 2021-02-04 MED ORDER — NITROGLYCERIN 0.4 MG SL SUBL
SUBLINGUAL_TABLET | SUBLINGUAL | Status: AC
  Filled 2021-02-04: qty 2

## 2021-02-04 NOTE — Research (Signed)
IDENTIFY Informed Consent                  Subject Name: Bethany [name]   Subject met inclusion and exclusion criteria.  The informed consent form, study requirements and expectations were reviewed with the subject and questions and concerns were addressed prior to the signing of the consent form.  The subject verbalized understanding of the trial requirements.  The subject agreed to participate in the IDENTIFY trial and signed the informed consent at 12:02PM on 02/04/21.  The informed consent was obtained prior to performance of any protocol-specific procedures for the subject.  A copy of the signed informed consent was given to the subject and a copy was placed in the subject's medical record.   Meade Maw, Naval architect

## 2021-02-06 ENCOUNTER — Other Ambulatory Visit: Payer: Self-pay

## 2021-02-06 ENCOUNTER — Ambulatory Visit (HOSPITAL_COMMUNITY)
Admission: RE | Admit: 2021-02-06 | Discharge: 2021-02-06 | Disposition: A | Discharge status: Home / Self Care | Payer: Medicare Other | Source: Ambulatory Visit | Attending: Family Medicine | Admitting: Family Medicine

## 2021-02-06 DIAGNOSIS — R42 Dizziness and giddiness: Secondary | ICD-10-CM | POA: Diagnosis not present

## 2021-02-06 DIAGNOSIS — H81311 Aural vertigo, right ear: Secondary | ICD-10-CM | POA: Insufficient documentation

## 2021-02-06 DIAGNOSIS — G44031 Episodic paroxysmal hemicrania, intractable: Secondary | ICD-10-CM | POA: Diagnosis not present

## 2021-02-06 DIAGNOSIS — G44039 Episodic paroxysmal hemicrania, not intractable: Secondary | ICD-10-CM | POA: Diagnosis not present

## 2021-02-06 MED ORDER — GADOBUTROL 1 MMOL/ML IV SOLN
10.0000 mL | Freq: Once | INTRAVENOUS | Status: AC | PRN
  Administered 2021-02-06: 10 mL via INTRAVENOUS

## 2021-02-06 MED ORDER — ROSUVASTATIN CALCIUM 20 MG PO TABS: 20.0000 mg | ORAL_TABLET | Freq: Every day | ORAL | 3 refills | Status: DC

## 2021-02-06 MED ORDER — ASPIRIN EC 81 MG PO TBEC: 81.0000 mg | DELAYED_RELEASE_TABLET | Freq: Every day | ORAL | 3 refills | Status: DC

## 2021-02-18 ENCOUNTER — Other Ambulatory Visit (HOSPITAL_COMMUNITY): Payer: Medicare Other

## 2021-02-23 DIAGNOSIS — M9904 Segmental and somatic dysfunction of sacral region: Secondary | ICD-10-CM | POA: Diagnosis not present

## 2021-02-23 DIAGNOSIS — M9901 Segmental and somatic dysfunction of cervical region: Secondary | ICD-10-CM | POA: Diagnosis not present

## 2021-02-23 DIAGNOSIS — M62830 Muscle spasm of back: Secondary | ICD-10-CM | POA: Diagnosis not present

## 2021-02-23 DIAGNOSIS — M9903 Segmental and somatic dysfunction of lumbar region: Secondary | ICD-10-CM | POA: Diagnosis not present

## 2021-03-07 NOTE — Addendum Note (Signed)
Encounter addended by: Annie Paras on: 03/07/2021 4:20 PM  Actions taken: Letter saved

## 2021-03-19 ENCOUNTER — Ambulatory Visit (INDEPENDENT_AMBULATORY_CARE_PROVIDER_SITE_OTHER): Payer: Medicare Other

## 2021-03-19 DIAGNOSIS — R079 Chest pain, unspecified: Secondary | ICD-10-CM

## 2021-03-19 LAB — ECHOCARDIOGRAM COMPLETE
Area-P 1/2: 1.94 cm2
Calc EF: 50.4 %
S' Lateral: 3.21 cm
Single Plane A2C EF: 48.9 %
Single Plane A4C EF: 50.1 %

## 2021-04-01 ENCOUNTER — Telehealth: Payer: Self-pay | Admitting: Cardiovascular Disease

## 2021-04-01 DIAGNOSIS — U071 COVID-19: Secondary | ICD-10-CM | POA: Diagnosis not present

## 2021-04-01 NOTE — Telephone Encounter (Signed)
Called patient, advised of results. Patient verbalized understanding.  

## 2021-04-01 NOTE — Telephone Encounter (Signed)
Patient was returning phone call for results 

## 2021-04-07 DIAGNOSIS — M9901 Segmental and somatic dysfunction of cervical region: Secondary | ICD-10-CM | POA: Diagnosis not present

## 2021-04-07 DIAGNOSIS — M62830 Muscle spasm of back: Secondary | ICD-10-CM | POA: Diagnosis not present

## 2021-04-07 DIAGNOSIS — M9903 Segmental and somatic dysfunction of lumbar region: Secondary | ICD-10-CM | POA: Diagnosis not present

## 2021-04-07 DIAGNOSIS — M9904 Segmental and somatic dysfunction of sacral region: Secondary | ICD-10-CM | POA: Diagnosis not present

## 2021-04-08 DIAGNOSIS — M9903 Segmental and somatic dysfunction of lumbar region: Secondary | ICD-10-CM | POA: Diagnosis not present

## 2021-04-08 DIAGNOSIS — M9901 Segmental and somatic dysfunction of cervical region: Secondary | ICD-10-CM | POA: Diagnosis not present

## 2021-04-08 DIAGNOSIS — M9904 Segmental and somatic dysfunction of sacral region: Secondary | ICD-10-CM | POA: Diagnosis not present

## 2021-04-08 DIAGNOSIS — M62830 Muscle spasm of back: Secondary | ICD-10-CM | POA: Diagnosis not present

## 2021-04-17 ENCOUNTER — Encounter: Payer: Self-pay | Admitting: Family Medicine

## 2021-04-17 ENCOUNTER — Ambulatory Visit (INDEPENDENT_AMBULATORY_CARE_PROVIDER_SITE_OTHER): Payer: Medicare Other | Admitting: Family Medicine

## 2021-04-17 ENCOUNTER — Other Ambulatory Visit: Payer: Self-pay

## 2021-04-17 VITALS — BP 134/85 | HR 58 | Temp 97.2°F | Ht 65.0 in | Wt 200.1 lb

## 2021-04-17 DIAGNOSIS — M25532 Pain in left wrist: Secondary | ICD-10-CM | POA: Diagnosis not present

## 2021-04-17 DIAGNOSIS — H6121 Impacted cerumen, right ear: Secondary | ICD-10-CM

## 2021-04-17 DIAGNOSIS — M25531 Pain in right wrist: Secondary | ICD-10-CM | POA: Diagnosis not present

## 2021-04-17 DIAGNOSIS — B370 Candidal stomatitis: Secondary | ICD-10-CM

## 2021-04-17 MED ORDER — PREDNISONE 20 MG PO TABS: 20.0000 mg | ORAL_TABLET | Freq: Every day | ORAL | 0 refills | Status: DC

## 2021-04-17 MED ORDER — CLOTRIMAZOLE 10 MG MT TROC: 10.0000 mg | Freq: Every day | OROMUCOSAL | 0 refills | Status: DC

## 2021-04-17 NOTE — Patient Instructions (Addendum)
Debrox ear drops over the counter for ear wax impaction.    Earwax Buildup, Adult The ears produce a substance called earwax that helps keep bacteria out of the ear and protects the skin in the ear canal. Occasionally, earwax can build up in the ear and cause discomfort or hearing loss. What are the causes? This condition is caused by a buildup of earwax. Ear canals are self-cleaning. Ear wax is made in the outer part of the ear canal and generally falls out in small amounts over time. When the self-cleaning mechanism is not working, earwax builds up and can cause decreased hearing and discomfort. Attempting to clean ears with cotton swabs can push the earwax deep into the ear canal and cause decreased hearing and pain. What increases the risk? This condition is more likely to develop in people who:  Clean their ears often with cotton swabs.  Pick at their ears.  Use earplugs or in-ear headphones often, or wear hearing aids. The following factors may also make you more likely to develop this condition:  Being female.  Being of older age.  Naturally producing more earwax.  Having narrow ear canals.  Having earwax that is overly thick or sticky.  Having excess hair in the ear canal.  Having eczema.  Being dehydrated. What are the signs or symptoms? Symptoms of this condition include:  Reduced or muffled hearing.  A feeling of fullness in the ear or feeling that the ear is plugged.  Fluid coming from the ear.  Ear pain or an itchy ear.  Ringing in the ear.  Coughing.  Balance problems.  An obvious piece of earwax that can be seen inside the ear canal. How is this diagnosed? This condition may be diagnosed based on:  Your symptoms.  Your medical history.  An ear exam. During the exam, your health care provider will look into your ear with an instrument called an otoscope. You may have tests, including a hearing test. How is this treated? This condition may be  treated by:  Using ear drops to soften the earwax.  Having the earwax removed by a health care provider. The health care provider may: ? Flush the ear with water. ? Use an instrument that has a loop on the end (curette). ? Use a suction device.  Having surgery to remove the wax buildup. This may be done in severe cases. Follow these instructions at home:  Take over-the-counter and prescription medicines only as told by your health care provider.  Do not put any objects, including cotton swabs, into your ear. You can clean the opening of your ear canal with a washcloth or facial tissue.  Follow instructions from your health care provider about cleaning your ears. Do not overclean your ears.  Drink enough fluid to keep your urine pale yellow. This will help to thin the earwax.  Keep all follow-up visits as told. If earwax builds up in your ears often or if you use hearing aids, consider seeing your health care provider for routine, preventive ear cleanings. Ask your health care provider how often you should schedule your cleanings.  If you have hearing aids, clean them according to instructions from the manufacturer and your health care provider.   Contact a health care provider if:  You have ear pain.  You develop a fever.  You have pus or other fluid coming from your ear.  You have hearing loss.  You have ringing in your ears that does not go away.  You feel like the room is spinning (vertigo).  Your symptoms do not improve with treatment. Get help right away if:  You have bleeding from the affected ear.  You have severe ear pain. Summary  Earwax can build up in the ear and cause discomfort or hearing loss.  The most common symptoms of this condition include reduced or muffled hearing, a feeling of fullness in the ear, or feeling that the ear is plugged.  This condition may be diagnosed based on your symptoms, your medical history, and an ear exam.  This condition  may be treated by using ear drops to soften the earwax or by having the earwax removed by a health care provider.  Do not put any objects, including cotton swabs, into your ear. You can clean the opening of your ear canal with a washcloth or facial tissue. This information is not intended to replace advice given to you by your health care provider. Make sure you discuss any questions you have with your health care provider. Document Revised: 04/01/2020 Document Reviewed: 04/01/2020 Elsevier Patient Education  [place].

## 2021-04-17 NOTE — Progress Notes (Signed)
Acute Office Visit  Subjective:    Patient ID: Bethany [name], female    DOB: 1951/10/26, 70 y.o.   MRN: [id number]  Chief Complaint  Patient presents with  . Ear Fullness  . Numbness    HPI Patient is in today for right ear fullness x 2 weeks. Denies fever, drainage, pain, or nasal congestion. She has some vertigo at baseline but this has been a little worse since the fullness in her ear started. She has not tried any remedies.   She also report pain in both wrists. She describes it as a intermittent sharp shooting pain like an electrical shock that starts on the medial side of her wrists and travels up both thumbs. She take tylenol with some relief. She denies erythema, swelling, decreased strength or ROM. Denies injury.   She reports frequent oral thrush. She has taken clotrimazole before with good relief. She feels like she has thrush now.   Past Medical History:  Diagnosis Date  . Allergy 1997   knees  . Arthritis   . Diabetes mellitus without complication ([place])   . GERD (gastroesophageal reflux disease)   . Hyperlipidemia   . Hypertension   . Obesity   . Pneumonia    1 month ago - rsolved  . Seasonal allergies   . Sleep apnea    cpap    Past Surgical History:  Procedure Laterality Date  . ABDOMINAL HYSTERECTOMY    . BREAST SURGERY     fibroid cysts removed  . GANGLION CYST EXCISION     left  . phlebectomy    . TOTAL KNEE ARTHROPLASTY Right 10/22/2013   Procedure: RIGHT TOTAL KNEE ARTHROPLASTY;  Surgeon: Gearlean Alf, MD;  Location: [facility];  Service: Orthopedics;  Laterality: Right;  . TOTAL KNEE ARTHROPLASTY Left 06/11/2015   Procedure: LEFT TOTAL KNEE ARTHROPLASTY;  Surgeon: Gaynelle Arabian, MD;  Location: [facility];  Service: Orthopedics;  Laterality: Left;  . TUBAL LIGATION      Family History  Problem Relation Age of Onset  . Heart disease Mother   . Early death Father        Shooting  . Diabetes Sister   . Arthritis Brother        knees  . Heart  disease Sister        tumor in the heart    Social History   Socioeconomic History  . Marital status: Married    Spouse name: Not on file  . Number of children: 4  . Years of education: 94  . Highest education level: High school graduate  Occupational History  . Occupation: retired    Fish farm manager: [employer]  Tobacco Use  . Smoking status: Never Smoker  . Smokeless tobacco: Never Used  Vaping Use  . Vaping Use: Never used  Substance and Sexual Activity  . Alcohol use: No  . Drug use: No  . Sexual activity: Not Currently    Comment: husband not able to have  sex  Other Topics Concern  . Not on file  Social History Narrative  . Not on file   Social Determinants of Health   Financial Resource Strain: Low Risk   . Difficulty of Paying Living Expenses: Not hard at all  Food Insecurity: No Food Insecurity  . Worried About Charity fundraiser in the Last Year: Never true  . Ran Out of Food in the Last Year: Never true  Transportation Needs: No Transportation Needs  . Lack of Transportation (Medical):  No  . Lack of Transportation (Non-Medical): No  Physical Activity: Sufficiently Active  . Days of Exercise per Week: 7 days  . Minutes of Exercise per Session: 30 min  Stress: No Stress Concern Present  . Feeling of Stress : Not at all  Social Connections: Socially Integrated  . Frequency of Communication with Friends and Family: More than three times a week  . Frequency of Social Gatherings with Friends and Family: More than three times a week  . Attends Religious Services: More than 4 times per year  . Active Member of Clubs or Organizations: Yes  . Attends Archivist Meetings: More than 4 times per year  . Marital Status: Married  Human resources officer Violence: Not At Risk  . Fear of Current or Ex-Partner: No  . Emotionally Abused: No  . Physically Abused: No  . Sexually Abused: No    Outpatient Medications Prior to Visit  Medication Sig Dispense Refill  .  acyclovir (ZOVIRAX) 200 MG capsule TAKE 1 CAPSULE EVERY DAY AS NEEDED FOR  FLAREUPS 90 capsule 1  . aspirin EC 81 MG tablet Take 1 tablet (81 mg total) by mouth daily. Swallow whole. 90 tablet 3  . diclofenac (VOLTAREN) 75 MG EC tablet Take 1 tablet (75 mg total) by mouth 2 (two) times daily. For muscle and  Joint pain 180 tablet 1  . fluticasone (FLONASE) 50 MCG/ACT nasal spray Place 2 sprays into both nostrils as needed. 48 g 1  . furosemide (LASIX) 20 MG tablet Take 1 tablet (20 mg total) by mouth every morning. 90 tablet 1  . meclizine (ANTIVERT) 25 MG tablet TAKE 1 TABLET THREE TIMES DAILY AS NEEDED  FOR  DIZZINESS 270 tablet 1  . metoprolol tartrate (LOPRESSOR) 100 MG tablet Take 1 tablet by mouth once for procedure. 1 tablet 0  . olmesartan (BENICAR) 20 MG tablet Take 1 tablet (20 mg total) by mouth daily. 90 tablet 1  . rosuvastatin (CRESTOR) 20 MG tablet Take 1 tablet (20 mg total) by mouth daily. 90 tablet 3  . VENTOLIN HFA 108 (90 Base) MCG/ACT inhaler INHALE 2 PUFFS BY MOUTH EVERY 6 HOURS AS NEEDED FOR WHEEZING OR SHORTNESS OF BREATH 18 g 3   No facility-administered medications prior to visit.    Allergies  Allergen Reactions  . Hctz [Hydrochlorothiazide]     Hair loss    Review of Systems As per HPI.     Objective:    Physical Exam Vitals and nursing note reviewed.  Constitutional:      General: She is not in acute distress.    Appearance: She is not ill-appearing, toxic-appearing or diaphoretic.  HENT:     Right Ear: Ear canal and external ear normal. There is impacted cerumen.     Nose: Nose normal.     Mouth/Throat:     Mouth: Mucous membranes are moist.     Comments: Thrush present.  Musculoskeletal:     Right wrist: No swelling, deformity, tenderness or bony tenderness. Normal range of motion.     Left wrist: No swelling, deformity, tenderness or bony tenderness. Normal range of motion.     Right hand: Normal.     Left hand: Normal.     Right lower leg: No  edema.     Left lower leg: No edema.     Comments: Negative Phalen's test.   Skin:    General: Skin is warm and dry.  Neurological:     General: No focal deficit present.  Mental Status: She is alert and oriented to person, place, and time.      BP 134/85   Pulse (!) 58   Temp (!) 97.2 F (36.2 C) (Temporal)   Ht 5\' 5"  (1.651 m)   Wt 200 lb 2 oz (90.8 kg)   BMI 33.30 kg/m  Wt Readings from Last 3 Encounters:  04/17/21 200 lb 2 oz (90.8 kg)  01/27/21 200 lb (90.7 kg)  01/22/21 200 lb 6.4 oz (90.9 kg)   Ear Cerumen Removal  Date/Time: 04/17/2021 9:41 AM Performed by: Gwenlyn Perking, FNP Authorized by: Gwenlyn Perking, FNP   Anesthesia: Local Anesthetic: none Location details: right ear Patient tolerance: patient tolerated the procedure well with no immediate complications Procedure type: irrigation  Sedation: Patient sedated: no     Health Maintenance Due  Topic Date Due  . COLONOSCOPY (Pts 45-68yrs Insurance coverage will need to be confirmed)  Never done  . COVID-19 Vaccine (3 - Booster for Moderna series) 09/25/2020    There are no preventive care reminders to display for this patient.   Lab Results  Component Value Date   TSH 2.830 01/22/2021   Lab Results  Component Value Date   WBC 8.6 01/22/2021   HGB 13.2 01/22/2021   HCT 39.6 01/22/2021   MCV 87 01/22/2021   PLT 273 01/22/2021   Lab Results  Component Value Date   NA 145 (H) 01/22/2021   K 4.1 01/22/2021   CO2 24 01/22/2021   GLUCOSE 94 01/22/2021   BUN 11 01/22/2021   CREATININE 0.95 01/22/2021   BILITOT 0.8 01/22/2021   ALKPHOS 79 01/22/2021   AST 17 01/22/2021   ALT 10 01/22/2021   PROT 7.5 01/22/2021   ALBUMIN 4.3 01/22/2021   CALCIUM 10.2 01/22/2021   ANIONGAP 6 06/13/2015   Lab Results  Component Value Date   CHOL 180 01/22/2021   Lab Results  Component Value Date   HDL 53 01/22/2021   Lab Results  Component Value Date   LDLCALC 112 (H) 01/22/2021   Lab  Results  Component Value Date   TRIG 78 01/22/2021   Lab Results  Component Value Date   CHOLHDL 3.4 01/22/2021   Lab Results  Component Value Date   HGBA1C 5.6 01/22/2021       Assessment & Plan:   Gwendola was seen today for ear fullness and numbness.  Diagnoses and all orders for this visit:  Impacted cerumen of right ear Right ear irrigation today in office. Patient tolerated well. TMs visualized as normal following procedure. Discussed Debrox ear drops OTC prn.  -     Ear Cerumen Removal  Bilateral wrist pain Continue tylenol prn. Prednisone burst as below. Return to office for new or worsening symptoms, or if symptoms persist.  -     predniSONE (DELTASONE) 20 MG tablet; Take 1 tablet (20 mg total) by mouth daily with breakfast.  Oral thrush Refill provided. Currently has oral thrush.  -     clotrimazole (MYCELEX) 10 MG troche; Take 1 tablet (10 mg total) by mouth 5 (five) times daily.   Return to office for new or worsening symptoms, or if symptoms persist.   The patient indicates understanding of these issues and agrees with the plan.  Gwenlyn Perking, FNP

## 2021-04-26 NOTE — Progress Notes (Signed)
Cardiology Office Note:   Date:  04/27/2021  NAME:  Bethany [name]    MRN: [id number] DOB:  Apr 11, 1951   PCP:  Claretta Fraise, MD  Cardiologist:  No primary care provider on file.   Referring MD: Claretta Fraise, MD   Chief Complaint  Patient presents with  . Follow-up    History of Present Illness:   Bethany [name] is a 70 y.o. female with a hx of HTN, HLD who presents for follow-up. Seen for syncope which was non-cardiac. Echo normal. CCTA with minimal CAD.  She reports has had no further syncopal events. Me she had 2 syncopal events in 2021.  She had an episode in May 2021.  This occurred after a 5-hour drive to the beach.  She got dizzy after getting out of the car.  She had not had much water to drink.  She also had a second event December 2021.  This occurred while eating.  Both episodes appear to have been vasovagal in nature.  Her echocardiogram was normal.  Coronary CTA showed minimal CAD.  She reports that she has been constantly dizzy since that time.  She reports this occurs intermittently.  Blood pressure elevated 164/98 today.  She reports that she did not take her blood pressure medication.  She has not been checking her blood pressure regularly or exercising.  I suspect her symptoms could be blood pressure related.  She denies any abnormal chest pain or shortness of breath.  She overall appears to be back to normal with no further episodes.  She is drinking plenty of water as well.  Her symptoms also could have been related to metformin.  Her A1c was 5.6.  We stopped her metformin.  We did switch her to Crestor.  She will need repeat labs at some point.  Problem List 1. HTN 2. HLD -T chol 180, HDL 53, LDL 112, TG 78 -A1c 5.6 3. Obesity -BMI 33 4. Minimal CAD -CAC score 26 (63rd percentile) -<25% LAD/LCX  Past Medical History: Past Medical History:  Diagnosis Date  . Allergy 1997   knees  . Arthritis   . Diabetes mellitus without complication ([place])   . GERD  (gastroesophageal reflux disease)   . Hyperlipidemia   . Hypertension   . Obesity   . Pneumonia    1 month ago - rsolved  . Seasonal allergies   . Sleep apnea    cpap    Past Surgical History: Past Surgical History:  Procedure Laterality Date  . ABDOMINAL HYSTERECTOMY    . BREAST SURGERY     fibroid cysts removed  . GANGLION CYST EXCISION     left  . phlebectomy    . TOTAL KNEE ARTHROPLASTY Right 10/22/2013   Procedure: RIGHT TOTAL KNEE ARTHROPLASTY;  Surgeon: Gearlean Alf, MD;  Location: [facility];  Service: Orthopedics;  Laterality: Right;  . TOTAL KNEE ARTHROPLASTY Left 06/11/2015   Procedure: LEFT TOTAL KNEE ARTHROPLASTY;  Surgeon: Gaynelle Arabian, MD;  Location: [facility];  Service: Orthopedics;  Laterality: Left;  . TUBAL LIGATION      Current Medications: Current Meds  Medication Sig  . acyclovir (ZOVIRAX) 200 MG capsule TAKE 1 CAPSULE EVERY DAY AS NEEDED FOR  FLAREUPS  . aspirin EC 81 MG tablet Take 1 tablet (81 mg total) by mouth daily. Swallow whole.  . clotrimazole (MYCELEX) 10 MG troche Take 1 tablet (10 mg total) by mouth 5 (five) times daily.  . diclofenac (VOLTAREN) 75 MG EC tablet Take 1 tablet (75  mg total) by mouth 2 (two) times daily. For muscle and  Joint pain  . fluticasone (FLONASE) 50 MCG/ACT nasal spray Place 2 sprays into both nostrils as needed.  . furosemide (LASIX) 20 MG tablet Take 1 tablet (20 mg total) by mouth every morning.  . meclizine (ANTIVERT) 25 MG tablet TAKE 1 TABLET THREE TIMES DAILY AS NEEDED  FOR  DIZZINESS  . olmesartan (BENICAR) 20 MG tablet Take 1 tablet (20 mg total) by mouth daily.  . predniSONE (DELTASONE) 20 MG tablet Take 1 tablet (20 mg total) by mouth daily with breakfast.  . rosuvastatin (CRESTOR) 20 MG tablet Take 1 tablet (20 mg total) by mouth daily.  . VENTOLIN HFA 108 (90 Base) MCG/ACT inhaler INHALE 2 PUFFS BY MOUTH EVERY 6 HOURS AS NEEDED FOR WHEEZING OR SHORTNESS OF BREATH     Allergies:    Hctz [hydrochlorothiazide]    Social History: Social History   Socioeconomic History  . Marital status: Married    Spouse name: Not on file  . Number of children: 4  . Years of education: 72  . Highest education level: High school graduate  Occupational History  . Occupation: retired    Fish farm manager: [employer]  Tobacco Use  . Smoking status: Never Smoker  . Smokeless tobacco: Never Used  Vaping Use  . Vaping Use: Never used  Substance and Sexual Activity  . Alcohol use: No  . Drug use: No  . Sexual activity: Not Currently    Comment: husband not able to have  sex  Other Topics Concern  . Not on file  Social History Narrative  . Not on file   Social Determinants of Health   Financial Resource Strain: Low Risk   . Difficulty of Paying Living Expenses: Not hard at all  Food Insecurity: No Food Insecurity  . Worried About Charity fundraiser in the Last Year: Never true  . Ran Out of Food in the Last Year: Never true  Transportation Needs: No Transportation Needs  . Lack of Transportation (Medical): No  . Lack of Transportation (Non-Medical): No  Physical Activity: Sufficiently Active  . Days of Exercise per Week: 7 days  . Minutes of Exercise per Session: 30 min  Stress: No Stress Concern Present  . Feeling of Stress : Not at all  Social Connections: Socially Integrated  . Frequency of Communication with Friends and Family: More than three times a week  . Frequency of Social Gatherings with Friends and Family: More than three times a week  . Attends Religious Services: More than 4 times per year  . Active Member of Clubs or Organizations: Yes  . Attends Archivist Meetings: More than 4 times per year  . Marital Status: Married    Family History: The patient's family history includes Arthritis in her brother; Diabetes in her sister; Early death in her father; Heart disease in her mother and sister.  ROS:   All other ROS reviewed and negative. Pertinent positives noted in the HPI.      EKGs/Labs/Other Studies Reviewed:   The following studies were personally reviewed by me today:  CCTA 02/04/2021 1. Coronary calcium score of 26. This was 63rd percentile for age and sex matched controls. 2. Normal coronary origin with right dominance. 3. Minimal CAD (<25%) in the LAD/LCX. 4. Aorta measures up to 40 mm which is likely normal for age.  TTE 03/19/2021 1. Left ventricular ejection fraction, by estimation, is 55 to 60%. The  left ventricle  has normal function. The left ventricle has no regional  wall motion abnormalities. Left ventricular diastolic parameters are  indeterminate. Strain assessment does not  track completely.  2. Right ventricular systolic function is normal. The right ventricular  size is normal. There is normal pulmonary artery systolic pressure. The  estimated right ventricular systolic pressure is 35.5 mmHg.  3. The mitral valve is grossly normal. Trivial mitral valve  regurgitation.  4. The aortic valve is tricuspid. There is mild calcification of the  aortic valve. Aortic valve regurgitation is not visualized.  5. Aortic dilatation noted. There is mild dilatation of the ascending  aorta, measuring 40 mm.  6. The inferior vena cava is normal in size with greater than 50%  respiratory variability, suggesting right atrial pressure of 3 mmHg.   Recent Labs: 01/22/2021: ALT 10; BUN 11; Creatinine, Ser 0.95; Hemoglobin 13.2; Platelets 273; Potassium 4.1; Sodium 145; TSH 2.830   Recent Lipid Panel    Component Value Date/Time   CHOL 180 01/22/2021 0930   CHOL 180 07/02/2013 0852   TRIG 78 01/22/2021 0930   TRIG 105 07/02/2013 0852   HDL 53 01/22/2021 0930   HDL 40 07/02/2013 0852   CHOLHDL 3.4 01/22/2021 0930   LDLCALC 112 (H) 01/22/2021 0930   LDLCALC 119 (H) 07/02/2013 0852    Physical Exam:   VS:  BP (!) 164/98   Pulse 78   Ht 5\' 5"  (1.651 m)   Wt 204 lb 3.2 oz (92.6 kg)   SpO2 95%   BMI 33.98 kg/m    Wt Readings from Last 3  Encounters:  04/27/21 204 lb 3.2 oz (92.6 kg)  04/17/21 200 lb 2 oz (90.8 kg)  01/27/21 200 lb (90.7 kg)    General: Well nourished, well developed, in no acute distress Head: Atraumatic, normal size  Eyes: PEERLA, EOMI  Neck: Supple, no JVD Endocrine: No thryomegaly Cardiac: Normal S1, S2; RRR; no murmurs, rubs, or gallops Lungs: Clear to auscultation bilaterally, no wheezing, rhonchi or rales  Abd: Soft, nontender, no hepatomegaly  Ext: No edema, pulses 2+ Musculoskeletal: No deformities, BUE and BLE strength normal and equal Skin: Warm and dry, no rashes   Neuro: Alert and oriented to person, place, time, and situation, CNII-XII grossly intact, no focal deficits  Psych: Normal mood and affect   ASSESSMENT:   Bethany [name] is a 70 y.o. female who presents for the following: 1. Agatston coronary artery calcium score less than 100   2. Mixed hyperlipidemia   3. Dizziness     PLAN:   1. Agatston coronary artery calcium score less than 100 2. Mixed hyperlipidemia -Evaluated for syncopal events.  They were associated with likely vasovagal events.  She had no chest pain or shortness of breath prior to the events.  Her coronary CTA showed minimal CAD.  Echocardiogram was normal.  Episodes are not related to cardiac pathology. -Moving forward she should continue aspirin 81 mg daily.  We switched her to Crestor 20 mg daily.  She is taking this.  Would aim for a goal LDL cholesterol less than 70.  We stopped her pravastatin and this likely will help.  3. Dizziness -Continues to have persistent dizziness.  She had 2 syncopal events in 2021.  1 occurred after a 5-hour car drive.  She had not had much to drink that day.  The second event was related to eating.  No sinister symptoms.  Coronary CTA with no obstructive CAD.  Echocardiogram normal LV function.  I  think her events were vasovagal.  I think her dizziness could be related to blood pressure.  Her BP is elevated today.  She did not take  her medication.  I would like for her to start taking her medication.  I do not see a cardiac reason for her dizziness.  Her EKG shows sinus rhythm with narrow QRS.  She has no evidence of conduction disease.  We will continue to monitor this.  Disposition: Return in about 1 year (around 04/27/2022).  Medication Adjustments/Labs and Tests Ordered: Current medicines are reviewed at length with the patient today.  Concerns regarding medicines are outlined above.  No orders of the defined types were placed in this encounter.  No orders of the defined types were placed in this encounter.   Patient Instructions  Medication Instructions:  The current medical regimen is effective;  continue present plan and medications.  *If you need a refill on your cardiac medications before your next appointment, please call your pharmacy*  Follow-Up: At [facility], you and your health needs are our priority.  As part of our continuing mission to provide you with exceptional heart care, we have created designated Provider Care Teams.  These Care Teams include your primary Cardiologist (physician) and Advanced Practice Providers (APPs -  Physician Assistants and Nurse Practitioners) who all work together to provide you with the care you need, when you need it.  We recommend signing up for the patient portal called "MyChart".  Sign up information is provided on this After Visit Summary.  MyChart is used to connect with patients for Virtual Visits (Telemedicine).  Patients are able to view lab/test results, encounter notes, upcoming appointments, etc.  Non-urgent messages can be sent to your provider as well.   To learn more about what you can do with MyChart, go to [email or web address].    Your next appointment:   12 month(s)  The format for your next appointment:   In Person  Provider:   Eleonore Chiquito, MD   Other Instructions Check BP log x1 daily (use log provided)      Time Spent with Patient:  I have spent a total of 25 minutes with patient reviewing hospital notes, telemetry, EKGs, labs and examining the patient as well as establishing an assessment and plan that was discussed with the patient.  > 50% of time was spent in direct patient care.  Signed, Addison Naegeli. Audie Box, MD, [place]  [address], [place], [place] [address] [phone]  04/27/2021 9:38 AM

## 2021-04-27 ENCOUNTER — Encounter: Payer: Self-pay | Admitting: Cardiovascular Disease

## 2021-04-27 ENCOUNTER — Other Ambulatory Visit: Payer: Self-pay

## 2021-04-27 ENCOUNTER — Ambulatory Visit (INDEPENDENT_AMBULATORY_CARE_PROVIDER_SITE_OTHER): Payer: Medicare Other | Admitting: Cardiovascular Disease

## 2021-04-27 VITALS — BP 164/98 | HR 78 | Ht 65.0 in | Wt 204.2 lb

## 2021-04-27 DIAGNOSIS — E782 Mixed hyperlipidemia: Secondary | ICD-10-CM | POA: Diagnosis not present

## 2021-04-27 DIAGNOSIS — R42 Dizziness and giddiness: Secondary | ICD-10-CM | POA: Diagnosis not present

## 2021-04-27 DIAGNOSIS — R931 Abnormal findings on diagnostic imaging of heart and coronary circulation: Secondary | ICD-10-CM | POA: Diagnosis not present

## 2021-04-27 NOTE — Patient Instructions (Signed)
Medication Instructions:  The current medical regimen is effective;  continue present plan and medications.  *If you need a refill on your cardiac medications before your next appointment, please call your pharmacy*  Follow-Up: At [facility], you and your health needs are our priority.  As part of our continuing mission to provide you with exceptional heart care, we have created designated Provider Care Teams.  These Care Teams include your primary Cardiologist (physician) and Advanced Practice Providers (APPs -  Physician Assistants and Nurse Practitioners) who all work together to provide you with the care you need, when you need it.  We recommend signing up for the patient portal called "MyChart".  Sign up information is provided on this After Visit Summary.  MyChart is used to connect with patients for Virtual Visits (Telemedicine).  Patients are able to view lab/test results, encounter notes, upcoming appointments, etc.  Non-urgent messages can be sent to your provider as well.   To learn more about what you can do with MyChart, go to [email or web address].    Your next appointment:   12 month(s)  The format for your next appointment:   In Person  Provider:   Eleonore Chiquito, MD   Other Instructions Check BP log x1 daily (use log provided)

## 2021-04-28 ENCOUNTER — Other Ambulatory Visit: Payer: Self-pay | Admitting: Family Medicine

## 2021-04-28 DIAGNOSIS — M1710 Unilateral primary osteoarthritis, unspecified knee: Secondary | ICD-10-CM

## 2021-04-28 DIAGNOSIS — M179 Osteoarthritis of knee, unspecified: Secondary | ICD-10-CM

## 2021-05-22 ENCOUNTER — Telehealth: Payer: Self-pay

## 2021-05-22 DIAGNOSIS — Z006 Encounter for examination for normal comparison and control in clinical research program: Secondary | ICD-10-CM

## 2021-05-22 NOTE — Telephone Encounter (Signed)
Called patient for 90 day Identify phone call pt stated she is not having anymore cardiac symptoms but did follow up with PCP, I reminded the patient that we would be calling her back around February for a year follow up phone call.  

## 2021-06-09 ENCOUNTER — Ambulatory Visit (INDEPENDENT_AMBULATORY_CARE_PROVIDER_SITE_OTHER): Payer: Medicare Other

## 2021-06-09 VITALS — Wt 204.0 lb

## 2021-06-09 DIAGNOSIS — Z1231 Encounter for screening mammogram for malignant neoplasm of breast: Secondary | ICD-10-CM | POA: Diagnosis not present

## 2021-06-09 DIAGNOSIS — Z0000 Encounter for general adult medical examination without abnormal findings: Secondary | ICD-10-CM | POA: Diagnosis not present

## 2021-06-09 NOTE — Progress Notes (Signed)
Subjective:   Bethany [name] is a 70 y.o. female who presents for Medicare Annual (Subsequent) preventive examination.  Virtual Visit via Telephone Note  I connected with  Bethany [name] on 06/09/21 at  8:15 AM EDT by telephone and verified that I am speaking with the correct person using two identifiers.  Location: Patient: Home Provider: [facility] Persons participating in the virtual visit: patient/Nurse Health Advisor   I discussed the limitations, risks, security and privacy concerns of performing an evaluation and management service by telephone and the availability of in person appointments. The patient expressed understanding and agreed to proceed.  Interactive audio and video telecommunications were attempted between this nurse and patient, however failed, due to patient having technical difficulties OR patient did not have access to video capability.  We continued and completed visit with audio only.  Some vital signs may be absent or patient reported.   Bethany Opfer E Deaja Rizo, LPN   Review of Systems     Cardiac Risk Factors include: advanced age (>70men, >57 women);dyslipidemia;hypertension;obesity (BMI >30kg/m2)     Objective:    Today's Vitals   06/09/21 0823  Weight: 204 lb (92.5 kg)   Body mass index is 33.95 kg/m.  Advanced Directives 06/09/2021 06/06/2020 06/05/2019 06/08/2018 06/27/2015 06/25/2015 06/11/2015  Does Patient Have a Medical Advance Directive? Yes Yes Yes Yes No Yes Yes  Type of Paramedic of [place];Living will Living will [place];Living will - - Healthcare Power of Attorney;Mental [place];Living will  Does patient want to make changes to medical advance directive? - No - Patient declined No - Patient declined - - - No - Patient declined  Copy of [place] in Chart? No - copy requested - No - copy requested - - - Yes  Pre-existing out of facility DNR order  (yellow form or pink MOST form) - - - - - - -    Current Medications (verified) Outpatient Encounter Medications as of 06/09/2021  Medication Sig   acyclovir (ZOVIRAX) 200 MG capsule TAKE 1 CAPSULE EVERY DAY AS NEEDED FOR  FLAREUPS   aspirin EC 81 MG tablet Take 1 tablet (81 mg total) by mouth daily. Swallow whole.   clotrimazole (MYCELEX) 10 MG troche Take 1 tablet (10 mg total) by mouth 5 (five) times daily.   diclofenac (VOLTAREN) 75 MG EC tablet TAKE 1 TABLET TWICE DAILY FOR MUSCLE AND JOINT PAIN   fluticasone (FLONASE) 50 MCG/ACT nasal spray Place 2 sprays into both nostrils as needed.   furosemide (LASIX) 20 MG tablet TAKE 1 TABLET (20 MG TOTAL) BY MOUTH EVERY MORNING.   meclizine (ANTIVERT) 25 MG tablet TAKE 1 TABLET THREE TIMES DAILY AS NEEDED  FOR  DIZZINESS   metoprolol tartrate (LOPRESSOR) 100 MG tablet Take 1 tablet by mouth once for procedure. (Patient not taking: Reported on 04/27/2021)   olmesartan (BENICAR) 20 MG tablet Take 1 tablet (20 mg total) by mouth daily.   predniSONE (DELTASONE) 20 MG tablet Take 1 tablet (20 mg total) by mouth daily with breakfast.   rosuvastatin (CRESTOR) 20 MG tablet Take 1 tablet (20 mg total) by mouth daily.   VENTOLIN HFA 108 (90 Base) MCG/ACT inhaler INHALE 2 PUFFS BY MOUTH EVERY 6 HOURS AS NEEDED FOR WHEEZING OR SHORTNESS OF BREATH   No facility-administered encounter medications on file as of 06/09/2021.    Allergies (verified) Hctz [hydrochlorothiazide]   History: Past Medical History:  Diagnosis Date   Allergy 1997  knees   Arthritis    Diabetes mellitus without complication (HCC)    GERD (gastroesophageal reflux disease)    Hyperlipidemia    Hypertension    Obesity    Pneumonia    1 month ago - rsolved   Seasonal allergies    Sleep apnea    cpap   Past Surgical History:  Procedure Laterality Date   ABDOMINAL HYSTERECTOMY     BREAST SURGERY     fibroid cysts removed   GANGLION CYST EXCISION     left   phlebectomy      TOTAL KNEE ARTHROPLASTY Right 10/22/2013   Procedure: RIGHT TOTAL KNEE ARTHROPLASTY;  Surgeon: Gearlean Alf, MD;  Location: [facility];  Service: Orthopedics;  Laterality: Right;   TOTAL KNEE ARTHROPLASTY Left 06/11/2015   Procedure: LEFT TOTAL KNEE ARTHROPLASTY;  Surgeon: Gaynelle Arabian, MD;  Location: [facility];  Service: Orthopedics;  Laterality: Left;   TUBAL LIGATION     Family History  Problem Relation Age of Onset   Heart disease Mother    Early death Father        Shooting   Diabetes Sister    Arthritis Brother        knees   Heart disease Sister        tumor in the heart   Social History   Socioeconomic History   Marital status: Married    Spouse name: Not on file   Number of children: 4   Years of education: 12   Highest education level: High school graduate  Occupational History   Occupation: retired    Fish farm manager: [employer]  Tobacco Use   Smoking status: Never   Smokeless tobacco: Never  Vaping Use   Vaping Use: Never used  Substance and Sexual Activity   Alcohol use: No   Drug use: No   Sexual activity: Not Currently    Comment: husband not able to have  sex  Other Topics Concern   Not on file  Social History Narrative   Lives home with her husband. Had 4 children, one passed away. Children live nearby, daughter lives next street over   Social Determinants of Health   Financial Resource Strain: Low Risk    Difficulty of Paying Living Expenses: Not hard at all  Food Insecurity: No Food Insecurity   Worried About Charity fundraiser in the Last Year: Never true   Arboriculturist in the Last Year: Never true  Transportation Needs: No Transportation Needs   Lack of Transportation (Medical): No   Lack of Transportation (Non-Medical): No  Physical Activity: Sufficiently Active   Days of Exercise per Week: 7 days   Minutes of Exercise per Session: 30 min  Stress: No Stress Concern Present   Feeling of Stress : Only a little  Social Connections: Engineer, maintenance of Communication with Friends and Family: More than three times a week   Frequency of Social Gatherings with Friends and Family: More than three times a week   Attends Religious Services: More than 4 times per year   Active Member of [employer] or Organizations: Yes   Attends Music therapist: More than 4 times per year   Marital Status: Married    Tobacco Counseling Counseling given: Not Answered   Clinical Intake:  Pre-visit preparation completed: Yes  Pain : No/denies pain     BMI - recorded: 33.95 Nutritional Status: BMI > 30  Obese Nutritional Risks: None Diabetes: No  How often do you need to have someone help you when you read instructions, pamphlets, or other written materials from your doctor or pharmacy?: 1 - Never  Diabetic?Pre-diabetes only  Interpreter Needed?: No  Information entered by :: Imajean Mcdermid, LPN   Activities of Daily Living In your present state of health, do you have any difficulty performing the following activities: 06/09/2021  Hearing? N  Vision? N  Difficulty concentrating or making decisions? N  Walking or climbing stairs? N  Dressing or bathing? N  Doing errands, shopping? N  Preparing Food and eating ? N  Using the Toilet? N  In the past six months, have you accidently leaked urine? N  Do you have problems with loss of bowel control? N  Managing your Medications? N  Managing your Finances? N  Housekeeping or managing your Housekeeping? N  Some recent data might be hidden    Patient Care Team: Claretta Fraise, MD as PCP - General (Family Medicine) Gaynelle Arabian, MD as Consulting Physician (Orthopedic Surgery) Ilean China, RN as Case Manager [place], Cassie Freer, MD as Consulting Physician (Cardiology)  Indicate any recent Medical Services you may have received from other than [facility] providers in the past year (date may be approximate).     Assessment:   This is a routine wellness  examination for Jeraldin.  Hearing/Vision screen Hearing Screening - Comments:: Denies hearing difficulties Vision Screening - Comments:: Wears eyeglasses - up to date with Annual eye exam at [place] - also saw Dr Alanda Slim last year  Dietary issues and exercise activities discussed: Current Exercise Habits: Home exercise routine, Type of exercise: strength training/weights;walking, Time (Minutes): 30, Frequency (Times/Week): 7, Weekly Exercise (Minutes/Week): 210, Intensity: Mild, Exercise limited by: None identified   Goals Addressed             This Visit's Progress    Increase physical activity   On track    Plan meals   On track    Once per week plan menu for the rest of week - include mostly healthy food options.         Depression Screen PHQ 2/9 Scores 06/09/2021 01/22/2021 08/18/2020 07/22/2020 06/06/2020 01/23/2020 10/24/2019  PHQ - 2 Score 0 0 0 0 0 0 0  PHQ- 9 Score - - - - - - -    Fall Risk Fall Risk  06/09/2021 01/22/2021 08/18/2020 07/22/2020 06/06/2020  Falls in the past year? 0 1 1 1  0  Number falls in past yr: 0 0 1 1 -  Injury with Fall? 0 1 1 1  -  Comment - - dizziness and problem with Left eye. Symptoms are improving though - -  Risk for fall due to : History of fall(s) History of fall(s) History of fall(s);Impaired balance/gait History of fall(s) -  Follow up Falls prevention discussed Falls evaluation completed Falls evaluation completed;Falls prevention discussed Falls evaluation completed -    FALL RISK PREVENTION PERTAINING TO THE HOME:  Any stairs in or around the home? No  If so, are there any without handrails? No  Home free of loose throw rugs in walkways, pet beds, electrical cords, etc? Yes  Adequate lighting in your home to reduce risk of falls? Yes   ASSISTIVE DEVICES UTILIZED TO PREVENT FALLS:  Life alert? No  Use of a cane, walker or w/c? No  Grab bars in the bathroom? Yes  Shower chair or bench in shower? Yes  Elevated toilet seat or a  handicapped toilet? Yes   TIMED  UP AND GO:  Was the test performed? No . Telephonic visit  Cognitive Function: Normal cognitive status assessed by direct observation by this Nurse Health Advisor. No abnormalities found.      6CIT Screen 06/06/2020 06/05/2019  What Year? 0 points 0 points  What month? 0 points 0 points  What time? 0 points 0 points  Count back from 20 0 points 0 points  Months in reverse 0 points 0 points  Repeat phrase 0 points 0 points  Total Score 0 0    Immunizations Immunization History  Administered Date(s) Administered   Fluad Quad(high Dose 65+) 10/24/2019, 10/20/2020   Influenza, High Dose Seasonal PF 10/08/2016, 09/21/2017, 10/02/2018   Influenza,inj,Quad PF,6+ Mos 09/17/2013, 10/01/2015   Influenza-Unspecified 09/26/2014   Moderna Sars-Covid-2 Vaccination 02/26/2020, 03/25/2020, 11/14/2020, 04/01/2021   Pneumococcal Conjugate-13 10/01/2015   Pneumococcal Polysaccharide-23 09/21/2017   Tdap 09/03/2016    TDAP status: Up to date  Flu Vaccine status: Up to date  Pneumococcal vaccine status: Up to date  Covid-19 vaccine status: Completed vaccines  Qualifies for Shingles Vaccine? Yes   Zostavax completed No   Shingrix Completed?: No.    Education has been provided regarding the importance of this vaccine. Patient has been advised to call insurance company to determine out of pocket expense if they have not yet received this vaccine. Advised may also receive vaccine at local pharmacy or Health Dept. Verbalized acceptance and understanding.  Screening Tests Health Maintenance  Topic Date Due   COLONOSCOPY (Pts 45-5yrs Insurance coverage will need to be confirmed)  Never done   Zoster Vaccines- Shingrix (1 of 2) Never done   MAMMOGRAM  07/23/2020   INFLUENZA VACCINE  07/27/2021   DEXA SCAN  06/24/2023   TETANUS/TDAP  09/03/2026   COVID-19 Vaccine  Completed   Hepatitis C Screening  Completed   PNA vac Low Risk Adult  Completed   HPV VACCINES   Aged Out    Health Maintenance  Health Maintenance Due  Topic Date Due   COLONOSCOPY (Pts 45-48yrs Insurance coverage will need to be confirmed)  Never done   Zoster Vaccines- Shingrix (1 of 2) Never done   MAMMOGRAM  07/23/2020    Colorectal cancer screening: Type of screening: Cologuard. Completed 07/20/2019. Repeat every 3 years  Mammogram status: Ordered 06/09/21. Pt provided with contact info and advised to call to schedule appt.   Bone Density status: Completed 06/23/2018. Results reflect: Bone density results: NORMAL. Repeat every 5 years.  Lung Cancer Screening: (Low Dose CT Chest recommended if Age 69-80 years, 30 pack-year currently smoking OR have quit w/in 15years.) does not qualify.   Additional Screening:  Hepatitis C Screening: does qualify; Completed 09/03/2016  Vision Screening: Recommended annual ophthalmology exams for early detection of glaucoma and other disorders of the eye. Is the patient up to date with their annual eye exam?  Yes  Who is the provider or what is the name of the office in which the patient attends annual eye exams? [facility] or [facility] If pt is not established with a provider, would they like to be referred to a provider to establish care? No .   Dental Screening: Recommended annual dental exams for proper oral hygiene  Community Resource Referral / Chronic Care Management: CRR required this visit?  No   CCM required this visit?  No      Plan:     I have personally reviewed and noted the following in the patient's chart:   Medical and social  history Use of alcohol, tobacco or illicit drugs  Current medications and supplements including opioid prescriptions.  Functional ability and status Nutritional status Physical activity Advanced directives List of other physicians Hospitalizations, surgeries, and ER visits in previous 12 months Vitals Screenings to include cognitive, depression, and falls Referrals and  appointments  In addition, I have reviewed and discussed with patient certain preventive protocols, quality metrics, and best practice recommendations. A written personalized care plan for preventive services as well as general preventive health recommendations were provided to patient.     Sandrea Hammond, LPN   8/87/1960   Nurse Notes: None

## 2021-06-09 NOTE — Patient Instructions (Signed)
Bethany [name] , Thank you for taking time to come for your Medicare Wellness Visit. I appreciate your ongoing commitment to your health goals. Please review the following plan we discussed and let me know if I can assist you in the future.   Screening recommendations/referrals: Colonoscopy: Cologuard done 07/20/2019 - repeat in 3 years Mammogram: ordered repeat today Bone Density: Done 06/23/2018 - repeat in 5 years Recommended yearly ophthalmology/optometry visit for glaucoma screening and checkup Recommended yearly dental visit for hygiene and checkup  Vaccinations: Influenza vaccine: Done 10/20/2020 - repeat annually Pneumococcal vaccine: Done 10/01/2015 & 09/21/2018\8 Tdap vaccine: Done 09/03/2016 - Repeat in 10 years Shingles vaccine: Due Shingrix discussed. Please contact your pharmacy for coverage information.    Covid-19:Done 02/26/2020, 03/25/2020, 11/14/2020, 04/01/2021  Advanced directives: Please bring a copy of your health care power of attorney and living will to the office to be added to your chart at your convenience.  Conditions/risks identified: Aim for 30 minutes of exercise or brisk walking each day, drink 6-8 glasses of water and eat lots of fruits and vegetables.  Next appointment: Follow up in one year for your annual wellness visit    Preventive Care 65 Years and Older, Female Preventive care refers to lifestyle choices and visits with your health care provider that can promote health and wellness. What does preventive care include? A yearly physical exam. This is also called an annual well check. Dental exams once or twice a year. Routine eye exams. Ask your health care provider how often you should have your eyes checked. Personal lifestyle choices, including: Daily care of your teeth and gums. Regular physical activity. Eating a healthy diet. Avoiding tobacco and drug use. Limiting alcohol use. Practicing safe sex. Taking low-dose aspirin every day. Taking vitamin  and mineral supplements as recommended by your health care provider. What happens during an annual well check? The services and screenings done by your health care provider during your annual well check will depend on your age, overall health, lifestyle risk factors, and family history of disease. Counseling  Your health care provider may ask you questions about your: Alcohol use. Tobacco use. Drug use. Emotional well-being. Home and relationship well-being. Sexual activity. Eating habits. History of falls. Memory and ability to understand (cognition). Work and work Statistician. Reproductive health. Screening  You may have the following tests or measurements: Height, weight, and BMI. Blood pressure. Lipid and cholesterol levels. These may be checked every 5 years, or more frequently if you are over 71 years old. Skin check. Lung cancer screening. You may have this screening every year starting at age 38 if you have a 30-pack-year history of smoking and currently smoke or have quit within the past 15 years. Fecal occult blood test (FOBT) of the stool. You may have this test every year starting at age 22. Flexible sigmoidoscopy or colonoscopy. You may have a sigmoidoscopy every 5 years or a colonoscopy every 10 years starting at age 10. Hepatitis C blood test. Hepatitis B blood test. Sexually transmitted disease (STD) testing. Diabetes screening. This is done by checking your blood sugar (glucose) after you have not eaten for a while (fasting). You may have this done every 1-3 years. Bone density scan. This is done to screen for osteoporosis. You may have this done starting at age 83. Mammogram. This may be done every 1-2 years. Talk to your health care provider about how often you should have regular mammograms. Talk with your health care provider about your test results, treatment  options, and if necessary, the need for more tests. Vaccines  Your health care provider may recommend  certain vaccines, such as: Influenza vaccine. This is recommended every year. Tetanus, diphtheria, and acellular pertussis (Tdap, Td) vaccine. You may need a Td booster every 10 years. Zoster vaccine. You may need this after age 64. Pneumococcal 13-valent conjugate (PCV13) vaccine. One dose is recommended after age 27. Pneumococcal polysaccharide (PPSV23) vaccine. One dose is recommended after age 24. Talk to your health care provider about which screenings and vaccines you need and how often you need them. This information is not intended to replace advice given to you by your health care provider. Make sure you discuss any questions you have with your health care provider. Document Released: 01/09/2016 Document Revised: 09/01/2016 Document Reviewed: 10/14/2015 Elsevier Interactive Patient Education  2017 [employer] Prevention in the Home Falls can cause injuries. They can happen to people of all ages. There are many things you can do to make your home safe and to help prevent falls. What can I do on the outside of my home? Regularly fix the edges of walkways and driveways and fix any cracks. Remove anything that might make you trip as you walk through a door, such as a raised step or threshold. Trim any bushes or trees on the path to your home. Use bright outdoor lighting. Clear any walking paths of anything that might make someone trip, such as rocks or tools. Regularly check to see if handrails are loose or broken. Make sure that both sides of any steps have handrails. Any raised decks and porches should have guardrails on the edges. Have any leaves, snow, or ice cleared regularly. Use sand or salt on walking paths during winter. Clean up any spills in your garage right away. This includes oil or grease spills. What can I do in the bathroom? Use night lights. Install grab bars by the toilet and in the tub and shower. Do not use towel bars as grab bars. Use non-skid mats or  decals in the tub or shower. If you need to sit down in the shower, use a plastic, non-slip stool. Keep the floor dry. Clean up any water that spills on the floor as soon as it happens. Remove soap buildup in the tub or shower regularly. Attach bath mats securely with double-sided non-slip rug tape. Do not have throw rugs and other things on the floor that can make you trip. What can I do in the bedroom? Use night lights. Make sure that you have a light by your bed that is easy to reach. Do not use any sheets or blankets that are too big for your bed. They should not hang down onto the floor. Have a firm chair that has side arms. You can use this for support while you get dressed. Do not have throw rugs and other things on the floor that can make you trip. What can I do in the kitchen? Clean up any spills right away. Avoid walking on wet floors. Keep items that you use a lot in easy-to-reach places. If you need to reach something above you, use a strong step stool that has a grab bar. Keep electrical cords out of the way. Do not use floor polish or wax that makes floors slippery. If you must use wax, use non-skid floor wax. Do not have throw rugs and other things on the floor that can make you trip. What can I do with my stairs? Do not  leave any items on the stairs. Make sure that there are handrails on both sides of the stairs and use them. Fix handrails that are broken or loose. Make sure that handrails are as long as the stairways. Check any carpeting to make sure that it is firmly attached to the stairs. Fix any carpet that is loose or worn. Avoid having throw rugs at the top or bottom of the stairs. If you do have throw rugs, attach them to the floor with carpet tape. Make sure that you have a light switch at the top of the stairs and the bottom of the stairs. If you do not have them, ask someone to add them for you. What else can I do to help prevent falls? Wear shoes that: Do not  have high heels. Have rubber bottoms. Are comfortable and fit you well. Are closed at the toe. Do not wear sandals. If you use a stepladder: Make sure that it is fully opened. Do not climb a closed stepladder. Make sure that both sides of the stepladder are locked into place. Ask someone to hold it for you, if possible. Clearly mark and make sure that you can see: Any grab bars or handrails. First and last steps. Where the edge of each step is. Use tools that help you move around (mobility aids) if they are needed. These include: Canes. Walkers. Scooters. Crutches. Turn on the lights when you go into a dark area. Replace any light bulbs as soon as they burn out. Set up your furniture so you have a clear path. Avoid moving your furniture around. If any of your floors are uneven, fix them. If there are any pets around you, be aware of where they are. Review your medicines with your doctor. Some medicines can make you feel dizzy. This can increase your chance of falling. Ask your doctor what other things that you can do to help prevent falls. This information is not intended to replace advice given to you by your health care provider. Make sure you discuss any questions you have with your health care provider. Document Released: 10/09/2009 Document Revised: 05/20/2016 Document Reviewed: 01/17/2015 Elsevier Interactive Patient Education  2017 [employer].

## 2021-06-15 NOTE — Telephone Encounter (Signed)
Attempted to contact patient to advise of mychart message that was not read in regards to her blood pressures that were sent in.  Patient did not answer, unable to leave a message.

## 2021-06-24 ENCOUNTER — Telehealth: Payer: Self-pay | Admitting: *Deleted

## 2021-06-24 DIAGNOSIS — I10 Essential (primary) hypertension: Secondary | ICD-10-CM

## 2021-06-24 NOTE — Telephone Encounter (Signed)
Returning call from VM I received, pt wanted to talk about a medication she misplaced and the next refill is not till 08/12/21, what can she do about it. LMOVM to call back with what medication this is

## 2021-06-25 NOTE — Telephone Encounter (Signed)
Pt misplaced her Olmesartan, pharmacy was able to get her a 30 day supply

## 2021-07-16 DIAGNOSIS — H532 Diplopia: Secondary | ICD-10-CM | POA: Diagnosis not present

## 2021-07-16 DIAGNOSIS — H2513 Age-related nuclear cataract, bilateral: Secondary | ICD-10-CM | POA: Diagnosis not present

## 2021-07-16 DIAGNOSIS — S0232XG Fracture of orbital floor, left side, subsequent encounter for fracture with delayed healing: Secondary | ICD-10-CM | POA: Diagnosis not present

## 2021-07-20 DIAGNOSIS — M9904 Segmental and somatic dysfunction of sacral region: Secondary | ICD-10-CM | POA: Diagnosis not present

## 2021-07-20 DIAGNOSIS — M62830 Muscle spasm of back: Secondary | ICD-10-CM | POA: Diagnosis not present

## 2021-07-20 DIAGNOSIS — M9901 Segmental and somatic dysfunction of cervical region: Secondary | ICD-10-CM | POA: Diagnosis not present

## 2021-07-20 DIAGNOSIS — M9903 Segmental and somatic dysfunction of lumbar region: Secondary | ICD-10-CM | POA: Diagnosis not present

## 2021-07-21 DIAGNOSIS — M9901 Segmental and somatic dysfunction of cervical region: Secondary | ICD-10-CM | POA: Diagnosis not present

## 2021-07-21 DIAGNOSIS — M9904 Segmental and somatic dysfunction of sacral region: Secondary | ICD-10-CM | POA: Diagnosis not present

## 2021-07-21 DIAGNOSIS — M62830 Muscle spasm of back: Secondary | ICD-10-CM | POA: Diagnosis not present

## 2021-07-21 DIAGNOSIS — M9903 Segmental and somatic dysfunction of lumbar region: Secondary | ICD-10-CM | POA: Diagnosis not present

## 2021-07-22 ENCOUNTER — Ambulatory Visit: Payer: Medicare Other | Admitting: Family Medicine

## 2021-07-22 DIAGNOSIS — M62830 Muscle spasm of back: Secondary | ICD-10-CM | POA: Diagnosis not present

## 2021-07-22 DIAGNOSIS — M9904 Segmental and somatic dysfunction of sacral region: Secondary | ICD-10-CM | POA: Diagnosis not present

## 2021-07-22 DIAGNOSIS — I10 Essential (primary) hypertension: Secondary | ICD-10-CM

## 2021-07-22 DIAGNOSIS — M9901 Segmental and somatic dysfunction of cervical region: Secondary | ICD-10-CM | POA: Diagnosis not present

## 2021-07-22 DIAGNOSIS — R7303 Prediabetes: Secondary | ICD-10-CM

## 2021-07-22 DIAGNOSIS — E782 Mixed hyperlipidemia: Secondary | ICD-10-CM

## 2021-07-22 DIAGNOSIS — M9903 Segmental and somatic dysfunction of lumbar region: Secondary | ICD-10-CM | POA: Diagnosis not present

## 2021-07-22 DIAGNOSIS — M1710 Unilateral primary osteoarthritis, unspecified knee: Secondary | ICD-10-CM

## 2021-07-23 ENCOUNTER — Encounter: Payer: Self-pay | Admitting: Family Medicine

## 2021-07-28 ENCOUNTER — Other Ambulatory Visit: Payer: Self-pay | Admitting: Family Medicine

## 2021-07-28 DIAGNOSIS — I10 Essential (primary) hypertension: Secondary | ICD-10-CM

## 2021-08-20 ENCOUNTER — Other Ambulatory Visit: Payer: Self-pay | Admitting: Family Medicine

## 2021-08-20 ENCOUNTER — Telehealth: Payer: Self-pay | Admitting: Family Medicine

## 2021-08-20 NOTE — Telephone Encounter (Signed)
Made pt appt with Bethany [name] for tomorrow She is having swelling in her foot and was requesting refill on Amlodipine which was DC'd in 09/2019  Would also like to Have Dr. Livia Snellen refill her Rosuvastatin & ASA, Dr. Marisue Ivan cardiologist has been filling these

## 2021-08-21 ENCOUNTER — Other Ambulatory Visit: Payer: Self-pay

## 2021-08-21 ENCOUNTER — Ambulatory Visit (INDEPENDENT_AMBULATORY_CARE_PROVIDER_SITE_OTHER): Payer: Medicare Other | Admitting: Family Medicine

## 2021-08-21 ENCOUNTER — Encounter: Payer: Self-pay | Admitting: Family Medicine

## 2021-08-21 VITALS — BP 122/75 | HR 71 | Temp 97.7°F | Ht 65.0 in | Wt 199.4 lb

## 2021-08-21 DIAGNOSIS — M79672 Pain in left foot: Secondary | ICD-10-CM | POA: Diagnosis not present

## 2021-08-21 DIAGNOSIS — E782 Mixed hyperlipidemia: Secondary | ICD-10-CM

## 2021-08-21 DIAGNOSIS — J302 Other seasonal allergic rhinitis: Secondary | ICD-10-CM | POA: Diagnosis not present

## 2021-08-21 MED ORDER — CETIRIZINE HCL 5 MG PO TABS: 5.0000 mg | ORAL_TABLET | Freq: Every day | ORAL | 2 refills | Status: DC

## 2021-08-21 MED ORDER — ROSUVASTATIN CALCIUM 20 MG PO TABS: 20.0000 mg | ORAL_TABLET | Freq: Every day | ORAL | 0 refills | Status: DC

## 2021-08-21 MED ORDER — PREDNISONE 20 MG PO TABS: 20.0000 mg | ORAL_TABLET | Freq: Every day | ORAL | 0 refills | Status: AC

## 2021-08-21 NOTE — Progress Notes (Signed)
Acute Office Visit  Subjective:    Patient ID: Bethany [name], female    DOB: July 22, 1951, 70 y.o.   MRN: [id number]  Chief Complaint  Patient presents with   Foot Pain    HPI Patient is in today for left foot pain x 2 weeks. The pain is on the top of her foot and radiates around her ankle and up her shin. The pain is achy and is intermittent. It is a 5/10. The pain is worse with activity and improves with rest. The pain is also worse when her foot swells some. Denies injury but reports that she has been more active for the last few weeks cleaning out her home. She has been elevating her foot with some improvement. She has also been taking voltaren and tylenol arthritis. She denies erythema or paresthesia.   She also reports that her right ear is itching. Denies pain, fever, or drainage. She also reports itchy throat and watery eyes.  She needs a refill on her statin medication today. She needs to schedule a chronic follow up appointment with her PCP.   Past Medical History:  Diagnosis Date   Allergy 1997   knees   Arthritis    Diabetes mellitus without complication (HCC)    GERD (gastroesophageal reflux disease)    Hyperlipidemia    Hypertension    Obesity    Pneumonia    1 month ago - rsolved   Seasonal allergies    Sleep apnea    cpap    Past Surgical History:  Procedure Laterality Date   ABDOMINAL HYSTERECTOMY     BREAST SURGERY     fibroid cysts removed   GANGLION CYST EXCISION     left   phlebectomy     TOTAL KNEE ARTHROPLASTY Right 10/22/2013   Procedure: RIGHT TOTAL KNEE ARTHROPLASTY;  Surgeon: Gearlean Alf, MD;  Location: [facility];  Service: Orthopedics;  Laterality: Right;   TOTAL KNEE ARTHROPLASTY Left 06/11/2015   Procedure: LEFT TOTAL KNEE ARTHROPLASTY;  Surgeon: Gaynelle Arabian, MD;  Location: [facility];  Service: Orthopedics;  Laterality: Left;   TUBAL LIGATION      Family History  Problem Relation Age of Onset   Heart disease Mother    Early death  Father        Shooting   Diabetes Sister    Arthritis Brother        knees   Heart disease Sister        tumor in the heart    Social History   Socioeconomic History   Marital status: Married    Spouse name: Not on file   Number of children: 4   Years of education: 12   Highest education level: High school graduate  Occupational History   Occupation: retired    Fish farm manager: [employer]  Tobacco Use   Smoking status: Never   Smokeless tobacco: Never  Vaping Use   Vaping Use: Never used  Substance and Sexual Activity   Alcohol use: No   Drug use: No   Sexual activity: Not Currently    Comment: husband not able to have  sex  Other Topics Concern   Not on file  Social History Narrative   Lives home with her husband. Had 4 children, one passed away. Children live nearby, daughter lives next street over   Social Determinants of Health   Financial Resource Strain: Low Risk    Difficulty of Paying Living Expenses: Not hard at all  Food Insecurity:  No Food Insecurity   Worried About Charity fundraiser in the Last Year: Never true   Ran Out of Food in the Last Year: Never true  Transportation Needs: No Transportation Needs   Lack of Transportation (Medical): No   Lack of Transportation (Non-Medical): No  Physical Activity: Sufficiently Active   Days of Exercise per Week: 7 days   Minutes of Exercise per Session: 30 min  Stress: No Stress Concern Present   Feeling of Stress : Only a little  Social Connections: Engineer, building services of Communication with Friends and Family: More than three times a week   Frequency of Social Gatherings with Friends and Family: More than three times a week   Attends Religious Services: More than 4 times per year   Active Member of [employer] or Organizations: Yes   Attends Music therapist: More than 4 times per year   Marital Status: Married  Human resources officer Violence: Not At Risk   Fear of Current or Ex-Partner: No    Emotionally Abused: No   Physically Abused: No   Sexually Abused: No    Outpatient Medications Prior to Visit  Medication Sig Dispense Refill   acyclovir (ZOVIRAX) 200 MG capsule TAKE 1 CAPSULE EVERY DAY AS NEEDED FOR  FLAREUPS 90 capsule 0   aspirin EC 81 MG tablet Take 1 tablet (81 mg total) by mouth daily. Swallow whole. 90 tablet 3   clotrimazole (MYCELEX) 10 MG troche Take 1 tablet (10 mg total) by mouth 5 (five) times daily. 30 Troche 0   diclofenac (VOLTAREN) 75 MG EC tablet TAKE 1 TABLET TWICE DAILY FOR MUSCLE AND JOINT PAIN 180 tablet 0   fluticasone (FLONASE) 50 MCG/ACT nasal spray Place 2 sprays into both nostrils as needed. 48 g 1   furosemide (LASIX) 20 MG tablet TAKE 1 TABLET (20 MG TOTAL) BY MOUTH EVERY MORNING. 90 tablet 0   meclizine (ANTIVERT) 25 MG tablet TAKE 1 TABLET THREE TIMES DAILY AS NEEDED  FOR  DIZZINESS 270 tablet 1   metoprolol tartrate (LOPRESSOR) 100 MG tablet Take 1 tablet by mouth once for procedure. 1 tablet 0   olmesartan (BENICAR) 20 MG tablet TAKE 1 TABLET EVERY DAY 90 tablet 1   rosuvastatin (CRESTOR) 20 MG tablet Take 1 tablet (20 mg total) by mouth daily. 90 tablet 3   VENTOLIN HFA 108 (90 Base) MCG/ACT inhaler INHALE 2 PUFFS BY MOUTH EVERY 6 HOURS AS NEEDED FOR WHEEZING OR SHORTNESS OF BREATH 18 g 3   No facility-administered medications prior to visit.    Allergies  Allergen Reactions   Hctz [Hydrochlorothiazide]     Hair loss    Review of Systems As per HPI.     Objective:    Physical Exam Vitals and nursing note reviewed.  Constitutional:      General: She is not in acute distress.    Appearance: Normal appearance. She is not ill-appearing, toxic-appearing or diaphoretic.  HENT:     Right Ear: Tympanic membrane, ear canal and external ear normal.  Eyes:     General:        Right eye: No discharge.        Left eye: No discharge.     Extraocular Movements: Extraocular movements intact.     Conjunctiva/sclera: Conjunctivae normal.   Musculoskeletal:     Right ankle: Normal.     Right Achilles Tendon: Normal.     Right foot: Normal range of motion and normal capillary refill.  No swelling, deformity, tenderness, bony tenderness or crepitus. Normal pulse.  Skin:    General: Skin is warm and dry.  Neurological:     General: No focal deficit present.     Mental Status: She is alert and oriented to person, place, and time.  Psychiatric:        Mood and Affect: Mood normal.        Behavior: Behavior normal.    There were no vitals taken for this visit. Wt Readings from Last 3 Encounters:  06/09/21 204 lb (92.5 kg)  04/27/21 204 lb 3.2 oz (92.6 kg)  04/17/21 200 lb 2 oz (90.8 kg)    Health Maintenance Due  Topic Date Due   MAMMOGRAM  07/23/2020    There are no preventive care reminders to display for this patient.   Lab Results  Component Value Date   TSH 2.830 01/22/2021   Lab Results  Component Value Date   WBC 8.6 01/22/2021   HGB 13.2 01/22/2021   HCT 39.6 01/22/2021   MCV 87 01/22/2021   PLT 273 01/22/2021   Lab Results  Component Value Date   NA 145 (H) 01/22/2021   K 4.1 01/22/2021   CO2 24 01/22/2021   GLUCOSE 94 01/22/2021   BUN 11 01/22/2021   CREATININE 0.95 01/22/2021   BILITOT 0.8 01/22/2021   ALKPHOS 79 01/22/2021   AST 17 01/22/2021   ALT 10 01/22/2021   PROT 7.5 01/22/2021   ALBUMIN 4.3 01/22/2021   CALCIUM 10.2 01/22/2021   ANIONGAP 6 06/13/2015   Lab Results  Component Value Date   CHOL 180 01/22/2021   Lab Results  Component Value Date   HDL 53 01/22/2021   Lab Results  Component Value Date   LDLCALC 112 (H) 01/22/2021   Lab Results  Component Value Date   TRIG 78 01/22/2021   Lab Results  Component Value Date   CHOLHDL 3.4 01/22/2021   Lab Results  Component Value Date   HGBA1C 5.6 01/22/2021       Assessment & Plan:   Lailaa was seen today for foot pain.  Diagnoses and all orders for this visit:  Acute foot pain, left Normal exam today.  Prednisone burst as below. Continue voltaren and tylenol. Discussed RICE therapy.  -     predniSONE (DELTASONE) 20 MG tablet; Take 1 tablet (20 mg total) by mouth daily with breakfast for 3 days.  Seasonal allergies Try zyrtec as below.  -     cetirizine (ZYRTEC) 5 MG tablet; Take 1 tablet (5 mg total) by mouth daily.  Mixed hyperlipidemia Refill provided. Last LDL was 112. Patient is due for chronic follow up with PCP, reminded to schedule appointment for this.  -     rosuvastatin (CRESTOR) 20 MG tablet; Take 1 tablet (20 mg total) by mouth daily.  Return to office for new or worsening symptoms, or if symptoms persist.   The patient indicates understanding of these issues and agrees with the plan.  Gwenlyn Perking, FNP

## 2021-08-21 NOTE — Patient Instructions (Signed)

## 2021-09-01 ENCOUNTER — Other Ambulatory Visit: Payer: Self-pay | Admitting: Family Medicine

## 2021-09-01 DIAGNOSIS — Z1231 Encounter for screening mammogram for malignant neoplasm of breast: Secondary | ICD-10-CM

## 2021-09-15 ENCOUNTER — Ambulatory Visit (INDEPENDENT_AMBULATORY_CARE_PROVIDER_SITE_OTHER): Payer: Medicare Other | Admitting: Family Medicine

## 2021-09-15 ENCOUNTER — Encounter: Payer: Self-pay | Admitting: Family Medicine

## 2021-09-15 ENCOUNTER — Other Ambulatory Visit: Payer: Self-pay

## 2021-09-15 VITALS — BP 133/83 | HR 69 | Temp 97.6°F | Ht 65.0 in | Wt 199.0 lb

## 2021-09-15 DIAGNOSIS — E782 Mixed hyperlipidemia: Secondary | ICD-10-CM | POA: Diagnosis not present

## 2021-09-15 DIAGNOSIS — I209 Angina pectoris, unspecified: Secondary | ICD-10-CM | POA: Diagnosis not present

## 2021-09-15 DIAGNOSIS — Z23 Encounter for immunization: Secondary | ICD-10-CM | POA: Diagnosis not present

## 2021-09-15 DIAGNOSIS — I10 Essential (primary) hypertension: Secondary | ICD-10-CM

## 2021-09-15 DIAGNOSIS — L659 Nonscarring hair loss, unspecified: Secondary | ICD-10-CM | POA: Diagnosis not present

## 2021-09-15 DIAGNOSIS — M79641 Pain in right hand: Secondary | ICD-10-CM

## 2021-09-15 DIAGNOSIS — R7303 Prediabetes: Secondary | ICD-10-CM | POA: Diagnosis not present

## 2021-09-15 LAB — BAYER DCA HB A1C WAIVED: HB A1C (BAYER DCA - WAIVED): 5.6 % (ref 4.8–5.6)

## 2021-09-15 MED ORDER — ROSUVASTATIN CALCIUM 20 MG PO TABS: 20.0000 mg | ORAL_TABLET | Freq: Every day | ORAL | 3 refills | Status: DC

## 2021-09-15 MED ORDER — PREDNISONE 10 MG PO TABS: ORAL_TABLET | ORAL | 0 refills | Status: DC

## 2021-09-15 MED ORDER — OLMESARTAN MEDOXOMIL 20 MG PO TABS: 20.0000 mg | ORAL_TABLET | Freq: Every day | ORAL | 3 refills | Status: DC

## 2021-09-15 NOTE — Progress Notes (Signed)
Subjective:  Patient ID: Bethany [name], female    DOB: 12/17/1951  Age: 70 y.o. MRN: [id number]  CC: Medical Management of Chronic Issues   HPI Bethany [name] presents for  presents for  follow-up of hypertension. Patient . has no history of headache chest pain or shortness of breath or recent cough. Patient also denies symptoms of TIA such as focal numbness or weakness. Patient denies side effects from medication. States taking it regularly.Stays around 130/80 at home.   Prediabetic. Due for A1c. Denies polyuria, polydipsia, nausea. No excessive hunger.    Depression screen [facility] 2/9 09/15/2021 09/15/2021 08/21/2021  Decreased Interest 0 0 0  Down, Depressed, Hopeless 0 0 1  PHQ - 2 Score 0 0 1  Altered sleeping 1 - 1  Tired, decreased energy 1 - 1  Change in appetite 0 - 0  Feeling bad or failure about yourself  0 - 0  Trouble concentrating 0 - 0  Moving slowly or fidgety/restless 0 - 0  Suicidal thoughts 0 - 0  PHQ-9 Score 2 - 3  Difficult doing work/chores Not difficult at all - Somewhat difficult  Some recent data might be hidden    History Bethany [name] has a past medical history of Allergy (1997), Arthritis, Diabetes mellitus without complication ([place]), GERD (gastroesophageal reflux disease), Hyperlipidemia, Hypertension, Obesity, Pneumonia, Seasonal allergies, and Sleep apnea.   She has a past surgical history that includes Abdominal hysterectomy; Tubal ligation; phlebectomy; Total knee arthroplasty (Right, 10/22/2013); Ganglion cyst excision; Breast surgery; and Total knee arthroplasty (Left, 06/11/2015).   Her family history includes Arthritis in her brother; Diabetes in her sister; Early death in her father; Heart disease in her mother and sister.She reports that she has never smoked. She has never used smokeless tobacco. She reports that she does not drink alcohol and does not use drugs.    ROS Review of Systems  Constitutional: Negative.   HENT: Negative.    Eyes:   Negative for visual disturbance.  Respiratory:  Negative for shortness of breath.   Cardiovascular:  Negative for chest pain.  Gastrointestinal:  Negative for abdominal pain.  Musculoskeletal:  Negative for arthralgias.   Objective:  BP 133/83   Pulse 69   Temp 97.6 F (36.4 C)   Ht 5' 5"  (1.651 m)   Wt 199 lb (90.3 kg)   SpO2 97%   BMI 33.12 kg/m   BP Readings from Last 3 Encounters:  09/15/21 133/83  08/21/21 122/75  04/27/21 (!) 164/98    Wt Readings from Last 3 Encounters:  09/15/21 199 lb (90.3 kg)  08/21/21 199 lb 6 oz (90.4 kg)  06/09/21 204 lb (92.5 kg)     Physical Exam Constitutional:      General: She is not in acute distress.    Appearance: She is well-developed.  HENT:     Head: Normocephalic and atraumatic.  Eyes:     Conjunctiva/sclera: Conjunctivae normal.     Pupils: Pupils are equal, round, and reactive to light.  Neck:     Thyroid: No thyromegaly.  Cardiovascular:     Rate and Rhythm: Normal rate and regular rhythm.     Heart sounds: Normal heart sounds. No murmur heard. Pulmonary:     Effort: Pulmonary effort is normal. No respiratory distress.     Breath sounds: Normal breath sounds. No wheezing or rales.  Abdominal:     General: Bowel sounds are normal. There is no distension.     Palpations: Abdomen is soft.  Tenderness: There is no abdominal tenderness.  Musculoskeletal:        General: Tenderness (right 1st MCP and proximally. Worse with resisted flexion.) present. Normal range of motion.     Cervical back: Normal range of motion and neck supple.  Lymphadenopathy:     Cervical: No cervical adenopathy.  Skin:    General: Skin is warm and dry.  Neurological:     Mental Status: She is alert and oriented to person, place, and time.  Psychiatric:        Behavior: Behavior normal.        Thought Content: Thought content normal.        Judgment: Judgment normal.      Assessment & Plan:   Tondra was seen today for medical  management of chronic issues.  Diagnoses and all orders for this visit:  Mixed hyperlipidemia -     Lipid panel -     rosuvastatin (CRESTOR) 20 MG tablet; Take 1 tablet (20 mg total) by mouth daily.  Primary hypertension -     CBC with Differential/Platelet -     CMP14+EGFR -     olmesartan (BENICAR) 20 MG tablet; Take 1 tablet (20 mg total) by mouth daily.  Prediabetes -     Bayer DCA Hb A1c Waived -     CBC with Differential/Platelet  Pain of right hand -     DG Hand Complete Right; Future  Alopecia -     Ambulatory referral to Dermatology  Need for immunization against influenza -     Flu Vaccine QUAD High Dose(Fluad)  Other orders -     predniSONE (DELTASONE) 10 MG tablet; Take 5 daily for 3 days followed by 4,3,2 and 1 for 3 days each.      I have discontinued Bethany [name]'s metoprolol tartrate, clotrimazole, and cetirizine. I have also changed her olmesartan. Additionally, I am having her start on predniSONE. Lastly, I am having her maintain her Ventolin HFA, fluticasone, aspirin EC, furosemide, diclofenac, meclizine, acyclovir, and rosuvastatin.  Allergies as of 09/15/2021       Reactions   Hctz [hydrochlorothiazide]    Hair loss        Medication List        Accurate as of September 15, 2021 10:10 PM. If you have any questions, ask your nurse or doctor.          STOP taking these medications    cetirizine 5 MG tablet Commonly known as: ZYRTEC Stopped by: Claretta Fraise, MD   clotrimazole 10 MG troche Commonly known as: MYCELEX Stopped by: Claretta Fraise, MD   metoprolol tartrate 100 MG tablet Commonly known as: LOPRESSOR Stopped by: Claretta Fraise, MD       TAKE these medications    acyclovir 200 MG capsule Commonly known as: ZOVIRAX TAKE 1 CAPSULE EVERY DAY AS NEEDED FOR  FLAREUPS   aspirin EC 81 MG tablet Take 1 tablet (81 mg total) by mouth daily. Swallow whole.   diclofenac 75 MG EC tablet Commonly known as: VOLTAREN TAKE 1  TABLET TWICE DAILY FOR MUSCLE AND JOINT PAIN   fluticasone 50 MCG/ACT nasal spray Commonly known as: FLONASE Place 2 sprays into both nostrils as needed.   furosemide 20 MG tablet Commonly known as: LASIX TAKE 1 TABLET (20 MG TOTAL) BY MOUTH EVERY MORNING.   meclizine 25 MG tablet Commonly known as: ANTIVERT TAKE 1 TABLET THREE TIMES DAILY AS NEEDED  FOR  DIZZINESS   olmesartan 20 MG tablet  Commonly known as: BENICAR Take 1 tablet (20 mg total) by mouth daily.   predniSONE 10 MG tablet Commonly known as: DELTASONE Take 5 daily for 3 days followed by 4,3,2 and 1 for 3 days each. Started by: Claretta Fraise, MD   rosuvastatin 20 MG tablet Commonly known as: CRESTOR Take 1 tablet (20 mg total) by mouth daily.   Ventolin HFA 108 (90 Base) MCG/ACT inhaler Generic drug: albuterol INHALE 2 PUFFS BY MOUTH EVERY 6 HOURS AS NEEDED FOR WHEEZING OR SHORTNESS OF BREATH         Follow-up: Return in about 3 months (around 12/15/2021).  Claretta Fraise, M.D.

## 2021-09-16 LAB — CBC WITH DIFFERENTIAL/PLATELET
Basophils Absolute: 0.1 10*3/uL (ref 0.0–0.2)
Basos: 1 %
EOS (ABSOLUTE): 0.1 10*3/uL (ref 0.0–0.4)
Eos: 2 %
Hematocrit: 37.5 % (ref 34.0–46.6)
Hemoglobin: 12.6 g/dL (ref 11.1–15.9)
Immature Grans (Abs): 0 10*3/uL (ref 0.0–0.1)
Immature Granulocytes: 0 %
Lymphocytes Absolute: 2.1 10*3/uL (ref 0.7–3.1)
Lymphs: 26 %
MCH: 29.5 pg (ref 26.6–33.0)
MCHC: 33.6 g/dL (ref 31.5–35.7)
MCV: 88 fL (ref 79–97)
Monocytes Absolute: 0.6 10*3/uL (ref 0.1–0.9)
Monocytes: 8 %
Neutrophils Absolute: 5.1 10*3/uL (ref 1.4–7.0)
Neutrophils: 63 %
Platelets: 261 10*3/uL (ref 150–450)
RBC: 4.27 x10E6/uL (ref 3.77–5.28)
RDW: 13.5 % (ref 11.7–15.4)
WBC: 8 10*3/uL (ref 3.4–10.8)

## 2021-09-16 LAB — LIPID PANEL
Chol/HDL Ratio: 2.8 ratio (ref 0.0–4.4)
Cholesterol, Total: 141 mg/dL (ref 100–199)
HDL: 51 mg/dL (ref >39)
LDL Chol Calc (NIH): 74 mg/dL (ref 0–99)
Triglycerides: 82 mg/dL (ref 0–149)
VLDL Cholesterol Cal: 16 mg/dL (ref 5–40)

## 2021-09-16 LAB — CMP14+EGFR
ALT: 14 IU/L (ref 0–32)
AST: 21 IU/L (ref 0–40)
Albumin/Globulin Ratio: 1.8 (ref 1.2–2.2)
Albumin: 4.3 g/dL (ref 3.7–4.7)
Alkaline Phosphatase: 80 IU/L (ref 44–121)
BUN/Creatinine Ratio: 12 (ref 12–28)
BUN: 11 mg/dL (ref 8–27)
Bilirubin Total: 0.8 mg/dL (ref 0.0–1.2)
CO2: 25 mmol/L (ref 20–29)
Calcium: 9.9 mg/dL (ref 8.7–10.3)
Chloride: 106 mmol/L (ref 96–106)
Creatinine, Ser: 0.91 mg/dL (ref 0.57–1.00)
Globulin, Total: 2.4 g/dL (ref 1.5–4.5)
Glucose: 88 mg/dL (ref 65–99)
Potassium: 3.9 mmol/L (ref 3.5–5.2)
Sodium: 143 mmol/L (ref 134–144)
Total Protein: 6.7 g/dL (ref 6.0–8.5)
eGFR: 67 mL/min/{1.73_m2} (ref >59)

## 2021-09-16 NOTE — Progress Notes (Signed)
Hello Noralyn,  Your lab result is normal and/or stable.Some minor variations that are not significant are commonly marked abnormal, but do not represent any medical problem for you.  Best regards, Ophie Burrowes, M.D.

## 2021-11-02 ENCOUNTER — Other Ambulatory Visit: Payer: Self-pay

## 2021-11-02 ENCOUNTER — Ambulatory Visit
Admission: RE | Admit: 2021-11-02 | Discharge: 2021-11-02 | Disposition: A | Discharge status: Home / Self Care | Payer: Medicare Other | Source: Ambulatory Visit | Attending: Family Medicine | Admitting: Family Medicine

## 2021-11-02 DIAGNOSIS — Z1231 Encounter for screening mammogram for malignant neoplasm of breast: Secondary | ICD-10-CM | POA: Diagnosis not present

## 2021-11-02 DIAGNOSIS — Z23 Encounter for immunization: Secondary | ICD-10-CM | POA: Diagnosis not present

## 2021-11-02 DIAGNOSIS — U071 COVID-19: Secondary | ICD-10-CM | POA: Diagnosis not present

## 2021-11-23 DIAGNOSIS — L668 Other cicatricial alopecia: Secondary | ICD-10-CM | POA: Diagnosis not present

## 2021-11-23 DIAGNOSIS — L659 Nonscarring hair loss, unspecified: Secondary | ICD-10-CM | POA: Diagnosis not present

## 2021-11-23 DIAGNOSIS — R5381 Other malaise: Secondary | ICD-10-CM | POA: Diagnosis not present

## 2021-11-23 DIAGNOSIS — D485 Neoplasm of uncertain behavior of skin: Secondary | ICD-10-CM | POA: Diagnosis not present

## 2021-12-03 IMAGING — MG MM DIGITAL SCREENING BILAT W/ TOMO AND CAD
8 series · 8 of 24 positions shown · non-contrast
Comparison: Previous exam(s).

CLINICAL DATA: Screening.

EXAM:
DIGITAL SCREENING BILATERAL MAMMOGRAM WITH TOMOSYNTHESIS AND CAD
TECHNIQUE: Bilateral screening digital craniocaudal and mediolateral oblique
mammograms were obtained. Bilateral screening digital breast
tomosynthesis was performed. The images were evaluated with
computer-aided detection.

[L MLO synth-2D]
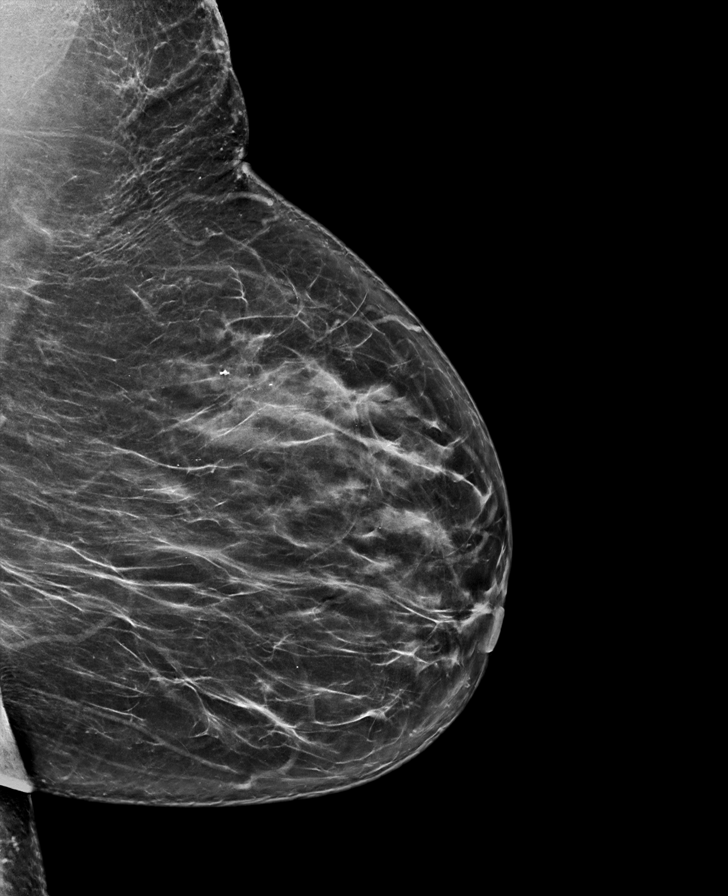

[L CC synth-2D]
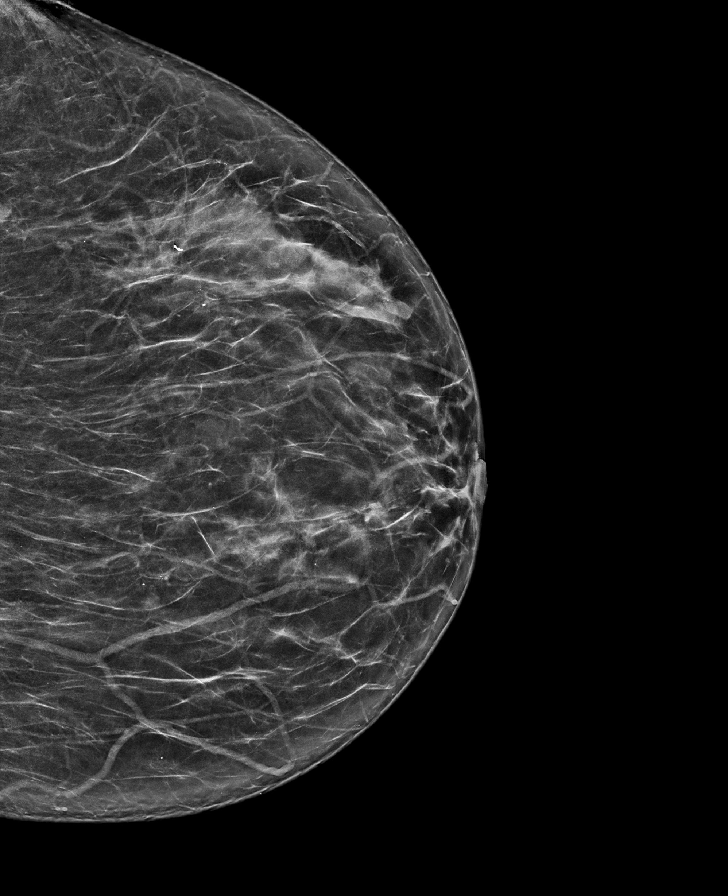

[R MLO synth-2D]
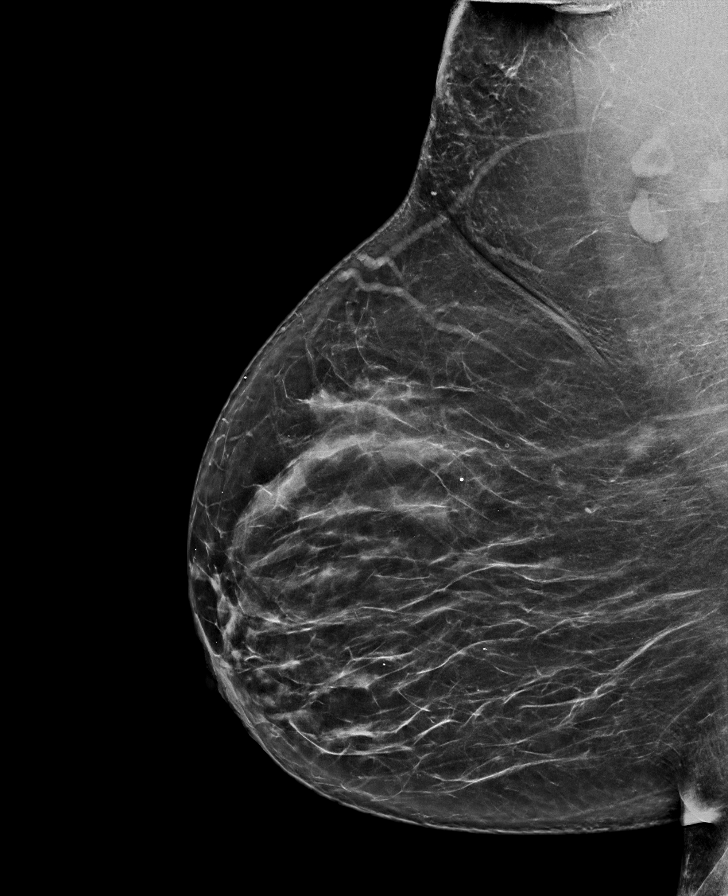

[R CC synth-2D]
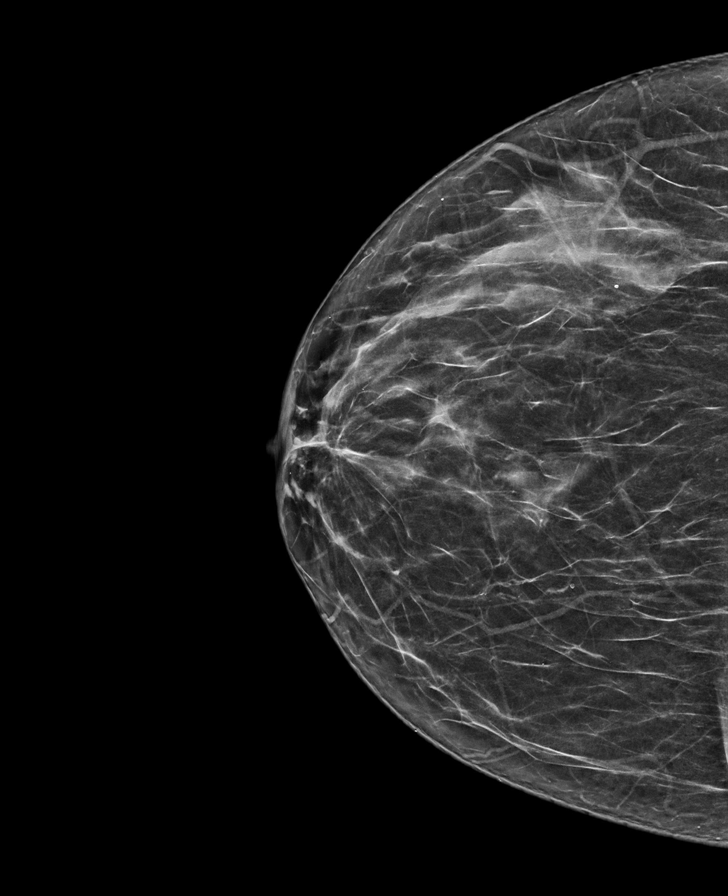

[L MLO tomo · tomo slice 41/82.0]
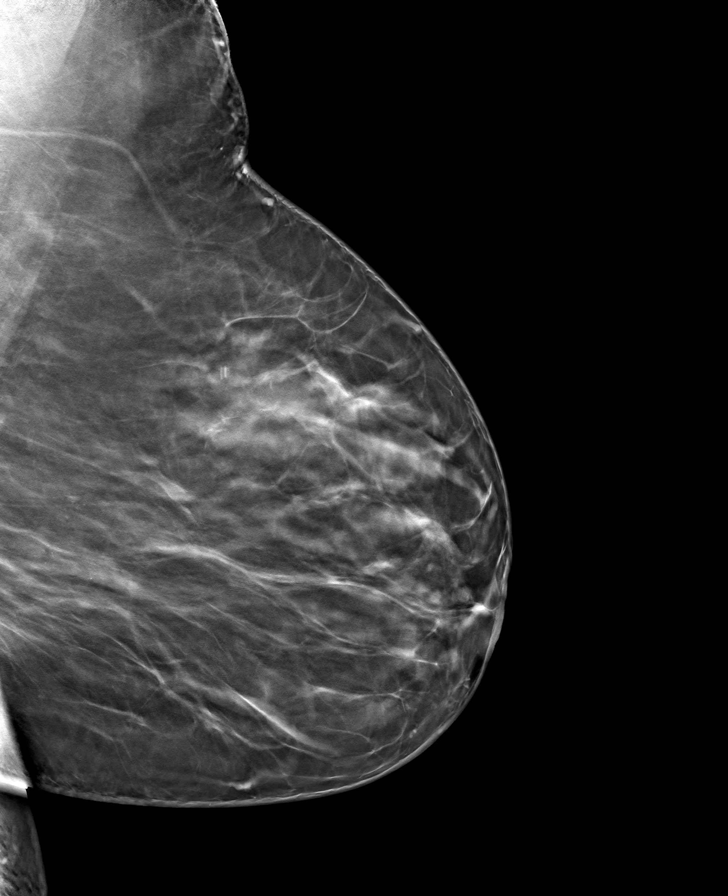

[L CC tomo · tomo slice 35/70.0]
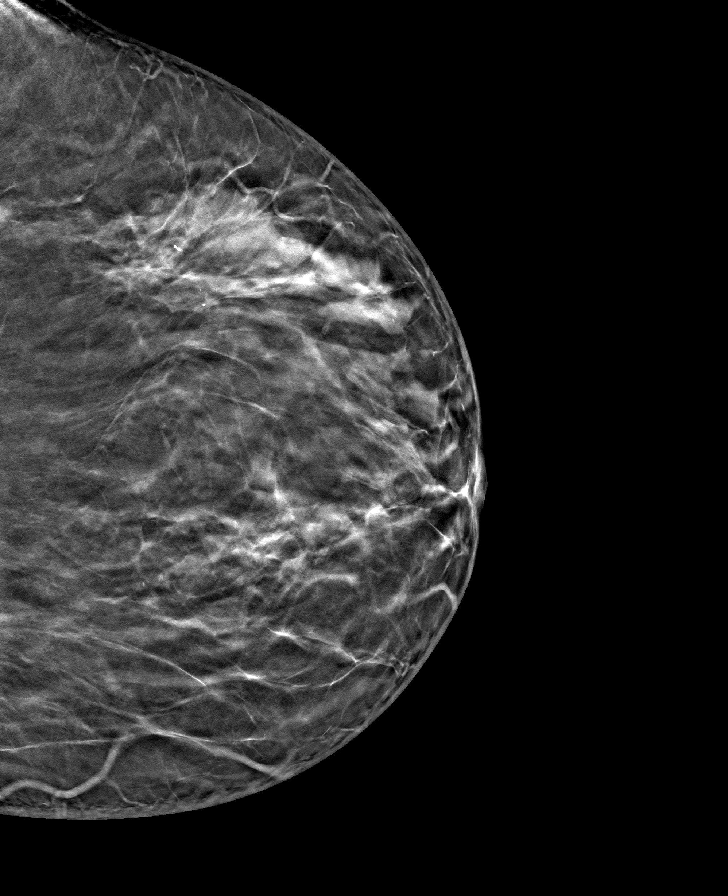

[R CC tomo · tomo slice 33/66.0]
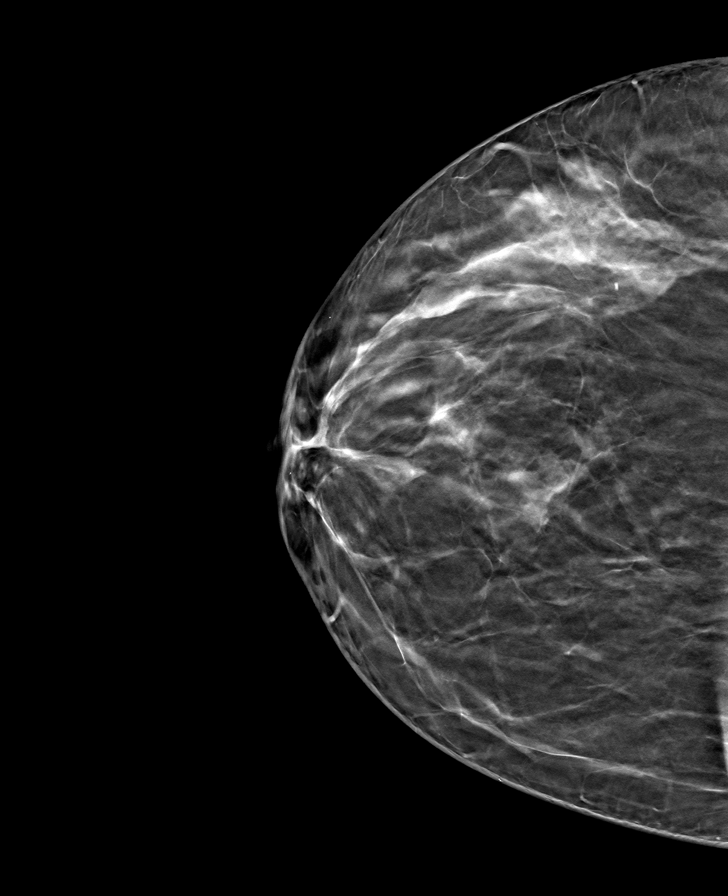

[R MLO tomo · tomo slice 43/84.0]
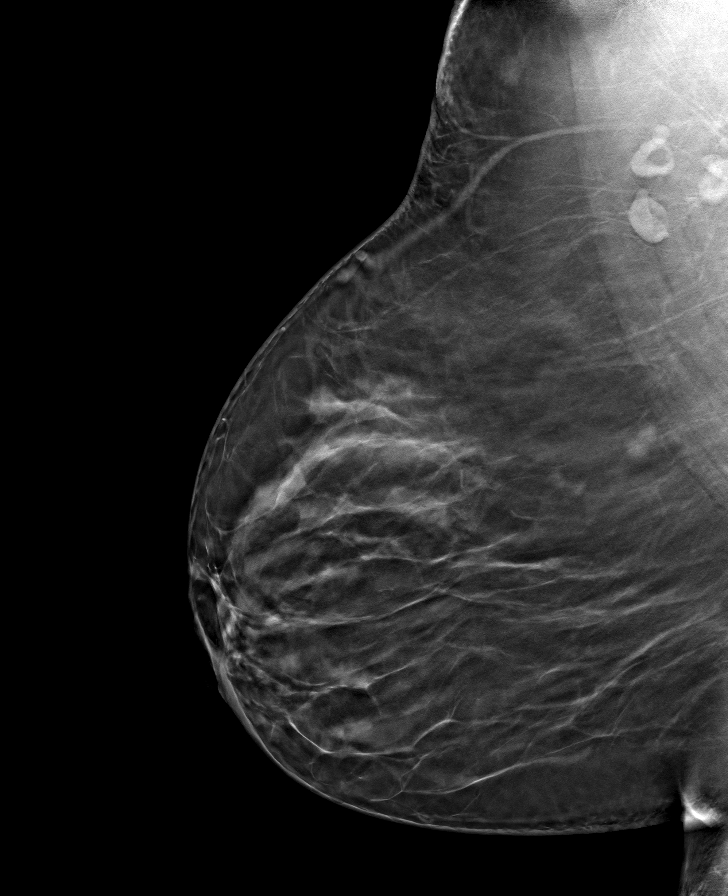

[8 of 24 positions shown; findings below may reference images not displayed]

ACR Breast Density Category b: There are scattered areas of
fibroglandular density.
FINDINGS: There are no findings suspicious for malignancy.
IMPRESSION: No mammographic evidence of malignancy. A result letter of this
screening mammogram will be mailed directly to the patient.

RECOMMENDATION:
Screening mammogram in one year. (Code:51-O-LD2)

BI-RADS CATEGORY  1: Negative.

## 2021-12-08 DIAGNOSIS — L661 Lichen planopilaris: Secondary | ICD-10-CM | POA: Diagnosis not present

## 2021-12-31 DIAGNOSIS — M9904 Segmental and somatic dysfunction of sacral region: Secondary | ICD-10-CM | POA: Diagnosis not present

## 2021-12-31 DIAGNOSIS — M9903 Segmental and somatic dysfunction of lumbar region: Secondary | ICD-10-CM | POA: Diagnosis not present

## 2021-12-31 DIAGNOSIS — M9901 Segmental and somatic dysfunction of cervical region: Secondary | ICD-10-CM | POA: Diagnosis not present

## 2021-12-31 DIAGNOSIS — M62830 Muscle spasm of back: Secondary | ICD-10-CM | POA: Diagnosis not present

## 2022-01-04 DIAGNOSIS — M9901 Segmental and somatic dysfunction of cervical region: Secondary | ICD-10-CM | POA: Diagnosis not present

## 2022-01-04 DIAGNOSIS — M62830 Muscle spasm of back: Secondary | ICD-10-CM | POA: Diagnosis not present

## 2022-01-04 DIAGNOSIS — M9904 Segmental and somatic dysfunction of sacral region: Secondary | ICD-10-CM | POA: Diagnosis not present

## 2022-01-04 DIAGNOSIS — M9903 Segmental and somatic dysfunction of lumbar region: Secondary | ICD-10-CM | POA: Diagnosis not present

## 2022-01-07 DIAGNOSIS — M9903 Segmental and somatic dysfunction of lumbar region: Secondary | ICD-10-CM | POA: Diagnosis not present

## 2022-01-07 DIAGNOSIS — M62830 Muscle spasm of back: Secondary | ICD-10-CM | POA: Diagnosis not present

## 2022-01-07 DIAGNOSIS — M9901 Segmental and somatic dysfunction of cervical region: Secondary | ICD-10-CM | POA: Diagnosis not present

## 2022-01-07 DIAGNOSIS — M9904 Segmental and somatic dysfunction of sacral region: Secondary | ICD-10-CM | POA: Diagnosis not present

## 2022-01-09 ENCOUNTER — Other Ambulatory Visit: Payer: Self-pay | Admitting: Family Medicine

## 2022-01-09 DIAGNOSIS — E782 Mixed hyperlipidemia: Secondary | ICD-10-CM

## 2022-01-09 DIAGNOSIS — B372 Candidiasis of skin and nail: Secondary | ICD-10-CM

## 2022-01-09 DIAGNOSIS — M179 Osteoarthritis of knee, unspecified: Secondary | ICD-10-CM

## 2022-01-11 ENCOUNTER — Other Ambulatory Visit: Payer: Self-pay

## 2022-01-11 MED ORDER — ASPIRIN EC 81 MG PO TBEC: 81.0000 mg | DELAYED_RELEASE_TABLET | Freq: Every day | ORAL | 1 refills | Status: DC

## 2022-01-12 DIAGNOSIS — M9904 Segmental and somatic dysfunction of sacral region: Secondary | ICD-10-CM | POA: Diagnosis not present

## 2022-01-12 DIAGNOSIS — M9903 Segmental and somatic dysfunction of lumbar region: Secondary | ICD-10-CM | POA: Diagnosis not present

## 2022-01-12 DIAGNOSIS — M9901 Segmental and somatic dysfunction of cervical region: Secondary | ICD-10-CM | POA: Diagnosis not present

## 2022-01-12 DIAGNOSIS — M62830 Muscle spasm of back: Secondary | ICD-10-CM | POA: Diagnosis not present

## 2022-02-24 ENCOUNTER — Other Ambulatory Visit: Payer: Self-pay | Admitting: Family Medicine

## 2022-02-24 DIAGNOSIS — B372 Candidiasis of skin and nail: Secondary | ICD-10-CM

## 2022-02-24 MED ORDER — CLOTRIMAZOLE-BETAMETHASONE 1-0.05 % EX CREA: 1.0000 "application " | TOPICAL_CREAM | Freq: Two times a day (BID) | CUTANEOUS | 0 refills | Status: DC

## 2022-02-24 NOTE — Telephone Encounter (Signed)
?  Prescription Request ? ?02/24/2022 ? ?Is this a "Controlled Substance" medicine? no ? ?Have you seen your PCP in the last 2 weeks? Pt has appt 03/21 ? ?If YES, route message to pool  -  If NO, patient needs to be scheduled for appointment. ? ?What is the name of the medication or equipment? Pt needs fluocinaolone acetondie topical oil for her uchenplanopilaris ? ?Have you contacted your pharmacy to request a refill? no  ? ?Which pharmacy would you like this sent to? [facility] ? ? ?Patient notified that their request is being sent to the clinical staff for review and that they should receive a response within 2 business days.  ?  ?

## 2022-02-25 NOTE — Telephone Encounter (Signed)
Aware refill sent to pharmacy ?

## 2022-03-08 DIAGNOSIS — M9901 Segmental and somatic dysfunction of cervical region: Secondary | ICD-10-CM | POA: Diagnosis not present

## 2022-03-08 DIAGNOSIS — M9904 Segmental and somatic dysfunction of sacral region: Secondary | ICD-10-CM | POA: Diagnosis not present

## 2022-03-08 DIAGNOSIS — M62830 Muscle spasm of back: Secondary | ICD-10-CM | POA: Diagnosis not present

## 2022-03-08 DIAGNOSIS — M9903 Segmental and somatic dysfunction of lumbar region: Secondary | ICD-10-CM | POA: Diagnosis not present

## 2022-03-09 DIAGNOSIS — M9903 Segmental and somatic dysfunction of lumbar region: Secondary | ICD-10-CM | POA: Diagnosis not present

## 2022-03-09 DIAGNOSIS — M62830 Muscle spasm of back: Secondary | ICD-10-CM | POA: Diagnosis not present

## 2022-03-09 DIAGNOSIS — M9904 Segmental and somatic dysfunction of sacral region: Secondary | ICD-10-CM | POA: Diagnosis not present

## 2022-03-09 DIAGNOSIS — M9901 Segmental and somatic dysfunction of cervical region: Secondary | ICD-10-CM | POA: Diagnosis not present

## 2022-03-11 DIAGNOSIS — M9904 Segmental and somatic dysfunction of sacral region: Secondary | ICD-10-CM | POA: Diagnosis not present

## 2022-03-11 DIAGNOSIS — M9903 Segmental and somatic dysfunction of lumbar region: Secondary | ICD-10-CM | POA: Diagnosis not present

## 2022-03-11 DIAGNOSIS — M9901 Segmental and somatic dysfunction of cervical region: Secondary | ICD-10-CM | POA: Diagnosis not present

## 2022-03-11 DIAGNOSIS — M62830 Muscle spasm of back: Secondary | ICD-10-CM | POA: Diagnosis not present

## 2022-03-16 ENCOUNTER — Ambulatory Visit (INDEPENDENT_AMBULATORY_CARE_PROVIDER_SITE_OTHER): Payer: Medicare Other | Admitting: Family Medicine

## 2022-03-16 ENCOUNTER — Encounter: Payer: Self-pay | Admitting: Family Medicine

## 2022-03-16 VITALS — BP 176/97 | HR 62 | Temp 97.3°F | Ht 65.0 in | Wt 204.4 lb

## 2022-03-16 DIAGNOSIS — M179 Osteoarthritis of knee, unspecified: Secondary | ICD-10-CM | POA: Diagnosis not present

## 2022-03-16 DIAGNOSIS — E782 Mixed hyperlipidemia: Secondary | ICD-10-CM | POA: Diagnosis not present

## 2022-03-16 DIAGNOSIS — M9901 Segmental and somatic dysfunction of cervical region: Secondary | ICD-10-CM | POA: Diagnosis not present

## 2022-03-16 DIAGNOSIS — R7303 Prediabetes: Secondary | ICD-10-CM

## 2022-03-16 DIAGNOSIS — M9903 Segmental and somatic dysfunction of lumbar region: Secondary | ICD-10-CM | POA: Diagnosis not present

## 2022-03-16 DIAGNOSIS — M62830 Muscle spasm of back: Secondary | ICD-10-CM | POA: Diagnosis not present

## 2022-03-16 DIAGNOSIS — Z23 Encounter for immunization: Secondary | ICD-10-CM

## 2022-03-16 DIAGNOSIS — I10 Essential (primary) hypertension: Secondary | ICD-10-CM | POA: Diagnosis not present

## 2022-03-16 DIAGNOSIS — M9904 Segmental and somatic dysfunction of sacral region: Secondary | ICD-10-CM | POA: Diagnosis not present

## 2022-03-16 LAB — BAYER DCA HB A1C WAIVED: HB A1C (BAYER DCA - WAIVED): 5.4 % (ref 4.8–5.6)

## 2022-03-16 MED ORDER — FUROSEMIDE 20 MG PO TABS: 20.0000 mg | ORAL_TABLET | ORAL | 3 refills | Status: DC

## 2022-03-16 MED ORDER — OLMESARTAN MEDOXOMIL 40 MG PO TABS: 40.0000 mg | ORAL_TABLET | Freq: Every day | ORAL | 3 refills | Status: DC

## 2022-03-16 MED ORDER — DICLOFENAC SODIUM 75 MG PO TBEC: DELAYED_RELEASE_TABLET | ORAL | 3 refills | Status: DC

## 2022-03-16 NOTE — Addendum Note (Signed)
Addended by: Baldomero Lamy B on: 03/16/2022 11:54 AM ? ? Modules accepted: Orders ? ?

## 2022-03-16 NOTE — Progress Notes (Signed)
? ?Subjective:  ?Patient ID: Bethany [name], female    DOB: [birth year]  Age: 71 y.o. MRN: [id number] ? ?CC: Medical Management of Chronic Issues ? ? ?HPI ?Bethany [name] presents for  follow-up of hypertension. Patient has no history of headache chest pain or shortness of breath or recent cough. Patient also denies symptoms of TIA such as focal numbness or weakness. Patient denies side effects from medication. States taking it regularly. ? ?Prediabetes. Not checking glucose. No meds, no SX.  ? ? in for follow-up of elevated cholesterol. Doing well without complaints on current medication. Denies side effects of statin including myalgia and arthralgia and nausea. Currently no chest pain, shortness of breath or other cardiovascular related symptoms noted. ? ? ?Requesting shingles vaccine today. ? ?History ?Bethany [name] has a past medical history of Allergy (1997), Arthritis, Diabetes mellitus without complication ([place]), GERD (gastroesophageal reflux disease), Hyperlipidemia, Hypertension, Obesity, Pneumonia, Seasonal allergies, and Sleep apnea.  ? ?She has a past surgical history that includes Abdominal hysterectomy; Tubal ligation; phlebectomy; Total knee arthroplasty (Right, 10/22/2013); Ganglion cyst excision; Breast surgery; and Total knee arthroplasty (Left, 06/11/2015).  ? ?Her family history includes Arthritis in her brother; Diabetes in her sister; Early death in her father; Heart disease in her mother and sister.She reports that she has never smoked. She has never used smokeless tobacco. She reports that she does not drink alcohol and does not use drugs. ? ?Current Outpatient Medications on File Prior to Visit  ?Medication Sig Dispense Refill  ? acyclovir (ZOVIRAX) 200 MG capsule TAKE 1 CAPSULE EVERY DAY AS NEEDED FOR  FLAREUPS 90 capsule 0  ? aspirin EC 81 MG tablet Take 1 tablet (81 mg total) by mouth daily. Swallow whole. 90 tablet 1  ? clotrimazole-betamethasone (LOTRISONE) cream Apply 1 application topically 2  (two) times daily. 60 g 0  ? fluticasone (FLONASE) 50 MCG/ACT nasal spray Place 2 sprays into both nostrils as needed. 48 g 1  ? meclizine (ANTIVERT) 25 MG tablet TAKE 1 TABLET THREE TIMES DAILY AS NEEDED  FOR  DIZZINESS 270 tablet 1  ? rosuvastatin (CRESTOR) 20 MG tablet TAKE 1 TABLET EVERY DAY 90 tablet 3  ? VENTOLIN HFA 108 (90 Base) MCG/ACT inhaler INHALE 2 PUFFS BY MOUTH EVERY 6 HOURS AS NEEDED FOR WHEEZING OR SHORTNESS OF BREATH 18 g 3  ? ?No current facility-administered medications on file prior to visit.  ? ? ?ROS ?Review of Systems  ?Constitutional: Negative.   ?HENT: Negative.    ?Eyes:  Negative for visual disturbance.  ?Respiratory:  Negative for shortness of breath.   ?Cardiovascular:  Negative for chest pain.  ?Gastrointestinal:  Negative for abdominal pain.  ?Musculoskeletal:  Negative for arthralgias.  ? ?Objective:  ?BP (!) 176/97   Pulse 62   Temp (!) 97.3 ?F (36.3 ?C)   Ht 5' 5"  (1.651 m)   Wt 204 lb 6.4 oz (92.7 kg)   SpO2 97%   BMI 34.01 kg/m?  ? ?BP Readings from Last 3 Encounters:  ?03/16/22 (!) 176/97  ?09/15/21 133/83  ?08/21/21 122/75  ? ? ?Wt Readings from Last 3 Encounters:  ?03/16/22 204 lb 6.4 oz (92.7 kg)  ?09/15/21 199 lb (90.3 kg)  ?08/21/21 199 lb 6 oz (90.4 kg)  ? ? ? ?Physical Exam ?Constitutional:   ?   General: She is not in acute distress. ?   Appearance: She is well-developed.  ?HENT:  ?   Head: Normocephalic and atraumatic.  ?Eyes:  ?   Conjunctiva/sclera: Conjunctivae normal.  ?  Pupils: Pupils are equal, round, and reactive to light.  ?Neck:  ?   Thyroid: No thyromegaly.  ?Cardiovascular:  ?   Rate and Rhythm: Normal rate and regular rhythm.  ?   Heart sounds: Normal heart sounds. No murmur heard. ?Pulmonary:  ?   Effort: Pulmonary effort is normal. No respiratory distress.  ?   Breath sounds: Normal breath sounds. No wheezing or rales.  ?Abdominal:  ?   General: Bowel sounds are normal. There is no distension.  ?   Palpations: Abdomen is soft.  ?   Tenderness:  There is no abdominal tenderness.  ?Musculoskeletal:     ?   General: Normal range of motion.  ?   Cervical back: Normal range of motion and neck supple.  ?Lymphadenopathy:  ?   Cervical: No cervical adenopathy.  ?Skin: ?   General: Skin is warm and dry.  ?Neurological:  ?   Mental Status: She is alert and oriented to person, place, and time.  ?Psychiatric:     ?   Behavior: Behavior normal.     ?   Thought Content: Thought content normal.     ?   Judgment: Judgment normal.  ? ? ? ? ?Assessment & Plan:  ? ?Bethany [name] was seen today for medical management of chronic issues. ? ?Diagnoses and all orders for this visit: ? ?Mixed hyperlipidemia ?-     Lipid panel ? ?Primary hypertension ?-     CBC with Differential/Platelet ?-     CMP14+EGFR ?-     olmesartan (BENICAR) 40 MG tablet; Take 1 tablet (40 mg total) by mouth daily. ? ?Prediabetes ?-     Bayer DCA Hb A1c Waived ?-     Microalbumin / creatinine urine ratio ? ?Osteoarthritis of knee, unspecified laterality, unspecified osteoarthritis type ?-     diclofenac (VOLTAREN) 75 MG EC tablet; TAKE 1 TABLET TWICE DAILY FOR MUSCLE AND JOINT PAIN ? ?Other orders ?-     furosemide (LASIX) 20 MG tablet; Take 1 tablet (20 mg total) by mouth every morning. ? ? ?Allergies as of 03/16/2022   ? ?   Reactions  ? Hctz [hydrochlorothiazide]   ? Hair loss  ? ?  ? ?  ?Medication List  ?  ? ?  ? Accurate as of March 16, 2022 10:46 AM. If you have any questions, ask your nurse or doctor.  ?  ?  ? ?  ? ?STOP taking these medications   ? ?predniSONE 10 MG tablet ?Commonly known as: DELTASONE ?Stopped by: Claretta Fraise, MD ?  ? ?  ? ?TAKE these medications   ? ?acyclovir 200 MG capsule ?Commonly known as: ZOVIRAX ?TAKE 1 CAPSULE EVERY DAY AS NEEDED FOR  FLAREUPS ?  ?aspirin EC 81 MG tablet ?Take 1 tablet (81 mg total) by mouth daily. Swallow whole. ?  ?clotrimazole-betamethasone cream ?Commonly known as: LOTRISONE ?Apply 1 application topically 2 (two) times daily. ?  ?diclofenac 75 MG EC  tablet ?Commonly known as: VOLTAREN ?TAKE 1 TABLET TWICE DAILY FOR MUSCLE AND JOINT PAIN ?  ?fluticasone 50 MCG/ACT nasal spray ?Commonly known as: FLONASE ?Place 2 sprays into both nostrils as needed. ?  ?furosemide 20 MG tablet ?Commonly known as: LASIX ?Take 1 tablet (20 mg total) by mouth every morning. ?  ?meclizine 25 MG tablet ?Commonly known as: ANTIVERT ?TAKE 1 TABLET THREE TIMES DAILY AS NEEDED  FOR  DIZZINESS ?  ?olmesartan 40 MG tablet ?Commonly known as: BENICAR ?Take 1 tablet (40 mg  total) by mouth daily. ?What changed:  ?medication strength ?how much to take ?Changed by: Claretta Fraise, MD ?  ?rosuvastatin 20 MG tablet ?Commonly known as: CRESTOR ?TAKE 1 TABLET EVERY DAY ?  ?Ventolin HFA 108 (90 Base) MCG/ACT inhaler ?Generic drug: albuterol ?INHALE 2 PUFFS BY MOUTH EVERY 6 HOURS AS NEEDED FOR WHEEZING OR SHORTNESS OF BREATH ?  ? ?  ? ? ?Meds ordered this encounter  ?Medications  ? furosemide (LASIX) 20 MG tablet  ?  Sig: Take 1 tablet (20 mg total) by mouth every morning.  ?  Dispense:  90 tablet  ?  Refill:  3  ? olmesartan (BENICAR) 40 MG tablet  ?  Sig: Take 1 tablet (40 mg total) by mouth daily.  ?  Dispense:  90 tablet  ?  Refill:  3  ? diclofenac (VOLTAREN) 75 MG EC tablet  ?  Sig: TAKE 1 TABLET TWICE DAILY FOR MUSCLE AND JOINT PAIN  ?  Dispense:  180 tablet  ?  Refill:  3  ? ? ?Aic is 5.4, nondiabetic range today!  ? ?Follow-up: Return in about 6 months (around 09/16/2022). ? ?Claretta Fraise, M.D. ? ?

## 2022-03-17 LAB — CBC WITH DIFFERENTIAL/PLATELET
Basophils Absolute: 0.1 10*3/uL (ref 0.0–0.2)
Basos: 1 %
EOS (ABSOLUTE): 0.1 10*3/uL (ref 0.0–0.4)
Eos: 1 %
Hematocrit: 41.9 % (ref 34.0–46.6)
Hemoglobin: 13.7 g/dL (ref 11.1–15.9)
Immature Grans (Abs): 0 10*3/uL (ref 0.0–0.1)
Immature Granulocytes: 0 %
Lymphocytes Absolute: 2.1 10*3/uL (ref 0.7–3.1)
Lymphs: 26 %
MCH: 28.8 pg (ref 26.6–33.0)
MCHC: 32.7 g/dL (ref 31.5–35.7)
MCV: 88 fL (ref 79–97)
Monocytes Absolute: 0.7 10*3/uL (ref 0.1–0.9)
Monocytes: 8 %
Neutrophils Absolute: 5.3 10*3/uL (ref 1.4–7.0)
Neutrophils: 64 %
Platelets: 266 10*3/uL (ref 150–450)
RBC: 4.75 x10E6/uL (ref 3.77–5.28)
RDW: 14.2 % (ref 11.7–15.4)
WBC: 8.3 10*3/uL (ref 3.4–10.8)

## 2022-03-17 LAB — LIPID PANEL
Chol/HDL Ratio: 2.6 ratio (ref 0.0–4.4)
Cholesterol, Total: 143 mg/dL (ref 100–199)
HDL: 56 mg/dL (ref >39)
LDL Chol Calc (NIH): 73 mg/dL (ref 0–99)
Triglycerides: 71 mg/dL (ref 0–149)
VLDL Cholesterol Cal: 14 mg/dL (ref 5–40)

## 2022-03-17 LAB — CMP14+EGFR
ALT: 15 IU/L (ref 0–32)
AST: 21 IU/L (ref 0–40)
Albumin/Globulin Ratio: 1.8 (ref 1.2–2.2)
Albumin: 4.7 g/dL (ref 3.7–4.7)
Alkaline Phosphatase: 85 IU/L (ref 44–121)
BUN/Creatinine Ratio: 11 — ABNORMAL LOW (ref 12–28)
BUN: 11 mg/dL (ref 8–27)
Bilirubin Total: 1 mg/dL (ref 0.0–1.2)
CO2: 26 mmol/L (ref 20–29)
Calcium: 9.8 mg/dL (ref 8.7–10.3)
Chloride: 104 mmol/L (ref 96–106)
Creatinine, Ser: 0.96 mg/dL (ref 0.57–1.00)
Globulin, Total: 2.6 g/dL (ref 1.5–4.5)
Glucose: 94 mg/dL (ref 70–99)
Potassium: 4.1 mmol/L (ref 3.5–5.2)
Sodium: 144 mmol/L (ref 134–144)
Total Protein: 7.3 g/dL (ref 6.0–8.5)
eGFR: 63 mL/min/{1.73_m2} (ref >59)

## 2022-03-17 LAB — MICROALBUMIN / CREATININE URINE RATIO
Creatinine, Urine: 125.2 mg/dL
Microalb/Creat Ratio: 26 mg/g creat (ref 0–29)
Microalbumin, Urine: 32.9 ug/mL

## 2022-03-17 NOTE — Progress Notes (Signed)
Hello Emryn,  Your lab result is normal and/or stable.Some minor variations that are not significant are commonly marked abnormal, but do not represent any medical problem for you.  Best regards, Nailyn Dearinger, M.D.

## 2022-03-18 ENCOUNTER — Other Ambulatory Visit: Payer: Self-pay | Admitting: Cardiovascular Disease

## 2022-03-18 ENCOUNTER — Other Ambulatory Visit: Payer: Self-pay | Admitting: Family Medicine

## 2022-03-29 DIAGNOSIS — M9904 Segmental and somatic dysfunction of sacral region: Secondary | ICD-10-CM | POA: Diagnosis not present

## 2022-03-29 DIAGNOSIS — M62830 Muscle spasm of back: Secondary | ICD-10-CM | POA: Diagnosis not present

## 2022-03-29 DIAGNOSIS — M9901 Segmental and somatic dysfunction of cervical region: Secondary | ICD-10-CM | POA: Diagnosis not present

## 2022-03-29 DIAGNOSIS — M9903 Segmental and somatic dysfunction of lumbar region: Secondary | ICD-10-CM | POA: Diagnosis not present

## 2022-05-05 ENCOUNTER — Telehealth: Payer: Self-pay | Admitting: Family Medicine

## 2022-05-05 NOTE — Telephone Encounter (Signed)
Patient informed that the correct dose is '40mg'$ .  ? ?Patient verbalized understanding  ? ? ?

## 2022-05-05 NOTE — Telephone Encounter (Signed)
Patient has questions about olmesartan (BENICAR) 40 MG tablet because she thinks that the dosage has changed. Please call back.  ?

## 2022-05-10 ENCOUNTER — Other Ambulatory Visit: Payer: Self-pay | Admitting: Family Medicine

## 2022-05-11 ENCOUNTER — Telehealth: Payer: Self-pay | Admitting: *Deleted

## 2022-05-11 ENCOUNTER — Other Ambulatory Visit: Payer: Self-pay | Admitting: Family Medicine

## 2022-05-11 MED ORDER — CLOBETASOL PROPIONATE 0.05 % EX SOLN: Freq: Two times a day (BID) | CUTANEOUS | 3 refills | Status: AC

## 2022-05-11 NOTE — Telephone Encounter (Signed)
Fax from [facility] ?Request RF for Clobetasol 0.05% scalp solution ?Medication under Reconcile meds from December by Sandford Craze ?Please advise on RF ?

## 2022-05-11 NOTE — Telephone Encounter (Signed)
Patient informed. 

## 2022-05-11 NOTE — Telephone Encounter (Signed)
Please let the patient know that I sent their prescription to their pharmacy. Thanks, WS 

## 2022-05-12 ENCOUNTER — Telehealth: Payer: Self-pay | Admitting: *Deleted

## 2022-05-12 DIAGNOSIS — M9904 Segmental and somatic dysfunction of sacral region: Secondary | ICD-10-CM | POA: Diagnosis not present

## 2022-05-12 DIAGNOSIS — M9901 Segmental and somatic dysfunction of cervical region: Secondary | ICD-10-CM | POA: Diagnosis not present

## 2022-05-12 DIAGNOSIS — M62830 Muscle spasm of back: Secondary | ICD-10-CM | POA: Diagnosis not present

## 2022-05-12 DIAGNOSIS — M9903 Segmental and somatic dysfunction of lumbar region: Secondary | ICD-10-CM | POA: Diagnosis not present

## 2022-05-12 NOTE — Telephone Encounter (Signed)
Key: [id number] - PA Case ID: [id number] - Rx #: [id number] ? ?Drug ?Clotrimazole-Betamethasone 1-0.05% cream ?Form ?[employer] Electronic PA Form ? ?Sent to [facility] ?

## 2022-05-13 DIAGNOSIS — M62830 Muscle spasm of back: Secondary | ICD-10-CM | POA: Diagnosis not present

## 2022-05-13 DIAGNOSIS — M9901 Segmental and somatic dysfunction of cervical region: Secondary | ICD-10-CM | POA: Diagnosis not present

## 2022-05-13 DIAGNOSIS — M9903 Segmental and somatic dysfunction of lumbar region: Secondary | ICD-10-CM | POA: Diagnosis not present

## 2022-05-13 DIAGNOSIS — M9904 Segmental and somatic dysfunction of sacral region: Secondary | ICD-10-CM | POA: Diagnosis not present

## 2022-05-13 NOTE — Telephone Encounter (Signed)
Denied on May 17 You asked for the drug above for your [place] planopilaris. This is an off-label use that is not medically accepted. The Medicare rule in the Prescription Drug Benefit Manual (Chapter 6, Section 10.6) says a drug must be used for a medically accepted indication (covered use). Off-label use is medically accepted when there is proof in one or more of the drug guides that the drug works for your condition. We look at the two major drug guides (compendia): the [place] and the [place] (AHFS-DI). [employer] has decided that there is no proof in either drug guide that this drug works for your condition. Per Medicare rules, it is not covered.

## 2022-05-17 NOTE — Telephone Encounter (Signed)
LM to RC  

## 2022-05-17 NOTE — Telephone Encounter (Signed)
I can send in the betamethasone without the clotrimazole if she is willing to try that. It works well for most people.

## 2022-05-19 DIAGNOSIS — M62830 Muscle spasm of back: Secondary | ICD-10-CM | POA: Diagnosis not present

## 2022-05-19 DIAGNOSIS — M9904 Segmental and somatic dysfunction of sacral region: Secondary | ICD-10-CM | POA: Diagnosis not present

## 2022-05-19 DIAGNOSIS — M9901 Segmental and somatic dysfunction of cervical region: Secondary | ICD-10-CM | POA: Diagnosis not present

## 2022-05-19 DIAGNOSIS — M9903 Segmental and somatic dysfunction of lumbar region: Secondary | ICD-10-CM | POA: Diagnosis not present

## 2022-05-26 DIAGNOSIS — M9904 Segmental and somatic dysfunction of sacral region: Secondary | ICD-10-CM | POA: Diagnosis not present

## 2022-05-26 DIAGNOSIS — M62830 Muscle spasm of back: Secondary | ICD-10-CM | POA: Diagnosis not present

## 2022-05-26 DIAGNOSIS — M9903 Segmental and somatic dysfunction of lumbar region: Secondary | ICD-10-CM | POA: Diagnosis not present

## 2022-05-26 DIAGNOSIS — M9901 Segmental and somatic dysfunction of cervical region: Secondary | ICD-10-CM | POA: Diagnosis not present

## 2022-06-02 DIAGNOSIS — M9903 Segmental and somatic dysfunction of lumbar region: Secondary | ICD-10-CM | POA: Diagnosis not present

## 2022-06-02 DIAGNOSIS — M9901 Segmental and somatic dysfunction of cervical region: Secondary | ICD-10-CM | POA: Diagnosis not present

## 2022-06-02 DIAGNOSIS — M62830 Muscle spasm of back: Secondary | ICD-10-CM | POA: Diagnosis not present

## 2022-06-02 DIAGNOSIS — M9904 Segmental and somatic dysfunction of sacral region: Secondary | ICD-10-CM | POA: Diagnosis not present

## 2022-06-08 ENCOUNTER — Other Ambulatory Visit: Payer: Self-pay | Admitting: Family Medicine

## 2022-06-08 MED ORDER — BETAMETHASONE DIPROPIONATE AUG 0.05 % EX CREA: TOPICAL_CREAM | Freq: Two times a day (BID) | CUTANEOUS | 1 refills | Status: AC

## 2022-06-08 NOTE — Telephone Encounter (Signed)
Please let the patient know that I sent their prescription to their pharmacy. Thanks, WS 

## 2022-06-08 NOTE — Telephone Encounter (Signed)
Spoke to patient.  She would like Dr. Livia Snellen to please send betamethasone to her pharmacy. She states that she still needs the cream.

## 2022-06-09 DIAGNOSIS — L661 Lichen planopilaris: Secondary | ICD-10-CM | POA: Diagnosis not present

## 2022-06-10 ENCOUNTER — Ambulatory Visit (INDEPENDENT_AMBULATORY_CARE_PROVIDER_SITE_OTHER): Payer: Medicare Other

## 2022-06-10 VITALS — Wt 204.0 lb

## 2022-06-10 DIAGNOSIS — Z0000 Encounter for general adult medical examination without abnormal findings: Secondary | ICD-10-CM

## 2022-06-10 DIAGNOSIS — Z1211 Encounter for screening for malignant neoplasm of colon: Secondary | ICD-10-CM | POA: Diagnosis not present

## 2022-06-10 NOTE — Patient Instructions (Signed)
Bethany [name] , Thank you for taking time to come for your Medicare Wellness Visit. I appreciate your ongoing commitment to your health goals. Please review the following plan we discussed and let me know if I can assist you in the future.   Screening recommendations/referrals: Colonoscopy: Cologuard done 07/20/2019 - repeat every 3 years - I ordered this today Mammogram: Done 11/02/2021 - Repeat annually  Bone Density: Done 06/23/2018 - Repeat in 5 years Recommended yearly ophthalmology/optometry visit for glaucoma screening and checkup Recommended yearly dental visit for hygiene and checkup  Vaccinations: Influenza vaccine: Done 09/15/2021 - Repeat annually Pneumococcal vaccine: Done 10/01/2015 & 09/21/2017  Tdap vaccine: Done 09/03/2016 - Repeat in 10 years Shingles vaccine: Done    03/16/2022 & 05/20/2022 Covid-19: Done 02/26/2020, 03/25/2020, 11/14/2020, & 04/01/2021  Advanced directives: Please bring a copy of your health care power of attorney and living will to the office to be added to your chart at your convenience.  Conditions/risks identified: Keep up the great work walking and staying active - work on smaller portions and healthier food options - see the end of this summary for tips.  Next appointment: Follow up in one year for your annual wellness visit    Preventive Care 72 Years and Older, Female Preventive care refers to lifestyle choices and visits with your health care provider that can promote health and wellness. What does preventive care include? A yearly physical exam. This is also called an annual well check. Dental exams once or twice a year. Routine eye exams. Ask your health care provider how often you should have your eyes checked. Personal lifestyle choices, including: Daily care of your teeth and gums. Regular physical activity. Eating a healthy diet. Avoiding tobacco and drug use. Limiting alcohol use. Practicing safe sex. Taking low-dose aspirin every day. Taking  vitamin and mineral supplements as recommended by your health care provider. What happens during an annual well check? The services and screenings done by your health care provider during your annual well check will depend on your age, overall health, lifestyle risk factors, and family history of disease. Counseling  Your health care provider may ask you questions about your: Alcohol use. Tobacco use. Drug use. Emotional well-being. Home and relationship well-being. Sexual activity. Eating habits. History of falls. Memory and ability to understand (cognition). Work and work Statistician. Reproductive health. Screening  You may have the following tests or measurements: Height, weight, and BMI. Blood pressure. Lipid and cholesterol levels. These may be checked every 5 years, or more frequently if you are over 72 years old. Skin check. Lung cancer screening. You may have this screening every year starting at age 72 if you have a 30-pack-year history of smoking and currently smoke or have quit within the past 15 years. Fecal occult blood test (FOBT) of the stool. You may have this test every year starting at age 72. Flexible sigmoidoscopy or colonoscopy. You may have a sigmoidoscopy every 5 years or a colonoscopy every 10 years starting at age 72. Hepatitis C blood test. Hepatitis B blood test. Sexually transmitted disease (STD) testing. Diabetes screening. This is done by checking your blood sugar (glucose) after you have not eaten for a while (fasting). You may have this done every 1-3 years. Bone density scan. This is done to screen for osteoporosis. You may have this done starting at age 72. Mammogram. This may be done every 1-2 years. Talk to your health care provider about how often you should have regular mammograms. Talk with  your health care provider about your test results, treatment options, and if necessary, the need for more tests. Vaccines  Your health care provider may  recommend certain vaccines, such as: Influenza vaccine. This is recommended every year. Tetanus, diphtheria, and acellular pertussis (Tdap, Td) vaccine. You may need a Td booster every 10 years. Zoster vaccine. You may need this after age 72. Pneumococcal 13-valent conjugate (PCV13) vaccine. One dose is recommended after age 72. Pneumococcal polysaccharide (PPSV23) vaccine. One dose is recommended after age 72. Talk to your health care provider about which screenings and vaccines you need and how often you need them. This information is not intended to replace advice given to you by your health care provider. Make sure you discuss any questions you have with your health care provider. Document Released: 01/09/2016 Document Revised: 09/01/2016 Document Reviewed: 10/14/2015 Elsevier Interactive Patient Education  2017 [place] Prevention in the Home Falls can cause injuries. They can happen to people of all ages. There are many things you can do to make your home safe and to help prevent falls. What can I do on the outside of my home? Regularly fix the edges of walkways and driveways and fix any cracks. Remove anything that might make you trip as you walk through a door, such as a raised step or threshold. Trim any bushes or trees on the path to your home. Use bright outdoor lighting. Clear any walking paths of anything that might make someone trip, such as rocks or tools. Regularly check to see if handrails are loose or broken. Make sure that both sides of any steps have handrails. Any raised decks and porches should have guardrails on the edges. Have any leaves, snow, or ice cleared regularly. Use sand or salt on walking paths during winter. Clean up any spills in your garage right away. This includes oil or grease spills. What can I do in the bathroom? Use night lights. Install grab bars by the toilet and in the tub and shower. Do not use towel bars as grab bars. Use non-skid  mats or decals in the tub or shower. If you need to sit down in the shower, use a plastic, non-slip stool. Keep the floor dry. Clean up any water that spills on the floor as soon as it happens. Remove soap buildup in the tub or shower regularly. Attach bath mats securely with double-sided non-slip rug tape. Do not have throw rugs and other things on the floor that can make you trip. What can I do in the bedroom? Use night lights. Make sure that you have a light by your bed that is easy to reach. Do not use any sheets or blankets that are too big for your bed. They should not hang down onto the floor. Have a firm chair that has side arms. You can use this for support while you get dressed. Do not have throw rugs and other things on the floor that can make you trip. What can I do in the kitchen? Clean up any spills right away. Avoid walking on wet floors. Keep items that you use a lot in easy-to-reach places. If you need to reach something above you, use a strong step stool that has a grab bar. Keep electrical cords out of the way. Do not use floor polish or wax that makes floors slippery. If you must use wax, use non-skid floor wax. Do not have throw rugs and other things on the floor that can make you trip.  What can I do with my stairs? Do not leave any items on the stairs. Make sure that there are handrails on both sides of the stairs and use them. Fix handrails that are broken or loose. Make sure that handrails are as long as the stairways. Check any carpeting to make sure that it is firmly attached to the stairs. Fix any carpet that is loose or worn. Avoid having throw rugs at the top or bottom of the stairs. If you do have throw rugs, attach them to the floor with carpet tape. Make sure that you have a light switch at the top of the stairs and the bottom of the stairs. If you do not have them, ask someone to add them for you. What else can I do to help prevent falls? Wear shoes  that: Do not have high heels. Have rubber bottoms. Are comfortable and fit you well. Are closed at the toe. Do not wear sandals. If you use a stepladder: Make sure that it is fully opened. Do not climb a closed stepladder. Make sure that both sides of the stepladder are locked into place. Ask someone to hold it for you, if possible. Clearly mark and make sure that you can see: Any grab bars or handrails. First and last steps. Where the edge of each step is. Use tools that help you move around (mobility aids) if they are needed. These include: Canes. Walkers. Scooters. Crutches. Turn on the lights when you go into a dark area. Replace any light bulbs as soon as they burn out. Set up your furniture so you have a clear path. Avoid moving your furniture around. If any of your floors are uneven, fix them. If there are any pets around you, be aware of where they are. Review your medicines with your doctor. Some medicines can make you feel dizzy. This can increase your chance of falling. Ask your doctor what other things that you can do to help prevent falls. This information is not intended to replace advice given to you by your health care provider. Make sure you discuss any questions you have with your health care provider. Document Released: 10/09/2009 Document Revised: 05/20/2016 Document Reviewed: 01/17/2015 Elsevier Interactive Patient Education  2017 [employer].

## 2022-06-10 NOTE — Progress Notes (Signed)
Subjective:   Bethany [name] is a 71 y.o. female who presents for Medicare Annual (Subsequent) preventive examination.  Virtual Visit via Telephone Note  I connected with  Bethany [name] on 06/10/22 at  8:15 AM EDT by telephone and verified that I am speaking with the correct person using two identifiers.  Location: Patient: Home Provider: [facility] Persons participating in the virtual visit: patient/Nurse Health Advisor   I discussed the limitations, risks, security and privacy concerns of performing an evaluation and management service by telephone and the availability of in person appointments. The patient expressed understanding and agreed to proceed.  Interactive audio and video telecommunications were attempted between this nurse and patient, however failed, due to patient having technical difficulties OR patient did not have access to video capability.  We continued and completed visit with audio only.  Some vital signs may be absent or patient reported.   Davion Flannery E Auriah Hollings, LPN   Review of Systems     Cardiac Risk Factors include: advanced age (>58mn, >>39women);dyslipidemia;hypertension;obesity (BMI >30kg/m2)     Objective:    Today's Vitals   06/10/22 0821  Weight: 204 lb (92.5 kg)  PainSc: 3    Body mass index is 33.95 kg/m.     06/10/2022    8:29 AM 06/09/2021    8:37 AM 06/06/2020    8:46 AM 06/05/2019    9:11 AM 06/08/2018    1:17 PM 06/27/2015    8:28 AM 06/25/2015    9:46 AM  Advanced Directives  Does Patient Have a Medical Advance Directive? Yes Yes Yes Yes Yes No Yes  Type of [place] Living [place]   Healthcare Power of Attorney;Mental Health Advance Directive  Does patient want to make changes to medical advance directive?   No - Patient declined No - Patient declined     Copy of [place] Chart? No - copy  requested No - copy requested  No - copy requested       Current Medications (verified) Outpatient Encounter Medications as of 06/10/2022  Medication Sig   acyclovir (ZOVIRAX) 200 MG capsule TAKE 1 CAPSULE EVERY DAY AS NEEDED FOR FLAREUPS   aspirin EC 81 MG tablet Take 1 tablet (81 mg total) by mouth daily. Swallow whole.   augmented betamethasone dipropionate (DIPROLENE-AF) 0.05 % cream Apply topically 2 (two) times daily. At affected areas (avoid face and genitals)   clobetasol (TEMOVATE) 0.05 % external solution Apply topically 2 (two) times daily. To scalp   diclofenac (VOLTAREN) 75 MG EC tablet TAKE 1 TABLET TWICE DAILY FOR MUSCLE AND JOINT PAIN   fluticasone (FLONASE) 50 MCG/ACT nasal spray Place 2 sprays into both nostrils as needed.   furosemide (LASIX) 20 MG tablet Take 1 tablet (20 mg total) by mouth every morning.   meclizine (ANTIVERT) 25 MG tablet TAKE 1 TABLET THREE TIMES DAILY AS NEEDED  FOR  DIZZINESS   olmesartan (BENICAR) 40 MG tablet Take 1 tablet (40 mg total) by mouth daily.   rosuvastatin (CRESTOR) 20 MG tablet TAKE 1 TABLET EVERY DAY   VENTOLIN HFA 108 (90 Base) MCG/ACT inhaler INHALE 2 PUFFS BY MOUTH EVERY 6 HOURS AS NEEDED FOR WHEEZING OR SHORTNESS OF BREATH   No facility-administered encounter medications on file as of 06/10/2022.    Allergies (verified) Hctz [hydrochlorothiazide]   History: Past Medical History:  Diagnosis Date   Allergy 1997   knees  Arthritis    Diabetes mellitus without complication (HCC)    GERD (gastroesophageal reflux disease)    Hyperlipidemia    Hypertension    Obesity    Pneumonia    1 month ago - rsolved   Seasonal allergies    Sleep apnea    cpap   Past Surgical History:  Procedure Laterality Date   ABDOMINAL HYSTERECTOMY     BREAST SURGERY     fibroid cysts removed   GANGLION CYST EXCISION     left   phlebectomy     TOTAL KNEE ARTHROPLASTY Right 10/22/2013   Procedure: RIGHT TOTAL KNEE ARTHROPLASTY;  Surgeon:  Gearlean Alf, MD;  Location: [facility];  Service: Orthopedics;  Laterality: Right;   TOTAL KNEE ARTHROPLASTY Left 06/11/2015   Procedure: LEFT TOTAL KNEE ARTHROPLASTY;  Surgeon: Gaynelle Arabian, MD;  Location: [facility];  Service: Orthopedics;  Laterality: Left;   TUBAL LIGATION     Family History  Problem Relation Age of Onset   Heart disease Mother    Early death Father        Shooting   Diabetes Sister    Arthritis Brother        knees   Heart disease Sister        tumor in the heart   Social History   Socioeconomic History   Marital status: Married    Spouse name: Juanda Crumble   Number of children: 4   Years of education: 12   Highest education level: High school graduate  Occupational History   Occupation: retired    Fish farm manager: [employer]  Tobacco Use   Smoking status: Never   Smokeless tobacco: Never  Vaping Use   Vaping Use: Never used  Substance and Sexual Activity   Alcohol use: No   Drug use: No   Sexual activity: Not Currently    Comment: husband not able to have  sex  Other Topics Concern   Not on file  Social History Narrative   Lives home with her husband. Had 4 children, one passed away. Children live nearby, daughter lives next street over   Social Determinants of Health   Financial Resource Strain: Low Risk  (06/10/2022)   Overall Financial Resource Strain (CARDIA)    Difficulty of Paying Living Expenses: Not very hard  Food Insecurity: No Food Insecurity (06/10/2022)   Hunger Vital Sign    Worried About Running Out of Food in the Last Year: Never true    Ran Out of Food in the Last Year: Never true  Transportation Needs: No Transportation Needs (06/10/2022)   PRAPARE - Hydrologist (Medical): No    Lack of Transportation (Non-Medical): No  Physical Activity: Sufficiently Active (06/10/2022)   Exercise Vital Sign    Days of Exercise per Week: 7 days    Minutes of Exercise per Session: 30 min  Stress: No Stress Concern Present  (06/10/2022)   [place]    Feeling of Stress : Only a little  Social Connections: Socially Integrated (06/10/2022)   Social Connection and Isolation Panel [NHANES]    Frequency of Communication with Friends and Family: More than three times a week    Frequency of Social Gatherings with Friends and Family: More than three times a week    Attends Religious Services: More than 4 times per year    Active Member of [employer] or Organizations: Yes    Attends Archivist Meetings:  More than 4 times per year    Marital Status: Married    Tobacco Counseling Counseling given: Not Answered   Clinical Intake:  Pre-visit preparation completed: Yes  Pain : 0-10 Pain Score: 3  Pain Type: Chronic pain Pain Location: Shoulder Pain Orientation: Right, Left Pain Descriptors / Indicators: Aching, Discomfort Pain Onset: More than a month ago Pain Frequency: Intermittent     BMI - recorded: 33.95 Nutritional Status: BMI > 30  Obese Nutritional Risks: None Diabetes: No  How often do you need to have someone help you when you read instructions, pamphlets, or other written materials from your doctor or pharmacy?: 1 - Never  Diabetic? no  Interpreter Needed?: No  Information entered by :: Koleen Celia, LPN   Activities of Daily Living    06/10/2022    8:29 AM  In your present state of health, do you have any difficulty performing the following activities:  Hearing? 0  Vision? 0  Difficulty concentrating or making decisions? 0  Walking or climbing stairs? 1  Comment right leg is weak - she can do this slowly holding on  Dressing or bathing? 0  Doing errands, shopping? 0  Preparing Food and eating ? N  Using the Toilet? N  In the past six months, have you accidently leaked urine? N  Do you have problems with loss of bowel control? N  Managing your Medications? N  Managing your Finances? N  Housekeeping or  managing your Housekeeping? N    Patient Care Team: Claretta Fraise, MD as PCP - General (Family Medicine) Gaynelle Arabian, MD as Consulting Physician (Orthopedic Surgery) Ilean China, RN as Case Manager [place], Cassie Freer, MD as Consulting Physician (Cardiology) Sandford Craze, MD as Referring Physician (Dermatology) Celestia Khat, OD (Optometry)  Indicate any recent Medical Services you may have received from other than [facility] providers in the past year (date may be approximate).     Assessment:   This is a routine wellness examination for Kateena.  Hearing/Vision screen Hearing Screening - Comments:: Denies hearing difficulties  - sometimes gets fluid build up in right ear which limits hearing slightly Vision Screening - Comments:: Wears rx glasses - up to date with routine eye exams with  [facility]  Dietary issues and exercise activities discussed: Current Exercise Habits: Home exercise routine, Type of exercise: walking, Time (Minutes): 30, Frequency (Times/Week): 7, Weekly Exercise (Minutes/Week): 210, Intensity: Mild, Exercise limited by: orthopedic condition(s)   Goals Addressed             This Visit's Progress    Exercise 150 min/wk Moderate Activity   On track    Patient Stated       06/10/22 - hopes to get her home remodeled/ updated over the next year       Depression Screen    06/10/2022    8:26 AM 03/16/2022   10:00 AM 09/15/2021   10:14 AM 09/15/2021   10:06 AM 08/21/2021   10:10 AM 06/09/2021    8:27 AM 01/22/2021    8:09 AM  PHQ 2/9 Scores  PHQ - 2 Score 0 0 0 0 1 0 0  PHQ- 9 Score   2  3      Fall Risk    06/10/2022    8:22 AM 03/16/2022   10:00 AM 09/15/2021   10:06 AM 06/09/2021    8:36 AM 01/22/2021    8:09 AM  Fall Risk   Falls in the past year? 0 0 0 0  1  Number falls in past yr: 0   0 0  Injury with Fall? 0   0 1  Risk for fall due to : No Fall Risks   History of fall(s) History of fall(s)  Follow up Falls prevention discussed    Falls prevention discussed Falls evaluation completed    [place]:  Any stairs in or around the home? No  If so, are there any without handrails? No  Home free of loose throw rugs in walkways, pet beds, electrical cords, etc? Yes  Adequate lighting in your home to reduce risk of falls? Yes   ASSISTIVE DEVICES UTILIZED TO PREVENT FALLS:  Life alert? No  Use of a cane, walker or w/c? No  Grab bars in the bathroom? Yes  Shower chair or bench in shower? Yes  Elevated toilet seat or a handicapped toilet? Yes   TIMED UP AND GO:  Was the test performed? No . Telephonic visit  Cognitive Function:        06/10/2022    8:30 AM 06/06/2020    8:49 AM 06/05/2019    9:12 AM  6CIT Screen  What Year? 0 points 0 points 0 points  What month? 0 points 0 points 0 points  What time? 0 points 0 points 0 points  Count back from 20 0 points 0 points 0 points  Months in reverse 0 points 0 points 0 points  Repeat phrase 0 points 0 points 0 points  Total Score 0 points 0 points 0 points    Immunizations Immunization History  Administered Date(s) Administered   Fluad Quad(high Dose 65+) 10/24/2019, 10/20/2020, 09/15/2021   Influenza, High Dose Seasonal PF 10/08/2016, 09/21/2017, 10/02/2018   Influenza,inj,Quad PF,6+ Mos 09/17/2013, 10/01/2015   Influenza-Unspecified 09/26/2014   Moderna Sars-Covid-2 Vaccination 02/26/2020, 03/25/2020, 11/14/2020, 04/01/2021   Pneumococcal Conjugate-13 10/01/2015   Pneumococcal Polysaccharide-23 09/21/2017   Tdap 09/03/2016   Zoster Recombinat (Shingrix) 03/16/2022, 05/20/2022    TDAP status: Up to date  Flu Vaccine status: Up to date  Pneumococcal vaccine status: Up to date  Covid-19 vaccine status: Completed vaccines  Qualifies for Shingles Vaccine? Yes   Zostavax completed Yes   Shingrix Completed?: Yes  Screening Tests Health Maintenance  Topic Date Due   COVID-19 Vaccine (5 - Moderna series) 05/27/2021    Fecal DNA (Cologuard)  07/19/2022   INFLUENZA VACCINE  07/27/2022   MAMMOGRAM  11/02/2022   DEXA SCAN  06/24/2023   TETANUS/TDAP  09/03/2026   Pneumonia Vaccine 28+ Years old  Completed   Hepatitis C Screening  Completed   Zoster Vaccines- Shingrix  Completed   HPV VACCINES  Aged Out    Health Maintenance  Health Maintenance Due  Topic Date Due   COVID-19 Vaccine (5 - Moderna series) 05/27/2021    Colorectal cancer screening: Type of screening: Cologuard. Completed 07/20/2019. Repeat every 3 years Ordered today since it is due next month  Mammogram status: Completed 11/02/2021. Repeat every year  Bone Density status: Completed 06/23/2018. Results reflect: Bone density results: NORMAL. Repeat every 5 years.  Lung Cancer Screening: (Low Dose CT Chest recommended if Age 19-80 years, 30 pack-year currently smoking OR have quit w/in 15years.) does not qualify  Additional Screening:  Hepatitis C Screening: does qualify; Completed 09/03/2016  Vision Screening: Recommended annual ophthalmology exams for early detection of glaucoma and other disorders of the eye. Is the patient up to date with their annual eye exam?  Yes  Who is the provider  or what is the name of the office in which the patient attends annual eye exams? [facility] If pt is not established with a provider, would they like to be referred to a provider to establish care? No .   Dental Screening: Recommended annual dental exams for proper oral hygiene  Community Resource Referral / Chronic Care Management: CRR required this visit?  No   CCM required this visit?  No      Plan:     I have personally reviewed and noted the following in the patient's chart:   Medical and social history Use of alcohol, tobacco or illicit drugs  Current medications and supplements including opioid prescriptions.  Functional ability and status Nutritional status Physical activity Advanced directives List of other  physicians Hospitalizations, surgeries, and ER visits in previous 12 months Vitals Screenings to include cognitive, depression, and falls Referrals and appointments  In addition, I have reviewed and discussed with patient certain preventive protocols, quality metrics, and best practice recommendations. A written personalized care plan for preventive services as well as general preventive health recommendations were provided to patient.     Sandrea Hammond, LPN   3/38/2505   Nurse Notes: None

## 2022-06-16 DIAGNOSIS — M9904 Segmental and somatic dysfunction of sacral region: Secondary | ICD-10-CM | POA: Diagnosis not present

## 2022-06-16 DIAGNOSIS — M62830 Muscle spasm of back: Secondary | ICD-10-CM | POA: Diagnosis not present

## 2022-06-16 DIAGNOSIS — M9901 Segmental and somatic dysfunction of cervical region: Secondary | ICD-10-CM | POA: Diagnosis not present

## 2022-06-16 DIAGNOSIS — M9903 Segmental and somatic dysfunction of lumbar region: Secondary | ICD-10-CM | POA: Diagnosis not present

## 2022-06-30 DIAGNOSIS — M62830 Muscle spasm of back: Secondary | ICD-10-CM | POA: Diagnosis not present

## 2022-06-30 DIAGNOSIS — M9901 Segmental and somatic dysfunction of cervical region: Secondary | ICD-10-CM | POA: Diagnosis not present

## 2022-06-30 DIAGNOSIS — M9903 Segmental and somatic dysfunction of lumbar region: Secondary | ICD-10-CM | POA: Diagnosis not present

## 2022-06-30 DIAGNOSIS — M9904 Segmental and somatic dysfunction of sacral region: Secondary | ICD-10-CM | POA: Diagnosis not present

## 2022-07-09 ENCOUNTER — Ambulatory Visit: Payer: Self-pay | Admitting: *Deleted

## 2022-07-09 NOTE — Chronic Care Management (AMB) (Signed)
  Chronic Care Management   Note  07/09/2022 Name: Bethany [name] MRN: [id number] DOB: 05/14/1951   Patient has either met RN Care Management goals, is stable from [place] Management perspective, or has not recently engaged with the RN Care Manager. I am removing RN Care Manager from Care Team and closing [place]. If patient is currently engaged with another CCM team member I will forward this encounter to inform them of my case closure. Patient may be eligible for re-engagement with RN Care Manager in the future if necessary and can discuss this with their PCP.  Chong Sicilian, BSN, RN-BC Embedded Chronic Care Manager [facility] [place] [facility] Direct Dial: [phone]

## 2022-07-20 DIAGNOSIS — M79676 Pain in unspecified toe(s): Secondary | ICD-10-CM | POA: Diagnosis not present

## 2022-07-20 DIAGNOSIS — L84 Corns and callosities: Secondary | ICD-10-CM | POA: Diagnosis not present

## 2022-07-20 DIAGNOSIS — B351 Tinea unguium: Secondary | ICD-10-CM | POA: Diagnosis not present

## 2022-07-20 DIAGNOSIS — E1142 Type 2 diabetes mellitus with diabetic polyneuropathy: Secondary | ICD-10-CM | POA: Diagnosis not present

## 2022-07-24 ENCOUNTER — Other Ambulatory Visit: Payer: Self-pay | Admitting: Family Medicine

## 2022-07-26 DIAGNOSIS — Z1211 Encounter for screening for malignant neoplasm of colon: Secondary | ICD-10-CM | POA: Diagnosis not present

## 2022-08-02 ENCOUNTER — Ambulatory Visit (INDEPENDENT_AMBULATORY_CARE_PROVIDER_SITE_OTHER): Payer: Medicare Other | Admitting: Nurse Practitioner

## 2022-08-02 ENCOUNTER — Encounter: Payer: Self-pay | Admitting: Nurse Practitioner

## 2022-08-02 VITALS — BP 163/100 | HR 69 | Ht 65.0 in | Wt 207.0 lb

## 2022-08-02 DIAGNOSIS — M25572 Pain in left ankle and joints of left foot: Secondary | ICD-10-CM

## 2022-08-02 LAB — COLOGUARD: COLOGUARD: POSITIVE — AB

## 2022-08-02 NOTE — Progress Notes (Signed)
Acute Office Visit  Subjective:     Patient ID: Bethany [name], female    DOB: 21-Dec-1951, 71 y.o.   MRN: [id number]  Chief Complaint  Patient presents with   Leg Pain    Left side   Hip Pain    Left side    Leg Pain  The incident occurred 2 days ago. The incident occurred at home. The injury mechanism was a twisting injury. The pain is present in the left ankle. The quality of the pain is described as aching. The pain is at a severity of 8/10. The pain is severe. The pain has been Constant since onset. Pertinent negatives include no loss of sensation, numbness or tingling. She reports no foreign bodies present. The symptoms are aggravated by movement and palpation. She has tried ice for the symptoms. The treatment provided no relief.  Hip Pain  The incident occurred 2 days ago. The incident occurred at home. The injury mechanism was a twisting injury. The pain is present in the left ankle. The quality of the pain is described as aching. The pain is at a severity of 8/10. The pain is severe. The pain has been Constant since onset. Pertinent negatives include no loss of sensation, numbness or tingling. She reports no foreign bodies present. The symptoms are aggravated by movement. She has tried ice for the symptoms. The treatment provided mild relief.     Review of Systems  Constitutional: Negative.   HENT: Negative.    Respiratory: Negative.    Cardiovascular: Negative.   Genitourinary: Negative.   Musculoskeletal:  Positive for joint pain.  Skin: Negative.  Negative for rash.  Neurological:  Negative for tingling and numbness.  All other systems reviewed and are negative.       Objective:    BP (!) 163/100   Pulse 69   Ht '5\' 5"'$  (1.651 m)   Wt 207 lb (93.9 kg)   SpO2 95%   BMI 34.45 kg/m  BP Readings from Last 3 Encounters:  08/02/22 (!) 163/100  03/16/22 (!) 176/97  09/15/21 133/83   Wt Readings from Last 3 Encounters:  08/02/22 207 lb (93.9 kg)  06/10/22 204 lb  (92.5 kg)  03/16/22 204 lb 6.4 oz (92.7 kg)      Physical Exam Vitals and nursing note reviewed.  Constitutional:      Appearance: Normal appearance.  HENT:     Head: Normocephalic.     Right Ear: External ear normal.     Left Ear: External ear normal.     Nose: Nose normal.     Mouth/Throat:     Mouth: Mucous membranes are moist.     Pharynx: Oropharynx is clear.  Eyes:     Conjunctiva/sclera: Conjunctivae normal.  Cardiovascular:     Rate and Rhythm: Normal rate and regular rhythm.     Pulses: Normal pulses.     Heart sounds: Normal heart sounds.  Pulmonary:     Effort: Pulmonary effort is normal.     Breath sounds: Normal breath sounds.  Abdominal:     General: Bowel sounds are normal.  Musculoskeletal:     Left hip: Tenderness present.     Left ankle: Swelling present. Tenderness present. Decreased range of motion.       Legs:     Comments: Left hip pain with left ankle swelling and tenderness.  Skin:    General: Skin is warm.     Findings: No rash.  Neurological:     Mental  Status: She is alert and oriented to person, place, and time.  Psychiatric:        Behavior: Behavior normal.     No results found for any visits on 08/02/22.      Assessment & Plan:  Pain and discomfort with in left hip and left ankle not well controlled.  Patient was following with her new dog and twisted ankle but did not completely fall.  Left ankle x-ray completed results pending.  Continue Voltaren 75 mg tablet by mouth twice daily as prescribed.  Elevate ankle, ice 24 to 48 hours, warm compress as tolerated.  Follow-up with worsening unresolved symptoms. Problem List Items Addressed This Visit   None Visit Diagnoses     Acute left ankle pain    -  Primary       No orders of the defined types were placed in this encounter.   Return if symptoms worsen or fail to improve.  Ivy Lynn, NP

## 2022-08-02 NOTE — Patient Instructions (Signed)
Leg Cramps Leg cramps occur when one or more muscles tighten and a person has no control over it (involuntary muscle contraction). Muscle cramps are most common in the calf muscles of the leg. They can occur during exercise or at rest. Leg cramps are painful, and they may last for a few seconds to a few minutes. Cramps may return several times before they finally stop. Usually, leg cramps are not caused by a serious medical problem. In many cases, the cause is not known. Some common causes include: Excessive physical effort (overexertion), such as during intense exercise. Doing the same motion over and over. Staying in a certain position for a long period of time. Improper preparation, form, or technique while doing a sport or an activity. Dehydration. Injury. Side effects of certain medicines. Abnormally low levels of minerals in your blood (electrolytes), especially potassium and calcium. This could result from: Pregnancy. Taking diuretic medicines. Follow these instructions at home: Eating and drinking Drink enough fluid to keep your urine pale yellow. Staying hydrated may help prevent cramps. Eat a healthy diet that includes plenty of nutrients to help your muscles function. A healthy diet includes fruits and vegetables, lean protein, whole grains, and low-fat or nonfat dairy products. Managing pain, stiffness, and swelling     Try massaging, stretching, and relaxing the affected muscle. Do this for several minutes at a time. If directed, put ice on areas that are sore or painful after a cramp. To do this: Put ice in a plastic bag. Place a towel between your skin and the bag. Leave the ice on for 20 minutes, 2-3 times a day. Remove the ice if your skin turns bright red. This is very important. If you cannot feel pain, heat, or cold, you have a greater risk of damage to the area. If directed, apply heat to muscles that are tense or tight. Do this before you exercise, or as often as told  by your health care provider. Use the heat source that your health care provider recommends, such as a moist heat pack or a heating pad. To do this: Place a towel between your skin and the heat source. Leave the heat on for 20-30 minutes. Remove the heat if your skin turns bright red. This is especially important if you are unable to feel pain, heat, or cold. You may have a greater risk of getting burned. Try taking hot showers or baths to help relax tight muscles. General instructions If you are having frequent leg cramps, avoid intense exercise for several days. Take over-the-counter and prescription medicines only as told by your health care provider. Keep all follow-up visits. This is important. Contact a health care provider if: Your leg cramps get more severe or more frequent, or they do not improve over time. Your foot becomes cold, numb, or blue. Summary Muscle cramps can develop in any muscle, but the most common place is in the calf muscles of the leg. Leg cramps are painful, and they may last for a few seconds to a few minutes. Usually, leg cramps are not caused by a serious medical problem. Often, the cause is not known. Stay hydrated, and take over-the-counter and prescription medicines only as told by your health care provider. This information is not intended to replace advice given to you by your health care provider. Make sure you discuss any questions you have with your health care provider. Document Revised: 04/30/2020 Document Reviewed: 04/30/2020 Elsevier Patient Education  2023 [employer]  

## 2022-08-10 ENCOUNTER — Encounter: Payer: Self-pay | Admitting: Family Medicine

## 2022-08-10 ENCOUNTER — Ambulatory Visit (INDEPENDENT_AMBULATORY_CARE_PROVIDER_SITE_OTHER): Payer: Medicare Other | Admitting: Family Medicine

## 2022-08-10 ENCOUNTER — Other Ambulatory Visit: Payer: Self-pay | Admitting: Family Medicine

## 2022-08-10 VITALS — BP 133/91 | HR 76 | Temp 97.0°F | Ht 65.0 in | Wt 198.4 lb

## 2022-08-10 DIAGNOSIS — M79672 Pain in left foot: Secondary | ICD-10-CM | POA: Diagnosis not present

## 2022-08-10 DIAGNOSIS — R195 Other fecal abnormalities: Secondary | ICD-10-CM

## 2022-08-10 DIAGNOSIS — M79605 Pain in left leg: Secondary | ICD-10-CM

## 2022-08-10 MED ORDER — PREDNISONE 10 MG PO TABS: ORAL_TABLET | ORAL | 0 refills | Status: DC

## 2022-08-10 NOTE — Progress Notes (Signed)
Subjective:  Patient ID: Bethany [name], female    DOB: [birth year]  Age: 71 y.o. MRN: [id number]  CC: Leg Pain   HPI Bethany [name] presents for 3 weeks of LLE pain. Got a puppy. Too active for her. Jerked her leg to the side and felt a pop in the foot. Feet tingling. Treated by Dr Irving Shows the next day. Still having pain and popping      08/10/2022    9:46 AM 08/02/2022    9:04 AM 06/10/2022    8:26 AM  Depression screen PHQ 2/9  Decreased Interest 0 0 0  Down, Depressed, Hopeless 0 0 0  PHQ - 2 Score 0 0 0    History Bethany [name] has a past medical history of Allergy (1997), Arthritis, Diabetes mellitus without complication ([place]), GERD (gastroesophageal reflux disease), Hyperlipidemia, Hypertension, Obesity, Pneumonia, Seasonal allergies, and Sleep apnea.   She has a past surgical history that includes Abdominal hysterectomy; Tubal ligation; phlebectomy; Total knee arthroplasty (Right, 10/22/2013); Ganglion cyst excision; Breast surgery; and Total knee arthroplasty (Left, 06/11/2015).   Her family history includes Arthritis in her brother; Diabetes in her sister; Early death in her father; Heart disease in her mother and sister.She reports that she has never smoked. She has never used smokeless tobacco. She reports that she does not drink alcohol and does not use drugs.    ROS Review of Systems  Constitutional:  Negative for fever.  Musculoskeletal:  Positive for gait problem.    Objective:  BP (!) 133/91   Pulse 76   Temp (!) 97 F (36.1 C)   Ht '5\' 5"'$  (1.651 m)   Wt 198 lb 6.4 oz (90 kg)   SpO2 96%   BMI 33.02 kg/m   BP Readings from Last 3 Encounters:  08/10/22 (!) 133/91  08/02/22 (!) 163/100  03/16/22 (!) 176/97    Wt Readings from Last 3 Encounters:  08/10/22 198 lb 6.4 oz (90 kg)  08/02/22 207 lb (93.9 kg)  06/10/22 204 lb (92.5 kg)     Physical Exam Musculoskeletal:        General: Tenderness (left dorsal midfoot. Popping and tender. Pulse intact. Sensation  intact. Radiates up the anterolateral leg to the knee and anterior thigh with tenderness) present. Normal range of motion.       Assessment & Plan:   Bethany [name] was seen today for leg pain.  Diagnoses and all orders for this visit:  Left leg pain -     Ambulatory referral to Physical Therapy  Left foot pain -     Ambulatory referral to Physical Therapy  Other orders -     predniSONE (DELTASONE) 10 MG tablet; Take 5 daily for 2 days followed by 4,3,2 and 1 for 2 days each.       I am having Bethany [name] start on predniSONE. I am also having her maintain her Ventolin HFA, fluticasone, rosuvastatin, aspirin EC, olmesartan, diclofenac, acyclovir, meclizine, clobetasol, augmented betamethasone dipropionate, and furosemide.  Allergies as of 08/10/2022       Reactions   Hctz [hydrochlorothiazide]    Hair loss        Medication List        Accurate as of August 10, 2022 10:09 AM. If you have any questions, ask your nurse or doctor.          acyclovir 200 MG capsule Commonly known as: ZOVIRAX TAKE 1 CAPSULE EVERY DAY AS NEEDED FOR FLAREUPS   aspirin EC 81 MG tablet Take 1  tablet (81 mg total) by mouth daily. Swallow whole.   augmented betamethasone dipropionate 0.05 % cream Commonly known as: DIPROLENE-AF Apply topically 2 (two) times daily. At affected areas (avoid face and genitals)   clobetasol 0.05 % external solution Commonly known as: TEMOVATE Apply topically 2 (two) times daily. To scalp   diclofenac 75 MG EC tablet Commonly known as: VOLTAREN TAKE 1 TABLET TWICE DAILY FOR MUSCLE AND JOINT PAIN   fluticasone 50 MCG/ACT nasal spray Commonly known as: FLONASE Place 2 sprays into both nostrils as needed.   furosemide 20 MG tablet Commonly known as: LASIX TAKE 1 TABLET (20 MG TOTAL) BY MOUTH EVERY MORNING.   meclizine 25 MG tablet Commonly known as: ANTIVERT TAKE 1 TABLET THREE TIMES DAILY AS NEEDED  FOR  DIZZINESS   olmesartan 40 MG  tablet Commonly known as: BENICAR Take 1 tablet (40 mg total) by mouth daily.   predniSONE 10 MG tablet Commonly known as: DELTASONE Take 5 daily for 2 days followed by 4,3,2 and 1 for 2 days each. Started by: Claretta Fraise, MD   rosuvastatin 20 MG tablet Commonly known as: CRESTOR TAKE 1 TABLET EVERY DAY   Ventolin HFA 108 (90 Base) MCG/ACT inhaler Generic drug: albuterol INHALE 2 PUFFS BY MOUTH EVERY 6 HOURS AS NEEDED FOR WHEEZING OR SHORTNESS OF BREATH         Follow-up: Return in about 6 weeks (around 09/21/2022), or if symptoms worsen or fail to improve.  Claretta Fraise, M.D.

## 2022-08-12 ENCOUNTER — Encounter: Payer: Self-pay | Admitting: *Deleted

## 2022-08-12 DIAGNOSIS — M9903 Segmental and somatic dysfunction of lumbar region: Secondary | ICD-10-CM | POA: Diagnosis not present

## 2022-08-12 DIAGNOSIS — M9901 Segmental and somatic dysfunction of cervical region: Secondary | ICD-10-CM | POA: Diagnosis not present

## 2022-08-12 DIAGNOSIS — M9904 Segmental and somatic dysfunction of sacral region: Secondary | ICD-10-CM | POA: Diagnosis not present

## 2022-08-12 DIAGNOSIS — M62830 Muscle spasm of back: Secondary | ICD-10-CM | POA: Diagnosis not present

## 2022-08-16 DIAGNOSIS — M9903 Segmental and somatic dysfunction of lumbar region: Secondary | ICD-10-CM | POA: Diagnosis not present

## 2022-08-16 DIAGNOSIS — M9901 Segmental and somatic dysfunction of cervical region: Secondary | ICD-10-CM | POA: Diagnosis not present

## 2022-08-16 DIAGNOSIS — M62830 Muscle spasm of back: Secondary | ICD-10-CM | POA: Diagnosis not present

## 2022-08-16 DIAGNOSIS — M9904 Segmental and somatic dysfunction of sacral region: Secondary | ICD-10-CM | POA: Diagnosis not present

## 2022-08-18 DIAGNOSIS — M9901 Segmental and somatic dysfunction of cervical region: Secondary | ICD-10-CM | POA: Diagnosis not present

## 2022-08-18 DIAGNOSIS — M9904 Segmental and somatic dysfunction of sacral region: Secondary | ICD-10-CM | POA: Diagnosis not present

## 2022-08-18 DIAGNOSIS — M62830 Muscle spasm of back: Secondary | ICD-10-CM | POA: Diagnosis not present

## 2022-08-18 DIAGNOSIS — M9903 Segmental and somatic dysfunction of lumbar region: Secondary | ICD-10-CM | POA: Diagnosis not present

## 2022-08-23 DIAGNOSIS — M62830 Muscle spasm of back: Secondary | ICD-10-CM | POA: Diagnosis not present

## 2022-08-23 DIAGNOSIS — M9901 Segmental and somatic dysfunction of cervical region: Secondary | ICD-10-CM | POA: Diagnosis not present

## 2022-08-23 DIAGNOSIS — M9904 Segmental and somatic dysfunction of sacral region: Secondary | ICD-10-CM | POA: Diagnosis not present

## 2022-08-23 DIAGNOSIS — M9903 Segmental and somatic dysfunction of lumbar region: Secondary | ICD-10-CM | POA: Diagnosis not present

## 2022-08-26 DIAGNOSIS — M62830 Muscle spasm of back: Secondary | ICD-10-CM | POA: Diagnosis not present

## 2022-08-26 DIAGNOSIS — M9903 Segmental and somatic dysfunction of lumbar region: Secondary | ICD-10-CM | POA: Diagnosis not present

## 2022-08-26 DIAGNOSIS — M9901 Segmental and somatic dysfunction of cervical region: Secondary | ICD-10-CM | POA: Diagnosis not present

## 2022-08-26 DIAGNOSIS — M9904 Segmental and somatic dysfunction of sacral region: Secondary | ICD-10-CM | POA: Diagnosis not present

## 2022-09-01 DIAGNOSIS — M9903 Segmental and somatic dysfunction of lumbar region: Secondary | ICD-10-CM | POA: Diagnosis not present

## 2022-09-01 DIAGNOSIS — M62830 Muscle spasm of back: Secondary | ICD-10-CM | POA: Diagnosis not present

## 2022-09-01 DIAGNOSIS — M9904 Segmental and somatic dysfunction of sacral region: Secondary | ICD-10-CM | POA: Diagnosis not present

## 2022-09-01 DIAGNOSIS — M9901 Segmental and somatic dysfunction of cervical region: Secondary | ICD-10-CM | POA: Diagnosis not present

## 2022-09-02 ENCOUNTER — Encounter: Payer: Self-pay | Admitting: Internal Medicine

## 2022-09-02 ENCOUNTER — Encounter: Payer: Self-pay | Admitting: *Deleted

## 2022-09-02 ENCOUNTER — Ambulatory Visit (INDEPENDENT_AMBULATORY_CARE_PROVIDER_SITE_OTHER): Payer: Medicare Other | Admitting: Internal Medicine

## 2022-09-02 VITALS — BP 152/90 | HR 60 | Temp 97.3°F | Ht 65.0 in | Wt 199.2 lb

## 2022-09-02 DIAGNOSIS — K5904 Chronic idiopathic constipation: Secondary | ICD-10-CM

## 2022-09-02 DIAGNOSIS — R195 Other fecal abnormalities: Secondary | ICD-10-CM

## 2022-09-02 NOTE — Progress Notes (Signed)
Primary Care Physician:  Claretta Fraise, MD Primary Gastroenterologist:  Dr. Abbey Chatters  Chief Complaint  Patient presents with   Colon Cancer Screening    Positive cologuard    HPI:   Bethany [name] is a 71 y.o. female who presents to the clinic today by referral from her PCP Dr. Livia Snellen for evaluation.  Patient recently underwent Cologuard testing which was positive.  States her last colonoscopy was over 10 years ago.  Denies any personal history of polyps.  Does note her sister had numerous polyps removed.  Otherwise no family history of colorectal malignancy.  Denies any melena hematochezia.  No abdominal pain.  No unintentional weight loss.  Denies any upper GI symptoms including heartburn, reflux, epigastric/chest pain, dysphagia/odynophagia.  Does have mild constipation.  Takes over-the-counter stool softener which keeps her symptoms under control.  Notes normal bowel movements regularly if she takes her stool softener.  No straining.  Past Medical History:  Diagnosis Date   Allergy 1997   knees   Arthritis    Diabetes mellitus without complication (HCC)    GERD (gastroesophageal reflux disease)    Hyperlipidemia    Hypertension    Obesity    Pneumonia    1 month ago - rsolved   Seasonal allergies    Sleep apnea    cpap    Past Surgical History:  Procedure Laterality Date   ABDOMINAL HYSTERECTOMY     BREAST SURGERY     fibroid cysts removed   GANGLION CYST EXCISION     left   phlebectomy     TOTAL KNEE ARTHROPLASTY Right 10/22/2013   Procedure: RIGHT TOTAL KNEE ARTHROPLASTY;  Surgeon: Gearlean Alf, MD;  Location: [facility];  Service: Orthopedics;  Laterality: Right;   TOTAL KNEE ARTHROPLASTY Left 06/11/2015   Procedure: LEFT TOTAL KNEE ARTHROPLASTY;  Surgeon: Gaynelle Arabian, MD;  Location: [facility];  Service: Orthopedics;  Laterality: Left;   TUBAL LIGATION      Current Outpatient Medications  Medication Sig Dispense Refill   acyclovir (ZOVIRAX) 200 MG capsule  TAKE 1 CAPSULE EVERY DAY AS NEEDED FOR FLAREUPS 90 capsule 1   aspirin EC 81 MG tablet Take 1 tablet (81 mg total) by mouth daily. Swallow whole. 90 tablet 1   augmented betamethasone dipropionate (DIPROLENE-AF) 0.05 % cream Apply topically 2 (two) times daily. At affected areas (avoid face and genitals) 50 g 1   clobetasol (TEMOVATE) 0.05 % external solution Apply topically 2 (two) times daily. To scalp 50 mL 3   diclofenac (VOLTAREN) 75 MG EC tablet TAKE 1 TABLET TWICE DAILY FOR MUSCLE AND JOINT PAIN 180 tablet 3   fluticasone (FLONASE) 50 MCG/ACT nasal spray Place 2 sprays into both nostrils as needed. 48 g 1   furosemide (LASIX) 20 MG tablet TAKE 1 TABLET (20 MG TOTAL) BY MOUTH EVERY MORNING. 90 tablet 0   meclizine (ANTIVERT) 25 MG tablet TAKE 1 TABLET THREE TIMES DAILY AS NEEDED  FOR  DIZZINESS 270 tablet 1   olmesartan (BENICAR) 40 MG tablet Take 1 tablet (40 mg total) by mouth daily. 90 tablet 3   rosuvastatin (CRESTOR) 20 MG tablet TAKE 1 TABLET EVERY DAY 90 tablet 3   VENTOLIN HFA 108 (90 Base) MCG/ACT inhaler INHALE 2 PUFFS BY MOUTH EVERY 6 HOURS AS NEEDED FOR WHEEZING OR SHORTNESS OF BREATH 18 g 3   No current facility-administered medications for this visit.    Allergies as of 09/02/2022 - Review Complete 09/02/2022  Allergen Reaction Noted  Hctz [hydrochlorothiazide]  09/06/2017    Family History  Problem Relation Age of Onset   Heart disease Mother    Early death Father        Shooting   Diabetes Sister    Arthritis Brother        knees   Heart disease Sister        tumor in the heart    Social History   Socioeconomic History   Marital status: Married    Spouse name: Juanda Crumble   Number of children: 4   Years of education: 12   Highest education level: High school graduate  Occupational History   Occupation: retired    Fish farm manager: [employer]  Tobacco Use   Smoking status: Never   Smokeless tobacco: Never  Vaping Use   Vaping Use: Never used  Substance and  Sexual Activity   Alcohol use: No   Drug use: No   Sexual activity: Not Currently    Comment: husband not able to have  sex  Other Topics Concern   Not on file  Social History Narrative   Lives home with her husband. Had 4 children, one passed away. Children live nearby, daughter lives next street over   Social Determinants of Health   Financial Resource Strain: Low Risk  (06/10/2022)   Overall Financial Resource Strain (CARDIA)    Difficulty of Paying Living Expenses: Not very hard  Food Insecurity: No Food Insecurity (06/10/2022)   Hunger Vital Sign    Worried About Running Out of Food in the Last Year: Never true    Ran Out of Food in the Last Year: Never true  Transportation Needs: No Transportation Needs (06/10/2022)   PRAPARE - Hydrologist (Medical): No    Lack of Transportation (Non-Medical): No  Physical Activity: Sufficiently Active (06/10/2022)   Exercise Vital Sign    Days of Exercise per Week: 7 days    Minutes of Exercise per Session: 30 min  Stress: No Stress Concern Present (06/10/2022)   [place]    Feeling of Stress : Only a little  Social Connections: Socially Integrated (06/10/2022)   Social Connection and Isolation Panel [NHANES]    Frequency of Communication with Friends and Family: More than three times a week    Frequency of Social Gatherings with Friends and Family: More than three times a week    Attends Religious Services: More than 4 times per year    Active Member of [employer] or Organizations: Yes    Attends Music therapist: More than 4 times per year    Marital Status: Married  Human resources officer Violence: Not At Risk (06/10/2022)   Humiliation, Afraid, Rape, and Kick questionnaire    Fear of Current or Ex-Partner: No    Emotionally Abused: No    Physically Abused: No    Sexually Abused: No    Subjective: Review of Systems  Constitutional:   Negative for chills and fever.  HENT:  Negative for congestion and hearing loss.   Eyes:  Negative for blurred vision and double vision.  Respiratory:  Negative for cough and shortness of breath.   Cardiovascular:  Negative for chest pain and palpitations.  Gastrointestinal:  Positive for constipation. Negative for abdominal pain, blood in stool, diarrhea, heartburn, melena and vomiting.  Genitourinary:  Negative for dysuria and urgency.  Musculoskeletal:  Negative for joint pain and myalgias.  Skin:  Negative for itching and rash.  Neurological:  Negative for dizziness and headaches.  Psychiatric/Behavioral:  Negative for depression. The patient is not nervous/anxious.        Objective: BP (!) 152/90 (BP Location: Left Arm, Patient Position: Sitting, Cuff Size: Large)   Pulse 60   Temp (!) 97.3 F (36.3 C) (Temporal)   Ht '5\' 5"'$  (1.651 m)   Wt 199 lb 3.2 oz (90.4 kg)   SpO2 97%   BMI 33.15 kg/m  Physical Exam Constitutional:      Appearance: Normal appearance.  HENT:     Head: Normocephalic and atraumatic.  Eyes:     Extraocular Movements: Extraocular movements intact.     Conjunctiva/sclera: Conjunctivae normal.  Cardiovascular:     Rate and Rhythm: Normal rate and regular rhythm.  Pulmonary:     Effort: Pulmonary effort is normal.     Breath sounds: Normal breath sounds.  Abdominal:     General: Bowel sounds are normal.     Palpations: Abdomen is soft.  Musculoskeletal:        General: No swelling. Normal range of motion.     Cervical back: Normal range of motion and neck supple.  Skin:    General: Skin is warm and dry.     Coloration: Skin is not jaundiced.  Neurological:     General: No focal deficit present.     Mental Status: She is alert and oriented to person, place, and time.  Psychiatric:        Mood and Affect: Mood normal.        Behavior: Behavior normal.      Assessment: *Positive Cologuard testing  *Chronic idiopathic constipation    Plan: Will schedule for screening colonoscopy.The risks including infection, bleed, or perforation as well as benefits, limitations, alternatives and imponderables have been reviewed with the patient. Questions have been answered. All parties agreeable.  Constipation well controlled on OTC stool softener. Continue to monitor.   Thank you Dr. Livia Snellen for the kind referral.   09/02/2022 11:43 AM   Disclaimer: This note was dictated with voice recognition software. Similar sounding words can inadvertently be transcribed and may not be corrected upon review.

## 2022-09-02 NOTE — Patient Instructions (Signed)
We will schedule you for colonoscopy today in regards to your recently positive Cologuard testing.  Continue on over-the-counter stool softener for your chronic constipation.  If this worsens, we can discuss further.  It was very nice meeting you today.  Dr. Abbey Chatters

## 2022-09-02 NOTE — H&P (View-Only) (Signed)
Primary Care Physician:  Claretta Fraise, MD Primary Gastroenterologist:  Dr. Abbey Chatters  Chief Complaint  Patient presents with   Colon Cancer Screening    Positive cologuard    HPI:   Bethany [name] is a 71 y.o. female who presents to the clinic today by referral from her PCP Dr. Livia Snellen for evaluation.  Patient recently underwent Cologuard testing which was positive.  States her last colonoscopy was over 10 years ago.  Denies any personal history of polyps.  Does note her sister had numerous polyps removed.  Otherwise no family history of colorectal malignancy.  Denies any melena hematochezia.  No abdominal pain.  No unintentional weight loss.  Denies any upper GI symptoms including heartburn, reflux, epigastric/chest pain, dysphagia/odynophagia.  Does have mild constipation.  Takes over-the-counter stool softener which keeps her symptoms under control.  Notes normal bowel movements regularly if she takes her stool softener.  No straining.  Past Medical History:  Diagnosis Date   Allergy 1997   knees   Arthritis    Diabetes mellitus without complication (HCC)    GERD (gastroesophageal reflux disease)    Hyperlipidemia    Hypertension    Obesity    Pneumonia    1 month ago - rsolved   Seasonal allergies    Sleep apnea    cpap    Past Surgical History:  Procedure Laterality Date   ABDOMINAL HYSTERECTOMY     BREAST SURGERY     fibroid cysts removed   GANGLION CYST EXCISION     left   phlebectomy     TOTAL KNEE ARTHROPLASTY Right 10/22/2013   Procedure: RIGHT TOTAL KNEE ARTHROPLASTY;  Surgeon: Gearlean Alf, MD;  Location: [facility];  Service: Orthopedics;  Laterality: Right;   TOTAL KNEE ARTHROPLASTY Left 06/11/2015   Procedure: LEFT TOTAL KNEE ARTHROPLASTY;  Surgeon: Gaynelle Arabian, MD;  Location: [facility];  Service: Orthopedics;  Laterality: Left;   TUBAL LIGATION      Current Outpatient Medications  Medication Sig Dispense Refill   acyclovir (ZOVIRAX) 200 MG capsule  TAKE 1 CAPSULE EVERY DAY AS NEEDED FOR FLAREUPS 90 capsule 1   aspirin EC 81 MG tablet Take 1 tablet (81 mg total) by mouth daily. Swallow whole. 90 tablet 1   augmented betamethasone dipropionate (DIPROLENE-AF) 0.05 % cream Apply topically 2 (two) times daily. At affected areas (avoid face and genitals) 50 g 1   clobetasol (TEMOVATE) 0.05 % external solution Apply topically 2 (two) times daily. To scalp 50 mL 3   diclofenac (VOLTAREN) 75 MG EC tablet TAKE 1 TABLET TWICE DAILY FOR MUSCLE AND JOINT PAIN 180 tablet 3   fluticasone (FLONASE) 50 MCG/ACT nasal spray Place 2 sprays into both nostrils as needed. 48 g 1   furosemide (LASIX) 20 MG tablet TAKE 1 TABLET (20 MG TOTAL) BY MOUTH EVERY MORNING. 90 tablet 0   meclizine (ANTIVERT) 25 MG tablet TAKE 1 TABLET THREE TIMES DAILY AS NEEDED  FOR  DIZZINESS 270 tablet 1   olmesartan (BENICAR) 40 MG tablet Take 1 tablet (40 mg total) by mouth daily. 90 tablet 3   rosuvastatin (CRESTOR) 20 MG tablet TAKE 1 TABLET EVERY DAY 90 tablet 3   VENTOLIN HFA 108 (90 Base) MCG/ACT inhaler INHALE 2 PUFFS BY MOUTH EVERY 6 HOURS AS NEEDED FOR WHEEZING OR SHORTNESS OF BREATH 18 g 3   No current facility-administered medications for this visit.    Allergies as of 09/02/2022 - Review Complete 09/02/2022  Allergen Reaction Noted  Hctz [hydrochlorothiazide]  09/06/2017    Family History  Problem Relation Age of Onset   Heart disease Mother    Early death Father        Shooting   Diabetes Sister    Arthritis Brother        knees   Heart disease Sister        tumor in the heart    Social History   Socioeconomic History   Marital status: Married    Spouse name: Juanda Crumble   Number of children: 4   Years of education: 12   Highest education level: High school graduate  Occupational History   Occupation: retired    Fish farm manager: [employer]  Tobacco Use   Smoking status: Never   Smokeless tobacco: Never  Vaping Use   Vaping Use: Never used  Substance and  Sexual Activity   Alcohol use: No   Drug use: No   Sexual activity: Not Currently    Comment: husband not able to have  sex  Other Topics Concern   Not on file  Social History Narrative   Lives home with her husband. Had 4 children, one passed away. Children live nearby, daughter lives next street over   Social Determinants of Health   Financial Resource Strain: Low Risk  (06/10/2022)   Overall Financial Resource Strain (CARDIA)    Difficulty of Paying Living Expenses: Not very hard  Food Insecurity: No Food Insecurity (06/10/2022)   Hunger Vital Sign    Worried About Running Out of Food in the Last Year: Never true    Ran Out of Food in the Last Year: Never true  Transportation Needs: No Transportation Needs (06/10/2022)   PRAPARE - Hydrologist (Medical): No    Lack of Transportation (Non-Medical): No  Physical Activity: Sufficiently Active (06/10/2022)   Exercise Vital Sign    Days of Exercise per Week: 7 days    Minutes of Exercise per Session: 30 min  Stress: No Stress Concern Present (06/10/2022)   [place]    Feeling of Stress : Only a little  Social Connections: Socially Integrated (06/10/2022)   Social Connection and Isolation Panel [NHANES]    Frequency of Communication with Friends and Family: More than three times a week    Frequency of Social Gatherings with Friends and Family: More than three times a week    Attends Religious Services: More than 4 times per year    Active Member of [employer] or Organizations: Yes    Attends Music therapist: More than 4 times per year    Marital Status: Married  Human resources officer Violence: Not At Risk (06/10/2022)   Humiliation, Afraid, Rape, and Kick questionnaire    Fear of Current or Ex-Partner: No    Emotionally Abused: No    Physically Abused: No    Sexually Abused: No    Subjective: Review of Systems  Constitutional:   Negative for chills and fever.  HENT:  Negative for congestion and hearing loss.   Eyes:  Negative for blurred vision and double vision.  Respiratory:  Negative for cough and shortness of breath.   Cardiovascular:  Negative for chest pain and palpitations.  Gastrointestinal:  Positive for constipation. Negative for abdominal pain, blood in stool, diarrhea, heartburn, melena and vomiting.  Genitourinary:  Negative for dysuria and urgency.  Musculoskeletal:  Negative for joint pain and myalgias.  Skin:  Negative for itching and rash.  Neurological:  Negative for dizziness and headaches.  Psychiatric/Behavioral:  Negative for depression. The patient is not nervous/anxious.        Objective: BP (!) 152/90 (BP Location: Left Arm, Patient Position: Sitting, Cuff Size: Large)   Pulse 60   Temp (!) 97.3 F (36.3 C) (Temporal)   Ht '5\' 5"'$  (1.651 m)   Wt 199 lb 3.2 oz (90.4 kg)   SpO2 97%   BMI 33.15 kg/m  Physical Exam Constitutional:      Appearance: Normal appearance.  HENT:     Head: Normocephalic and atraumatic.  Eyes:     Extraocular Movements: Extraocular movements intact.     Conjunctiva/sclera: Conjunctivae normal.  Cardiovascular:     Rate and Rhythm: Normal rate and regular rhythm.  Pulmonary:     Effort: Pulmonary effort is normal.     Breath sounds: Normal breath sounds.  Abdominal:     General: Bowel sounds are normal.     Palpations: Abdomen is soft.  Musculoskeletal:        General: No swelling. Normal range of motion.     Cervical back: Normal range of motion and neck supple.  Skin:    General: Skin is warm and dry.     Coloration: Skin is not jaundiced.  Neurological:     General: No focal deficit present.     Mental Status: She is alert and oriented to person, place, and time.  Psychiatric:        Mood and Affect: Mood normal.        Behavior: Behavior normal.      Assessment: *Positive Cologuard testing  *Chronic idiopathic constipation    Plan: Will schedule for screening colonoscopy.The risks including infection, bleed, or perforation as well as benefits, limitations, alternatives and imponderables have been reviewed with the patient. Questions have been answered. All parties agreeable.  Constipation well controlled on OTC stool softener. Continue to monitor.   Thank you Dr. Livia Snellen for the kind referral.   09/02/2022 11:43 AM   Disclaimer: This note was dictated with voice recognition software. Similar sounding words can inadvertently be transcribed and may not be corrected upon review.

## 2022-09-08 DIAGNOSIS — M9901 Segmental and somatic dysfunction of cervical region: Secondary | ICD-10-CM | POA: Diagnosis not present

## 2022-09-08 DIAGNOSIS — M62830 Muscle spasm of back: Secondary | ICD-10-CM | POA: Diagnosis not present

## 2022-09-08 DIAGNOSIS — M9904 Segmental and somatic dysfunction of sacral region: Secondary | ICD-10-CM | POA: Diagnosis not present

## 2022-09-08 DIAGNOSIS — M9903 Segmental and somatic dysfunction of lumbar region: Secondary | ICD-10-CM | POA: Diagnosis not present

## 2022-09-16 ENCOUNTER — Ambulatory Visit (INDEPENDENT_AMBULATORY_CARE_PROVIDER_SITE_OTHER): Payer: Medicare Other | Admitting: Family Medicine

## 2022-09-16 ENCOUNTER — Encounter: Payer: Self-pay | Admitting: Family Medicine

## 2022-09-16 VITALS — BP 134/89 | HR 80 | Temp 97.3°F | Ht 65.0 in | Wt 199.4 lb

## 2022-09-16 DIAGNOSIS — M5416 Radiculopathy, lumbar region: Secondary | ICD-10-CM

## 2022-09-16 DIAGNOSIS — M179 Osteoarthritis of knee, unspecified: Secondary | ICD-10-CM

## 2022-09-16 DIAGNOSIS — Z6837 Body mass index (BMI) 37.0-37.9, adult: Secondary | ICD-10-CM

## 2022-09-16 DIAGNOSIS — I10 Essential (primary) hypertension: Secondary | ICD-10-CM | POA: Diagnosis not present

## 2022-09-16 DIAGNOSIS — E782 Mixed hyperlipidemia: Secondary | ICD-10-CM

## 2022-09-16 DIAGNOSIS — Z23 Encounter for immunization: Secondary | ICD-10-CM

## 2022-09-16 DIAGNOSIS — R7303 Prediabetes: Secondary | ICD-10-CM

## 2022-09-16 LAB — BAYER DCA HB A1C WAIVED: HB A1C (BAYER DCA - WAIVED): 5.3 % (ref 4.8–5.6)

## 2022-09-16 MED ORDER — FUROSEMIDE 20 MG PO TABS: 20.0000 mg | ORAL_TABLET | ORAL | 3 refills | Status: DC

## 2022-09-16 MED ORDER — PREDNISONE 20 MG PO TABS: ORAL_TABLET | ORAL | 0 refills | Status: DC

## 2022-09-16 NOTE — Progress Notes (Signed)
Subjective:  Patient ID: Bethany [name], female    DOB: 1951/09/18  Age: 71 y.o. MRN: [id number]  CC: Medical Management of Chronic Issues   HPI Dessire Grimes presents for  follow-up of hypertension. Patient has no history of headache chest pain or shortness of breath or recent cough. Patient also denies symptoms of TIA such as focal numbness or weakness. Patient denies side effects from benicar. States taking it regularly.   in for follow-up of elevated cholesterol. Doing well without complaints on current medication. Denies side effects of statin including myalgia and arthralgia and nausea. Currently no chest pain, shortness of breath or other cardiovascular related symptoms noted.  Pain in left leg still at 7/10 starts at buttocks and goes to foot. No relief with chiropractic twice a week. Also taking diclofenac and using essential oil rub down and massage with no relief. Chiropractor did ls spine XR that demonstrated spondylosis   History Avi has a past medical history of Allergy (1997), Arthritis, Diabetes mellitus without complication ([place]), GERD (gastroesophageal reflux disease), Hyperlipidemia, Hypertension, Obesity, Pneumonia, Seasonal allergies, and Sleep apnea.   She has a past surgical history that includes Abdominal hysterectomy; Tubal ligation; phlebectomy; Total knee arthroplasty (Right, 10/22/2013); Ganglion cyst excision; Breast surgery; and Total knee arthroplasty (Left, 06/11/2015).   Her family history includes Arthritis in her brother; Diabetes in her sister; Early death in her father; Heart disease in her mother and sister.She reports that she has never smoked. She has never used smokeless tobacco. She reports that she does not drink alcohol and does not use drugs.  Current Outpatient Medications on File Prior to Visit  Medication Sig Dispense Refill   acyclovir (ZOVIRAX) 200 MG capsule TAKE 1 CAPSULE EVERY DAY AS NEEDED FOR FLAREUPS 90 capsule 1   aspirin EC 81 MG  tablet Take 1 tablet (81 mg total) by mouth daily. Swallow whole. 90 tablet 1   augmented betamethasone dipropionate (DIPROLENE-AF) 0.05 % cream Apply topically 2 (two) times daily. At affected areas (avoid face and genitals) 50 g 1   clobetasol (TEMOVATE) 0.05 % external solution Apply topically 2 (two) times daily. To scalp 50 mL 3   diclofenac (VOLTAREN) 75 MG EC tablet TAKE 1 TABLET TWICE DAILY FOR MUSCLE AND JOINT PAIN 180 tablet 3   fluticasone (FLONASE) 50 MCG/ACT nasal spray Place 2 sprays into both nostrils as needed. 48 g 1   furosemide (LASIX) 20 MG tablet TAKE 1 TABLET (20 MG TOTAL) BY MOUTH EVERY MORNING. 90 tablet 0   meclizine (ANTIVERT) 25 MG tablet TAKE 1 TABLET THREE TIMES DAILY AS NEEDED  FOR  DIZZINESS 270 tablet 1   olmesartan (BENICAR) 40 MG tablet Take 1 tablet (40 mg total) by mouth daily. 90 tablet 3   rosuvastatin (CRESTOR) 20 MG tablet TAKE 1 TABLET EVERY DAY 90 tablet 3   VENTOLIN HFA 108 (90 Base) MCG/ACT inhaler INHALE 2 PUFFS BY MOUTH EVERY 6 HOURS AS NEEDED FOR WHEEZING OR SHORTNESS OF BREATH 18 g 3   No current facility-administered medications on file prior to visit.    ROS Review of Systems  Constitutional: Negative.   HENT: Negative.    Eyes:  Negative for visual disturbance.  Respiratory:  Negative for shortness of breath.   Cardiovascular:  Negative for chest pain.  Gastrointestinal:  Negative for abdominal pain.  Musculoskeletal:  Positive for arthralgias (LLE - see HPI).    Objective:  BP 134/89   Pulse 80   Temp (!) 97.3 F (36.3 C)  Ht 5' 5"  (1.651 m)   Wt 199 lb 6.4 oz (90.4 kg)   SpO2 97%   BMI 33.18 kg/m   BP Readings from Last 3 Encounters:  09/16/22 134/89  09/02/22 (!) 152/90  08/10/22 (!) 133/91    Wt Readings from Last 3 Encounters:  09/16/22 199 lb 6.4 oz (90.4 kg)  09/02/22 199 lb 3.2 oz (90.4 kg)  08/10/22 198 lb 6.4 oz (90 kg)     Physical Exam Constitutional:      General: She is not in acute distress.     Appearance: She is well-developed.  Cardiovascular:     Rate and Rhythm: Normal rate and regular rhythm.  Pulmonary:     Breath sounds: Normal breath sounds.  Musculoskeletal:        General: Normal range of motion.  Skin:    General: Skin is warm and dry.  Neurological:     Mental Status: She is alert and oriented to person, place, and time.       Assessment & Plan:   Abbigaile was seen today for medical management of chronic issues.  Diagnoses and all orders for this visit:  Mixed hyperlipidemia -     Lipid panel  Primary hypertension -     CBC with Differential/Platelet -     CMP14+EGFR  Prediabetes -     Bayer DCA Hb A1c Waived   Allergies as of 09/16/2022       Reactions   Hctz [hydrochlorothiazide]    Hair loss        Medication List        Accurate as of September 16, 2022  8:52 AM. If you have any questions, ask your nurse or doctor.          acyclovir 200 MG capsule Commonly known as: ZOVIRAX TAKE 1 CAPSULE EVERY DAY AS NEEDED FOR FLAREUPS   aspirin EC 81 MG tablet Take 1 tablet (81 mg total) by mouth daily. Swallow whole.   augmented betamethasone dipropionate 0.05 % cream Commonly known as: DIPROLENE-AF Apply topically 2 (two) times daily. At affected areas (avoid face and genitals)   clobetasol 0.05 % external solution Commonly known as: TEMOVATE Apply topically 2 (two) times daily. To scalp   diclofenac 75 MG EC tablet Commonly known as: VOLTAREN TAKE 1 TABLET TWICE DAILY FOR MUSCLE AND JOINT PAIN   fluticasone 50 MCG/ACT nasal spray Commonly known as: FLONASE Place 2 sprays into both nostrils as needed.   furosemide 20 MG tablet Commonly known as: LASIX TAKE 1 TABLET (20 MG TOTAL) BY MOUTH EVERY MORNING.   meclizine 25 MG tablet Commonly known as: ANTIVERT TAKE 1 TABLET THREE TIMES DAILY AS NEEDED  FOR  DIZZINESS   olmesartan 40 MG tablet Commonly known as: BENICAR Take 1 tablet (40 mg total) by mouth daily.    rosuvastatin 20 MG tablet Commonly known as: CRESTOR TAKE 1 TABLET EVERY DAY   Ventolin HFA 108 (90 Base) MCG/ACT inhaler Generic drug: albuterol INHALE 2 PUFFS BY MOUTH EVERY 6 HOURS AS NEEDED FOR WHEEZING OR SHORTNESS OF BREATH        No orders of the defined types were placed in this encounter.   DASH handout - salt avoidance recommended due to elevated BP.  Follow-up: No follow-ups on file.  Claretta Fraise, M.D.

## 2022-09-16 NOTE — Patient Instructions (Signed)

## 2022-09-17 LAB — CBC WITH DIFFERENTIAL/PLATELET
Basophils Absolute: 0.1 10*3/uL (ref 0.0–0.2)
Basos: 1 %
EOS (ABSOLUTE): 0.1 10*3/uL (ref 0.0–0.4)
Eos: 1 %
Hematocrit: 41 % (ref 34.0–46.6)
Hemoglobin: 13.7 g/dL (ref 11.1–15.9)
Immature Grans (Abs): 0 10*3/uL (ref 0.0–0.1)
Immature Granulocytes: 0 %
Lymphocytes Absolute: 1.9 10*3/uL (ref 0.7–3.1)
Lymphs: 25 %
MCH: 29.6 pg (ref 26.6–33.0)
MCHC: 33.4 g/dL (ref 31.5–35.7)
MCV: 89 fL (ref 79–97)
Monocytes Absolute: 0.6 10*3/uL (ref 0.1–0.9)
Monocytes: 8 %
Neutrophils Absolute: 4.9 10*3/uL (ref 1.4–7.0)
Neutrophils: 65 %
Platelets: 278 10*3/uL (ref 150–450)
RBC: 4.63 x10E6/uL (ref 3.77–5.28)
RDW: 13.7 % (ref 11.7–15.4)
WBC: 7.6 10*3/uL (ref 3.4–10.8)

## 2022-09-17 LAB — CMP14+EGFR
ALT: 15 IU/L (ref 0–32)
AST: 20 IU/L (ref 0–40)
Albumin/Globulin Ratio: 1.7 (ref 1.2–2.2)
Albumin: 4.4 g/dL (ref 3.8–4.8)
Alkaline Phosphatase: 79 IU/L (ref 44–121)
BUN/Creatinine Ratio: 16 (ref 12–28)
BUN: 15 mg/dL (ref 8–27)
Bilirubin Total: 0.7 mg/dL (ref 0.0–1.2)
CO2: 25 mmol/L (ref 20–29)
Calcium: 10.3 mg/dL (ref 8.7–10.3)
Chloride: 106 mmol/L (ref 96–106)
Creatinine, Ser: 0.91 mg/dL (ref 0.57–1.00)
Globulin, Total: 2.6 g/dL (ref 1.5–4.5)
Glucose: 103 mg/dL — ABNORMAL HIGH (ref 70–99)
Potassium: 3.6 mmol/L (ref 3.5–5.2)
Sodium: 143 mmol/L (ref 134–144)
Total Protein: 7 g/dL (ref 6.0–8.5)
eGFR: 67 mL/min/{1.73_m2} (ref >59)

## 2022-09-17 LAB — LIPID PANEL
Chol/HDL Ratio: 2.8 ratio (ref 0.0–4.4)
Cholesterol, Total: 148 mg/dL (ref 100–199)
HDL: 52 mg/dL (ref >39)
LDL Chol Calc (NIH): 82 mg/dL (ref 0–99)
Triglycerides: 68 mg/dL (ref 0–149)
VLDL Cholesterol Cal: 14 mg/dL (ref 5–40)

## 2022-09-21 NOTE — Progress Notes (Signed)
Hello Kelcie,  Your lab result is normal and/or stable.Some minor variations that are not significant are commonly marked abnormal, but do not represent any medical problem for you.  Best regards, Traniya Prichett, M.D.

## 2022-09-22 DIAGNOSIS — M9903 Segmental and somatic dysfunction of lumbar region: Secondary | ICD-10-CM | POA: Diagnosis not present

## 2022-09-22 DIAGNOSIS — M9904 Segmental and somatic dysfunction of sacral region: Secondary | ICD-10-CM | POA: Diagnosis not present

## 2022-09-22 DIAGNOSIS — M62830 Muscle spasm of back: Secondary | ICD-10-CM | POA: Diagnosis not present

## 2022-09-22 DIAGNOSIS — M9901 Segmental and somatic dysfunction of cervical region: Secondary | ICD-10-CM | POA: Diagnosis not present

## 2022-09-27 ENCOUNTER — Ambulatory Visit: Payer: Medicare Other

## 2022-09-28 ENCOUNTER — Encounter: Payer: Self-pay | Admitting: Physical Therapy

## 2022-09-28 ENCOUNTER — Other Ambulatory Visit: Payer: Self-pay

## 2022-09-28 ENCOUNTER — Ambulatory Visit: Payer: Medicare Other | Attending: Family Medicine | Admitting: Physical Therapy

## 2022-09-28 DIAGNOSIS — M5459 Other low back pain: Secondary | ICD-10-CM | POA: Diagnosis not present

## 2022-09-28 DIAGNOSIS — M6281 Muscle weakness (generalized): Secondary | ICD-10-CM | POA: Insufficient documentation

## 2022-09-28 DIAGNOSIS — R293 Abnormal posture: Secondary | ICD-10-CM | POA: Diagnosis not present

## 2022-09-28 DIAGNOSIS — M79676 Pain in unspecified toe(s): Secondary | ICD-10-CM | POA: Diagnosis not present

## 2022-09-28 DIAGNOSIS — B351 Tinea unguium: Secondary | ICD-10-CM | POA: Diagnosis not present

## 2022-09-28 DIAGNOSIS — L84 Corns and callosities: Secondary | ICD-10-CM | POA: Diagnosis not present

## 2022-09-28 DIAGNOSIS — E1142 Type 2 diabetes mellitus with diabetic polyneuropathy: Secondary | ICD-10-CM | POA: Diagnosis not present

## 2022-09-28 NOTE — Therapy (Signed)
OUTPATIENT PHYSICAL THERAPY THORACOLUMBAR EVALUATION   Patient Name: Bethany [name] MRN: [id number] DOB:1951/07/13, 71 y.o., female Today's Date: 09/28/2022   PT End of Session - 09/28/22 1502     Visit Number 1    Number of Visits 12    Date for PT Re-Evaluation 12/27/22    Authorization Type FOTO AT LEAST EVERY 5TH VISIT.  PROGRESS NOTE AT 10TH VISIT.  KX MODIFIER AFTER 15 VISITS.    PT Start Time 0230    PT Stop Time 0316    PT Time Calculation (min) 46 min    Activity Tolerance Patient tolerated treatment well    Behavior During Therapy WFL for tasks assessed/performed             Past Medical History:  Diagnosis Date   Allergy 1997   knees   Arthritis    Diabetes mellitus without complication (H[facility])    GERD (gastroesophageal reflux disease)    Hyperlipidemia    Hypertension    Obesity    Pneumonia    1 month ago - rsolved   Prediabetes 08/04/2018   Seasonal allergies    Sleep apnea    cpap   Past Surgical History:  Procedure Laterality Date   ABDOMINAL HYSTERECTOMY     BREAST SURGERY     fibroid cysts removed   GANGLION CYST EXCISION     left   phlebectomy     TOTAL KNEE ARTHROPLASTY Right 10/22/2013   Procedure: RIGHT TOTAL KNEE ARTHROPLASTY;  Surgeon: Gearlean Alf, MD;  Location: [facility];  Service: Orthopedics;  Laterality: Right;   TOTAL KNEE ARTHROPLASTY Left 06/11/2015   Procedure: LEFT TOTAL KNEE ARTHROPLASTY;  Surgeon: Gaynelle Arabian, MD;  Location: [facility];  Service: Orthopedics;  Laterality: Left;   TUBAL LIGATION     Patient Active Problem List   Diagnosis Date Noted   Positive colorectal cancer screening using Cologuard test 08/10/2022   Acute foot pain, left 08/21/2021   History of arthroplasty of left knee 01/06/2021   Nerve palsy 12/16/2020   OSA (obstructive sleep apnea) 03/25/2014   OA (osteoarthritis) of knee 10/22/2013   Obesity, unspecified 07/02/2013   Arthritis 07/02/2013   Mouth dryness 07/02/2013   Benign paroxysmal positional  vertigo 07/02/2013   Vitamin D deficiency 07/02/2013   Hypertension 05/04/2013   Hyperlipidemia 05/04/2013    REFERRING PROVIDER: Claretta Fraise MD  REFERRING DIAG: Lumbar radiculopathy  Rationale for Evaluation and Treatment Rehabilitation  THERAPY DIAG:  Other low back pain - Plan: PT plan of care cert/re-cert  Abnormal posture - Plan: PT plan of care cert/re-cert  Muscle weakness (generalized) - Plan: PT plan of care cert/re-cert  ONSET DATE: ~8 weeks.  SUBJECTIVE:  SUBJECTIVE STATEMENT: The patient presents to the clinic with a great deal of pain c/o left sided low back pain radiating into her buttock, lateral thigh and calf.  She feels this may have all started after twisting her left ankle about 8 weeks ago.  She has tried Chiropractic care but is has not helped.  Her pain is rated at an 8/10.  Lying down and rest can decrease her pain somewhat.  Walking, standing and sitting increase her pain.  Transfer are very difficult as well.  She is scheduled for an MRI on 10/11/22.  She saw a Podiatrist for the left foot/ankle injury and it was suspected she may have had a ganglion cyst per patient report. PERTINENT HISTORY:  DM, bilateral TKA's.  PAIN:  Are you having pain? Yes: NPRS scale: 8/10 Pain location: Left low back and left LE Pain description: Ache, sore, throbbing, sharp, numb, shooting. Aggravating factors: As above. Relieving factors: as above.   PRECAUTIONS: None  WEIGHT BEARING RESTRICTIONS No  FALLS:  Has patient fallen in last 6 months? No  LIVING ENVIRONMENT: Lives with: lives with their spouse Lives in: House/apartment Has following equipment at home: Quad cane large base  OCCUPATION: Retired.  PLOF: Independent  PATIENT GOALS:  Not have this pain.   OBJECTIVE:    PATIENT SURVEYS:  FOTO Complete.     POSTURE: Patient stands in a flexed trunk posture un weighting her left LE  PALPATION: Tender over left SIJ, increase tone and tenderness in left Piriformis region and very hypersensitive to even light touch along her left ITB and lateral calf.  LUMBAR ROM:   Active lumbar flexion decreased by 50% and extension to 10 degrees.  LOWER EXTREMITY MMT:     Left ankle dorsiflexion, knee flex and ext decreased to 4-/5 limited at least in part due to pain.  Left hip strength is 4/5.  Shaky muscle response during manual muscle testing.  LUMBAR SPECIAL TESTS:  Straight leg raise test: Positive and FABER test: Positive.  Equal leg lengths.  Normal Patellar reflexes and diminished Achilles reflexes.  TRANSFERS:  Patient lifted her left LE with her hands to transition from sit to supine.  GAIT: Very antalgic, with a significant decrease in stance time over her left LE using a QC.    TODAY'S TREATMENT  Right sdly position with folded pillow between knees for comfort:  IFC at 80-150 Hz on 40% scan x 20 minutes to left SIJ, Piriformis region and mid-ITB region.  Patient enjoyed treatment with normal modality response following removal of modality.  ASSESSMENT:  CLINICAL IMPRESSION: The patient presents to [facility] with c/o left-sided low back pain with radiation into her left buttock, lateral thigh and calf.  This has been ongoing and worsening over approximately the last 8 weeks.  She stands in trunk flexion and un weights her left LE.  She was tender over her left SIJ, Piriformis region and is very hypersensitive to touch over her left ITB and lateral calf.  Her gait is very antalgic in nature and transitory movement are done very slowly and painfully and she requires use of her UE's to move her left LE.  Her left LE is weak.  Patient will benefit from skilled physical therapy intervention to address pain and deficits.  OBJECTIVE IMPAIRMENTS Abnormal gait,  decreased activity tolerance, decreased mobility, difficulty walking, decreased ROM, increased muscle spasms, postural dysfunction, and pain.   ACTIVITY LIMITATIONS carrying, lifting, bending, sitting, standing, transfers, bed mobility, and locomotion level  PARTICIPATION  LIMITATIONS: meal prep, cleaning, and laundry  REHAB POTENTIAL: Good  CLINICAL DECISION MAKING: Stable/uncomplicated  EVALUATION COMPLEXITY: Low   GOALS: Goals reviewed with patient? Yes  SHORT TERM GOALS: Target date: 10/12/2022  Ind with a initial HEP. Baseline: Goal status: INITIAL  LONG TERM GOALS: Target date: 11/09/2022  Ind with an advanced HEP. Baseline:  Goal status: INITIAL  2.  Eliminate left LE pain and symptoms. Baseline:  Goal status: INITIAL  3.  Walk without antalgia. Baseline:  Goal status: INITIAL  4.  Perform ADL's with pain not > 3-4/10 Baseline:  Goal status: INITIAL     PLAN: PT FREQUENCY: 2x/week  PT DURATION: 6 weeks  PLANNED INTERVENTIONS: Therapeutic exercises, Therapeutic activity, Neuromuscular re-education, Gait training, Patient/Family education, Self Care, Dry Needling, Electrical stimulation, Cryotherapy, Moist heat, Ultrasound, and Manual therapy.  PLAN FOR NEXT SESSION: STW/M to left SIJ, Piriformis region and ITB, SKTC, Nustep.  Core exercise progression.   [place]jan [place], [place], PT 09/28/2022, 5:36 PM

## 2022-09-29 ENCOUNTER — Encounter: Payer: Self-pay | Admitting: *Deleted

## 2022-09-29 ENCOUNTER — Ambulatory Visit: Payer: Medicare Other | Admitting: *Deleted

## 2022-09-29 DIAGNOSIS — M5459 Other low back pain: Secondary | ICD-10-CM

## 2022-09-29 DIAGNOSIS — M6281 Muscle weakness (generalized): Secondary | ICD-10-CM | POA: Diagnosis not present

## 2022-09-29 DIAGNOSIS — R293 Abnormal posture: Secondary | ICD-10-CM

## 2022-09-29 NOTE — Therapy (Signed)
OUTPATIENT PHYSICAL THERAPY THORACOLUMBAR TREATMENT   Patient Name: Bethany [name] MRN: [id number] DOB:1951/04/26, 71 y.o., female Today's Date: 09/29/2022   PT End of Session - 09/29/22 0947     Visit Number 2    Number of Visits 12    Date for PT Re-Evaluation 12/27/22    Authorization Type FOTO AT LEAST EVERY 5TH VISIT.  PROGRESS NOTE AT 10TH VISIT.  KX MODIFIER AFTER 15 VISITS.    PT Start Time 0945    PT Stop Time 1035    PT Time Calculation (min) 50 min             Past Medical History:  Diagnosis Date   Allergy 1997   knees   Arthritis    Diabetes mellitus without complication ([facility])    GERD (gastroesophageal reflux disease)    Hyperlipidemia    Hypertension    Obesity    Pneumonia    1 month ago - rsolved   Prediabetes 08/04/2018   Seasonal allergies    Sleep apnea    cpap   Past Surgical History:  Procedure Laterality Date   ABDOMINAL HYSTERECTOMY     BREAST SURGERY     fibroid cysts removed   GANGLION CYST EXCISION     left   phlebectomy     TOTAL KNEE ARTHROPLASTY Right 10/22/2013   Procedure: RIGHT TOTAL KNEE ARTHROPLASTY;  Surgeon: Gearlean Alf, MD;  Location: [facility];  Service: Orthopedics;  Laterality: Right;   TOTAL KNEE ARTHROPLASTY Left 06/11/2015   Procedure: LEFT TOTAL KNEE ARTHROPLASTY;  Surgeon: Gaynelle Arabian, MD;  Location: [facility];  Service: Orthopedics;  Laterality: Left;   TUBAL LIGATION     Patient Active Problem List   Diagnosis Date Noted   Positive colorectal cancer screening using Cologuard test 08/10/2022   Acute foot pain, left 08/21/2021   History of arthroplasty of left knee 01/06/2021   Nerve palsy 12/16/2020   OSA (obstructive sleep apnea) 03/25/2014   OA (osteoarthritis) of knee 10/22/2013   Obesity, unspecified 07/02/2013   Arthritis 07/02/2013   Mouth dryness 07/02/2013   Benign paroxysmal positional vertigo 07/02/2013   Vitamin D deficiency 07/02/2013   Hypertension 05/04/2013   Hyperlipidemia 05/04/2013     REFERRING PROVIDER: Claretta Fraise MD  REFERRING DIAG: Lumbar radiculopathy  Rationale for Evaluation and Treatment Rehabilitation  THERAPY DIAG:  Other low back pain  Abnormal posture  Muscle weakness (generalized)  ONSET DATE: ~8 weeks.  SUBJECTIVE:  SUBJECTIVE STATEMENT:  Pt reports doing about the same with Pain down LT leg.  PERTINENT HISTORY:  DM, bilateral TKA's.  PAIN:  Are you having pain? Yes: NPRS scale: 8/10 Pain location: Left low back and left LE Pain description: Ache, sore, throbbing, sharp, numb, shooting. Aggravating factors: As above. Relieving factors: as above.   PRECAUTIONS: None  WEIGHT BEARING RESTRICTIONS No  FALLS:  Has patient fallen in last 6 months? No  LIVING ENVIRONMENT: Lives with: lives with their spouse Lives in: House/apartment Has following equipment at home: Quad cane large base  OCCUPATION: Retired.  PLOF: Independent  PATIENT GOALS:  Not have this pain.   OBJECTIVE:    TODAY'S TREATMENT     09-29-22 Manual: STW to LT LB paras, SIJ, and upper glute and piriformis with Pt RT side lying [place] combo x10 mins 1.5 w/cm2 to LT glute  Right sdly position with folded pillow between knees for comfort: HMP and IFC at 80-150 Hz on 40% scan x 20 minutes to left SIJ, Piriformis region and mid-ITB region.   ASSESSMENT:  CLINICAL IMPRESSION: Pt arrived today doing about the same with pain radiating into LT  LE. She was very tender  in LT SIJ, upper glute and piriformis area. STW performed to these areas as well as [place] combo all with Pt in RT side lying. Pt felt good end of session and reported decreased pain after standing and walking.          OBJECTIVE IMPAIRMENTS Abnormal gait, decreased activity tolerance, decreased mobility, difficulty  walking, decreased ROM, increased muscle spasms, postural dysfunction, and pain.   ACTIVITY LIMITATIONS carrying, lifting, bending, sitting, standing, transfers, bed mobility, and locomotion level  PARTICIPATION LIMITATIONS: meal prep, cleaning, and laundry  REHAB POTENTIAL: Good  CLINICAL DECISION MAKING: Stable/uncomplicated  EVALUATION COMPLEXITY: Low   GOALS: Goals reviewed with patient? Yes  SHORT TERM GOALS: Target date: 10/13/2022  Ind with a initial HEP. Baseline: Goal status: INITIAL  LONG TERM GOALS: Target date: 11/10/2022  Ind with an advanced HEP. Baseline:  Goal status: INITIAL  2.  Eliminate left LE pain and symptoms. Baseline:  Goal status: INITIAL  3.  Walk without antalgia. Baseline:  Goal status: INITIAL  4.  Perform ADL's with pain not > 3-4/10 Baseline:  Goal status: INITIAL     PLAN: PT FREQUENCY: 2x/week  PT DURATION: 6 weeks  PLANNED INTERVENTIONS: Therapeutic exercises, Therapeutic activity, Neuromuscular re-education, Gait training, Patient/Family education, Self Care, Dry Needling, Electrical stimulation, Cryotherapy, Moist heat, Ultrasound, and Manual therapy.  PLAN FOR NEXT SESSION: STW/M to left SIJ, Piriformis region and ITB, SKTC, Nustep.  Core exercise progression.   Tiara Maultsby,CHRIS, PTA 09/29/2022, 1:17 PM

## 2022-09-30 ENCOUNTER — Encounter (HOSPITAL_COMMUNITY)
Admission: RE | Disposition: A | Discharge status: Home / Self Care | Payer: Self-pay | Source: Home / Self Care | Attending: Internal Medicine

## 2022-09-30 ENCOUNTER — Ambulatory Visit (HOSPITAL_COMMUNITY)
Admission: RE | Admit: 2022-09-30 | Discharge: 2022-09-30 | Disposition: A | Discharge status: Home / Self Care | Payer: Medicare Other | Attending: Internal Medicine | Admitting: Internal Medicine

## 2022-09-30 ENCOUNTER — Ambulatory Visit (HOSPITAL_COMMUNITY): Payer: Medicare Other | Admitting: Anesthesiology

## 2022-09-30 ENCOUNTER — Ambulatory Visit (HOSPITAL_BASED_OUTPATIENT_CLINIC_OR_DEPARTMENT_OTHER): Payer: Medicare Other | Admitting: Anesthesiology

## 2022-09-30 ENCOUNTER — Other Ambulatory Visit: Payer: Self-pay

## 2022-09-30 ENCOUNTER — Encounter (HOSPITAL_COMMUNITY): Payer: Self-pay

## 2022-09-30 DIAGNOSIS — G4730 Sleep apnea, unspecified: Secondary | ICD-10-CM | POA: Diagnosis not present

## 2022-09-30 DIAGNOSIS — K635 Polyp of colon: Secondary | ICD-10-CM | POA: Diagnosis not present

## 2022-09-30 DIAGNOSIS — R197 Diarrhea, unspecified: Secondary | ICD-10-CM

## 2022-09-30 DIAGNOSIS — Z1212 Encounter for screening for malignant neoplasm of rectum: Secondary | ICD-10-CM

## 2022-09-30 DIAGNOSIS — K648 Other hemorrhoids: Secondary | ICD-10-CM | POA: Diagnosis not present

## 2022-09-30 DIAGNOSIS — Z1211 Encounter for screening for malignant neoplasm of colon: Secondary | ICD-10-CM

## 2022-09-30 DIAGNOSIS — I10 Essential (primary) hypertension: Secondary | ICD-10-CM | POA: Diagnosis not present

## 2022-09-30 DIAGNOSIS — K219 Gastro-esophageal reflux disease without esophagitis: Secondary | ICD-10-CM | POA: Diagnosis not present

## 2022-09-30 DIAGNOSIS — K5904 Chronic idiopathic constipation: Secondary | ICD-10-CM | POA: Insufficient documentation

## 2022-09-30 DIAGNOSIS — R195 Other fecal abnormalities: Secondary | ICD-10-CM | POA: Insufficient documentation

## 2022-09-30 DIAGNOSIS — D122 Benign neoplasm of ascending colon: Secondary | ICD-10-CM | POA: Insufficient documentation

## 2022-09-30 DIAGNOSIS — M1990 Unspecified osteoarthritis, unspecified site: Secondary | ICD-10-CM | POA: Diagnosis not present

## 2022-09-30 DIAGNOSIS — E119 Type 2 diabetes mellitus without complications: Secondary | ICD-10-CM | POA: Diagnosis not present

## 2022-09-30 HISTORY — DX: Mononeuropathy, unspecified: G58.9

## 2022-09-30 HISTORY — DX: Other symptoms and signs involving the musculoskeletal system: R29.898

## 2022-09-30 HISTORY — PX: POLYPECTOMY: SHX5525

## 2022-09-30 HISTORY — PX: COLONOSCOPY WITH PROPOFOL: SHX5780

## 2022-09-30 SURGERY — COLONOSCOPY WITH PROPOFOL
Anesthesia: General

## 2022-09-30 MED ORDER — PROPOFOL 500 MG/50ML IV EMUL
INTRAVENOUS | Status: DC | PRN
  Administered 2022-09-30: 150 ug/kg/min via INTRAVENOUS

## 2022-09-30 MED ORDER — LIDOCAINE HCL 1 % IJ SOLN
INTRAMUSCULAR | Status: DC | PRN
  Administered 2022-09-30: 50 mg via INTRADERMAL

## 2022-09-30 MED ORDER — PROPOFOL 10 MG/ML IV BOLUS
INTRAVENOUS | Status: DC | PRN
  Administered 2022-09-30: 60 mg via INTRAVENOUS
  Administered 2022-09-30: 40 mg via INTRAVENOUS

## 2022-09-30 MED ORDER — LACTATED RINGERS IV SOLN: INTRAVENOUS | Status: DC

## 2022-09-30 MED ORDER — PROPOFOL 500 MG/50ML IV EMUL
INTRAVENOUS | Status: AC
  Filled 2022-09-30: qty 100

## 2022-09-30 NOTE — Anesthesia Preprocedure Evaluation (Addendum)
Anesthesia Evaluation  Patient identified by MRN, date of birth, ID band Patient awake    Reviewed: Allergy & Precautions, NPO status , Patient's Chart, lab work & pertinent test results  Airway Mallampati: II  TM Distance: >3 FB Neck ROM: Full    Dental  (+) Dental Advisory Given, Teeth Intact   Pulmonary sleep apnea , pneumonia,    Pulmonary exam normal breath sounds clear to auscultation       Cardiovascular hypertension, Pt. on medications Normal cardiovascular exam Rhythm:Regular Rate:Normal     Neuro/Psych  Neuromuscular disease negative psych ROS   GI/Hepatic Neg liver ROS, GERD  Controlled,  Endo/Other  negative endocrine ROS  Renal/GU negative Renal ROS  negative genitourinary   Musculoskeletal  (+) Arthritis , Osteoarthritis,    Abdominal   Peds negative pediatric ROS (+)  Hematology negative hematology ROS (+)   Anesthesia Other Findings BPPV  Reproductive/Obstetrics negative OB ROS                           Anesthesia Physical Anesthesia Plan  ASA: 2  Anesthesia Plan: General   Post-op Pain Management: Minimal or no pain anticipated   Induction: Intravenous  PONV Risk Score and Plan: Propofol infusion  Airway Management Planned: Nasal Cannula and Natural Airway  Additional Equipment:   Intra-op Plan:   Post-operative Plan:   Informed Consent: I have reviewed the patients History and Physical, chart, labs and discussed the procedure including the risks, benefits and alternatives for the proposed anesthesia with the patient or authorized representative who has indicated his/her understanding and acceptance.     Dental advisory given  Plan Discussed with: CRNA and Surgeon  Anesthesia Plan Comments:        Anesthesia Quick Evaluation

## 2022-09-30 NOTE — Interval H&P Note (Signed)
History and Physical Interval Note:  09/30/2022 7:45 AM  Bethany [name]  has presented today for surgery, with the diagnosis of positive cologuard.  The various methods of treatment have been discussed with the patient and family. After consideration of risks, benefits and other options for treatment, the patient has consented to  Procedure(s) with comments: COLONOSCOPY WITH PROPOFOL (N/A) - 8:15am, asa 2 as a surgical intervention.  The patient's history has been reviewed, patient examined, no change in status, stable for surgery.  I have reviewed the patient's chart and labs.  Questions were answered to the patient's satisfaction.     [name]

## 2022-09-30 NOTE — Op Note (Signed)
[facility] Patient Name: Bethany [name] Procedure Date: 09/30/2022 8:19 AM MRN: [id number] Date of Birth: 03-07-1951 Attending MD: Elon Alas. Abbey Chatters DO CSN: [id number] Age: 71 Admit Type: Outpatient Procedure:                Colonoscopy Indications:              Screening for colorectal malignant neoplasm,                            Incidental - Positive Cologuard test Providers:                Elon Alas. Abbey Chatters, DO, Janeece Riggers, RN, Ladoris Gene                            Technician, Technician, Suzan Garibaldi. Risa Grill,                            Technician Referring MD:             Elon Alas. Abbey Chatters, DO Medicines:                See the Anesthesia note for documentation of the                            administered medications Complications:            No immediate complications. Estimated Blood Loss:     Estimated blood loss was minimal. Procedure:                Pre-Anesthesia Assessment:                           - The anesthesia plan was to use monitored                            anesthesia care (MAC).                           After obtaining informed consent, the colonoscope                            was passed under direct vision. Throughout the                            procedure, the patient's blood pressure, pulse, and                            oxygen saturations were monitored continuously. The                            PCF-HQ190L ([id number]) scope was introduced through                            the anus and advanced to the the cecum, identified                            by appendiceal orifice and ileocecal  valve. The                            colonoscopy was performed without difficulty. The                            patient tolerated the procedure well. The quality                            of the bowel preparation was evaluated using the                            BBPS [facility] Bowel Preparation Scale) with scores                            of: Right  Colon = 3, Transverse Colon = 3 and Left                            Colon = 3 (entire mucosa seen well with no residual                            staining, small fragments of stool or opaque                            liquid). The total BBPS score equals 9. Scope In: 8:32:12 AM Scope Out: 8:43:35 AM Scope Withdrawal Time: 0 hours 8 minutes 46 seconds  Total Procedure Duration: 0 hours 11 minutes 23 seconds  Findings:      The perianal and digital rectal examinations were normal.      Non-bleeding internal hemorrhoids were found during endoscopy.      A 6 mm polyp was found in the ascending colon. The polyp was flat. The       polyp was removed with a cold snare. Resection and retrieval were       complete.      The exam was otherwise without abnormality. Impression:               - Non-bleeding internal hemorrhoids.                           - One 6 mm polyp in the ascending colon, removed                            with a cold snare. Resected and retrieved.                           - The examination was otherwise normal. Moderate Sedation:      Per Anesthesia Care Recommendation:           - Patient has a contact number available for                            emergencies. The signs and symptoms of potential  delayed complications were discussed with the                            patient. Return to normal activities tomorrow.                            Written discharge instructions were provided to the                            patient.                           - Resume previous diet.                           - Continue present medications.                           - Await pathology results.                           - Repeat colonoscopy in 5 years for surveillance.                           - Return to GI clinic PRN. Procedure Code(s):        --- Professional ---                           [phone], Colonoscopy, flexible; with removal of                             tumor(s), polyp(s), or other lesion(s) by snare                            technique Diagnosis Code(s):        --- Professional ---                           Z12.11, Encounter for screening for malignant                            neoplasm of colon                           K63.5, Polyp of colon                           K64.8, Other hemorrhoids CPT copyright 2019 [employer]. All rights reserved. The codes documented in this report are preliminary and upon coder review may  be revised to meet current compliance requirements. Elon Alas. Abbey Chatters, DO [place] Abbey Chatters, DO 09/30/2022 8:45:26 AM This report has been signed electronically. Number of Addenda: 0

## 2022-09-30 NOTE — Discharge Instructions (Addendum)

## 2022-09-30 NOTE — Transfer of Care (Addendum)
Immediate Anesthesia Transfer of Care Note  Patient: Bethany [name]  Procedure(s) Performed: COLONOSCOPY WITH PROPOFOL POLYPECTOMY  Patient Location: PACU  Anesthesia Type:General  Level of Consciousness: awake  Airway & Oxygen Therapy: Patient Spontanous Breathing  Post-op Assessment: Report given to RN and Post -op Vital signs reviewed and stable  Post vital signs: Reviewed and stable  Last Vitals:  Vitals Value Taken Time  BP                   94/62    Temp          36.9    Pulse           86         Resp            18    SpO2                100      Last Pain:  Vitals:   09/30/22 0820  TempSrc:   PainSc: 0-No pain      Patients Stated Pain Goal: 10 (70/62/37 6283)  Complications: No notable events documented.

## 2022-09-30 NOTE — Anesthesia Postprocedure Evaluation (Signed)
Anesthesia Post Note  Patient: Bethany [name]  Procedure(s) Performed: COLONOSCOPY WITH PROPOFOL POLYPECTOMY  Patient location during evaluation: Phase II Anesthesia Type: General Level of consciousness: awake and alert and oriented Pain management: pain level controlled Vital Signs Assessment: post-procedure vital signs reviewed and stable Respiratory status: spontaneous breathing, nonlabored ventilation and respiratory function stable Cardiovascular status: blood pressure returned to baseline and stable Postop Assessment: no apparent nausea or vomiting Anesthetic complications: no   No notable events documented.   Last Vitals:  Vitals:   09/30/22 0846 09/30/22 0856  BP: 94/62 100/86  Pulse: 80 88  Resp: (!) 23 18  Temp: 36.9 C   SpO2: 100%     Last Pain:  Vitals:   09/30/22 0846  TempSrc: Oral  PainSc:                  Kishawn Pickar C Shirlette Scarber

## 2022-09-30 NOTE — Addendum Note (Signed)
Addendum  created 09/30/22 1232 by Ollen Bowl, CRNA   Clinical Note Signed

## 2022-10-01 DIAGNOSIS — M79662 Pain in left lower leg: Secondary | ICD-10-CM | POA: Diagnosis not present

## 2022-10-01 DIAGNOSIS — M79605 Pain in left leg: Secondary | ICD-10-CM | POA: Diagnosis not present

## 2022-10-01 DIAGNOSIS — M79604 Pain in right leg: Secondary | ICD-10-CM | POA: Diagnosis not present

## 2022-10-01 DIAGNOSIS — M79661 Pain in right lower leg: Secondary | ICD-10-CM | POA: Diagnosis not present

## 2022-10-01 DIAGNOSIS — I83812 Varicose veins of left lower extremities with pain: Secondary | ICD-10-CM | POA: Diagnosis not present

## 2022-10-01 LAB — SURGICAL PATHOLOGY

## 2022-10-04 ENCOUNTER — Ambulatory Visit: Payer: Medicare Other | Admitting: Physical Therapy

## 2022-10-04 DIAGNOSIS — R293 Abnormal posture: Secondary | ICD-10-CM | POA: Diagnosis not present

## 2022-10-04 DIAGNOSIS — M6281 Muscle weakness (generalized): Secondary | ICD-10-CM | POA: Diagnosis not present

## 2022-10-04 DIAGNOSIS — M5459 Other low back pain: Secondary | ICD-10-CM

## 2022-10-04 NOTE — Therapy (Signed)
OUTPATIENT PHYSICAL THERAPY THORACOLUMBAR TREATMENT   Patient Name: Bethany [name] MRN: [id number] DOB:08-22-1951, 71 y.o., female Today's Date: 10/04/2022   PT End of Session - 10/04/22 1038     Visit Number 3    Number of Visits 12    Date for PT Re-Evaluation 12/27/22    Authorization Type FOTO AT LEAST EVERY 5TH VISIT.  PROGRESS NOTE AT 10TH VISIT.  KX MODIFIER AFTER 15 VISITS.    PT Start Time 0904    PT Stop Time 0953    PT Time Calculation (min) 49 min    Activity Tolerance Patient tolerated treatment well    Behavior During Therapy [facility] for tasks assessed/performed             Past Medical History:  Diagnosis Date   Allergy 1997   knees   Arthritis    GERD (gastroesophageal reflux disease)    Hyperlipidemia    Hypertension    Left leg weakness    Obesity    Pinched nerve    Pneumonia    1 month ago - rsolved   Prediabetes 08/04/2018   Seasonal allergies    Sleep apnea    cpap   Past Surgical History:  Procedure Laterality Date   ABDOMINAL HYSTERECTOMY     BREAST SURGERY     fibroid cysts removed   GANGLION CYST EXCISION     left   phlebectomy     TOTAL KNEE ARTHROPLASTY Right 10/22/2013   Procedure: RIGHT TOTAL KNEE ARTHROPLASTY;  Surgeon: Gearlean Alf, MD;  Location: [facility];  Service: Orthopedics;  Laterality: Right;   TOTAL KNEE ARTHROPLASTY Left 06/11/2015   Procedure: LEFT TOTAL KNEE ARTHROPLASTY;  Surgeon: Gaynelle Arabian, MD;  Location: [facility];  Service: Orthopedics;  Laterality: Left;   TUBAL LIGATION     Patient Active Problem List   Diagnosis Date Noted   Positive colorectal cancer screening using Cologuard test 08/10/2022   Acute foot pain, left 08/21/2021   History of arthroplasty of left knee 01/06/2021   Nerve palsy 12/16/2020   OSA (obstructive sleep apnea) 03/25/2014   OA (osteoarthritis) of knee 10/22/2013   Obesity, unspecified 07/02/2013   Arthritis 07/02/2013   Mouth dryness 07/02/2013   Benign paroxysmal positional vertigo  07/02/2013   Vitamin D deficiency 07/02/2013   Hypertension 05/04/2013   Hyperlipidemia 05/04/2013    REFERRING PROVIDER: Claretta Fraise MD  REFERRING DIAG: Lumbar radiculopathy  Rationale for Evaluation and Treatment Rehabilitation  THERAPY DIAG:  No diagnosis found.  ONSET DATE: ~8 weeks.  SUBJECTIVE:  SUBJECTIVE STATEMENT: Patient presented to the clinic with a pain-level of 4/10.  She states she went to a vein center which r/o a blood clot and the procedure they performed decreased her pain.  PERTINENT HISTORY:  DM, bilateral TKA's.  PAIN:  Are you having pain? Yes: NPRS scale: 4/10 Pain location: Left low back and left LE Pain description: Ache, sore, throbbing, sharp, numb, shooting. Aggravating factors: As above. Relieving factors: as above.   PRECAUTIONS: None  WEIGHT BEARING RESTRICTIONS No  FALLS:  Has patient fallen in last 6 months? No  LIVING ENVIRONMENT: Lives with: lives with their spouse Lives in: House/apartment Has following equipment at home: Quad cane large base  OCCUPATION: Retired.  PLOF: Independent  PATIENT GOALS:  Not have this pain.   OBJECTIVE:    TODAY'S TREATMENT     09-29-22 Manual: STW to LT LB paras, SIJ, and upper glute, piriformis, ITB and left lateral caf x 23 minutes with Pt RT in side lying [place] combo x10 mins 1.5 w/cm2 to LT glute  Right sdly position with folded pillow between knees for comfort: HMP and IFC at 80-150 Hz on 40% scan x 20 minutes to left SIJ, Piriformis region and mid-ITB region.   ASSESSMENT:  CLINICAL IMPRESSION: Patient much improved since a visit to a vein center.  Not nearly as hypersensitive to touch as she was over her left LE        OBJECTIVE IMPAIRMENTS Abnormal gait, decreased activity tolerance, decreased  mobility, difficulty walking, decreased ROM, increased muscle spasms, postural dysfunction, and pain.   ACTIVITY LIMITATIONS carrying, lifting, bending, sitting, standing, transfers, bed mobility, and locomotion level  PARTICIPATION LIMITATIONS: meal prep, cleaning, and laundry  REHAB POTENTIAL: Good  CLINICAL DECISION MAKING: Stable/uncomplicated  EVALUATION COMPLEXITY: Low   GOALS: Goals reviewed with patient? Yes  SHORT TERM GOALS: Target date: 10/18/2022  Ind with a initial HEP. Baseline: Goal status: INITIAL  LONG TERM GOALS: Target date: 11/15/2022  Ind with an advanced HEP. Baseline:  Goal status: INITIAL  2.  Eliminate left LE pain and symptoms. Baseline:  Goal status: INITIAL  3.  Walk without antalgia. Baseline:  Goal status: INITIAL  4.  Perform ADL's with pain not > 3-4/10 Baseline:  Goal status: INITIAL     PLAN: PT FREQUENCY: 2x/week  PT DURATION: 6 weeks  PLANNED INTERVENTIONS: Therapeutic exercises, Therapeutic activity, Neuromuscular re-education, Gait training, Patient/Family education, Self Care, Dry Needling, Electrical stimulation, Cryotherapy, Moist heat, Ultrasound, and Manual therapy.  PLAN FOR NEXT SESSION: STW/M to left SIJ, Piriformis region and ITB, SKTC, Nustep.  Core exercise progression.   Edit Ricciardelli, [place], PT 10/04/2022, 11:08 AM

## 2022-10-06 ENCOUNTER — Encounter (HOSPITAL_COMMUNITY): Payer: Self-pay | Admitting: Internal Medicine

## 2022-10-07 ENCOUNTER — Encounter: Payer: Self-pay | Admitting: *Deleted

## 2022-10-07 ENCOUNTER — Ambulatory Visit: Payer: Medicare Other | Admitting: *Deleted

## 2022-10-07 DIAGNOSIS — M5459 Other low back pain: Secondary | ICD-10-CM

## 2022-10-07 DIAGNOSIS — R293 Abnormal posture: Secondary | ICD-10-CM

## 2022-10-07 DIAGNOSIS — M6281 Muscle weakness (generalized): Secondary | ICD-10-CM

## 2022-10-07 NOTE — Therapy (Signed)
OUTPATIENT PHYSICAL THERAPY THORACOLUMBAR TREATMENT   Patient Name: Bethany [name] MRN: [id number] DOB:1951/07/11, 71 y.o., female Today's Date: 10/07/2022   PT End of Session - 10/07/22 0906     Visit Number 4    Number of Visits 12    Date for PT Re-Evaluation 12/27/22    Authorization Type FOTO AT LEAST EVERY 5TH VISIT.  PROGRESS NOTE AT 10TH VISIT.  KX MODIFIER AFTER 15 VISITS.    PT Start Time 0900    PT Stop Time 0950    PT Time Calculation (min) 50 min             Past Medical History:  Diagnosis Date   Allergy 1997   knees   Arthritis    GERD (gastroesophageal reflux disease)    Hyperlipidemia    Hypertension    Left leg weakness    Obesity    Pinched nerve    Pneumonia    1 month ago - rsolved   Prediabetes 08/04/2018   Seasonal allergies    Sleep apnea    cpap   Past Surgical History:  Procedure Laterality Date   ABDOMINAL HYSTERECTOMY     BREAST SURGERY     fibroid cysts removed   COLONOSCOPY WITH PROPOFOL N/A 09/30/2022   Procedure: COLONOSCOPY WITH PROPOFOL;  Surgeon: [name], DO;  Location: [facility];  Service: Endoscopy;  Laterality: N/A;  8:15am, asa 2   GANGLION CYST EXCISION     left   phlebectomy     POLYPECTOMY  09/30/2022   Procedure: POLYPECTOMY;  Surgeon: [name], DO;  Location: [facility];  Service: Endoscopy;;   TOTAL KNEE ARTHROPLASTY Right 10/22/2013   Procedure: RIGHT TOTAL KNEE ARTHROPLASTY;  Surgeon: Gearlean Alf, MD;  Location: [facility];  Service: Orthopedics;  Laterality: Right;   TOTAL KNEE ARTHROPLASTY Left 06/11/2015   Procedure: LEFT TOTAL KNEE ARTHROPLASTY;  Surgeon: Gaynelle Arabian, MD;  Location: [facility];  Service: Orthopedics;  Laterality: Left;   TUBAL LIGATION     Patient Active Problem List   Diagnosis Date Noted   Positive colorectal cancer screening using Cologuard test 08/10/2022   Acute foot pain, left 08/21/2021   History of arthroplasty of left knee 01/06/2021   Nerve palsy  12/16/2020   OSA (obstructive sleep apnea) 03/25/2014   OA (osteoarthritis) of knee 10/22/2013   Obesity, unspecified 07/02/2013   Arthritis 07/02/2013   Mouth dryness 07/02/2013   Benign paroxysmal positional vertigo 07/02/2013   Vitamin D deficiency 07/02/2013   Hypertension 05/04/2013   Hyperlipidemia 05/04/2013    REFERRING PROVIDER: Claretta Fraise MD  REFERRING DIAG: Lumbar radiculopathy  Rationale for Evaluation and Treatment Rehabilitation  THERAPY DIAG:  Other low back pain  Abnormal posture  Muscle weakness (generalized)  ONSET DATE: ~8 weeks.  SUBJECTIVE:  SUBJECTIVE STATEMENT: Patient presented to the clinic with a pain-level of 4-5/10.  She states she went to a vein center which r/o a blood clot and the procedure they performed decreased her pain. Still doing better  PERTINENT HISTORY:  DM, bilateral TKA's.  PAIN:  Are you having pain? Yes: NPRS scale: 4-5/10 Pain location: Left low back and left LE Pain description: Ache, sore, throbbing, sharp, numb, shooting. Aggravating factors: As above. Relieving factors: as above.   PRECAUTIONS: None  WEIGHT BEARING RESTRICTIONS No  FALLS:  Has patient fallen in last 6 months? No  LIVING ENVIRONMENT: Lives with: lives with their spouse Lives in: House/apartment Has following equipment at home: Quad cane large base  OCCUPATION: Retired.  PLOF: Independent  PATIENT GOALS:  Not have this pain.   OBJECTIVE:    TODAY'S TREATMENT     10-07-22  Manual: STW to LT LB paras, SIJ, and upper glute, piriformis, ITB and left lateral caf x 63mnutes with Pt RT in side lying  [place] x12 mins 1.5 w/cm2 to LT glute  Right sdly position with folded pillow between knees for comfort:  0HMP and IFC at 80-150 Hz on 40% scan x 15  minutes to left SIJ, Piriformis region and mid-ITB region.   ASSESSMENT:  CLINICAL IMPRESSION: Patient arrived today doing fairly well with decreased pain overall. Decreased tenderness/tightness notable during STW. Curren goals are ongoing at this time.      OBJECTIVE IMPAIRMENTS Abnormal gait, decreased activity tolerance, decreased mobility, difficulty walking, decreased ROM, increased muscle spasms, postural dysfunction, and pain.   ACTIVITY LIMITATIONS carrying, lifting, bending, sitting, standing, transfers, bed mobility, and locomotion level  PARTICIPATION LIMITATIONS: meal prep, cleaning, and laundry  REHAB POTENTIAL: Good  CLINICAL DECISION MAKING: Stable/uncomplicated  EVALUATION COMPLEXITY: Low   GOALS: Goals reviewed with patient? Yes  SHORT TERM GOALS: Target date: 10/21/2022  Ind with a initial HEP. Baseline: Goal status: Ongoing  LONG TERM GOALS: Target date: 11/18/2022  Ind with an advanced HEP. Baseline:  Goal status: Ongoing  2.  Eliminate left LE pain and symptoms. Baseline:  Goal status: Ongoing  3.  Walk without antalgia. Baseline:  Goal status: Ongoing  4.  Perform ADL's with pain not > 3-4/10 Baseline:  Goal status: Ongoing     PLAN: PT FREQUENCY: 2x/week  PT DURATION: 6 weeks  PLANNED INTERVENTIONS: Therapeutic exercises, Therapeutic activity, Neuromuscular re-education, Gait training, Patient/Family education, Self Care, Dry Needling, Electrical stimulation, Cryotherapy, Moist heat, Ultrasound, and Manual therapy.  PLAN FOR NEXT SESSION: STW/M to left SIJ, Piriformis region and ITB, SKTC, Nustep.  Core exercise progression.   Lawson Isabell,CHRIS, PTA 10/07/2022, 10:05 AM

## 2022-10-08 ENCOUNTER — Other Ambulatory Visit: Payer: Self-pay | Admitting: Family Medicine

## 2022-10-08 DIAGNOSIS — Z1231 Encounter for screening mammogram for malignant neoplasm of breast: Secondary | ICD-10-CM

## 2022-10-11 ENCOUNTER — Ambulatory Visit (HOSPITAL_COMMUNITY)
Admission: RE | Admit: 2022-10-11 | Discharge: 2022-10-11 | Disposition: A | Discharge status: Home / Self Care | Payer: Medicare Other | Source: Ambulatory Visit | Attending: Family Medicine | Admitting: Family Medicine

## 2022-10-11 ENCOUNTER — Encounter: Payer: Self-pay | Admitting: Physical Therapy

## 2022-10-11 ENCOUNTER — Ambulatory Visit: Payer: Medicare Other | Admitting: Physical Therapy

## 2022-10-11 DIAGNOSIS — M5459 Other low back pain: Secondary | ICD-10-CM

## 2022-10-11 DIAGNOSIS — R293 Abnormal posture: Secondary | ICD-10-CM | POA: Diagnosis not present

## 2022-10-11 DIAGNOSIS — M6281 Muscle weakness (generalized): Secondary | ICD-10-CM | POA: Diagnosis not present

## 2022-10-11 DIAGNOSIS — M5416 Radiculopathy, lumbar region: Secondary | ICD-10-CM | POA: Insufficient documentation

## 2022-10-11 NOTE — Therapy (Signed)
OUTPATIENT PHYSICAL THERAPY THORACOLUMBAR TREATMENT   Patient Name: Bethany [name] MRN: [id number] DOB:March 13, 1951, 71 y.o., female Today's Date: 10/11/2022   PT End of Session - 10/11/22 0853     Visit Number 5    Number of Visits 12    Date for PT Re-Evaluation 12/27/22    Authorization Type FOTO AT LEAST EVERY 5TH VISIT.  PROGRESS NOTE AT 10TH VISIT.  KX MODIFIER AFTER 15 VISITS.    PT Start Time 0821    PT Stop Time 0913    PT Time Calculation (min) 52 min    Activity Tolerance Patient tolerated treatment well    Behavior During Therapy [facility] for tasks assessed/performed             Past Medical History:  Diagnosis Date   Allergy 1997   knees   Arthritis    GERD (gastroesophageal reflux disease)    Hyperlipidemia    Hypertension    Left leg weakness    Obesity    Pinched nerve    Pneumonia    1 month ago - rsolved   Prediabetes 08/04/2018   Seasonal allergies    Sleep apnea    cpap   Past Surgical History:  Procedure Laterality Date   ABDOMINAL HYSTERECTOMY     BREAST SURGERY     fibroid cysts removed   COLONOSCOPY WITH PROPOFOL N/A 09/30/2022   Procedure: COLONOSCOPY WITH PROPOFOL;  Surgeon: [name], DO;  [facility]: [facility];  Service: Endoscopy;  Laterality: N/A;  8:15am, asa 2   GANGLION CYST EXCISION     left   phlebectomy     POLYPECTOMY  09/30/2022   Procedure: POLYPECTOMY;  Surgeon: [name], DO;  [facility]: [facility];  Service: Endoscopy;;   TOTAL KNEE ARTHROPLASTY Right 10/22/2013   Procedure: RIGHT TOTAL KNEE ARTHROPLASTY;  Surgeon: Gearlean Alf, MD;  Location: [facility];  Service: Orthopedics;  Laterality: Right;   TOTAL KNEE ARTHROPLASTY Left 06/11/2015   Procedure: LEFT TOTAL KNEE ARTHROPLASTY;  Surgeon: Gaynelle Arabian, MD;  Location: [facility];  Service: Orthopedics;  Laterality: Left;   TUBAL LIGATION     Patient Active Problem List   Diagnosis Date Noted   Positive colorectal cancer screening using Cologuard test  08/10/2022   Acute foot pain, left 08/21/2021   History of arthroplasty of left knee 01/06/2021   Nerve palsy 12/16/2020   OSA (obstructive sleep apnea) 03/25/2014   OA (osteoarthritis) of knee 10/22/2013   Obesity, unspecified 07/02/2013   Arthritis 07/02/2013   Mouth dryness 07/02/2013   Benign paroxysmal positional vertigo 07/02/2013   Vitamin D deficiency 07/02/2013   Hypertension 05/04/2013   Hyperlipidemia 05/04/2013    REFERRING PROVIDER: Claretta Fraise MD  REFERRING DIAG: Lumbar radiculopathy  Rationale for Evaluation and Treatment Rehabilitation  THERAPY DIAG:  Other low back pain  Abnormal posture  Muscle weakness (generalized)  ONSET DATE: ~8 weeks.  SUBJECTIVE:  SUBJECTIVE STATEMENT: Patient presented to the clinic with a pain-level of 2-3/10.  Using quad cane for extra stability. PERTINENT HISTORY:  DM, bilateral TKA's.  PAIN:  Are you having pain? Yes: NPRS scale: 4-5/10 Pain location: Left low back and left LE Pain description: Ache, sore, throbbing, sharp, numb, shooting. Aggravating factors: As above. Relieving factors: as above.    PATIENT GOALS:  Not have this pain.   OBJECTIVE:    TODAY'S TREATMENT     10-11-22  Nustep level 3 x 12 minutes. Manual: STW to LT LB paras, SIJ, and upper glute, piriformis, x 11 minutes [place] combo x12 mins 1.5 w/cm2 to LT glute  Right sdly position with folded pillow between knees for comfort:  0HMP and IFC at 80-150 Hz on 40% scan x 15 minutes to left SIJ, Piriformis region and mid-ITB region x 20 minutes.   ASSESSMENT:  CLINICAL IMPRESSION: Patient arriving a clinic doing much better.  She did very well with Nustep exercise with no increase in pain.      GOALS: Goals reviewed with patient? Yes  SHORT TERM GOALS: Target  date: 10/25/2022  Ind with a initial HEP. Baseline: Goal status: Ongoing  LONG TERM GOALS: Target date: 11/22/2022  Ind with an advanced HEP. Baseline:  Goal status: Ongoing  2.  Eliminate left LE pain and symptoms. Baseline:  Goal status: Ongoing  3.  Walk without antalgia. Baseline:  Goal status: Ongoing  4.  Perform ADL's with pain not > 3-4/10 Baseline:  Goal status: Ongoing     PLAN: PT FREQUENCY: 2x/week  PT DURATION: 6 weeks  PLANNED INTERVENTIONS: Therapeutic exercises, Therapeutic activity, Neuromuscular re-education, Gait training, Patient/Family education, Self Care, Dry Needling, Electrical stimulation, Cryotherapy, Moist heat, Ultrasound, and Manual therapy.  PLAN FOR NEXT SESSION: STW/M to left SIJ, Piriformis region and ITB, SKTC, Nustep.  Core exercise progression.   Jani Moronta, [place], PT 10/11/2022, 9:37 AM

## 2022-10-14 ENCOUNTER — Ambulatory Visit: Payer: Medicare Other | Admitting: Physical Therapy

## 2022-10-14 DIAGNOSIS — M6281 Muscle weakness (generalized): Secondary | ICD-10-CM | POA: Diagnosis not present

## 2022-10-14 DIAGNOSIS — M5459 Other low back pain: Secondary | ICD-10-CM

## 2022-10-14 DIAGNOSIS — R293 Abnormal posture: Secondary | ICD-10-CM

## 2022-10-14 NOTE — Therapy (Addendum)
OUTPATIENT PHYSICAL THERAPY THORACOLUMBAR TREATMENT   Patient Name: Bethany [name] MRN: [id number] DOB:06-04-1951, 71 y.o., female Today's Date: 10/14/2022   PT End of Session - 10/14/22 0951     Visit Number 6    Number of Visits 12    Date for PT Re-Evaluation 12/27/22    Authorization Type FOTO AT LEAST EVERY 5TH VISIT.  PROGRESS NOTE AT 10TH VISIT.  KX MODIFIER AFTER 15 VISITS.    PT Start Time 0900    PT Stop Time 0944    PT Time Calculation (min) 44 min    Activity Tolerance Patient tolerated treatment well    Behavior During Therapy WFL for tasks assessed/performed              Past Medical History:  Diagnosis Date   Allergy 1997   knees   Arthritis    GERD (gastroesophageal reflux disease)    Hyperlipidemia    Hypertension    Left leg weakness    Obesity    Pinched nerve    Pneumonia    1 month ago - rsolved   Prediabetes 08/04/2018   Seasonal allergies    Sleep apnea    cpap   Past Surgical History:  Procedure Laterality Date   ABDOMINAL HYSTERECTOMY     BREAST SURGERY     fibroid cysts removed   COLONOSCOPY WITH PROPOFOL N/A 09/30/2022   Procedure: COLONOSCOPY WITH PROPOFOL;  Surgeon: [name], DO;  Location: [facility];  Service: Endoscopy;  Laterality: N/A;  8:15am, asa 2   GANGLION CYST EXCISION     left   phlebectomy     POLYPECTOMY  09/30/2022   Procedure: POLYPECTOMY;  Surgeon: [name], DO;  Location: [facility];  Service: Endoscopy;;   TOTAL KNEE ARTHROPLASTY Right 10/22/2013   Procedure: RIGHT TOTAL KNEE ARTHROPLASTY;  Surgeon: Gearlean Alf, MD;  Location: [facility];  Service: Orthopedics;  Laterality: Right;   TOTAL KNEE ARTHROPLASTY Left 06/11/2015   Procedure: LEFT TOTAL KNEE ARTHROPLASTY;  Surgeon: Gaynelle Arabian, MD;  Location: [facility];  Service: Orthopedics;  Laterality: Left;   TUBAL LIGATION     Patient Active Problem List   Diagnosis Date Noted   Positive colorectal cancer screening using Cologuard test  08/10/2022   Acute foot pain, left 08/21/2021   History of arthroplasty of left knee 01/06/2021   Nerve palsy 12/16/2020   OSA (obstructive sleep apnea) 03/25/2014   OA (osteoarthritis) of knee 10/22/2013   Obesity, unspecified 07/02/2013   Arthritis 07/02/2013   Mouth dryness 07/02/2013   Benign paroxysmal positional vertigo 07/02/2013   Vitamin D deficiency 07/02/2013   Hypertension 05/04/2013   Hyperlipidemia 05/04/2013    REFERRING PROVIDER: Claretta Fraise MD  REFERRING DIAG: Lumbar radiculopathy  Rationale for Evaluation and Treatment Rehabilitation  THERAPY DIAG:  Other low back pain  Abnormal posture  Muscle weakness (generalized)  ONSET DATE: ~8 weeks.  SUBJECTIVE:  SUBJECTIVE STATEMENT: Having more pain since Tuesday morning when she woke with pain.  PERTINENT HISTORY:  DM, bilateral TKA's.  PAIN:  Are you having pain? Yes: NPRS scale: 7/10 Pain location: Left low back and left LE Pain description: Ache, sore, throbbing, sharp, numb, shooting. Aggravating factors: As above. Relieving factors: as above.    PATIENT GOALS:  Not have this pain.   OBJECTIVE:    TODAY'S TREATMENT     10-14-22                                     EXERCISE LOG  Exercise Repetitions and Resistance Comments  Nustep L3 x15 min                    Blank cell = exercise not performed today   Modalities  Date: 10/14/22 Unattended Estim: Lumbar, IFC, 15 mins, Pain Combo: Lumbar, 1.5 w/cm2, 100%, 10 mins, Pain Hot Pack: Lumbar, 15 mins, Pain  ASSESSMENT:  CLINICAL IMPRESSION: Patient presented in clinic with reports of high level LBP for unknown reason. Patient states that she hasn't done much in the last few days and awoke Tuesday morning with the pain. Patient able to complete Nustep  and tolerate modalities in efforts to assist with pain reduction. Normal modalities response noted following removal of the modalities.  GOALS: Goals reviewed with patient? Yes  SHORT TERM GOALS: Target date: 10/28/2022  Ind with a initial HEP. Baseline: Goal status: Ongoing  LONG TERM GOALS: Target date: 11/25/2022  Ind with an advanced HEP. Baseline:  Goal status: Ongoing  2.  Eliminate left LE pain and symptoms. Baseline:  Goal status: Ongoing  3.  Walk without antalgia. Baseline:  Goal status: Ongoing  4.  Perform ADL's with pain not > 3-4/10 Baseline:  Goal status: Ongoing  PLAN: PT FREQUENCY: 2x/week  PT DURATION: 6 weeks  PLANNED INTERVENTIONS: Therapeutic exercises, Therapeutic activity, Neuromuscular re-education, Gait training, Patient/Family education, Self Care, Dry Needling, Electrical stimulation, Cryotherapy, Moist heat, Ultrasound, and Manual therapy.  PLAN FOR NEXT SESSION: STW/M to left SIJ, Piriformis region and ITB, SKTC, Nustep.  Core exercise progression.   Standley Brooking, PTA 10/14/2022, 9:53 AM

## 2022-10-18 ENCOUNTER — Ambulatory Visit (INDEPENDENT_AMBULATORY_CARE_PROVIDER_SITE_OTHER): Payer: Medicare Other | Admitting: Family Medicine

## 2022-10-18 ENCOUNTER — Ambulatory Visit: Payer: Medicare Other | Admitting: Physical Therapy

## 2022-10-18 ENCOUNTER — Encounter: Payer: Self-pay | Admitting: Family Medicine

## 2022-10-18 ENCOUNTER — Encounter: Payer: Self-pay | Admitting: Physical Therapy

## 2022-10-18 VITALS — BP 147/91 | HR 77 | Temp 97.3°F | Ht 65.0 in | Wt 198.4 lb

## 2022-10-18 DIAGNOSIS — M6281 Muscle weakness (generalized): Secondary | ICD-10-CM | POA: Diagnosis not present

## 2022-10-18 DIAGNOSIS — M5459 Other low back pain: Secondary | ICD-10-CM

## 2022-10-18 DIAGNOSIS — M5416 Radiculopathy, lumbar region: Secondary | ICD-10-CM

## 2022-10-18 DIAGNOSIS — R293 Abnormal posture: Secondary | ICD-10-CM

## 2022-10-18 MED ORDER — PREGABALIN 50 MG PO CAPS: ORAL_CAPSULE | ORAL | 0 refills | Status: DC

## 2022-10-18 NOTE — Therapy (Signed)
OUTPATIENT PHYSICAL THERAPY THORACOLUMBAR TREATMENT   Patient Name: Bethany [name] MRN: [id number] DOB:04-21-1951, 71 y.o., female Today's Date: 10/18/2022   PT End of Session - 10/18/22 0912     Visit Number 7    Number of Visits 12    Date for PT Re-Evaluation 12/27/22    Authorization Type FOTO AT LEAST EVERY 5TH VISIT.  PROGRESS NOTE AT 10TH VISIT.  KX MODIFIER AFTER 15 VISITS.    PT Start Time 0901    PT Stop Time 0951    PT Time Calculation (min) 50 min    Activity Tolerance Patient tolerated treatment well    Behavior During Therapy [facility] for tasks assessed/performed              Past Medical History:  Diagnosis Date   Allergy 1997   knees   Arthritis    GERD (gastroesophageal reflux disease)    Hyperlipidemia    Hypertension    Left leg weakness    Obesity    Pinched nerve    Pneumonia    1 month ago - rsolved   Prediabetes 08/04/2018   Seasonal allergies    Sleep apnea    cpap   Past Surgical History:  Procedure Laterality Date   ABDOMINAL HYSTERECTOMY     BREAST SURGERY     fibroid cysts removed   COLONOSCOPY WITH PROPOFOL N/A 09/30/2022   Procedure: COLONOSCOPY WITH PROPOFOL;  Surgeon: [name], DO;  [facility]: [facility];  Service: Endoscopy;  Laterality: N/A;  8:15am, asa 2   GANGLION CYST EXCISION     left   phlebectomy     POLYPECTOMY  09/30/2022   Procedure: POLYPECTOMY;  Surgeon: [name], DO;  [facility]: [facility];  Service: Endoscopy;;   TOTAL KNEE ARTHROPLASTY Right 10/22/2013   Procedure: RIGHT TOTAL KNEE ARTHROPLASTY;  Surgeon: Gearlean Alf, MD;  Location: [facility];  Service: Orthopedics;  Laterality: Right;   TOTAL KNEE ARTHROPLASTY Left 06/11/2015   Procedure: LEFT TOTAL KNEE ARTHROPLASTY;  Surgeon: Gaynelle Arabian, MD;  Location: [facility];  Service: Orthopedics;  Laterality: Left;   TUBAL LIGATION     Patient Active Problem List   Diagnosis Date Noted   Positive colorectal cancer screening using Cologuard test  08/10/2022   Acute foot pain, left 08/21/2021   History of arthroplasty of left knee 01/06/2021   Nerve palsy 12/16/2020   OSA (obstructive sleep apnea) 03/25/2014   OA (osteoarthritis) of knee 10/22/2013   Obesity, unspecified 07/02/2013   Arthritis 07/02/2013   Mouth dryness 07/02/2013   Benign paroxysmal positional vertigo 07/02/2013   Vitamin D deficiency 07/02/2013   Hypertension 05/04/2013   Hyperlipidemia 05/04/2013    REFERRING PROVIDER: Claretta Fraise MD  REFERRING DIAG: Lumbar radiculopathy  Rationale for Evaluation and Treatment Rehabilitation  THERAPY DIAG:  Other low back pain  Abnormal posture  Muscle weakness (generalized)  ONSET DATE: ~8 weeks.  SUBJECTIVE:  SUBJECTIVE STATEMENT: Reporting more hip pain today.  PERTINENT HISTORY:  DM, bilateral TKA's.  PAIN:  Are you having pain? Yes: NPRS scale: 7/10 Pain location: Left low back and left LE Pain description: Ache, sore, throbbing, sharp, numb, shooting. Aggravating factors: As above. Relieving factors: as above.  PATIENT GOALS:  Not have this pain.  OBJECTIVE:  0 TODAY'S TREATMENT     10-18-22                                     EXERCISE LOG  Exercise Repetitions and Resistance Comments  Nustep L1 x15 min                    Blank cell = exercise not performed today   Manual Therapy Soft Tissue Mobilization: L SI, glute, lumbar paraspinals, to reduce pain and muscle tightness    Modalities  Date: 10/18/22 Unattended Estim: Lumbar, IFC, 15 mins, Pain Combo: Lumbar, 1.5 w/cm2, 100%, 10 mins, Pain Hot Pack: Lumbar, 15 mins, Pain  ASSESSMENT:  CLINICAL IMPRESSION: Patient presented in clinic with reports of high level L hip pain today. Patient reports that pain overall limited her activity this  weekend due to pain with activity and walking. Patient did present with minimal to moderate muscle tightness to L glute and lumbar paraspinals. Primary focus of today's treatment being L SI region. Normal modalities response noted following removal of the modalities.  GOALS: Goals reviewed with patient? Yes  SHORT TERM GOALS: Target date: 11/01/2022  Ind with a initial HEP. Baseline: Goal status: Ongoing  LONG TERM GOALS: Target date: 11/29/2022  Ind with an advanced HEP. Baseline:  Goal status: Ongoing  2.  Eliminate left LE pain and symptoms. Baseline:  Goal status: Ongoing  3.  Walk without antalgia. Baseline:  Goal status: Ongoing  4.  Perform ADL's with pain not > 3-4/10 Baseline:  Goal status: Ongoing  PLAN: PT FREQUENCY: 2x/week  PT DURATION: 6 weeks  PLANNED INTERVENTIONS: Therapeutic exercises, Therapeutic activity, Neuromuscular re-education, Gait training, Patient/Family education, Self Care, Dry Needling, Electrical stimulation, Cryotherapy, Moist heat, Ultrasound, and Manual therapy.  PLAN FOR NEXT SESSION: STW/M to left SIJ, Piriformis region and ITB, SKTC, Nustep.  Core exercise progression.   Standley Brooking, PTA 10/18/2022, 9:53 AM

## 2022-10-18 NOTE — Progress Notes (Signed)
Subjective:  Patient ID: Bethany [name], female    DOB: 1951/08/28  Age: 71 y.o. MRN: [id number]  CC: Follow-up   HPI Bethany [name] presents for left lateral thigh pain goes to ankle. Continues from last month. Saw vein doctor who checked for clots and negative. PHysical therapy isn't helping. No relief with prednisone, Pain 7- 10/10 Relief with laying still. Hurts too much to move around. Cannot due household chores. Husband helping with ADLs     10/18/2022   10:06 AM 09/16/2022    8:31 AM 08/10/2022    9:46 AM  Depression screen PHQ 2/9  Decreased Interest 0 0 0  Down, Depressed, Hopeless 0 0 0  PHQ - 2 Score 0 0 0    History Bethany [name] has a past medical history of Allergy (1997), Arthritis, GERD (gastroesophageal reflux disease), Hyperlipidemia, Hypertension, Left leg weakness, Obesity, Pinched nerve, Pneumonia, Prediabetes (08/04/2018), Seasonal allergies, and Sleep apnea.   She has a past surgical history that includes Abdominal hysterectomy; Tubal ligation; phlebectomy; Total knee arthroplasty (Right, 10/22/2013); Ganglion cyst excision; Breast surgery; Total knee arthroplasty (Left, 06/11/2015); Colonoscopy with propofol (N/A, 09/30/2022); and polypectomy (09/30/2022).   Her family history includes Arthritis in her brother; Diabetes in her sister; Early death in her father; Heart disease in her mother and sister.She reports that she has never smoked. She has never used smokeless tobacco. She reports that she does not drink alcohol and does not use drugs.    ROS Review of Systems  Objective:  BP (!) 147/91   Pulse 77   Temp (!) 97.3 F (36.3 C)   Ht '5\' 5"'$  (1.651 m)   Wt 198 lb 6.4 oz (90 kg)   SpO2 98%   BMI 33.02 kg/m   BP Readings from Last 3 Encounters:  10/18/22 (!) 147/91  09/30/22 100/86  09/16/22 134/89    Wt Readings from Last 3 Encounters:  10/18/22 198 lb 6.4 oz (90 kg)  09/30/22 199 lb 6.4 oz (90.4 kg)  09/16/22 199 lb 6.4 oz (90.4 kg)     Physical  Exam Constitutional:      General: She is in acute distress.     Appearance: She is well-developed. She is obese. She is ill-appearing.  HENT:     Head: Normocephalic.     Nose: Nose normal.  Eyes:     Pupils: Pupils are equal, round, and reactive to light.  Cardiovascular:     Rate and Rhythm: Normal rate and regular rhythm.  Pulmonary:     Breath sounds: Normal breath sounds.  Musculoskeletal:        General: Tenderness (LLE) present. Normal range of motion.  Skin:    General: Skin is warm and dry.  Neurological:     Mental Status: She is alert and oriented to person, place, and time.       Assessment & Plan:   Bethany [name] was seen today for follow-up.  Diagnoses and all orders for this visit:  Lumbar radiculopathy -     Ambulatory referral to Neurosurgery  Other orders -     pregabalin (LYRICA) 50 MG capsule; 1 qhs X7 days , then 2 qhs X 7d, then 3 qhs X 7d, then 4 qhs       I have discontinued Bethany [name]'s predniSONE. I am also having her start on pregabalin. Additionally, I am having her maintain her Ventolin HFA, fluticasone, rosuvastatin, aspirin EC, olmesartan, diclofenac, acyclovir, meclizine, clobetasol, augmented betamethasone dipropionate, furosemide, and Glycerin-Hypromellose-PEG 400 ([facility] RELIEF  OP).  Allergies as of 10/18/2022       Reactions   Hctz [hydrochlorothiazide]    Hair loss        Medication List        Accurate as of October 18, 2022 10:35 AM. If you have any questions, ask your nurse or doctor.          STOP taking these medications    predniSONE 20 MG tablet Commonly known as: DELTASONE Stopped by: Claretta Fraise, MD       TAKE these medications    acyclovir 200 MG capsule Commonly known as: ZOVIRAX TAKE 1 CAPSULE EVERY DAY AS NEEDED FOR FLAREUPS   aspirin EC 81 MG tablet Take 1 tablet (81 mg total) by mouth daily. Swallow whole.   augmented betamethasone dipropionate 0.05 % cream Commonly known as:  DIPROLENE-AF Apply topically 2 (two) times daily. At affected areas (avoid face and genitals) What changed:  how much to take when to take this reasons to take this   clobetasol 0.05 % external solution Commonly known as: TEMOVATE Apply topically 2 (two) times daily. To scalp What changed:  how much to take when to take this   [facility] RELIE[facility] OP Place 1 drop into both eyes daily.   diclofenac 75 MG EC tablet Commonly known as: VOLTAREN TAKE 1 TABLET TWICE DAILY FOR MUSCLE AND JOINT PAIN What changed:  how much to take how to take this when to take this additional instructions   fluticasone 50 MCG/ACT nasal spray Commonly known as: FLONASE Place 2 sprays into both nostrils as needed. What changed:  when to take this reasons to take this   furosemide 20 MG tablet Commonly known as: LASIX Take 1 tablet (20 mg total) by mouth every morning.   meclizine 25 MG tablet Commonly known as: ANTIVERT TAKE 1 TABLET THREE TIMES DAILY AS NEEDED  FOR  DIZZINESS   olmesartan 40 MG tablet Commonly known as: BENICAR Take 1 tablet (40 mg total) by mouth daily.   pregabalin 50 MG capsule Commonly known as: Lyrica 1 qhs X7 days , then 2 qhs X 7d, then 3 qhs X 7d, then 4 qhs Started by: Claretta Fraise, MD   rosuvastatin 20 MG tablet Commonly known as: CRESTOR TAKE 1 TABLET EVERY DAY   Ventolin HFA 108 (90 Base) MCG/ACT inhaler Generic drug: albuterol INHALE 2 PUFFS BY MOUTH EVERY 6 HOURS AS NEEDED FOR WHEEZING OR SHORTNESS OF BREATH         Follow-up: Return in about 1 month (around 11/18/2022).  Claretta Fraise, M.D.

## 2022-10-19 ENCOUNTER — Other Ambulatory Visit: Payer: Self-pay | Admitting: Family Medicine

## 2022-10-19 DIAGNOSIS — M5416 Radiculopathy, lumbar region: Secondary | ICD-10-CM

## 2022-10-21 ENCOUNTER — Encounter: Payer: Self-pay | Admitting: Physical Therapy

## 2022-10-21 ENCOUNTER — Ambulatory Visit: Payer: Medicare Other | Admitting: Physical Therapy

## 2022-10-21 DIAGNOSIS — M6281 Muscle weakness (generalized): Secondary | ICD-10-CM

## 2022-10-21 DIAGNOSIS — M5459 Other low back pain: Secondary | ICD-10-CM

## 2022-10-21 DIAGNOSIS — R293 Abnormal posture: Secondary | ICD-10-CM | POA: Diagnosis not present

## 2022-10-21 NOTE — Therapy (Signed)
OUTPATIENT PHYSICAL THERAPY THORACOLUMBAR TREATMENT   Patient Name: Bethany [name] MRN: [id number] DOB:Nov 29, 1951, 71 y.o., female Today's Date: 10/21/2022   PT End of Session - 10/21/22 0903     Visit Number 8    Number of Visits 12    Date for PT Re-Evaluation 12/27/22    Authorization Type FOTO AT LEAST EVERY 5TH VISIT.  PROGRESS NOTE AT 10TH VISIT.  KX MODIFIER AFTER 15 VISITS.    PT Start Time 0903    PT Stop Time 0945    PT Time Calculation (min) 42 min    Activity Tolerance Patient tolerated treatment well    Behavior During Therapy WFL for tasks assessed/performed              Past Medical History:  Diagnosis Date   Allergy 1997   knees   Arthritis    GERD (gastroesophageal reflux disease)    Hyperlipidemia    Hypertension    Left leg weakness    Obesity    Pinched nerve    Pneumonia    1 month ago - rsolved   Prediabetes 08/04/2018   Seasonal allergies    Sleep apnea    cpap   Past Surgical History:  Procedure Laterality Date   ABDOMINAL HYSTERECTOMY     BREAST SURGERY     fibroid cysts removed   COLONOSCOPY WITH PROPOFOL N/A 09/30/2022   Procedure: COLONOSCOPY WITH PROPOFOL;  Surgeon: [name], DO;  [facility]: [facility];  Service: Endoscopy;  Laterality: N/A;  8:15am, asa 2   GANGLION CYST EXCISION     left   phlebectomy     POLYPECTOMY  09/30/2022   Procedure: POLYPECTOMY;  Surgeon: [name], DO;  [facility];  Service: Endoscopy;;   TOTAL KNEE ARTHROPLASTY Right 10/22/2013   Procedure: RIGHT TOTAL KNEE ARTHROPLASTY;  Surgeon: Gearlean Alf, MD;  Location: [facility];  Service: Orthopedics;  Laterality: Right;   TOTAL KNEE ARTHROPLASTY Left 06/11/2015   Procedure: LEFT TOTAL KNEE ARTHROPLASTY;  Surgeon: Gaynelle Arabian, MD;  Location: [facility];  Service: Orthopedics;  Laterality: Left;   TUBAL LIGATION     Patient Active Problem List   Diagnosis Date Noted   Positive colorectal cancer screening using Cologuard test  08/10/2022   Acute foot pain, left 08/21/2021   History of arthroplasty of left knee 01/06/2021   Nerve palsy 12/16/2020   OSA (obstructive sleep apnea) 03/25/2014   OA (osteoarthritis) of knee 10/22/2013   Obesity, unspecified 07/02/2013   Arthritis 07/02/2013   Mouth dryness 07/02/2013   Benign paroxysmal positional vertigo 07/02/2013   Vitamin D deficiency 07/02/2013   Hypertension 05/04/2013   Hyperlipidemia 05/04/2013    REFERRING PROVIDER: Claretta Fraise MD  REFERRING DIAG: Lumbar radiculopathy  Rationale for Evaluation and Treatment Rehabilitation  THERAPY DIAG:  Other low back pain  Abnormal posture  Muscle weakness (generalized)  ONSET DATE: ~8 weeks.  SUBJECTIVE:  SUBJECTIVE STATEMENT: Reports less hip pain today.  PERTINENT HISTORY:  DM, bilateral TKA's.  PAIN:  Are you having pain? Yes: NPRS scale: 3-4/10 Pain location: Left low back and left LE Pain description: Ache, sore, throbbing, sharp, numb, shooting. Aggravating factors: As above. Relieving factors: as above.  PATIENT GOALS:  Not have this pain.  OBJECTIVE:   TODAY'S TREATMENT     10-21-22   Manual Therapy Soft Tissue Mobilization: L SI, glute, lumbar paraspinals, to reduce pain and muscle tightness    Modalities  Date: 10/21/22 Unattended Estim: Lumbar, IFC, 15 mins, Pain Combo: Lumbar, 1.5 w/cm2, 100%, 10 mins, Pain Hot Pack: Lumbar, 15 mins, Pain  ASSESSMENT:  CLINICAL IMPRESSION: Patient presented in clinic with reports of less L LBP. Patient progressed to more manual therapy to further reduce muscle tone and complete STW to L SI region. No complaints reported during today's session. Normal modalities response noted following removal of the modalities.  GOALS: Goals reviewed with patient?  Yes  SHORT TERM GOALS: Target date: 11/04/2022  Ind with a initial HEP. Baseline: Goal status: Ongoing  LONG TERM GOALS: Target date: 12/02/2022  Ind with an advanced HEP. Baseline:  Goal status: Ongoing  2.  Eliminate left LE pain and symptoms. Baseline:  Goal status: Ongoing  3.  Walk without antalgia. Baseline:  Goal status: Ongoing  4.  Perform ADL's with pain not > 3-4/10 Baseline:  Goal status: Ongoing  PLAN: PT FREQUENCY: 2x/week  PT DURATION: 6 weeks  PLANNED INTERVENTIONS: Therapeutic exercises, Therapeutic activity, Neuromuscular re-education, Gait training, Patient/Family education, Self Care, Dry Needling, Electrical stimulation, Cryotherapy, Moist heat, Ultrasound, and Manual therapy.  PLAN FOR NEXT SESSION: STW/M to left SIJ, Piriformis region and ITB, SKTC, Nustep.  Core exercise progression.   Standley Brooking, PTA 10/21/2022, 9:53 AM

## 2022-10-25 ENCOUNTER — Ambulatory Visit: Payer: Medicare Other

## 2022-10-25 DIAGNOSIS — R293 Abnormal posture: Secondary | ICD-10-CM | POA: Diagnosis not present

## 2022-10-25 DIAGNOSIS — M5459 Other low back pain: Secondary | ICD-10-CM

## 2022-10-25 DIAGNOSIS — M6281 Muscle weakness (generalized): Secondary | ICD-10-CM

## 2022-10-25 NOTE — Therapy (Signed)
OUTPATIENT PHYSICAL THERAPY THORACOLUMBAR TREATMENT   Patient Name: Bethany [name] MRN: [id number] DOB:Feb 28, 1951, 71 y.o., female Today's Date: 10/25/2022   PT End of Session - 10/25/22 1040     Visit Number 9    Number of Visits 12    Date for PT Re-Evaluation 12/27/22    Authorization Type FOTO AT LEAST EVERY 5TH VISIT.  PROGRESS NOTE AT 10TH VISIT.  KX MODIFIER AFTER 15 VISITS.    PT Start Time 1030    PT Stop Time 1120    PT Time Calculation (min) 50 min    Activity Tolerance Patient tolerated treatment well    Behavior During Therapy WFL for tasks assessed/performed               Past Medical History:  Diagnosis Date   Allergy 1997   knees   Arthritis    GERD (gastroesophageal reflux disease)    Hyperlipidemia    Hypertension    Left leg weakness    Obesity    Pinched nerve    Pneumonia    1 month ago - rsolved   Prediabetes 08/04/2018   Seasonal allergies    Sleep apnea    cpap   Past Surgical History:  Procedure Laterality Date   ABDOMINAL HYSTERECTOMY     BREAST SURGERY     fibroid cysts removed   COLONOSCOPY WITH PROPOFOL N/A 09/30/2022   Procedure: COLONOSCOPY WITH PROPOFOL;  Surgeon: [name], DO;  [facility];  Service: Endoscopy;  Laterality: N/A;  8:15am, asa 2   GANGLION CYST EXCISION     left   phlebectomy     POLYPECTOMY  09/30/2022   Procedure: POLYPECTOMY;  Surgeon: [name], DO;  [facility]: [facility];  Service: Endoscopy;;   TOTAL KNEE ARTHROPLASTY Right 10/22/2013   Procedure: RIGHT TOTAL KNEE ARTHROPLASTY;  Surgeon: Gearlean Alf, MD;  Location: [facility];  Service: Orthopedics;  Laterality: Right;   TOTAL KNEE ARTHROPLASTY Left 06/11/2015   Procedure: LEFT TOTAL KNEE ARTHROPLASTY;  Surgeon: Gaynelle Arabian, MD;  Location: [facility];  Service: Orthopedics;  Laterality: Left;   TUBAL LIGATION     Patient Active Problem List   Diagnosis Date Noted   Positive colorectal cancer screening using Cologuard test  08/10/2022   Acute foot pain, left 08/21/2021   History of arthroplasty of left knee 01/06/2021   Nerve palsy 12/16/2020   OSA (obstructive sleep apnea) 03/25/2014   OA (osteoarthritis) of knee 10/22/2013   Obesity, unspecified 07/02/2013   Arthritis 07/02/2013   Mouth dryness 07/02/2013   Benign paroxysmal positional vertigo 07/02/2013   Vitamin D deficiency 07/02/2013   Hypertension 05/04/2013   Hyperlipidemia 05/04/2013    REFERRING PROVIDER: Claretta Fraise MD  REFERRING DIAG: Lumbar radiculopathy  Rationale for Evaluation and Treatment Rehabilitation  THERAPY DIAG:  Other low back pain  Abnormal posture  Muscle weakness (generalized)  ONSET DATE: ~8 weeks.  SUBJECTIVE:  SUBJECTIVE STATEMENT: She feels like her pain is staying about the same and not getting any better or worse.   PERTINENT HISTORY:  DM, bilateral TKA's.  PAIN:  Are you having pain? Yes: NPRS scale: 5/10 Pain location: Left low back and left LE Pain description: Ache, sore, throbbing, sharp, numb, shooting. Aggravating factors: As above. Relieving factors: as above.  PATIENT GOALS:  Not have this pain.  OBJECTIVE:   TODAY'S TREATMENT                                        10/30 EXERCISE LOG  Exercise Repetitions and Resistance Comments  Nustep L2-3 x 15 minutes   L side lying clams 30 reps   L side lying hip ADD isometric 2 minutes w/ 5 second hold            Blank cell = exercise not performed today  Manual Therapy Soft Tissue Mobilization: left piriformis, gluteals, and hamstrings, for reduced pain and improved soft tissue extensibility   Modalities: no redness or adverse reaction with today's modalities  Date:  Unattended Estim: Lumbar, IFC @ 80-150 Hz w/ 40% scan, 15 mins, Pain Hot Pack: Lumbar, 15  mins, Pain  ASSESSMENT:  CLINICAL IMPRESSION: Patient was introduced to side lying clams and hip adduction isometrics for light piriformis and hip adductor engagement. She required minimal cueing with these new interventions for proper exercise performance. She was able to complete these interventions without increasing her familiar symptoms. Manual therapy focused on soft tissue mobilization to the gluteals, piriformis, and hamstring. However, this was unable to significantly reduce her familiar symptoms. She reported feeling better upon the conclusion of treatment. She continues to require skilled physical therapy to address her remaining impairments to return to her prior level of function.   GOALS: Goals reviewed with patient? Yes  SHORT TERM GOALS: Target date: 11/08/2022  Ind with a initial HEP. Baseline: Goal status: Ongoing  LONG TERM GOALS: Target date: 12/06/2022  Ind with an advanced HEP. Baseline:  Goal status: Ongoing  2.  Eliminate left LE pain and symptoms. Baseline:  Goal status: Ongoing  3.  Walk without antalgia. Baseline:  Goal status: Ongoing  4.  Perform ADL's with pain not > 3-4/10 Baseline:  Goal status: Ongoing  PLAN: PT FREQUENCY: 2x/week  PT DURATION: 6 weeks  PLANNED INTERVENTIONS: Therapeutic exercises, Therapeutic activity, Neuromuscular re-education, Gait training, Patient/Family education, Self Care, Dry Needling, Electrical stimulation, Cryotherapy, Moist heat, Ultrasound, and Manual therapy.  PLAN FOR NEXT SESSION: STW/M to left SIJ, Piriformis region and ITB, SKTC, Nustep.  Core exercise progression.   Darlin Coco, PT 10/25/2022, 12:48 PM

## 2022-10-28 ENCOUNTER — Ambulatory Visit: Payer: Medicare Other | Attending: Family Medicine

## 2022-10-28 DIAGNOSIS — R293 Abnormal posture: Secondary | ICD-10-CM | POA: Insufficient documentation

## 2022-10-28 DIAGNOSIS — M5459 Other low back pain: Secondary | ICD-10-CM | POA: Diagnosis not present

## 2022-10-28 DIAGNOSIS — M6281 Muscle weakness (generalized): Secondary | ICD-10-CM | POA: Diagnosis not present

## 2022-10-28 NOTE — Therapy (Signed)
OUTPATIENT PHYSICAL THERAPY THORACOLUMBAR TREATMENT   Patient Name: Tenelle Andreason MRN: [id number] DOB:10-19-1951, 71 y.o., female Today's Date: 10/28/2022   PT End of Session - 10/28/22 0952     Visit Number 10    Number of Visits 12    Date for PT Re-Evaluation 12/27/22    Authorization Type FOTO AT LEAST EVERY 5TH VISIT.  PROGRESS NOTE AT 10TH VISIT.  KX MODIFIER AFTER 15 VISITS.    PT Start Time 0945    PT Stop Time 8242    PT Time Calculation (min) 59 min    Activity Tolerance Patient tolerated treatment well    Behavior During Therapy WFL for tasks assessed/performed               Past Medical History:  Diagnosis Date   Allergy 1997   knees   Arthritis    GERD (gastroesophageal reflux disease)    Hyperlipidemia    Hypertension    Left leg weakness    Obesity    Pinched nerve    Pneumonia    1 month ago - rsolved   Prediabetes 08/04/2018   Seasonal allergies    Sleep apnea    cpap   Past Surgical History:  Procedure Laterality Date   ABDOMINAL HYSTERECTOMY     BREAST SURGERY     fibroid cysts removed   COLONOSCOPY WITH PROPOFOL N/A 09/30/2022   Procedure: COLONOSCOPY WITH PROPOFOL;  Surgeon: [name], DO;  [facility]: [facility];  Service: Endoscopy;  Laterality: N/A;  8:15am, asa 2   GANGLION CYST EXCISION     left   phlebectomy     POLYPECTOMY  09/30/2022   Procedure: POLYPECTOMY;  Surgeon: [name], DO;  [facility];  Service: Endoscopy;;   TOTAL KNEE ARTHROPLASTY Right 10/22/2013   Procedure: RIGHT TOTAL KNEE ARTHROPLASTY;  Surgeon: Gearlean Alf, MD;  Location: [facility];  Service: Orthopedics;  Laterality: Right;   TOTAL KNEE ARTHROPLASTY Left 06/11/2015   Procedure: LEFT TOTAL KNEE ARTHROPLASTY;  Surgeon: Gaynelle Arabian, MD;  Location: [facility];  Service: Orthopedics;  Laterality: Left;   TUBAL LIGATION     Patient Active Problem List   Diagnosis Date Noted   Positive colorectal cancer screening using Cologuard test  08/10/2022   Acute foot pain, left 08/21/2021   History of arthroplasty of left knee 01/06/2021   Nerve palsy 12/16/2020   OSA (obstructive sleep apnea) 03/25/2014   OA (osteoarthritis) of knee 10/22/2013   Obesity, unspecified 07/02/2013   Arthritis 07/02/2013   Mouth dryness 07/02/2013   Benign paroxysmal positional vertigo 07/02/2013   Vitamin D deficiency 07/02/2013   Hypertension 05/04/2013   Hyperlipidemia 05/04/2013    REFERRING PROVIDER: Claretta Fraise MD  REFERRING DIAG: Lumbar radiculopathy  Rationale for Evaluation and Treatment Rehabilitation  THERAPY DIAG:  Other low back pain  Abnormal posture  Muscle weakness (generalized)  ONSET DATE: ~8 weeks.  SUBJECTIVE:  SUBJECTIVE STATEMENT: Reports feeling much better today with decreased pain.    PERTINENT HISTORY:  DM, bilateral TKA's.  PAIN:  Are you having pain? Yes: NPRS scale: 2/10 Pain location: Left low back and left LE Pain description: Ache, sore, throbbing, sharp, numb, shooting. Aggravating factors: As above. Relieving factors: as above.  PATIENT GOALS:  Not have this pain.  OBJECTIVE:   TODAY'S TREATMENT                                        10/30 EXERCISE LOG  Exercise Repetitions and Resistance Comments  Nustep L3 x 15 minutes   L side lying clams 30 reps   L side lying hip ADD isometric 2 minutes w/ 5 second hold            Blank cell = exercise not performed today  Manual Therapy Soft Tissue Mobilization: left piriformis, gluteals, and hamstrings, for reduced pain and improved soft tissue extensibility   Modalities: no redness or adverse reaction with today's modalities  Date:  Unattended Estim: Lumbar, IFC @ 80-150 Hz w/ 40% scan, 15 mins, Pain Hot Pack: Lumbar, 15 mins,  Pain  ASSESSMENT:  CLINICAL IMPRESSION: Pt arrives for today's treatment session reporting 2/10 low back pain.  Pt reports performing exercises at home as instructed and can tell that it is really helping. Pt able to improve FOTO score to 41 today.  STW/M performed to left piriformis, glutes, and hamstrings to decrease pain and tone with pt positioned in right side lying for comfort.  Normal responses to estim and MH noted upon removal.  Pt denied any pain at completion of today's treatment session.  GOALS: Goals reviewed with patient? Yes  SHORT TERM GOALS: Target date: 11/08/2022  Ind with a initial HEP. Baseline: Goal status: MET  LONG TERM GOALS: Target date: 12/06/2022  Ind with an advanced HEP. Baseline:  Goal status: Ongoing  2.  Eliminate left LE pain and symptoms. Baseline:  Goal status: Ongoing  3.  Walk without antalgia. Baseline:  Goal status: MET  4.  Perform ADL's with pain not > 3-4/10 Baseline: 11/2: depends on the day Goal status: Ongoing  PLAN: PT FREQUENCY: 2x/week  PT DURATION: 6 weeks  PLANNED INTERVENTIONS: Therapeutic exercises, Therapeutic activity, Neuromuscular re-education, Gait training, Patient/Family education, Self Care, Dry Needling, Electrical stimulation, Cryotherapy, Moist heat, Ultrasound, and Manual therapy.  PLAN FOR NEXT SESSION: STW/M to left SIJ, Piriformis region and ITB, SKTC, Nustep.  Core exercise progression.   Kathrynn Ducking, PTA 10/28/2022, 10:55 AM

## 2022-11-01 ENCOUNTER — Encounter: Payer: Medicare Other | Admitting: Physical Therapy

## 2022-11-01 DIAGNOSIS — M5416 Radiculopathy, lumbar region: Secondary | ICD-10-CM | POA: Diagnosis not present

## 2022-11-01 DIAGNOSIS — Z6832 Body mass index (BMI) 32.0-32.9, adult: Secondary | ICD-10-CM | POA: Diagnosis not present

## 2022-11-04 ENCOUNTER — Encounter: Payer: Self-pay | Admitting: Physical Therapy

## 2022-11-04 ENCOUNTER — Ambulatory Visit: Payer: Medicare Other | Admitting: Physical Therapy

## 2022-11-04 DIAGNOSIS — M5459 Other low back pain: Secondary | ICD-10-CM

## 2022-11-04 DIAGNOSIS — R293 Abnormal posture: Secondary | ICD-10-CM

## 2022-11-04 DIAGNOSIS — M6281 Muscle weakness (generalized): Secondary | ICD-10-CM

## 2022-11-04 NOTE — Therapy (Signed)
OUTPATIENT PHYSICAL THERAPY THORACOLUMBAR TREATMENT   Patient Name: Bethany [name] MRN: [id number] DOB:[birth year], 71 y.o., female Today's Date: 11/04/2022   PT End of Session - 11/04/22 1000     Visit Number 11    Number of Visits 12    Date for PT Re-Evaluation 12/27/22    Authorization Type FOTO AT LEAST EVERY 5TH VISIT.  PROGRESS NOTE AT 10TH VISIT.  KX MODIFIER AFTER 15 VISITS.    PT Start Time [phone]    PT Stop Time 1033    PT Time Calculation (min) 45 min    Activity Tolerance Patient tolerated treatment well    Behavior During Therapy WFL for tasks assessed/performed            Past Medical History:  Diagnosis Date   Allergy 1997   knees   Arthritis    GERD (gastroesophageal reflux disease)    Hyperlipidemia    Hypertension    Left leg weakness    Obesity    Pinched nerve    Pneumonia    1 month ago - rsolved   Prediabetes 08/04/2018   Seasonal allergies    Sleep apnea    cpap   Past Surgical History:  Procedure Laterality Date   ABDOMINAL HYSTERECTOMY     BREAST SURGERY     fibroid cysts removed   COLONOSCOPY WITH PROPOFOL N/A 09/30/2022   Procedure: COLONOSCOPY WITH PROPOFOL;  Surgeon: [name], DO;  [facility];  Service: Endoscopy;  Laterality: N/A;  8:15am, asa 2   GANGLION CYST EXCISION     left   phlebectomy     POLYPECTOMY  09/30/2022   Procedure: POLYPECTOMY;  Surgeon: [name], DO;  [facility];  Service: Endoscopy;;   TOTAL KNEE ARTHROPLASTY Right 10/22/2013   Procedure: RIGHT TOTAL KNEE ARTHROPLASTY;  Surgeon: Gearlean Alf, MD;  Location: [facility];  Service: Orthopedics;  Laterality: Right;   TOTAL KNEE ARTHROPLASTY Left 06/11/2015   Procedure: LEFT TOTAL KNEE ARTHROPLASTY;  Surgeon: Gaynelle Arabian, MD;  Location: [facility];  Service: Orthopedics;  Laterality: Left;   TUBAL LIGATION     Patient Active Problem List   Diagnosis Date Noted   Positive colorectal cancer screening using Cologuard test  08/10/2022   Acute foot pain, left 08/21/2021   History of arthroplasty of left knee 01/06/2021   Nerve palsy 12/16/2020   OSA (obstructive sleep apnea) 03/25/2014   OA (osteoarthritis) of knee 10/22/2013   Obesity, unspecified 07/02/2013   Arthritis 07/02/2013   Mouth dryness 07/02/2013   Benign paroxysmal positional vertigo 07/02/2013   Vitamin D deficiency 07/02/2013   Hypertension 05/04/2013   Hyperlipidemia 05/04/2013   REFERRING PROVIDER: Claretta Fraise MD  REFERRING DIAG: Lumbar radiculopathy  Rationale for Evaluation and Treatment Rehabilitation  THERAPY DIAG:  Other low back pain  Abnormal posture  Muscle weakness (generalized)  ONSET DATE: ~8 weeks.  SUBJECTIVE:  SUBJECTIVE STATEMENT: Has been doing well with less pain overall.  PERTINENT HISTORY:  DM, bilateral TKA's.  PAIN:  Are you having pain? Yes: NPRS scale: 2/10 Pain location: Left low back and left LE Pain description: Ache, sore, throbbing, sharp, numb, shooting. Aggravating factors: As above. Relieving factors: as above.  PATIENT GOALS:  Not have this pain.  OBJECTIVE:   TODAY'S TREATMENT                                        11/9 EXERCISE LOG  Exercise Repetitions and Resistance Comments  Nustep L4 x 15 minutes   L side lying clams 20 reps   Supine pillow squeeze X20 reps   Bridge 15 reps        Blank cell = exercise not performed today  \Manual Therapy Soft Tissue Mobilization: L SI, reduce pain surrounding the L SI    Modalities  Date: 11/9 Unattended Estim: Lumbar, Pre-Mod, 10 mins, Pain  ASSESSMENT:  CLINICAL IMPRESSION: Patient presented in clinic with reports of mild LBP. Patient has seen improvement in function and pleased with her progress. Patient able to tolerate all therex well with  no complaints. Patient still sensitive to palpation over the L SI joint. Normal stimulation response noted following removal of the modality.  GOALS: Goals reviewed with patient? Yes  SHORT TERM GOALS: Target date: 11/08/2022  Ind with a initial HEP. Baseline: Goal status: MET  LONG TERM GOALS: Target date: 12/06/2022  Ind with an advanced HEP. Baseline:  Goal status: Ongoing  2.  Eliminate left LE pain and symptoms. Baseline:  Goal status: Ongoing  3.  Walk without antalgia. Baseline:  Goal status: MET  4.  Perform ADL's with pain not > 3-4/10 Baseline: 11/2: depends on the day Goal status: Ongoing  PLAN: PT FREQUENCY: 2x/week  PT DURATION: 6 weeks  PLANNED INTERVENTIONS: Therapeutic exercises, Therapeutic activity, Neuromuscular re-education, Gait training, Patient/Family education, Self Care, Dry Needling, Electrical stimulation, Cryotherapy, Moist heat, Ultrasound, and Manual therapy.  PLAN FOR NEXT SESSION: STW/M to left SIJ, Piriformis region and ITB, SKTC, Nustep.  Core exercise progression.   Standley Brooking, PTA 11/04/2022, 10:36 AM

## 2022-11-08 ENCOUNTER — Ambulatory Visit: Payer: Medicare Other | Admitting: Physical Therapy

## 2022-11-08 ENCOUNTER — Encounter: Payer: Self-pay | Admitting: Physical Therapy

## 2022-11-08 DIAGNOSIS — M5459 Other low back pain: Secondary | ICD-10-CM | POA: Diagnosis not present

## 2022-11-08 DIAGNOSIS — R293 Abnormal posture: Secondary | ICD-10-CM | POA: Diagnosis not present

## 2022-11-08 DIAGNOSIS — M6281 Muscle weakness (generalized): Secondary | ICD-10-CM

## 2022-11-08 NOTE — Therapy (Signed)
OUTPATIENT PHYSICAL THERAPY THORACOLUMBAR TREATMENT   Patient Name: Bethany [name] MRN: [id number] DOB:05-15-1951, 71 y.o., female Today's Date: 11/08/2022   PT End of Session - 11/08/22 1051     Visit Number 12    Number of Visits 12    Date for PT Re-Evaluation 12/27/22    Authorization Type FOTO AT LEAST EVERY 5TH VISIT.  PROGRESS NOTE AT 10TH VISIT.  KX MODIFIER AFTER 15 VISITS.    PT Start Time 1050    PT Stop Time 1124    PT Time Calculation (min) 34 min    Activity Tolerance Patient tolerated treatment well    Behavior During Therapy WFL for tasks assessed/performed            Past Medical History:  Diagnosis Date   Allergy 1997   knees   Arthritis    GERD (gastroesophageal reflux disease)    Hyperlipidemia    Hypertension    Left leg weakness    Obesity    Pinched nerve    Pneumonia    1 month ago - rsolved   Prediabetes 08/04/2018   Seasonal allergies    Sleep apnea    cpap   Past Surgical History:  Procedure Laterality Date   ABDOMINAL HYSTERECTOMY     BREAST SURGERY     fibroid cysts removed   COLONOSCOPY WITH PROPOFOL N/A 09/30/2022   Procedure: COLONOSCOPY WITH PROPOFOL;  Surgeon: [name], DO;  [facility];  Service: Endoscopy;  Laterality: N/A;  8:15am, asa 2   GANGLION CYST EXCISION     left   phlebectomy     POLYPECTOMY  09/30/2022   Procedure: POLYPECTOMY;  Surgeon: [name], DO;  [facility];  Service: Endoscopy;;   TOTAL KNEE ARTHROPLASTY Right 10/22/2013   Procedure: RIGHT TOTAL KNEE ARTHROPLASTY;  Surgeon: Gearlean Alf, MD;  Location[facility];  Service: Orthopedics;  Laterality: Right;   TOTAL KNEE ARTHROPLASTY Left 06/11/2015   Procedure: LEFT TOTAL KNEE ARTHROPLASTY;  Surgeon: Gaynelle Arabian, MD;  Location: [facility];  Service: Orthopedics;  Laterality: Left;   TUBAL LIGATION     Patient Active Problem List   Diagnosis Date Noted   Positive colorectal cancer screening using Cologuard test  08/10/2022   Acute foot pain, left 08/21/2021   History of arthroplasty of left knee 01/06/2021   Nerve palsy 12/16/2020   OSA (obstructive sleep apnea) 03/25/2014   OA (osteoarthritis) of knee 10/22/2013   Obesity, unspecified 07/02/2013   Arthritis 07/02/2013   Mouth dryness 07/02/2013   Benign paroxysmal positional vertigo 07/02/2013   Vitamin D deficiency 07/02/2013   Hypertension 05/04/2013   Hyperlipidemia 05/04/2013   REFERRING PROVIDER: Claretta Fraise MD  REFERRING DIAG: Lumbar radiculopathy  Rationale for Evaluation and Treatment Rehabilitation  THERAPY DIAG:  Other low back pain  Abnormal posture  Muscle weakness (generalized)  ONSET DATE: ~8 weeks.  SUBJECTIVE:  SUBJECTIVE STATEMENT: Reports that she had a sharp pain this morning but is okay at this time.  PERTINENT HISTORY:  DM, bilateral TKA's.  PAIN:  Are you having pain? Yes: NPRS scale: 2/10 Pain location: Left low back and left LE Pain description: Ache, sore, throbbing, sharp, numb, shooting. Aggravating factors: As above. Relieving factors: as above.  PATIENT GOALS:  Not have this pain.  OBJECTIVE:   TODAY'S TREATMENT                                        11/13 EXERCISE LOG  Exercise Repetitions and Resistance Comments  Nustep L4 x 15 minutes   Hip abduction LLE x20 reps, RLE x10 reps Limited by LLE pain  SL clam X20 reps            Blank cell = exercise not performed today   Modalities  Date: 11/13 Unattended Estim: Lumbar, Pre-Mod, 10 mins, Pain  ASSESSMENT:  CLINICAL IMPRESSION: Patient presented in clinic with reports of a sharp pain this morning that had went away. Patient limited with B hip abduction due to LLE pain that worsened and terminated the standing exercises. Patient also requested  electrical stimulation for the session. Normal stimulation response noted following removal of the modality. Patient provided new HEP to modify reps for comfort to offset any new flare ups.  GOALS: Goals reviewed with patient? Yes  SHORT TERM GOALS: Target date: 11/08/2022  Ind with a initial HEP. Baseline: Goal status: MET  LONG TERM GOALS: Target date: 12/06/2022  Ind with an advanced HEP. Baseline:  Goal status: Ongoing  2.  Eliminate left LE pain and symptoms. Baseline:  Goal status: Partially met  3.  Walk without antalgia. Baseline:  Goal status: MET  4.  Perform ADL's with pain not > 3-4/10 Baseline: 11/2: depends on the day Goal status: Partially met (with prolonged activity)  PLAN: PT FREQUENCY: 2x/week  PT DURATION: 6 weeks  PLANNED INTERVENTIONS: Therapeutic exercises, Therapeutic activity, Neuromuscular re-education, Gait training, Patient/Family education, Self Care, Dry Needling, Electrical stimulation, Cryotherapy, Moist heat, Ultrasound, and Manual therapy.  PLAN FOR NEXT SESSION: STW/M to left SIJ, Piriformis region and ITB, SKTC, Nustep.  Core exercise progression.  Standley Brooking, PTA 11/08/2022, 11:47 AM

## 2022-11-12 DIAGNOSIS — G2581 Restless legs syndrome: Secondary | ICD-10-CM | POA: Diagnosis not present

## 2022-11-12 DIAGNOSIS — I872 Venous insufficiency (chronic) (peripheral): Secondary | ICD-10-CM | POA: Diagnosis not present

## 2022-11-12 DIAGNOSIS — R252 Cramp and spasm: Secondary | ICD-10-CM | POA: Diagnosis not present

## 2022-11-12 DIAGNOSIS — R600 Localized edema: Secondary | ICD-10-CM | POA: Diagnosis not present

## 2022-11-17 ENCOUNTER — Ambulatory Visit
Admission: RE | Admit: 2022-11-17 | Discharge: 2022-11-17 | Disposition: A | Discharge status: Home / Self Care | Payer: Medicare Other | Source: Ambulatory Visit | Attending: Family Medicine | Admitting: Family Medicine

## 2022-11-17 DIAGNOSIS — Z1231 Encounter for screening mammogram for malignant neoplasm of breast: Secondary | ICD-10-CM | POA: Diagnosis not present

## 2022-12-03 DIAGNOSIS — M7989 Other specified soft tissue disorders: Secondary | ICD-10-CM | POA: Diagnosis not present

## 2022-12-03 DIAGNOSIS — I83812 Varicose veins of left lower extremities with pain: Secondary | ICD-10-CM | POA: Diagnosis not present

## 2022-12-03 DIAGNOSIS — I83892 Varicose veins of left lower extremities with other complications: Secondary | ICD-10-CM | POA: Diagnosis not present

## 2022-12-07 DIAGNOSIS — B351 Tinea unguium: Secondary | ICD-10-CM | POA: Diagnosis not present

## 2022-12-07 DIAGNOSIS — M79676 Pain in unspecified toe(s): Secondary | ICD-10-CM | POA: Diagnosis not present

## 2022-12-07 DIAGNOSIS — L84 Corns and callosities: Secondary | ICD-10-CM | POA: Diagnosis not present

## 2022-12-07 DIAGNOSIS — E1142 Type 2 diabetes mellitus with diabetic polyneuropathy: Secondary | ICD-10-CM | POA: Diagnosis not present

## 2022-12-08 DIAGNOSIS — L661 Lichen planopilaris: Secondary | ICD-10-CM | POA: Diagnosis not present

## 2022-12-17 DIAGNOSIS — I83892 Varicose veins of left lower extremities with other complications: Secondary | ICD-10-CM | POA: Diagnosis not present

## 2022-12-20 ENCOUNTER — Other Ambulatory Visit: Payer: Self-pay | Admitting: Family Medicine

## 2022-12-31 DIAGNOSIS — M79662 Pain in left lower leg: Secondary | ICD-10-CM | POA: Diagnosis not present

## 2022-12-31 DIAGNOSIS — I83892 Varicose veins of left lower extremities with other complications: Secondary | ICD-10-CM | POA: Diagnosis not present

## 2023-01-02 ENCOUNTER — Other Ambulatory Visit: Payer: Self-pay | Admitting: Family Medicine

## 2023-01-02 DIAGNOSIS — M179 Osteoarthritis of knee, unspecified: Secondary | ICD-10-CM

## 2023-01-02 DIAGNOSIS — I10 Essential (primary) hypertension: Secondary | ICD-10-CM

## 2023-02-09 ENCOUNTER — Telehealth: Payer: Self-pay | Admitting: Family Medicine

## 2023-02-09 NOTE — Telephone Encounter (Signed)
Patient reports that three days ago she began having a sharp pain on her right side near the bra line that she describes as feeling like an electrical shock.  She reports that the pain happens sporadically, sometimes more sharp than others.  She is not experiencing any other symptoms, no shortness of breath, only the sharp pain.  An appointment was scheduled for the patient with Alvie Heidelberg on 02/11/23 at 11:05 am.  Patient instructed if any thing were to worsen she is to go on to the emergency room.  Patient voices understanding.

## 2023-02-11 ENCOUNTER — Encounter: Payer: Self-pay | Admitting: Family Medicine

## 2023-02-11 ENCOUNTER — Ambulatory Visit (INDEPENDENT_AMBULATORY_CARE_PROVIDER_SITE_OTHER): Payer: Medicare Other | Admitting: Family Medicine

## 2023-02-11 VITALS — BP 164/83 | HR 67 | Temp 98.0°F | Ht 65.0 in | Wt 203.0 lb

## 2023-02-11 DIAGNOSIS — R109 Unspecified abdominal pain: Secondary | ICD-10-CM

## 2023-02-11 DIAGNOSIS — R100 Acute abdomen: Secondary | ICD-10-CM | POA: Diagnosis not present

## 2023-02-11 LAB — URINALYSIS, ROUTINE W REFLEX MICROSCOPIC
Bilirubin, UA: NEGATIVE
Glucose, UA: NEGATIVE
Ketones, UA: NEGATIVE
Leukocytes,UA: NEGATIVE
Nitrite, UA: NEGATIVE
Protein,UA: NEGATIVE
RBC, UA: NEGATIVE
Specific Gravity, UA: 1.015 (ref 1.005–1.030)
Urobilinogen, Ur: 0.2 mg/dL (ref 0.2–1.0)
pH, UA: 6.5 (ref 5.0–7.5)

## 2023-02-11 NOTE — Progress Notes (Signed)
Acute Office Visit  Subjective:     Patient ID: Bethany [name], female    DOB: April 06, 1951, 72 y.o.   MRN: [id number]  Patient is in today for pain. Points to pain along her right flank. States that it started after Christmas, around the start of the New Year. Pain starts in her flank and moves toward her abdomen. States that it get better as it comes around. Pain goes away as she deep breathes through it. Lasts 2-3 seconds.  Nothing makes it worse. Does not describe it as a spasm. Endorses that the last two weeks has had episodes of sweating. Denies recent injury or fall. Denies lifting and bending injury.  Is not currently having a flare up but has acyclovir for flare ups of HSV. Happens once a day.   Review of Systems  Musculoskeletal:  Positive for back pain.  As per HPI     Objective:    BP (!) 164/83   Pulse 67   Temp 98 F (36.7 C)   Ht 5' 5"$  (1.651 m)   Wt 203 lb (92.1 kg)   SpO2 96%   BMI 33.78 kg/m    Physical Exam Constitutional:      General: She is not in acute distress.    Appearance: Normal appearance. She is not ill-appearing, toxic-appearing or diaphoretic.  Cardiovascular:     Rate and Rhythm: Normal rate.     Pulses: Normal pulses.     Heart sounds: Normal heart sounds. No murmur heard.    No gallop.  Pulmonary:     Effort: Pulmonary effort is normal. No respiratory distress.     Breath sounds: Normal breath sounds. No stridor. No wheezing, rhonchi or rales.  Abdominal:     Palpations: Abdomen is soft.     Tenderness: There is right CVA tenderness.     Hernia: No hernia is present.     Comments: No suprapubic tenderness.  Skin:    General: Skin is warm.     Capillary Refill: Capillary refill takes less than 2 seconds.  Neurological:     General: No focal deficit present.     Mental Status: She is alert and oriented to person, place, and time. Mental status is at baseline.     Motor: No weakness.  Psychiatric:        Mood and Affect: Mood  normal.        Behavior: Behavior normal.        Thought Content: Thought content normal.        Judgment: Judgment normal.         Assessment & Plan:  There are no diagnoses linked to this encounter. 1. Acute right flank pain UA not concerning for kidney stone or infection. Will communicate results to patient. Reviewed when to seek emergency care with patient. Discussed with patient that shingles could be cause of symptoms and rash has not erupted yet. If symptoms continue, patient instructed to return to clinic for additional work up   - Urinalysis, Routine w reflex microscopic - Urine Culture   The above assessment and management plan was discussed with the patient. The patient verbalized understanding of and has agreed to the management plan using shared-decision making. Patient is aware to call the clinic if they develop any new symptoms or if symptoms fail to improve or worsen. Patient is aware when to return to the clinic for a follow-up visit. Patient educated on when it is appropriate to go to the emergency department.  Return if symptoms worsen or fail to improve.  Donzetta Kohut, DNP-FNP [facility] [address] [place], [place] [address] [phone]

## 2023-02-14 LAB — URINE CULTURE

## 2023-03-03 DIAGNOSIS — I83892 Varicose veins of left lower extremities with other complications: Secondary | ICD-10-CM | POA: Diagnosis not present

## 2023-03-05 ENCOUNTER — Other Ambulatory Visit: Payer: Self-pay | Admitting: Family Medicine

## 2023-03-05 DIAGNOSIS — E782 Mixed hyperlipidemia: Secondary | ICD-10-CM

## 2023-03-06 NOTE — Telephone Encounter (Signed)
Stacks NTBS for 6 mos in March. RF NOT sent to mail order pharmacy

## 2023-03-07 NOTE — Telephone Encounter (Signed)
Pt scheduled appt for 03/08/23

## 2023-03-08 ENCOUNTER — Ambulatory Visit: Payer: Medicare Other | Admitting: Family Medicine

## 2023-03-14 ENCOUNTER — Ambulatory Visit (INDEPENDENT_AMBULATORY_CARE_PROVIDER_SITE_OTHER): Payer: Medicare Other | Admitting: Family Medicine

## 2023-03-14 ENCOUNTER — Encounter: Payer: Self-pay | Admitting: Family Medicine

## 2023-03-14 ENCOUNTER — Ambulatory Visit (INDEPENDENT_AMBULATORY_CARE_PROVIDER_SITE_OTHER): Payer: Medicare Other

## 2023-03-14 VITALS — BP 162/92 | HR 72 | Temp 97.5°F | Ht 65.0 in | Wt 202.8 lb

## 2023-03-14 DIAGNOSIS — I10 Essential (primary) hypertension: Secondary | ICD-10-CM | POA: Diagnosis not present

## 2023-03-14 DIAGNOSIS — M179 Osteoarthritis of knee, unspecified: Secondary | ICD-10-CM | POA: Diagnosis not present

## 2023-03-14 DIAGNOSIS — M25571 Pain in right ankle and joints of right foot: Secondary | ICD-10-CM

## 2023-03-14 DIAGNOSIS — S93401A Sprain of unspecified ligament of right ankle, initial encounter: Secondary | ICD-10-CM | POA: Diagnosis not present

## 2023-03-14 DIAGNOSIS — E782 Mixed hyperlipidemia: Secondary | ICD-10-CM

## 2023-03-14 MED ORDER — DICLOFENAC SODIUM 75 MG PO TBEC: DELAYED_RELEASE_TABLET | ORAL | 3 refills | Status: DC

## 2023-03-14 MED ORDER — CLONAZEPAM 0.5 MG PO TABS: 0.5000 mg | ORAL_TABLET | Freq: Two times a day (BID) | ORAL | 0 refills | Status: DC | PRN

## 2023-03-14 MED ORDER — ROSUVASTATIN CALCIUM 20 MG PO TABS: 20.0000 mg | ORAL_TABLET | Freq: Every day | ORAL | 3 refills | Status: DC

## 2023-03-14 MED ORDER — OLMESARTAN MEDOXOMIL 40 MG PO TABS: 40.0000 mg | ORAL_TABLET | Freq: Every day | ORAL | 3 refills | Status: DC

## 2023-03-14 NOTE — Progress Notes (Signed)
Subjective:  Patient ID: Bethany [name], female    DOB: 1951/09/06  Age: 72 y.o. MRN: [id number]  CC: Medical Management of Chronic Issues   HPI Bethany [name] presents for  presents for  follow-up of hypertension. Patient has no history of headache chest pain or shortness of breath or recent cough. Patient also denies symptoms of TIA such as focal numbness or weakness. Patient denies side effects from medication. States taking it regularly.  in for follow-up of elevated cholesterol. Doing well without complaints on current medication. Denies side effects of statin including myalgia and arthralgia and nausea. Currently no chest pain, shortness of breath or other cardiovascular related symptoms noted.  Pt. Twisted ankle 4 days ago. Stepping down from a chair. Now cannot put weight on it.   Husand died during the night last night. Needs something to help her stay calm.       03/14/2023   10:14 AM 02/11/2023   11:11 AM 10/18/2022   10:06 AM  Depression screen PHQ 2/9  Decreased Interest 0 0 0  Down, Depressed, Hopeless 0 0 0  PHQ - 2 Score 0 0 0  Altered sleeping  0   Tired, decreased energy  0   Change in appetite  0   Feeling bad or failure about yourself   0   Trouble concentrating  0   Moving slowly or fidgety/restless  0   Suicidal thoughts  0   PHQ-9 Score  0   Difficult doing work/chores  Not difficult at all     History Bethany [name] has a past medical history of Allergy (1997), Arthritis, GERD (gastroesophageal reflux disease), Hyperlipidemia, Hypertension, Left leg weakness, Obesity, Pinched nerve, Pneumonia, Prediabetes (08/04/2018), Seasonal allergies, and Sleep apnea.   She has a past surgical history that includes Abdominal hysterectomy; Tubal ligation; phlebectomy; Total knee arthroplasty (Right, 10/22/2013); Ganglion cyst excision; Breast surgery; Total knee arthroplasty (Left, 06/11/2015); Colonoscopy with propofol (N/A, 09/30/2022); and polypectomy (09/30/2022).   Her  family history includes Arthritis in her brother; Diabetes in her sister; Early death in her father; Heart disease in her mother and sister.She reports that she has never smoked. She has never used smokeless tobacco. She reports that she does not drink alcohol and does not use drugs.    ROS Review of Systems  Constitutional: Negative.   HENT: Negative.    Eyes:  Negative for visual disturbance.  Respiratory:  Negative for shortness of breath.   Cardiovascular:  Negative for chest pain.  Gastrointestinal:  Negative for abdominal pain.  Musculoskeletal:  Negative for arthralgias.    Objective:  BP (!) 162/92   Pulse 72   Temp (!) 97.5 F (36.4 C)   Ht 5\' 5"  (1.651 m)   Wt 202 lb 12.8 oz (92 kg)   SpO2 98%   BMI 33.75 kg/m   BP Readings from Last 3 Encounters:  03/14/23 (!) 162/92  02/11/23 (!) 164/83  10/18/22 (!) 147/91    Wt Readings from Last 3 Encounters:  03/14/23 202 lb 12.8 oz (92 kg)  02/11/23 203 lb (92.1 kg)  10/18/22 198 lb 6.4 oz (90 kg)     Physical Exam Constitutional:      General: She is not in acute distress.    Appearance: She is well-developed.  Cardiovascular:     Rate and Rhythm: Normal rate and regular rhythm.  Pulmonary:     Breath sounds: Normal breath sounds.  Musculoskeletal:        General: Tenderness (with 2+ edema  at right ankle. Stable for Drawer, collateral ligaments) present. Normal range of motion.  Skin:    General: Skin is warm and dry.  Neurological:     Mental Status: She is alert and oriented to person, place, and time.       Assessment & Plan:   Bethany [name] was seen today for medical management of chronic issues.  Diagnoses and all orders for this visit:  Acute right ankle pain -     DG Ankle Complete Right; Future  Osteoarthritis of knee, unspecified laterality, unspecified osteoarthritis type -     diclofenac (VOLTAREN) 75 MG EC tablet; TAKE 1 TABLET TWICE DAILY FOR MUSCLE AND JOINT PAIN  Primary hypertension -      olmesartan (BENICAR) 40 MG tablet; Take 1 tablet (40 mg total) by mouth daily. -     CBC with Differential/Platelet -     CMP14+EGFR  Mixed hyperlipidemia -     rosuvastatin (CRESTOR) 20 MG tablet; Take 1 tablet (20 mg total) by mouth daily. -     CBC with Differential/Platelet -     CMP14+EGFR  Other orders -     clonazePAM (KLONOPIN) 0.5 MG tablet; Take 1 tablet (0.5 mg total) by mouth 2 (two) times daily as needed for anxiety.       I have changed Bethany [name]'s olmesartan and rosuvastatin. I am also having her start on clonazePAM. Additionally, I am having her maintain her Ventolin HFA, fluticasone, aspirin EC, meclizine, clobetasol, augmented betamethasone dipropionate, Glycerin-Hypromellose-PEG 400 ([facility] DRY EYE RELIEF OP), pregabalin, furosemide, and diclofenac.  Allergies as of 03/14/2023       Reactions   Hctz [hydrochlorothiazide]    Hair loss        Medication List        Accurate as of March 14, 2023  9:13 PM. If you have any questions, ask your nurse or doctor.          aspirin EC 81 MG tablet Take 1 tablet (81 mg total) by mouth daily. Swallow whole.   augmented betamethasone dipropionate 0.05 % cream Commonly known as: DIPROLENE-AF Apply topically 2 (two) times daily. At affected areas (avoid face and genitals) What changed:  how much to take when to take this reasons to take this   clobetasol 0.05 % external solution Commonly known as: TEMOVATE Apply topically 2 (two) times daily. To scalp What changed:  how much to take when to take this   clonazePAM 0.5 MG tablet Commonly known as: KLONOPIN Take 1 tablet (0.5 mg total) by mouth 2 (two) times daily as needed for anxiety. Started by: Claretta Fraise, MD   [facility] DR[facility] RELIE[facility] OP Place 1 drop into both eyes daily.   diclofenac 75 MG EC tablet Commonly known as: VOLTAREN TAKE 1 TABLET TWICE DAILY FOR MUSCLE AND JOINT PAIN   fluticasone 50 MCG/ACT nasal spray Commonly known as:  FLONASE Place 2 sprays into both nostrils as needed. What changed:  when to take this reasons to take this   furosemide 20 MG tablet Commonly known as: LASIX TAKE 1 TABLET EVERY MORNING   meclizine 25 MG tablet Commonly known as: ANTIVERT TAKE 1 TABLET THREE TIMES DAILY AS NEEDED  FOR  DIZZINESS   olmesartan 40 MG tablet Commonly known as: BENICAR Take 1 tablet (40 mg total) by mouth daily.   pregabalin 50 MG capsule Commonly known as: Lyrica 1 qhs X7 days , then 2 qhs X 7d, then 3 qhs X 7d, then 4 qhs  rosuvastatin 20 MG tablet Commonly known as: CRESTOR Take 1 tablet (20 mg total) by mouth daily.   Ventolin HFA 108 (90 Base) MCG/ACT inhaler Generic drug: albuterol INHALE 2 PUFFS BY MOUTH EVERY 6 HOURS AS NEEDED FOR WHEEZING OR SHORTNESS OF BREATH       ASO Right ankle appled  Follow-up: No follow-ups on file.  Claretta Fraise, M.D.

## 2023-03-15 LAB — CMP14+EGFR
ALT: 16 IU/L (ref 0–32)
AST: 21 IU/L (ref 0–40)
Albumin/Globulin Ratio: 1.4 (ref 1.2–2.2)
Albumin: 4.4 g/dL (ref 3.8–4.8)
Alkaline Phosphatase: 91 IU/L (ref 44–121)
BUN/Creatinine Ratio: 13 (ref 12–28)
BUN: 11 mg/dL (ref 8–27)
Bilirubin Total: 0.8 mg/dL (ref 0.0–1.2)
CO2: 21 mmol/L (ref 20–29)
Calcium: 10.1 mg/dL (ref 8.7–10.3)
Chloride: 107 mmol/L — ABNORMAL HIGH (ref 96–106)
Creatinine, Ser: 0.85 mg/dL (ref 0.57–1.00)
Globulin, Total: 3.1 g/dL (ref 1.5–4.5)
Glucose: 95 mg/dL (ref 70–99)
Potassium: 4.1 mmol/L (ref 3.5–5.2)
Sodium: 145 mmol/L — ABNORMAL HIGH (ref 134–144)
Total Protein: 7.5 g/dL (ref 6.0–8.5)
eGFR: 72 mL/min/{1.73_m2} (ref >59)

## 2023-03-15 LAB — CBC WITH DIFFERENTIAL/PLATELET
Basophils Absolute: 0.1 10*3/uL (ref 0.0–0.2)
Basos: 1 %
EOS (ABSOLUTE): 0.1 10*3/uL (ref 0.0–0.4)
Eos: 1 %
Hematocrit: 38 % (ref 34.0–46.6)
Hemoglobin: 12.5 g/dL (ref 11.1–15.9)
Immature Grans (Abs): 0.1 10*3/uL (ref 0.0–0.1)
Immature Granulocytes: 1 %
Lymphocytes Absolute: 2 10*3/uL (ref 0.7–3.1)
Lymphs: 22 %
MCH: 28.5 pg (ref 26.6–33.0)
MCHC: 32.9 g/dL (ref 31.5–35.7)
MCV: 87 fL (ref 79–97)
Monocytes Absolute: 0.6 10*3/uL (ref 0.1–0.9)
Monocytes: 7 %
Neutrophils Absolute: 6.4 10*3/uL (ref 1.4–7.0)
Neutrophils: 68 %
Platelets: 275 10*3/uL (ref 150–450)
RBC: 4.39 x10E6/uL (ref 3.77–5.28)
RDW: 13 % (ref 11.7–15.4)
WBC: 9.3 10*3/uL (ref 3.4–10.8)

## 2023-03-15 NOTE — Progress Notes (Signed)
Hello Ellington, ° °Your lab result is normal and/or stable.Some minor variations that are not significant are commonly marked abnormal, but do not represent any medical problem for you. ° °Best regards, °Maryum Batterson, M.D.

## 2023-03-30 ENCOUNTER — Other Ambulatory Visit: Payer: Self-pay | Admitting: Family Medicine

## 2023-03-30 ENCOUNTER — Ambulatory Visit: Payer: Medicare Other

## 2023-03-30 VITALS — BP 157/89 | HR 60

## 2023-03-30 DIAGNOSIS — Z0130 Encounter for examination of blood pressure without abnormal findings: Secondary | ICD-10-CM

## 2023-03-30 MED ORDER — AMLODIPINE BESYLATE 5 MG PO TABS: 5.0000 mg | ORAL_TABLET | Freq: Every day | ORAL | 0 refills | Status: DC

## 2023-03-30 NOTE — Progress Notes (Signed)
Patient here today for blood pressure check.   First reading was 140/85, pulse 62.  Patient sat for a while and we rechecked it at 157/89, pulse 60.    Patient has been monitoring blood pressure at home and they have been:  142/90 136/86 144/92 144/86 134/95 140/90 144/86  Patient has a follow up appointment scheduled with you in June.  If you would like her to make any changes in medication before then or for her to be seen sooner, please let her know.

## 2023-04-08 ENCOUNTER — Other Ambulatory Visit: Payer: Self-pay | Admitting: Family Medicine

## 2023-05-03 ENCOUNTER — Other Ambulatory Visit: Payer: Self-pay | Admitting: Family Medicine

## 2023-05-05 ENCOUNTER — Telehealth: Payer: Self-pay | Admitting: Family Medicine

## 2023-05-05 NOTE — Telephone Encounter (Signed)
PATIENT AWARE

## 2023-05-05 NOTE — Telephone Encounter (Signed)
There is nothing I can recommend at this time.

## 2023-05-12 ENCOUNTER — Encounter: Payer: Self-pay | Admitting: Family Medicine

## 2023-05-12 ENCOUNTER — Ambulatory Visit (INDEPENDENT_AMBULATORY_CARE_PROVIDER_SITE_OTHER): Payer: Medicare Other | Admitting: Family Medicine

## 2023-05-12 VITALS — BP 124/80 | HR 60 | Temp 97.2°F | Ht 65.0 in | Wt 200.0 lb

## 2023-05-12 DIAGNOSIS — F411 Generalized anxiety disorder: Secondary | ICD-10-CM

## 2023-05-12 DIAGNOSIS — I10 Essential (primary) hypertension: Secondary | ICD-10-CM | POA: Diagnosis not present

## 2023-05-12 DIAGNOSIS — F4321 Adjustment disorder with depressed mood: Secondary | ICD-10-CM

## 2023-05-12 MED ORDER — AMLODIPINE BESYLATE 5 MG PO TABS: 5.0000 mg | ORAL_TABLET | Freq: Every day | ORAL | 2 refills | Status: DC

## 2023-05-12 MED ORDER — CLONAZEPAM 0.5 MG PO TABS: 0.5000 mg | ORAL_TABLET | Freq: Two times a day (BID) | ORAL | 5 refills | Status: DC | PRN

## 2023-05-12 NOTE — Progress Notes (Signed)
Subjective:  Patient ID: Bethany [name], female    DOB: 1951/12/04  Age: 72 y.o. MRN: [id number]  CC: Medication Refill   HPI Bethany [name] presents for recent sudden death of her husband has cuased a great deal of anxiety. Also settling his affairs.      05/12/2023    9:10 AM 03/14/2023   10:14 AM 02/11/2023   11:11 AM  Depression screen PHQ 2/9  Decreased Interest 0 0 0  Down, Depressed, Hopeless 0 0 0  PHQ - 2 Score 0 0 0  Altered sleeping 0  0  Tired, decreased energy 0  0  Change in appetite 0  0  Feeling bad or failure about yourself  0  0  Trouble concentrating 0  0  Moving slowly or fidgety/restless 0  0  Suicidal thoughts 0  0  PHQ-9 Score 0  0  Difficult doing work/chores Not difficult at all  Not difficult at all    History Bethany [name] has a past medical history of Allergy (1997), Arthritis, GERD (gastroesophageal reflux disease), Hyperlipidemia, Hypertension, Left leg weakness, Obesity, Pinched nerve, Pneumonia, Prediabetes (08/04/2018), Seasonal allergies, and Sleep apnea.   She has a past surgical history that includes Abdominal hysterectomy; Tubal ligation; phlebectomy; Total knee arthroplasty (Right, 10/22/2013); Ganglion cyst excision; Breast surgery; Total knee arthroplasty (Left, 06/11/2015); Colonoscopy with propofol (N/A, 09/30/2022); and polypectomy (09/30/2022).   Her family history includes Arthritis in her brother; Diabetes in her sister; Early death in her father; Heart disease in her mother and sister.She reports that she has never smoked. She has never used smokeless tobacco. She reports that she does not drink alcohol and does not use drugs.    ROS Review of Systems  Constitutional: Negative.   HENT: Negative.    Eyes:  Negative for visual disturbance.  Respiratory:  Negative for shortness of breath.   Cardiovascular:  Negative for chest pain.  Gastrointestinal:  Negative for abdominal pain.  Musculoskeletal:  Negative for arthralgias.   Psychiatric/Behavioral:  Positive for dysphoric mood. The patient is nervous/anxious.     Objective:  BP 124/80   Pulse 60   Temp (!) 97.2 F (36.2 C) (Temporal)   Ht 5\' 5"  (1.651 m)   Wt 200 lb (90.7 kg)   SpO2 98%   BMI 33.28 kg/m   BP Readings from Last 3 Encounters:  05/12/23 124/80  03/30/23 (!) 157/89  03/14/23 (!) 162/92    Wt Readings from Last 3 Encounters:  05/12/23 200 lb (90.7 kg)  03/14/23 202 lb 12.8 oz (92 kg)  02/11/23 203 lb (92.1 kg)     Physical Exam Constitutional:      General: She is not in acute distress.    Appearance: She is well-developed.  Cardiovascular:     Rate and Rhythm: Normal rate and regular rhythm.  Pulmonary:     Breath sounds: Normal breath sounds.  Musculoskeletal:        General: Normal range of motion.  Skin:    General: Skin is warm and dry.  Neurological:     Mental Status: She is alert and oriented to person, place, and time.       Assessment & Plan:   Bethany [name] was seen today for medication refill.  Diagnoses and all orders for this visit:  Primary hypertension  Grieving  GAD (generalized anxiety disorder)  Other orders -     amLODipine (NORVASC) 5 MG tablet; Take 1 tablet (5 mg total) by mouth daily. For blood pressure -  clonazePAM (KLONOPIN) 0.5 MG tablet; Take 1 tablet (0.5 mg total) by mouth 2 (two) times daily as needed for anxiety.       I am having Bethany [name] maintain her Ventolin HFA, fluticasone, meclizine, clobetasol, augmented betamethasone dipropionate, Glycerin-Hypromellose-PEG 400 ([facility] DRY EYE RELIEF OP), pregabalin, furosemide, diclofenac, olmesartan, rosuvastatin, Aspirin Low Dose, amLODipine, and clonazePAM.  Allergies as of 05/12/2023       Reactions   Hctz [hydrochlorothiazide]    Hair loss        Medication List        Accurate as of May 12, 2023  2:28 PM. If you have any questions, ask your nurse or doctor.          amLODipine 5 MG tablet Commonly known as:  NORVASC Take 1 tablet (5 mg total) by mouth daily. For blood pressure   Aspirin Low Dose 81 MG tablet Generic drug: aspirin EC TAKE 1 TABLET EVERY DAY. SWALLOW WHOLE   augmented betamethasone dipropionate 0.05 % cream Commonly known as: DIPROLENE-AF Apply topically 2 (two) times daily. At affected areas (avoid face and genitals) What changed:  how much to take when to take this reasons to take this   clobetasol 0.05 % external solution Commonly known as: TEMOVATE Apply topically 2 (two) times daily. To scalp What changed:  how much to take when to take this   clonazePAM 0.5 MG tablet Commonly known as: KLONOPIN Take 1 tablet (0.5 mg total) by mouth 2 (two) times daily as needed for anxiety.   [facility] DR[facility] RELIEF OP Place 1 drop into both eyes daily.   diclofenac 75 MG EC tablet Commonly known as: VOLTAREN TAKE 1 TABLET TWICE DAILY FOR MUSCLE AND JOINT PAIN   fluticasone 50 MCG/ACT nasal spray Commonly known as: FLONASE Place 2 sprays into both nostrils as needed. What changed:  when to take this reasons to take this   furosemide 20 MG tablet Commonly known as: LASIX TAKE 1 TABLET EVERY MORNING   meclizine 25 MG tablet Commonly known as: ANTIVERT TAKE 1 TABLET THREE TIMES DAILY AS NEEDED  FOR  DIZZINESS   olmesartan 40 MG tablet Commonly known as: BENICAR Take 1 tablet (40 mg total) by mouth daily.   pregabalin 50 MG capsule Commonly known as: Lyrica 1 qhs X7 days , then 2 qhs X 7d, then 3 qhs X 7d, then 4 qhs   rosuvastatin 20 MG tablet Commonly known as: CRESTOR Take 1 tablet (20 mg total) by mouth daily.   Ventolin HFA 108 (90 Base) MCG/ACT inhaler Generic drug: albuterol INHALE 2 PUFFS BY MOUTH EVERY 6 HOURS AS NEEDED FOR WHEEZING OR SHORTNESS OF BREATH         Follow-up: No follow-ups on file.  Mechele Claude, M.D.

## 2023-05-17 DIAGNOSIS — M9904 Segmental and somatic dysfunction of sacral region: Secondary | ICD-10-CM | POA: Diagnosis not present

## 2023-05-17 DIAGNOSIS — M9901 Segmental and somatic dysfunction of cervical region: Secondary | ICD-10-CM | POA: Diagnosis not present

## 2023-05-17 DIAGNOSIS — M62830 Muscle spasm of back: Secondary | ICD-10-CM | POA: Diagnosis not present

## 2023-05-17 DIAGNOSIS — M9903 Segmental and somatic dysfunction of lumbar region: Secondary | ICD-10-CM | POA: Diagnosis not present

## 2023-05-18 DIAGNOSIS — M9904 Segmental and somatic dysfunction of sacral region: Secondary | ICD-10-CM | POA: Diagnosis not present

## 2023-05-18 DIAGNOSIS — M9903 Segmental and somatic dysfunction of lumbar region: Secondary | ICD-10-CM | POA: Diagnosis not present

## 2023-05-18 DIAGNOSIS — M62830 Muscle spasm of back: Secondary | ICD-10-CM | POA: Diagnosis not present

## 2023-05-18 DIAGNOSIS — M9901 Segmental and somatic dysfunction of cervical region: Secondary | ICD-10-CM | POA: Diagnosis not present

## 2023-05-19 DIAGNOSIS — M62830 Muscle spasm of back: Secondary | ICD-10-CM | POA: Diagnosis not present

## 2023-05-19 DIAGNOSIS — M9901 Segmental and somatic dysfunction of cervical region: Secondary | ICD-10-CM | POA: Diagnosis not present

## 2023-05-19 DIAGNOSIS — M9904 Segmental and somatic dysfunction of sacral region: Secondary | ICD-10-CM | POA: Diagnosis not present

## 2023-05-19 DIAGNOSIS — M9903 Segmental and somatic dysfunction of lumbar region: Secondary | ICD-10-CM | POA: Diagnosis not present

## 2023-05-26 DIAGNOSIS — M9901 Segmental and somatic dysfunction of cervical region: Secondary | ICD-10-CM | POA: Diagnosis not present

## 2023-05-26 DIAGNOSIS — M9903 Segmental and somatic dysfunction of lumbar region: Secondary | ICD-10-CM | POA: Diagnosis not present

## 2023-05-26 DIAGNOSIS — M9904 Segmental and somatic dysfunction of sacral region: Secondary | ICD-10-CM | POA: Diagnosis not present

## 2023-05-26 DIAGNOSIS — M62830 Muscle spasm of back: Secondary | ICD-10-CM | POA: Diagnosis not present

## 2023-06-02 DIAGNOSIS — M9904 Segmental and somatic dysfunction of sacral region: Secondary | ICD-10-CM | POA: Diagnosis not present

## 2023-06-02 DIAGNOSIS — M9901 Segmental and somatic dysfunction of cervical region: Secondary | ICD-10-CM | POA: Diagnosis not present

## 2023-06-02 DIAGNOSIS — M62830 Muscle spasm of back: Secondary | ICD-10-CM | POA: Diagnosis not present

## 2023-06-02 DIAGNOSIS — M9903 Segmental and somatic dysfunction of lumbar region: Secondary | ICD-10-CM | POA: Diagnosis not present

## 2023-06-14 ENCOUNTER — Ambulatory Visit (INDEPENDENT_AMBULATORY_CARE_PROVIDER_SITE_OTHER): Payer: Medicare Other

## 2023-06-14 ENCOUNTER — Ambulatory Visit: Payer: Medicare Other | Admitting: Family Medicine

## 2023-06-14 VITALS — Ht 65.0 in | Wt 200.0 lb

## 2023-06-14 DIAGNOSIS — Z780 Asymptomatic menopausal state: Secondary | ICD-10-CM | POA: Diagnosis not present

## 2023-06-14 DIAGNOSIS — Z0000 Encounter for general adult medical examination without abnormal findings: Secondary | ICD-10-CM | POA: Diagnosis not present

## 2023-06-14 NOTE — Progress Notes (Addendum)
Subjective:   Bethany [name] is a 72 y.o. female who presents for Medicare Annual (Subsequent) preventive examination.  I connected with  Bethany [name] on 06/14/23 by a audio enabled telemedicine application and verified that I am speaking with the correct person using two identifiers.  Patient Location: Home  Provider Location: Home Office  I discussed the limitations of evaluation and management by telemedicine. The patient expressed understanding and agreed to proceed.  Patient Medicare AWV questionnaire was completed by the patient on 06/11/2023; I have confirmed that all information answered by patient is correct and no changes since this date.  Review of Systems     Cardiac Risk Factors include: advanced age (>1men, >55 women);dyslipidemia;hypertension     Objective:    Today's Vitals   06/14/23 0815  Weight: 200 lb (90.7 kg)  Height: 5\' 5"  (1.651 m)   Body mass index is 33.28 kg/m.     06/14/2023    8:19 AM 09/30/2022    6:46 AM 09/28/2022    3:03 PM 06/10/2022    8:29 AM 06/09/2021    8:37 AM 06/06/2020    8:46 AM 06/05/2019    9:11 AM  Advanced Directives  Does Patient Have a Medical Advance Directive? Yes Yes Yes Yes Yes Yes Yes  Type of Estate agent of [place];Living will Healthcare Power of [place];Living will  Healthcare Power of [place];Living will Healthcare Power of [place];Living will Living will Healthcare Power of [place];Living will  Does patient want to make changes to medical advance directive?      No - Patient declined No - Patient declined  Copy of Healthcare Power of Attorney in Chart? No - copy requested   No - copy requested No - copy requested  No - copy requested    Current Medications (verified) Outpatient Encounter Medications as of 06/14/2023  Medication Sig   amLODipine (NORVASC) 5 MG tablet Take 1 tablet (5 mg total) by mouth daily. For blood pressure   ASPIRIN LOW DOSE 81 MG tablet TAKE 1 TABLET EVERY DAY.  SWALLOW WHOLE   augmented betamethasone dipropionate (DIPROLENE-AF) 0.05 % cream Apply topically 2 (two) times daily. At affected areas (avoid face and genitals) (Patient taking differently: Apply 1 application  topically 2 (two) times daily as needed (rash). At affected areas (avoid face and genitals))   clobetasol (TEMOVATE) 0.05 % external solution Apply topically 2 (two) times daily. To scalp (Patient taking differently: Apply 1 Application topically daily. To scalp)   clonazePAM (KLONOPIN) 0.5 MG tablet Take 1 tablet (0.5 mg total) by mouth 2 (two) times daily as needed for anxiety.   diclofenac (VOLTAREN) 75 MG EC tablet TAKE 1 TABLET TWICE DAILY FOR MUSCLE AND JOINT PAIN   fluticasone (FLONASE) 50 MCG/ACT nasal spray Place 2 sprays into both nostrils as needed. (Patient taking differently: Place 2 sprays into both nostrils daily as needed for allergies.)   furosemide (LASIX) 20 MG tablet TAKE 1 TABLET EVERY MORNING   Glycerin-Hypromellose-PEG 400 ([facility] DRY EYE RELIEF OP) Place 1 drop into both eyes daily.   meclizine (ANTIVERT) 25 MG tablet TAKE 1 TABLET THREE TIMES DAILY AS NEEDED  FOR  DIZZINESS   olmesartan (BENICAR) 40 MG tablet Take 1 tablet (40 mg total) by mouth daily.   pregabalin (LYRICA) 50 MG capsule 1 qhs X7 days , then 2 qhs X 7d, then 3 qhs X 7d, then 4 qhs   rosuvastatin (CRESTOR) 20 MG tablet Take 1 tablet (20 mg total) by mouth daily.  VENTOLIN HFA 108 (90 Base) MCG/ACT inhaler INHALE 2 PUFFS BY MOUTH EVERY 6 HOURS AS NEEDED FOR WHEEZING OR SHORTNESS OF BREATH   No facility-administered encounter medications on file as of 06/14/2023.    Allergies (verified) Hctz [hydrochlorothiazide]   History: Past Medical History:  Diagnosis Date   Allergy 1997   knees   Arthritis    GERD (gastroesophageal reflux disease)    Hyperlipidemia    Hypertension    Left leg weakness    Obesity    Pinched nerve    Pneumonia    1 month ago - rsolved   Prediabetes 08/04/2018    Seasonal allergies    Sleep apnea    cpap   Past Surgical History:  Procedure Laterality Date   ABDOMINAL HYSTERECTOMY     BREAST SURGERY     fibroid cysts removed   COLONOSCOPY WITH PROPOFOL N/A 09/30/2022   Procedure: COLONOSCOPY WITH PROPOFOL;  Surgeon: Lanelle Bal, DO;  Location: [facility];  Service: Endoscopy;  Laterality: N/A;  8:15am, asa 2   GANGLION CYST EXCISION     left   phlebectomy     POLYPECTOMY  09/30/2022   Procedure: POLYPECTOMY;  Surgeon: Lanelle Bal, DO;  Location: [facility];  Service: Endoscopy;;   TOTAL KNEE ARTHROPLASTY Right 10/22/2013   Procedure: RIGHT TOTAL KNEE ARTHROPLASTY;  Surgeon: Loanne Drilling, MD;  Location: [facility];  Service: Orthopedics;  Laterality: Right;   TOTAL KNEE ARTHROPLASTY Left 06/11/2015   Procedure: LEFT TOTAL KNEE ARTHROPLASTY;  Surgeon: Ollen Gross, MD;  Location: [facility];  Service: Orthopedics;  Laterality: Left;   TUBAL LIGATION     Family History  Problem Relation Age of Onset   Heart disease Mother    Early death Father        Shooting   Diabetes Sister    Arthritis Brother        knees   Heart disease Sister        tumor in the heart   Social History   Socioeconomic History   Marital status: Married    Spouse name: Leonette Most   Number of children: 4   Years of education: 12   Highest education level: High school graduate  Occupational History   Occupation: retired    Associate Professor: [employer]  Tobacco Use   Smoking status: Never   Smokeless tobacco: Never  Vaping Use   Vaping Use: Never used  Substance and Sexual Activity   Alcohol use: No   Drug use: No   Sexual activity: Not Currently    Comment: husband not able to have  sex  Other Topics Concern   Not on file  Social History Narrative   Lives home with her husband. Had 4 children, one passed away. Children live nearby, daughter lives next street over   Social Determinants of Health   Financial Resource Strain: Low Risk  (06/14/2023)    Overall Financial Resource Strain (CARDIA)    Difficulty of Paying Living Expenses: Not hard at all  Food Insecurity: No Food Insecurity (06/14/2023)   Hunger Vital Sign    Worried About Running Out of Food in the Last Year: Never true    Ran Out of Food in the Last Year: Never true  Transportation Needs: No Transportation Needs (06/14/2023)   PRAPARE - Administrator, Civil Service (Medical): No    Lack of Transportation (Non-Medical): No  Physical Activity: Insufficiently Active (06/14/2023)   Exercise Vital Sign  Days of Exercise per Week: 3 days    Minutes of Exercise per Session: 30 min  Stress: No Stress Concern Present (06/14/2023)   [employer] of Occupational Health - Occupational Stress Questionnaire    Feeling of Stress : Not at all  Social Connections: Moderately Isolated (06/14/2023)   Social Connection and Isolation Panel [NHANES]    Frequency of Communication with Friends and Family: More than three times a week    Frequency of Social Gatherings with Friends and Family: More than three times a week    Attends Religious Services: More than 4 times per year    Active Member of [employer] or Organizations: No    Attends Banker Meetings: Never    Marital Status: Widowed    Tobacco Counseling Counseling given: Not Answered   Clinical Intake:  Pre-visit preparation completed: Yes  Pain : No/denies pain     Nutritional Risks: None Diabetes: No  How often do you need to have someone help you when you read instructions, pamphlets, or other written materials from your doctor or pharmacy?: 1 - Never  Interpreter Needed?: No  Information entered by :: Renie Ora, LPN   Activities of Daily Living    06/14/2023    8:20 AM  In your present state of health, do you have any difficulty performing the following activities:  Hearing? 0  Vision? 0  Difficulty concentrating or making decisions? 0  Walking or climbing stairs? 0  Dressing or  bathing? 0  Doing errands, shopping? 0  Preparing Food and eating ? N  Using the Toilet? N  In the past six months, have you accidently leaked urine? N  Do you have problems with loss of bowel control? N  Managing your Medications? N  Managing your Finances? N  Housekeeping or managing your Housekeeping? N    Patient Care Team: Mechele Claude, MD as PCP - General (Family Medicine) Ollen Gross, MD as Consulting Physician (Orthopedic Surgery) O'Neal, Ronnald Ramp, MD as Consulting Physician (Cardiology) Marcelino Duster, MD as Referring Physician (Dermatology) Delora Fuel, OD (Optometry)  Indicate any recent Medical Services you may have received from other than [facility] providers in the past year (date may be approximate).     Assessment:   This is a routine wellness examination for Susen.  Hearing/Vision screen Vision Screening - Comments:: Wears rx glasses - up to date with routine eye exams with  Dr.[name]   Dietary issues and exercise activities discussed:     Goals Addressed             This Visit's Progress    DIET - INCREASE WATER INTAKE         Depression Screen    06/14/2023    8:18 AM 05/12/2023    9:10 AM 03/14/2023   10:14 AM 02/11/2023   11:11 AM 10/18/2022   10:06 AM 09/16/2022    8:31 AM 08/10/2022    9:46 AM  PHQ 2/9 Scores  PHQ - 2 Score 0 0 0 0 0 0 0  PHQ- 9 Score 0 0  0       Fall Risk    06/14/2023    8:16 AM 05/12/2023    9:11 AM 03/14/2023   10:14 AM 02/11/2023   11:11 AM 10/18/2022   10:06 AM  Fall Risk   Falls in the past year? 0 0 1 0 0  Number falls in past yr: 0 0 0 0   Injury with Fall? 0 0 1 0  Risk for fall due to : No Fall Risks No Fall Risks History of fall(s) No Fall Risks   Follow up Falls prevention discussed Falls evaluation completed  Falls evaluation completed     MEDICARE RISK AT HOME:  Medicare Risk at Home - 06/14/23 0932     Any stairs in or around the home? No    If so, are there any without handrails?  No    Home free of loose throw rugs in walkways, pet beds, electrical cords, etc? No    Adequate lighting in your home to reduce risk of falls? Yes    Use of a cane, walker or w/c? Yes    Grab bars in the bathroom? Yes    Shower chair or bench in shower? Yes    Elevated toilet seat or a handicapped toilet? Yes                  06/14/2023    8:20 AM 06/10/2022    8:30 AM 06/06/2020    8:49 AM 06/05/2019    9:12 AM  6CIT Screen  What Year? 0 points 0 points 0 points 0 points  What month? 0 points 0 points 0 points 0 points  What time? 0 points 0 points 0 points 0 points  Count back from 20 0 points 0 points 0 points 0 points  Months in reverse 0 points 0 points 0 points 0 points  Repeat phrase 0 points 0 points 0 points 0 points  Total Score 0 points 0 points 0 points 0 points    Immunizations Immunization History  Administered Date(s) Administered   Fluad Quad(high Dose 65+) 10/24/2019, 10/20/2020, 09/15/2021, 09/16/2022   Influenza, High Dose Seasonal PF 10/08/2016, 09/21/2017, 10/02/2018   Influenza,inj,Quad PF,6+ Mos 09/17/2013, 10/01/2015   Influenza-Unspecified 09/26/2014   Moderna Sars-Covid-2 Vaccination 02/26/2020, 03/25/2020, 11/14/2020, 04/01/2021   Pneumococcal Conjugate-13 10/01/2015   Pneumococcal Polysaccharide-23 09/21/2017   Tdap 09/03/2016   Zoster Recombinat (Shingrix) 03/16/2022, 05/20/2022    TDAP status: Up to date  Flu Vaccine status: Up to date  Pneumococcal vaccine status: Up to date  Covid-19 vaccine status: Completed vaccines  Qualifies for Shingles Vaccine? Yes   Zostavax completed Yes   Shingrix Completed?: Yes  Screening Tests Health Maintenance  Topic Date Due   COVID-19 Vaccine (5 - 2023-24 season) 08/27/2022   DEXA SCAN  06/24/2023   INFLUENZA VACCINE  07/28/2023   MAMMOGRAM  11/18/2023   Medicare Annual Wellness (AWV)  06/13/2024   Fecal DNA (Cologuard)  07/26/2025   DTaP/Tdap/Td (2 - Td or Tdap) 09/03/2026   Pneumonia  Vaccine 51+ Years old  Completed   Hepatitis C Screening  Completed   Zoster Vaccines- Shingrix  Completed   HPV VACCINES  Aged Out   Colonoscopy  Discontinued    Health Maintenance  Health Maintenance Due  Topic Date Due   COVID-19 Vaccine (5 - 2023-24 season) 08/27/2022    Colorectal cancer screening: Type of screening: Cologuard. Completed 07/26/2022. Repeat every 3 years  Mammogram status: Completed 11/17/2022. Repeat every year  Bone Density status: Completed 06/23/2018. Results reflect: Bone density results: OSTEOPENIA. Repeat every 5 years.  Lung Cancer Screening: (Low Dose CT Chest recommended if Age 43-80 years, 20 pack-year currently smoking OR have quit w/in 15years.) does not qualify.   Lung Cancer Screening Referral: n/a  Additional Screening:  Hepatitis C Screening: does not qualify; Completed 09/03/2016  Vision Screening: Recommended annual ophthalmology exams for early detection of glaucoma and other disorders of  the eye. Is the patient up to date with their annual eye exam?  Yes  Who is the provider or what is the name of the office in which the patient attends annual eye exams? Dr.[name]  If pt is not established with a provider, would they like to be referred to a provider to establish care? No .   Dental Screening: Recommended annual dental exams for proper oral hygiene  Diabetic Foot Exam: Diabetic Foot Exam: Overdue, Pt has been advised about the importance in completing this exam. Pt is scheduled for diabetic foot exam on next office visit .  Community Resource Referral / Chr onic Care Management: CRR required this visit?  No   CCM required this visit?  No     Plan:     I have personally reviewed and noted the following in the patient's chart:   Medical and social history Use of alcohol, tobacco or illicit drugs  Current medications and supplements including opioid prescriptions. Patient is not currently taking opioid  prescriptions. Functional ability and status Nutritional status Physical activity Advanced directives List of other physicians Hospitalizations, surgeries, and ER visits in previous 12 months Vitals Screenings to include cognitive, depression, and falls Referrals and appointments  In addition, I have reviewed and discussed with patient certain preventive protocols, quality metrics, and best practice recommendations. A written personalized care plan for preventive services as well as general preventive health recommendations were provided to patient.     Lorrene Reid, LPN   05/30/5408   After Visit Summary: (MyChart) Due to this being a telephonic visit, the after visit summary with patients personalized plan was offered to patient via MyChart   Nurse Notes: none

## 2023-06-14 NOTE — Patient Instructions (Signed)
Bethany [name] , Thank you for taking time to come for your Medicare Wellness Visit. I appreciate your ongoing commitment to your health goals. Please review the following plan we discussed and let me know if I can assist you in the future.   These are the goals we discussed:  Goals      DIET - INCREASE WATER INTAKE     Exercise 150 min/wk Moderate Activity     Patient Stated     06/10/22 - hopes to get her home remodeled/ updated over the next year     Plan meals     Once per week plan menu for the rest of week - include mostly healthy food options.      Weight (lb) < 200 lb (90.7 kg)        This is a list of the screening recommended for you and due dates:  Health Maintenance  Topic Date Due   COVID-19 Vaccine (5 - 2023-24 season) 08/27/2022   DEXA scan (bone density measurement)  06/24/2023   Flu Shot  07/28/2023   Mammogram  11/18/2023   Medicare Annual Wellness Visit  06/13/2024   Cologuard (Stool DNA test)  07/26/2025   DTaP/Tdap/Td vaccine (2 - Td or Tdap) 09/03/2026   Pneumonia Vaccine  Completed   Hepatitis C Screening  Completed   Zoster (Shingles) Vaccine  Completed   HPV Vaccine  Aged Out   Colon Cancer Screening  Discontinued    Advanced directives: Please bring a copy of your health care power of attorney and living will to the office to be added to your chart at your convenience.   Conditions/risks identified: Aim for 30 minutes of exercise or brisk walking, 6-8 glasses of water, and 5 servings of fruits and vegetables each day.   Next appointment: Follow up in one year for your annual wellness visit    Preventive Care 65 Years and Older, Female Preventive care refers to lifestyle choices and visits with your health care provider that can promote health and wellness. What does preventive care include? A yearly physical exam. This is also called an annual well check. Dental exams once or twice a year. Routine eye exams. Ask your health care provider how often  you should have your eyes checked. Personal lifestyle choices, including: Daily care of your teeth and gums. Regular physical activity. Eating a healthy diet. Avoiding tobacco and drug use. Limiting alcohol use. Practicing safe sex. Taking low-dose aspirin every day. Taking vitamin and mineral supplements as recommended by your health care provider. What happens during an annual well check? The services and screenings done by your health care provider during your annual well check will depend on your age, overall health, lifestyle risk factors, and family history of disease. Counseling  Your health care provider may ask you questions about your: Alcohol use. Tobacco use. Drug use. Emotional well-being. Home and relationship well-being. Sexual activity. Eating habits. History of falls. Memory and ability to understand (cognition). Work and work Astronomer. Reproductive health. Screening  You may have the following tests or measurements: Height, weight, and BMI. Blood pressure. Lipid and cholesterol levels. These may be checked every 5 years, or more frequently if you are over 46 years old. Skin check. Lung cancer screening. You may have this screening every year starting at age 35 if you have a 30-pack-year history of smoking and currently smoke or have quit within the past 15 years. Fecal occult blood test (FOBT) of the stool. You may have this  test every year starting at age 83. Flexible sigmoidoscopy or colonoscopy. You may have a sigmoidoscopy every 5 years or a colonoscopy every 10 years starting at age 58. Hepatitis C blood test. Hepatitis B blood test. Sexually transmitted disease (STD) testing. Diabetes screening. This is done by checking your blood sugar (glucose) after you have not eaten for a while (fasting). You may have this done every 1-3 years. Bone density scan. This is done to screen for osteoporosis. You may have this done starting at age 59. Mammogram. This  may be done every 1-2 years. Talk to your health care provider about how often you should have regular mammograms. Talk with your health care provider about your test results, treatment options, and if necessary, the need for more tests. Vaccines  Your health care provider may recommend certain vaccines, such as: Influenza vaccine. This is recommended every year. Tetanus, diphtheria, and acellular pertussis (Tdap, Td) vaccine. You may need a Td booster every 10 years. Zoster vaccine. You may need this after age 24. Pneumococcal 13-valent conjugate (PCV13) vaccine. One dose is recommended after age 73. Pneumococcal polysaccharide (PPSV23) vaccine. One dose is recommended after age 76. Talk to your health care provider about which screenings and vaccines you need and how often you need them. This information is not intended to replace advice given to you by your health care provider. Make sure you discuss any questions you have with your health care provider. Document Released: 01/09/2016 Document Revised: 09/01/2016 Document Reviewed: 10/14/2015 Elsevier Interactive Patient Education  2017 [employer].  Fall Prevention in the Home Falls can cause injuries. They can happen to people of all ages. There are many things you can do to make your home safe and to help prevent falls. What can I do on the outside of my home? Regularly fix the edges of walkways and driveways and fix any cracks. Remove anything that might make you trip as you walk through a door, such as a raised step or threshold. Trim any bushes or trees on the path to your home. Use bright outdoor lighting. Clear any walking paths of anything that might make someone trip, such as rocks or tools. Regularly check to see if handrails are loose or broken. Make sure that both sides of any steps have handrails. Any raised decks and porches should have guardrails on the edges. Have any leaves, snow, or ice cleared regularly. Use sand or  salt on walking paths during winter. Clean up any spills in your garage right away. This includes oil or grease spills. What can I do in the bathroom? Use night lights. Install grab bars by the toilet and in the tub and shower. Do not use towel bars as grab bars. Use non-skid mats or decals in the tub or shower. If you need to sit down in the shower, use a plastic, non-slip stool. Keep the floor dry. Clean up any water that spills on the floor as soon as it happens. Remove soap buildup in the tub or shower regularly. Attach bath mats securely with double-sided non-slip rug tape. Do not have throw rugs and other things on the floor that can make you trip. What can I do in the bedroom? Use night lights. Make sure that you have a light by your bed that is easy to reach. Do not use any sheets or blankets that are too big for your bed. They should not hang down onto the floor. Have a firm chair that has side arms. You can  use this for support while you get dressed. Do not have throw rugs and other things on the floor that can make you trip. What can I do in the kitchen? Clean up any spills right away. Avoid walking on wet floors. Keep items that you use a lot in easy-to-reach places. If you need to reach something above you, use a strong step stool that has a grab bar. Keep electrical cords out of the way. Do not use floor polish or wax that makes floors slippery. If you must use wax, use non-skid floor wax. Do not have throw rugs and other things on the floor that can make you trip. What can I do with my stairs? Do not leave any items on the stairs. Make sure that there are handrails on both sides of the stairs and use them. Fix handrails that are broken or loose. Make sure that handrails are as long as the stairways. Check any carpeting to make sure that it is firmly attached to the stairs. Fix any carpet that is loose or worn. Avoid having throw rugs at the top or bottom of the stairs. If  you do have throw rugs, attach them to the floor with carpet tape. Make sure that you have a light switch at the top of the stairs and the bottom of the stairs. If you do not have them, ask someone to add them for you. What else can I do to help prevent falls? Wear shoes that: Do not have high heels. Have rubber bottoms. Are comfortable and fit you well. Are closed at the toe. Do not wear sandals. If you use a stepladder: Make sure that it is fully opened. Do not climb a closed stepladder. Make sure that both sides of the stepladder are locked into place. Ask someone to hold it for you, if possible. Clearly mark and make sure that you can see: Any grab bars or handrails. First and last steps. Where the edge of each step is. Use tools that help you move around (mobility aids) if they are needed. These include: Canes. Walkers. Scooters. Crutches. Turn on the lights when you go into a dark area. Replace any light bulbs as soon as they burn out. Set up your furniture so you have a clear path. Avoid moving your furniture around. If any of your floors are uneven, fix them. If there are any pets around you, be aware of where they are. Review your medicines with your doctor. Some medicines can make you feel dizzy. This can increase your chance of falling. Ask your doctor what other things that you can do to help prevent falls. This information is not intended to replace advice given to you by your health care provider. Make sure you discuss any questions you have with your health care provider. Document Released: 10/09/2009 Document Revised: 05/20/2016 Document Reviewed: 01/17/2015 Elsevier Interactive Patient Education  2017 [employer].

## 2023-06-15 ENCOUNTER — Ambulatory Visit: Payer: Medicare Other | Admitting: Family Medicine

## 2023-06-29 ENCOUNTER — Encounter: Payer: Self-pay | Admitting: Family Medicine

## 2023-08-22 DIAGNOSIS — M9904 Segmental and somatic dysfunction of sacral region: Secondary | ICD-10-CM | POA: Diagnosis not present

## 2023-08-22 DIAGNOSIS — M9903 Segmental and somatic dysfunction of lumbar region: Secondary | ICD-10-CM | POA: Diagnosis not present

## 2023-08-22 DIAGNOSIS — M9901 Segmental and somatic dysfunction of cervical region: Secondary | ICD-10-CM | POA: Diagnosis not present

## 2023-08-22 DIAGNOSIS — M62830 Muscle spasm of back: Secondary | ICD-10-CM | POA: Diagnosis not present

## 2023-08-24 DIAGNOSIS — M9903 Segmental and somatic dysfunction of lumbar region: Secondary | ICD-10-CM | POA: Diagnosis not present

## 2023-08-24 DIAGNOSIS — M62830 Muscle spasm of back: Secondary | ICD-10-CM | POA: Diagnosis not present

## 2023-08-24 DIAGNOSIS — M9904 Segmental and somatic dysfunction of sacral region: Secondary | ICD-10-CM | POA: Diagnosis not present

## 2023-08-24 DIAGNOSIS — M9901 Segmental and somatic dysfunction of cervical region: Secondary | ICD-10-CM | POA: Diagnosis not present

## 2023-08-25 DIAGNOSIS — M9903 Segmental and somatic dysfunction of lumbar region: Secondary | ICD-10-CM | POA: Diagnosis not present

## 2023-08-25 DIAGNOSIS — M9901 Segmental and somatic dysfunction of cervical region: Secondary | ICD-10-CM | POA: Diagnosis not present

## 2023-08-25 DIAGNOSIS — M62830 Muscle spasm of back: Secondary | ICD-10-CM | POA: Diagnosis not present

## 2023-08-25 DIAGNOSIS — M9904 Segmental and somatic dysfunction of sacral region: Secondary | ICD-10-CM | POA: Diagnosis not present

## 2023-08-30 DIAGNOSIS — M62830 Muscle spasm of back: Secondary | ICD-10-CM | POA: Diagnosis not present

## 2023-08-30 DIAGNOSIS — M9901 Segmental and somatic dysfunction of cervical region: Secondary | ICD-10-CM | POA: Diagnosis not present

## 2023-08-30 DIAGNOSIS — M9903 Segmental and somatic dysfunction of lumbar region: Secondary | ICD-10-CM | POA: Diagnosis not present

## 2023-08-30 DIAGNOSIS — M9904 Segmental and somatic dysfunction of sacral region: Secondary | ICD-10-CM | POA: Diagnosis not present

## 2023-09-01 DIAGNOSIS — M9901 Segmental and somatic dysfunction of cervical region: Secondary | ICD-10-CM | POA: Diagnosis not present

## 2023-09-01 DIAGNOSIS — M62830 Muscle spasm of back: Secondary | ICD-10-CM | POA: Diagnosis not present

## 2023-09-01 DIAGNOSIS — M9903 Segmental and somatic dysfunction of lumbar region: Secondary | ICD-10-CM | POA: Diagnosis not present

## 2023-09-01 DIAGNOSIS — M9904 Segmental and somatic dysfunction of sacral region: Secondary | ICD-10-CM | POA: Diagnosis not present

## 2023-09-08 DIAGNOSIS — M62830 Muscle spasm of back: Secondary | ICD-10-CM | POA: Diagnosis not present

## 2023-09-08 DIAGNOSIS — M9901 Segmental and somatic dysfunction of cervical region: Secondary | ICD-10-CM | POA: Diagnosis not present

## 2023-09-08 DIAGNOSIS — M9904 Segmental and somatic dysfunction of sacral region: Secondary | ICD-10-CM | POA: Diagnosis not present

## 2023-09-08 DIAGNOSIS — M9903 Segmental and somatic dysfunction of lumbar region: Secondary | ICD-10-CM | POA: Diagnosis not present

## 2023-09-15 DIAGNOSIS — M9901 Segmental and somatic dysfunction of cervical region: Secondary | ICD-10-CM | POA: Diagnosis not present

## 2023-09-15 DIAGNOSIS — M9903 Segmental and somatic dysfunction of lumbar region: Secondary | ICD-10-CM | POA: Diagnosis not present

## 2023-09-15 DIAGNOSIS — M9904 Segmental and somatic dysfunction of sacral region: Secondary | ICD-10-CM | POA: Diagnosis not present

## 2023-09-15 DIAGNOSIS — M62830 Muscle spasm of back: Secondary | ICD-10-CM | POA: Diagnosis not present

## 2023-09-21 DIAGNOSIS — M9901 Segmental and somatic dysfunction of cervical region: Secondary | ICD-10-CM | POA: Diagnosis not present

## 2023-09-21 DIAGNOSIS — M9904 Segmental and somatic dysfunction of sacral region: Secondary | ICD-10-CM | POA: Diagnosis not present

## 2023-09-21 DIAGNOSIS — M62830 Muscle spasm of back: Secondary | ICD-10-CM | POA: Diagnosis not present

## 2023-09-21 DIAGNOSIS — M9903 Segmental and somatic dysfunction of lumbar region: Secondary | ICD-10-CM | POA: Diagnosis not present

## 2023-09-28 DIAGNOSIS — M62830 Muscle spasm of back: Secondary | ICD-10-CM | POA: Diagnosis not present

## 2023-09-28 DIAGNOSIS — M9904 Segmental and somatic dysfunction of sacral region: Secondary | ICD-10-CM | POA: Diagnosis not present

## 2023-09-28 DIAGNOSIS — M9901 Segmental and somatic dysfunction of cervical region: Secondary | ICD-10-CM | POA: Diagnosis not present

## 2023-09-28 DIAGNOSIS — M9903 Segmental and somatic dysfunction of lumbar region: Secondary | ICD-10-CM | POA: Diagnosis not present

## 2023-10-05 DIAGNOSIS — M9904 Segmental and somatic dysfunction of sacral region: Secondary | ICD-10-CM | POA: Diagnosis not present

## 2023-10-05 DIAGNOSIS — M9903 Segmental and somatic dysfunction of lumbar region: Secondary | ICD-10-CM | POA: Diagnosis not present

## 2023-10-05 DIAGNOSIS — M62830 Muscle spasm of back: Secondary | ICD-10-CM | POA: Diagnosis not present

## 2023-10-05 DIAGNOSIS — M9901 Segmental and somatic dysfunction of cervical region: Secondary | ICD-10-CM | POA: Diagnosis not present

## 2023-10-06 DIAGNOSIS — M9901 Segmental and somatic dysfunction of cervical region: Secondary | ICD-10-CM | POA: Diagnosis not present

## 2023-10-06 DIAGNOSIS — M9903 Segmental and somatic dysfunction of lumbar region: Secondary | ICD-10-CM | POA: Diagnosis not present

## 2023-10-06 DIAGNOSIS — M62830 Muscle spasm of back: Secondary | ICD-10-CM | POA: Diagnosis not present

## 2023-10-06 DIAGNOSIS — M9904 Segmental and somatic dysfunction of sacral region: Secondary | ICD-10-CM | POA: Diagnosis not present

## 2023-10-13 ENCOUNTER — Other Ambulatory Visit: Payer: Self-pay | Admitting: Family Medicine

## 2023-10-29 DIAGNOSIS — R590 Localized enlarged lymph nodes: Secondary | ICD-10-CM | POA: Diagnosis not present

## 2023-10-29 DIAGNOSIS — H9313 Tinnitus, bilateral: Secondary | ICD-10-CM | POA: Diagnosis not present

## 2023-11-10 ENCOUNTER — Other Ambulatory Visit: Payer: Medicare Other

## 2023-11-10 ENCOUNTER — Ambulatory Visit: Payer: Medicare Other | Admitting: Family Medicine

## 2023-11-30 ENCOUNTER — Ambulatory Visit (INDEPENDENT_AMBULATORY_CARE_PROVIDER_SITE_OTHER): Payer: Medicare Other | Admitting: Family Medicine

## 2023-11-30 ENCOUNTER — Encounter: Payer: Self-pay | Admitting: Family Medicine

## 2023-11-30 VITALS — BP 140/77 | HR 54 | Temp 98.0°F | Ht 65.0 in | Wt 198.4 lb

## 2023-11-30 DIAGNOSIS — M179 Osteoarthritis of knee, unspecified: Secondary | ICD-10-CM

## 2023-11-30 DIAGNOSIS — I10 Essential (primary) hypertension: Secondary | ICD-10-CM | POA: Diagnosis not present

## 2023-11-30 DIAGNOSIS — Z23 Encounter for immunization: Secondary | ICD-10-CM | POA: Diagnosis not present

## 2023-11-30 DIAGNOSIS — E782 Mixed hyperlipidemia: Secondary | ICD-10-CM | POA: Diagnosis not present

## 2023-11-30 DIAGNOSIS — E559 Vitamin D deficiency, unspecified: Secondary | ICD-10-CM | POA: Diagnosis not present

## 2023-11-30 MED ORDER — OLMESARTAN MEDOXOMIL 40 MG PO TABS: 40.0000 mg | ORAL_TABLET | Freq: Every day | ORAL | 3 refills | Status: AC

## 2023-11-30 MED ORDER — ROSUVASTATIN CALCIUM 20 MG PO TABS
20.0000 mg | ORAL_TABLET | Freq: Every day | ORAL | 3 refills | Status: AC
Start: 2023-11-30 — End: ?

## 2023-11-30 MED ORDER — AMLODIPINE BESYLATE 5 MG PO TABS: 5.0000 mg | ORAL_TABLET | Freq: Every day | ORAL | 2 refills | Status: AC

## 2023-11-30 MED ORDER — FUROSEMIDE 20 MG PO TABS: 20.0000 mg | ORAL_TABLET | Freq: Every morning | ORAL | 0 refills | Status: DC

## 2023-11-30 MED ORDER — DICLOFENAC SODIUM 75 MG PO TBEC
DELAYED_RELEASE_TABLET | ORAL | 3 refills | Status: AC
Start: 2023-11-30 — End: ?

## 2023-11-30 MED ORDER — CLONAZEPAM 0.5 MG PO TABS: 0.5000 mg | ORAL_TABLET | Freq: Two times a day (BID) | ORAL | 0 refills | Status: AC | PRN

## 2023-11-30 NOTE — Progress Notes (Signed)
Subjective:  Patient ID: Bethany [name], female    DOB: 1951/09/25  Age: 72 y.o. MRN: [id number]  CC: Medical Management of Chronic Issues   HPI Bethany [name] presents for  follow-up of hypertension. Patient has no history of headache chest pain or shortness of breath or recent cough. Patient also denies symptoms of TIA such as focal numbness or weakness. Patient denies side effects from medication. States taking it regularly.   in for follow-up of elevated cholesterol. Doing well without complaints on current medication. Denies side effects of statin including myalgia and arthralgia and nausea. Currently no chest pain, shortness of breath or other cardiovascular related symptoms noted.     History Bethany [name] has a past medical history of Allergy (1997), Arthritis, GERD (gastroesophageal reflux disease), Hyperlipidemia, Hypertension, Left leg weakness, Obesity, Pinched nerve, Pneumonia, Prediabetes (08/04/2018), Seasonal allergies, and Sleep apnea.   She has a past surgical history that includes Abdominal hysterectomy; Tubal ligation; phlebectomy; Total knee arthroplasty (Right, 10/22/2013); Ganglion cyst excision; Breast surgery; Total knee arthroplasty (Left, 06/11/2015); Colonoscopy with propofol (N/A, 09/30/2022); and polypectomy (09/30/2022).   Her family history includes Arthritis in her brother; Diabetes in her sister; Early death in her father; Heart disease in her mother and sister.She reports that she has never smoked. She has never used smokeless tobacco. She reports that she does not drink alcohol and does not use drugs.  Current Outpatient Medications on File Prior to Visit  Medication Sig Dispense Refill   ASPIRIN LOW DOSE 81 MG tablet TAKE 1 TABLET EVERY DAY. SWALLOW WHOLE 90 tablet 3   augmented betamethasone dipropionate (DIPROLENE-AF) 0.05 % cream Apply topically 2 (two) times daily. At affected areas (avoid face and genitals) (Patient taking differently: Apply 1 application   topically 2 (two) times daily as needed (rash). At affected areas (avoid face and genitals)) 50 g 1   clobetasol (TEMOVATE) 0.05 % external solution Apply topically 2 (two) times daily. To scalp (Patient taking differently: Apply 1 Application topically daily. To scalp) 50 mL 3   fluticasone (FLONASE) 50 MCG/ACT nasal spray Place 2 sprays into both nostrils as needed. (Patient taking differently: Place 2 sprays into both nostrils daily as needed for allergies.) 48 g 1   Glycerin-Hypromellose-PEG 400 ([facility] RELIEF OP) Place 1 drop into both eyes daily.     meclizine (ANTIVERT) 25 MG tablet TAKE 1 TABLET THREE TIMES DAILY AS NEEDED  FOR  DIZZINESS 270 tablet 1   VENTOLIN HFA 108 (90 Base) MCG/ACT inhaler INHALE 2 PUFFS BY MOUTH EVERY 6 HOURS AS NEEDED FOR WHEEZING OR SHORTNESS OF BREATH 18 g 3   acyclovir (ZOVIRAX) 200 MG capsule TAKE 1 CAPSULE EVERY DAY AS NEEDED FOR FLAREUPS (Patient not taking: Reported on 11/30/2023) 90 capsule 0   No current facility-administered medications on file prior to visit.    ROS Review of Systems  Cardiovascular:  Positive for leg swelling.    Objective:  BP (!) 140/77   Pulse (!) 54   Temp 98 F (36.7 C)   Ht 5\' 5"  (1.651 m)   Wt 198 lb 6.4 oz (90 kg)   SpO2 100%   BMI 33.02 kg/m   BP Readings from Last 3 Encounters:  11/30/23 (!) 140/77  05/12/23 124/80  03/30/23 (!) 157/89    Wt Readings from Last 3 Encounters:  11/30/23 198 lb 6.4 oz (90 kg)  06/14/23 200 lb (90.7 kg)  05/12/23 200 lb (90.7 kg)     Physical Exam Constitutional:  General: She is not in acute distress.    Appearance: She is well-developed.  Cardiovascular:     Rate and Rhythm: Normal rate and regular rhythm.  Pulmonary:     Breath sounds: Normal breath sounds.  Musculoskeletal:        General: Tenderness (both knees) present. Normal range of motion.  Skin:    General: Skin is warm and dry.  Neurological:     Mental Status: She is alert and oriented to  person, place, and time.       Assessment & Plan:   Bethany [name] was seen today for medical management of chronic issues.  Diagnoses and all orders for this visit:  Need for influenza vaccination -     Flu Vaccine Trivalent High Dose (Fluad) -     CBC with Differential/Platelet -     CMP14+EGFR -     Lipid panel  Primary hypertension -     CBC with Differential/Platelet -     CMP14+EGFR -     Lipid panel -     olmesartan (BENICAR) 40 MG tablet; Take 1 tablet (40 mg total) by mouth daily.  Mixed hyperlipidemia -     CBC with Differential/Platelet -     CMP14+EGFR -     Lipid panel -     rosuvastatin (CRESTOR) 20 MG tablet; Take 1 tablet (20 mg total) by mouth daily.  Vitamin D deficiency -     CBC with Differential/Platelet -     CMP14+EGFR -     Lipid panel  Osteoarthritis of knee, unspecified laterality, unspecified osteoarthritis type -     diclofenac (VOLTAREN) 75 MG EC tablet; TAKE 1 TABLET TWICE DAILY FOR MUSCLE AND JOINT PAIN  Other orders -     amLODipine (NORVASC) 5 MG tablet; Take 1 tablet (5 mg total) by mouth daily. For blood pressure -     clonazePAM (KLONOPIN) 0.5 MG tablet; Take 1 tablet (0.5 mg total) by mouth 2 (two) times daily as needed for anxiety. -     furosemide (LASIX) 20 MG tablet; Take 1 tablet (20 mg total) by mouth every morning.   Allergies as of 11/30/2023       Reactions   Hctz [hydrochlorothiazide]    Hair loss        Medication List        Accurate as of November 30, 2023 11:59 PM. If you have any questions, ask your nurse or doctor.          STOP taking these medications    pregabalin 50 MG capsule Commonly known as: Lyrica Stopped by: Phung Kotas       TAKE these medications    acyclovir 200 MG capsule Commonly known as: ZOVIRAX TAKE 1 CAPSULE EVERY DAY AS NEEDED FOR FLAREUPS   amLODipine 5 MG tablet Commonly known as: NORVASC Take 1 tablet (5 mg total) by mouth daily. For blood pressure   Aspirin Low Dose  81 MG tablet Generic drug: aspirin EC TAKE 1 TABLET EVERY DAY. SWALLOW WHOLE   augmented betamethasone dipropionate 0.05 % cream Commonly known as: DIPROLENE-AF Apply topically 2 (two) times daily. At affected areas (avoid face and genitals) What changed:  how much to take when to take this reasons to take this   clobetasol 0.05 % external solution Commonly known as: TEMOVATE Apply topically 2 (two) times daily. To scalp What changed:  how much to take when to take this   clonazePAM 0.5 MG tablet Commonly known as: Scarlette Calico  Take 1 tablet (0.5 mg total) by mouth 2 (two) times daily as needed for anxiety.   [facility] Place 1 drop into both eyes daily.   diclofenac 75 MG EC tablet Commonly known as: VOLTAREN TAKE 1 TABLET TWICE DAILY FOR MUSCLE AND JOINT PAIN   fluticasone 50 MCG/ACT nasal spray Commonly known as: FLONASE Place 2 sprays into both nostrils as needed. What changed:  when to take this reasons to take this   furosemide 20 MG tablet Commonly known as: LASIX Take 1 tablet (20 mg total) by mouth every morning.   meclizine 25 MG tablet Commonly known as: ANTIVERT TAKE 1 TABLET THREE TIMES DAILY AS NEEDED  FOR  DIZZINESS   olmesartan 40 MG tablet Commonly known as: BENICAR Take 1 tablet (40 mg total) by mouth daily.   rosuvastatin 20 MG tablet Commonly known as: CRESTOR Take 1 tablet (20 mg total) by mouth daily.   Ventolin HFA 108 (90 Base) MCG/ACT inhaler Generic drug: albuterol INHALE 2 PUFFS BY MOUTH EVERY 6 HOURS AS NEEDED FOR WHEEZING OR SHORTNESS OF BREATH        Meds ordered this encounter  Medications   rosuvastatin (CRESTOR) 20 MG tablet    Sig: Take 1 tablet (20 mg total) by mouth daily.    Dispense:  90 tablet    Refill:  3   amLODipine (NORVASC) 5 MG tablet    Sig: Take 1 tablet (5 mg total) by mouth daily. For blood pressure    Dispense:  90 tablet    Refill:  2   clonazePAM (KLONOPIN) 0.5 MG tablet    Sig: Take 1  tablet (0.5 mg total) by mouth 2 (two) times daily as needed for anxiety.    Dispense:  60 tablet    Refill:  0   diclofenac (VOLTAREN) 75 MG EC tablet    Sig: TAKE 1 TABLET TWICE DAILY FOR MUSCLE AND JOINT PAIN    Dispense:  180 tablet    Refill:  3   furosemide (LASIX) 20 MG tablet    Sig: Take 1 tablet (20 mg total) by mouth every morning.    Dispense:  90 tablet    Refill:  0   olmesartan (BENICAR) 40 MG tablet    Sig: Take 1 tablet (40 mg total) by mouth daily.    Dispense:  90 tablet    Refill:  3      Follow-up: Return in about 6 months (around 05/30/2024).  Mechele Claude, M.D.

## 2023-12-01 LAB — CBC WITH DIFFERENTIAL/PLATELET
Basophils Absolute: 0.1 10*3/uL (ref 0.0–0.2)
Basos: 1 %
EOS (ABSOLUTE): 0.1 10*3/uL (ref 0.0–0.4)
Eos: 1 %
Hematocrit: 41.6 % (ref 34.0–46.6)
Hemoglobin: 13.3 g/dL (ref 11.1–15.9)
Immature Grans (Abs): 0 10*3/uL (ref 0.0–0.1)
Immature Granulocytes: 0 %
Lymphocytes Absolute: 1.6 10*3/uL (ref 0.7–3.1)
Lymphs: 22 %
MCH: 29.4 pg (ref 26.6–33.0)
MCHC: 32 g/dL (ref 31.5–35.7)
MCV: 92 fL (ref 79–97)
Monocytes Absolute: 0.6 10*3/uL (ref 0.1–0.9)
Monocytes: 8 %
Neutrophils Absolute: 5 10*3/uL (ref 1.4–7.0)
Neutrophils: 68 %
Platelets: 270 10*3/uL (ref 150–450)
RBC: 4.53 x10E6/uL (ref 3.77–5.28)
RDW: 12.9 % (ref 11.7–15.4)
WBC: 7.3 10*3/uL (ref 3.4–10.8)

## 2023-12-01 LAB — CMP14+EGFR
ALT: 16 [IU]/L (ref 0–32)
AST: 25 [IU]/L (ref 0–40)
Albumin: 4.5 g/dL (ref 3.8–4.8)
Alkaline Phosphatase: 82 [IU]/L (ref 44–121)
BUN/Creatinine Ratio: 9 — ABNORMAL LOW (ref 12–28)
BUN: 8 mg/dL (ref 8–27)
Bilirubin Total: 0.9 mg/dL (ref 0.0–1.2)
CO2: 26 mmol/L (ref 20–29)
Calcium: 10 mg/dL (ref 8.7–10.3)
Chloride: 107 mmol/L — ABNORMAL HIGH (ref 96–106)
Creatinine, Ser: 0.87 mg/dL (ref 0.57–1.00)
Globulin, Total: 2.3 g/dL (ref 1.5–4.5)
Glucose: 89 mg/dL (ref 70–99)
Potassium: 4.1 mmol/L (ref 3.5–5.2)
Sodium: 145 mmol/L — ABNORMAL HIGH (ref 134–144)
Total Protein: 6.8 g/dL (ref 6.0–8.5)
eGFR: 70 mL/min/{1.73_m2} (ref >59)

## 2023-12-01 LAB — LIPID PANEL
Chol/HDL Ratio: 2.9 {ratio} (ref 0.0–4.4)
Cholesterol, Total: 152 mg/dL (ref 100–199)
HDL: 52 mg/dL (ref >39)
LDL Chol Calc (NIH): 80 mg/dL (ref 0–99)
Triglycerides: 113 mg/dL (ref 0–149)
VLDL Cholesterol Cal: 20 mg/dL (ref 5–40)

## 2023-12-02 ENCOUNTER — Encounter: Payer: Self-pay | Admitting: Family Medicine

## 2023-12-04 NOTE — Progress Notes (Signed)
Hello Tamyra,  Your lab result is normal and/or stable.Some minor variations that are not significant are commonly marked abnormal, but do not represent any medical problem for you.  Best regards, Elena Cothern, M.D.

## 2023-12-15 DIAGNOSIS — M9903 Segmental and somatic dysfunction of lumbar region: Secondary | ICD-10-CM | POA: Diagnosis not present

## 2023-12-15 DIAGNOSIS — M62830 Muscle spasm of back: Secondary | ICD-10-CM | POA: Diagnosis not present

## 2023-12-15 DIAGNOSIS — M9904 Segmental and somatic dysfunction of sacral region: Secondary | ICD-10-CM | POA: Diagnosis not present

## 2023-12-15 DIAGNOSIS — M9901 Segmental and somatic dysfunction of cervical region: Secondary | ICD-10-CM | POA: Diagnosis not present

## 2023-12-19 DIAGNOSIS — M9901 Segmental and somatic dysfunction of cervical region: Secondary | ICD-10-CM | POA: Diagnosis not present

## 2023-12-19 DIAGNOSIS — M9903 Segmental and somatic dysfunction of lumbar region: Secondary | ICD-10-CM | POA: Diagnosis not present

## 2023-12-19 DIAGNOSIS — M62830 Muscle spasm of back: Secondary | ICD-10-CM | POA: Diagnosis not present

## 2023-12-19 DIAGNOSIS — M9904 Segmental and somatic dysfunction of sacral region: Secondary | ICD-10-CM | POA: Diagnosis not present

## 2023-12-25 ENCOUNTER — Other Ambulatory Visit: Payer: Self-pay | Admitting: Family Medicine

## 2023-12-27 DIAGNOSIS — M62830 Muscle spasm of back: Secondary | ICD-10-CM | POA: Diagnosis not present

## 2023-12-27 DIAGNOSIS — M9904 Segmental and somatic dysfunction of sacral region: Secondary | ICD-10-CM | POA: Diagnosis not present

## 2023-12-27 DIAGNOSIS — M9903 Segmental and somatic dysfunction of lumbar region: Secondary | ICD-10-CM | POA: Diagnosis not present

## 2023-12-27 DIAGNOSIS — M9901 Segmental and somatic dysfunction of cervical region: Secondary | ICD-10-CM | POA: Diagnosis not present

## 2024-01-04 DIAGNOSIS — M9904 Segmental and somatic dysfunction of sacral region: Secondary | ICD-10-CM | POA: Diagnosis not present

## 2024-01-04 DIAGNOSIS — M62830 Muscle spasm of back: Secondary | ICD-10-CM | POA: Diagnosis not present

## 2024-01-04 DIAGNOSIS — M9901 Segmental and somatic dysfunction of cervical region: Secondary | ICD-10-CM | POA: Diagnosis not present

## 2024-01-04 DIAGNOSIS — M9903 Segmental and somatic dysfunction of lumbar region: Secondary | ICD-10-CM | POA: Diagnosis not present

## 2024-01-18 DIAGNOSIS — M9903 Segmental and somatic dysfunction of lumbar region: Secondary | ICD-10-CM | POA: Diagnosis not present

## 2024-01-18 DIAGNOSIS — M9901 Segmental and somatic dysfunction of cervical region: Secondary | ICD-10-CM | POA: Diagnosis not present

## 2024-01-18 DIAGNOSIS — M62830 Muscle spasm of back: Secondary | ICD-10-CM | POA: Diagnosis not present

## 2024-01-18 DIAGNOSIS — M9904 Segmental and somatic dysfunction of sacral region: Secondary | ICD-10-CM | POA: Diagnosis not present

## 2024-01-30 DIAGNOSIS — M9901 Segmental and somatic dysfunction of cervical region: Secondary | ICD-10-CM | POA: Diagnosis not present

## 2024-01-30 DIAGNOSIS — M9904 Segmental and somatic dysfunction of sacral region: Secondary | ICD-10-CM | POA: Diagnosis not present

## 2024-01-30 DIAGNOSIS — M62830 Muscle spasm of back: Secondary | ICD-10-CM | POA: Diagnosis not present

## 2024-01-30 DIAGNOSIS — M9903 Segmental and somatic dysfunction of lumbar region: Secondary | ICD-10-CM | POA: Diagnosis not present

## 2024-01-31 DIAGNOSIS — M62830 Muscle spasm of back: Secondary | ICD-10-CM | POA: Diagnosis not present

## 2024-01-31 DIAGNOSIS — M9904 Segmental and somatic dysfunction of sacral region: Secondary | ICD-10-CM | POA: Diagnosis not present

## 2024-01-31 DIAGNOSIS — M9901 Segmental and somatic dysfunction of cervical region: Secondary | ICD-10-CM | POA: Diagnosis not present

## 2024-01-31 DIAGNOSIS — M9903 Segmental and somatic dysfunction of lumbar region: Secondary | ICD-10-CM | POA: Diagnosis not present

## 2024-03-08 ENCOUNTER — Other Ambulatory Visit: Payer: Self-pay | Admitting: Family Medicine

## 2024-03-13 DIAGNOSIS — M62830 Muscle spasm of back: Secondary | ICD-10-CM | POA: Diagnosis not present

## 2024-03-13 DIAGNOSIS — M9904 Segmental and somatic dysfunction of sacral region: Secondary | ICD-10-CM | POA: Diagnosis not present

## 2024-03-13 DIAGNOSIS — M9903 Segmental and somatic dysfunction of lumbar region: Secondary | ICD-10-CM | POA: Diagnosis not present

## 2024-03-13 DIAGNOSIS — M9901 Segmental and somatic dysfunction of cervical region: Secondary | ICD-10-CM | POA: Diagnosis not present

## 2024-04-26 DIAGNOSIS — M62830 Muscle spasm of back: Secondary | ICD-10-CM | POA: Diagnosis not present

## 2024-04-26 DIAGNOSIS — M9901 Segmental and somatic dysfunction of cervical region: Secondary | ICD-10-CM | POA: Diagnosis not present

## 2024-04-26 DIAGNOSIS — M9904 Segmental and somatic dysfunction of sacral region: Secondary | ICD-10-CM | POA: Diagnosis not present

## 2024-04-26 DIAGNOSIS — M9903 Segmental and somatic dysfunction of lumbar region: Secondary | ICD-10-CM | POA: Diagnosis not present

## 2024-04-30 DIAGNOSIS — M62830 Muscle spasm of back: Secondary | ICD-10-CM | POA: Diagnosis not present

## 2024-04-30 DIAGNOSIS — M9904 Segmental and somatic dysfunction of sacral region: Secondary | ICD-10-CM | POA: Diagnosis not present

## 2024-04-30 DIAGNOSIS — M9903 Segmental and somatic dysfunction of lumbar region: Secondary | ICD-10-CM | POA: Diagnosis not present

## 2024-04-30 DIAGNOSIS — M9901 Segmental and somatic dysfunction of cervical region: Secondary | ICD-10-CM | POA: Diagnosis not present

## 2024-05-20 ENCOUNTER — Other Ambulatory Visit: Payer: Self-pay | Admitting: Family Medicine

## 2024-05-28 DIAGNOSIS — M9902 Segmental and somatic dysfunction of thoracic region: Secondary | ICD-10-CM | POA: Diagnosis not present

## 2024-05-28 DIAGNOSIS — M9901 Segmental and somatic dysfunction of cervical region: Secondary | ICD-10-CM | POA: Diagnosis not present

## 2024-05-28 DIAGNOSIS — M9903 Segmental and somatic dysfunction of lumbar region: Secondary | ICD-10-CM | POA: Diagnosis not present

## 2024-05-28 DIAGNOSIS — M62830 Muscle spasm of back: Secondary | ICD-10-CM | POA: Diagnosis not present

## 2024-05-30 ENCOUNTER — Encounter: Payer: Self-pay | Admitting: Family Medicine

## 2024-05-30 ENCOUNTER — Other Ambulatory Visit: Payer: Medicare Other

## 2024-05-30 ENCOUNTER — Ambulatory Visit: Payer: Medicare Other | Admitting: Family Medicine

## 2024-06-12 DIAGNOSIS — M9902 Segmental and somatic dysfunction of thoracic region: Secondary | ICD-10-CM | POA: Diagnosis not present

## 2024-06-12 DIAGNOSIS — M9903 Segmental and somatic dysfunction of lumbar region: Secondary | ICD-10-CM | POA: Diagnosis not present

## 2024-06-12 DIAGNOSIS — M9901 Segmental and somatic dysfunction of cervical region: Secondary | ICD-10-CM | POA: Diagnosis not present

## 2024-06-12 DIAGNOSIS — M62830 Muscle spasm of back: Secondary | ICD-10-CM | POA: Diagnosis not present

## 2024-06-18 DIAGNOSIS — M9902 Segmental and somatic dysfunction of thoracic region: Secondary | ICD-10-CM | POA: Diagnosis not present

## 2024-06-18 DIAGNOSIS — M9903 Segmental and somatic dysfunction of lumbar region: Secondary | ICD-10-CM | POA: Diagnosis not present

## 2024-06-18 DIAGNOSIS — M62830 Muscle spasm of back: Secondary | ICD-10-CM | POA: Diagnosis not present

## 2024-06-18 DIAGNOSIS — M9901 Segmental and somatic dysfunction of cervical region: Secondary | ICD-10-CM | POA: Diagnosis not present

## 2024-06-21 DIAGNOSIS — M9903 Segmental and somatic dysfunction of lumbar region: Secondary | ICD-10-CM | POA: Diagnosis not present

## 2024-06-21 DIAGNOSIS — M9901 Segmental and somatic dysfunction of cervical region: Secondary | ICD-10-CM | POA: Diagnosis not present

## 2024-06-21 DIAGNOSIS — M62830 Muscle spasm of back: Secondary | ICD-10-CM | POA: Diagnosis not present

## 2024-06-21 DIAGNOSIS — M9902 Segmental and somatic dysfunction of thoracic region: Secondary | ICD-10-CM | POA: Diagnosis not present

## 2024-06-25 DIAGNOSIS — M62830 Muscle spasm of back: Secondary | ICD-10-CM | POA: Diagnosis not present

## 2024-06-25 DIAGNOSIS — M9903 Segmental and somatic dysfunction of lumbar region: Secondary | ICD-10-CM | POA: Diagnosis not present

## 2024-06-25 DIAGNOSIS — M9901 Segmental and somatic dysfunction of cervical region: Secondary | ICD-10-CM | POA: Diagnosis not present

## 2024-06-25 DIAGNOSIS — M9902 Segmental and somatic dysfunction of thoracic region: Secondary | ICD-10-CM | POA: Diagnosis not present

## 2024-06-28 DIAGNOSIS — M62830 Muscle spasm of back: Secondary | ICD-10-CM | POA: Diagnosis not present

## 2024-06-28 DIAGNOSIS — M9902 Segmental and somatic dysfunction of thoracic region: Secondary | ICD-10-CM | POA: Diagnosis not present

## 2024-06-28 DIAGNOSIS — M9903 Segmental and somatic dysfunction of lumbar region: Secondary | ICD-10-CM | POA: Diagnosis not present

## 2024-06-28 DIAGNOSIS — M9901 Segmental and somatic dysfunction of cervical region: Secondary | ICD-10-CM | POA: Diagnosis not present

## 2024-07-04 DIAGNOSIS — M9902 Segmental and somatic dysfunction of thoracic region: Secondary | ICD-10-CM | POA: Diagnosis not present

## 2024-07-04 DIAGNOSIS — M9901 Segmental and somatic dysfunction of cervical region: Secondary | ICD-10-CM | POA: Diagnosis not present

## 2024-07-04 DIAGNOSIS — M62830 Muscle spasm of back: Secondary | ICD-10-CM | POA: Diagnosis not present

## 2024-07-04 DIAGNOSIS — M9903 Segmental and somatic dysfunction of lumbar region: Secondary | ICD-10-CM | POA: Diagnosis not present

## 2024-07-07 ENCOUNTER — Other Ambulatory Visit: Payer: Self-pay | Admitting: Family Medicine

## 2024-07-09 ENCOUNTER — Encounter: Payer: Self-pay | Admitting: Family Medicine

## 2024-07-09 NOTE — Telephone Encounter (Signed)
 Attempted to contact pt ph# not working Letter mailed

## 2024-07-09 NOTE — Telephone Encounter (Signed)
 Stacks NTBS for 6 mos FU NO RF sent to mail order pharmacy

## 2024-07-12 DIAGNOSIS — M9903 Segmental and somatic dysfunction of lumbar region: Secondary | ICD-10-CM | POA: Diagnosis not present

## 2024-07-12 DIAGNOSIS — M62830 Muscle spasm of back: Secondary | ICD-10-CM | POA: Diagnosis not present

## 2024-07-12 DIAGNOSIS — M9901 Segmental and somatic dysfunction of cervical region: Secondary | ICD-10-CM | POA: Diagnosis not present

## 2024-07-12 DIAGNOSIS — M9902 Segmental and somatic dysfunction of thoracic region: Secondary | ICD-10-CM | POA: Diagnosis not present

## 2024-07-19 DIAGNOSIS — M9901 Segmental and somatic dysfunction of cervical region: Secondary | ICD-10-CM | POA: Diagnosis not present

## 2024-07-19 DIAGNOSIS — M9902 Segmental and somatic dysfunction of thoracic region: Secondary | ICD-10-CM | POA: Diagnosis not present

## 2024-07-19 DIAGNOSIS — M9903 Segmental and somatic dysfunction of lumbar region: Secondary | ICD-10-CM | POA: Diagnosis not present

## 2024-07-19 DIAGNOSIS — M62830 Muscle spasm of back: Secondary | ICD-10-CM | POA: Diagnosis not present

## 2024-07-26 DIAGNOSIS — M9902 Segmental and somatic dysfunction of thoracic region: Secondary | ICD-10-CM | POA: Diagnosis not present

## 2024-07-26 DIAGNOSIS — M62830 Muscle spasm of back: Secondary | ICD-10-CM | POA: Diagnosis not present

## 2024-07-26 DIAGNOSIS — M9903 Segmental and somatic dysfunction of lumbar region: Secondary | ICD-10-CM | POA: Diagnosis not present

## 2024-07-26 DIAGNOSIS — M9901 Segmental and somatic dysfunction of cervical region: Secondary | ICD-10-CM | POA: Diagnosis not present

## 2024-08-03 ENCOUNTER — Other Ambulatory Visit: Payer: Self-pay | Admitting: Family Medicine

## 2024-08-03 ENCOUNTER — Encounter: Payer: Self-pay | Admitting: Family Medicine

## 2024-08-03 NOTE — Telephone Encounter (Signed)
 Stacks NTBS for 6 mos FU NO RF sent to mail order pharmacy

## 2024-08-03 NOTE — Telephone Encounter (Signed)
 Letter mailed. No. Not working

## 2024-08-13 DIAGNOSIS — M9901 Segmental and somatic dysfunction of cervical region: Secondary | ICD-10-CM | POA: Diagnosis not present

## 2024-08-13 DIAGNOSIS — M9902 Segmental and somatic dysfunction of thoracic region: Secondary | ICD-10-CM | POA: Diagnosis not present

## 2024-08-13 DIAGNOSIS — M9903 Segmental and somatic dysfunction of lumbar region: Secondary | ICD-10-CM | POA: Diagnosis not present

## 2024-08-13 DIAGNOSIS — M62830 Muscle spasm of back: Secondary | ICD-10-CM | POA: Diagnosis not present

## 2024-08-20 DIAGNOSIS — M62830 Muscle spasm of back: Secondary | ICD-10-CM | POA: Diagnosis not present

## 2024-08-20 DIAGNOSIS — M9901 Segmental and somatic dysfunction of cervical region: Secondary | ICD-10-CM | POA: Diagnosis not present

## 2024-08-20 DIAGNOSIS — M9903 Segmental and somatic dysfunction of lumbar region: Secondary | ICD-10-CM | POA: Diagnosis not present

## 2024-08-20 DIAGNOSIS — M9902 Segmental and somatic dysfunction of thoracic region: Secondary | ICD-10-CM | POA: Diagnosis not present

## 2024-08-28 DIAGNOSIS — M9901 Segmental and somatic dysfunction of cervical region: Secondary | ICD-10-CM | POA: Diagnosis not present

## 2024-08-28 DIAGNOSIS — M9903 Segmental and somatic dysfunction of lumbar region: Secondary | ICD-10-CM | POA: Diagnosis not present

## 2024-08-28 DIAGNOSIS — M62830 Muscle spasm of back: Secondary | ICD-10-CM | POA: Diagnosis not present

## 2024-08-28 DIAGNOSIS — M9902 Segmental and somatic dysfunction of thoracic region: Secondary | ICD-10-CM | POA: Diagnosis not present

## 2024-09-11 DIAGNOSIS — M9901 Segmental and somatic dysfunction of cervical region: Secondary | ICD-10-CM | POA: Diagnosis not present

## 2024-09-11 DIAGNOSIS — M62830 Muscle spasm of back: Secondary | ICD-10-CM | POA: Diagnosis not present

## 2024-09-11 DIAGNOSIS — M9903 Segmental and somatic dysfunction of lumbar region: Secondary | ICD-10-CM | POA: Diagnosis not present

## 2024-09-11 DIAGNOSIS — M9902 Segmental and somatic dysfunction of thoracic region: Secondary | ICD-10-CM | POA: Diagnosis not present

## 2024-09-17 DIAGNOSIS — M9901 Segmental and somatic dysfunction of cervical region: Secondary | ICD-10-CM | POA: Diagnosis not present

## 2024-09-17 DIAGNOSIS — M62830 Muscle spasm of back: Secondary | ICD-10-CM | POA: Diagnosis not present

## 2024-09-17 DIAGNOSIS — M9902 Segmental and somatic dysfunction of thoracic region: Secondary | ICD-10-CM | POA: Diagnosis not present

## 2024-09-17 DIAGNOSIS — M9903 Segmental and somatic dysfunction of lumbar region: Secondary | ICD-10-CM | POA: Diagnosis not present

## 2024-09-20 DIAGNOSIS — M62830 Muscle spasm of back: Secondary | ICD-10-CM | POA: Diagnosis not present

## 2024-09-20 DIAGNOSIS — M9901 Segmental and somatic dysfunction of cervical region: Secondary | ICD-10-CM | POA: Diagnosis not present

## 2024-09-20 DIAGNOSIS — M9903 Segmental and somatic dysfunction of lumbar region: Secondary | ICD-10-CM | POA: Diagnosis not present

## 2024-09-20 DIAGNOSIS — M9902 Segmental and somatic dysfunction of thoracic region: Secondary | ICD-10-CM | POA: Diagnosis not present

## 2024-11-01 ENCOUNTER — Other Ambulatory Visit: Payer: Self-pay | Admitting: *Deleted

## 2024-11-05 ENCOUNTER — Other Ambulatory Visit: Payer: Self-pay | Admitting: Family Medicine

## 2024-11-12 ENCOUNTER — Ambulatory Visit: Admitting: Family Medicine

## 2024-11-13 ENCOUNTER — Encounter: Payer: Self-pay | Admitting: Family Medicine
